# Patient Record
Sex: Male | Born: 1940
Health system: Southern US, Community
[De-identification: ages and names within clinical notes are randomized; demographics above are authoritative.]

## PROBLEM LIST (undated history)

## (undated) DIAGNOSIS — Z9889 Other specified postprocedural states: Secondary | ICD-10-CM

## (undated) DIAGNOSIS — C801 Malignant (primary) neoplasm, unspecified: Secondary | ICD-10-CM

## (undated) DIAGNOSIS — M5030 Other cervical disc degeneration, unspecified cervical region: Secondary | ICD-10-CM

## (undated) DIAGNOSIS — R112 Nausea with vomiting, unspecified: Secondary | ICD-10-CM

## (undated) DIAGNOSIS — E785 Hyperlipidemia, unspecified: Secondary | ICD-10-CM

## (undated) DIAGNOSIS — E039 Hypothyroidism, unspecified: Secondary | ICD-10-CM

## (undated) HISTORY — DX: Hypothyroidism, unspecified: E03.9

## (undated) HISTORY — DX: Other cervical disc degeneration, unspecified cervical region: M50.30

## (undated) HISTORY — DX: Hyperlipidemia, unspecified: E78.5

## (undated) HISTORY — PX: TONSILLECTOMY: SUR1361

---
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
## 2007-12-18 ENCOUNTER — Encounter: Payer: Self-pay | Admitting: Internal Medicine

## 2008-03-03 ENCOUNTER — Ambulatory Visit: Payer: Self-pay | Admitting: Internal Medicine

## 2008-03-03 DIAGNOSIS — E785 Hyperlipidemia, unspecified: Secondary | ICD-10-CM | POA: Insufficient documentation

## 2008-03-03 DIAGNOSIS — J309 Allergic rhinitis, unspecified: Secondary | ICD-10-CM | POA: Insufficient documentation

## 2008-04-14 ENCOUNTER — Ambulatory Visit: Payer: Self-pay | Admitting: Internal Medicine

## 2008-04-14 LAB — CONVERTED CEMR LAB
HDL goal, serum: 40 mg/dL
LDL Goal: 160 mg/dL

## 2008-06-16 ENCOUNTER — Ambulatory Visit: Payer: Self-pay | Admitting: Internal Medicine

## 2008-10-15 ENCOUNTER — Ambulatory Visit: Payer: Self-pay | Admitting: Internal Medicine

## 2008-10-15 DIAGNOSIS — E039 Hypothyroidism, unspecified: Secondary | ICD-10-CM

## 2008-10-15 LAB — CONVERTED CEMR LAB
ALT: 25 units/L (ref 0–53)
AST: 24 units/L (ref 0–37)
Alkaline Phosphatase: 41 units/L (ref 39–117)
Bilirubin, Direct: 0.2 mg/dL (ref 0.0–0.3)
CO2: 28 meq/L (ref 19–32)
Chloride: 108 meq/L (ref 96–112)
LDL Cholesterol: 80 mg/dL (ref 0–99)
Potassium: 4.4 meq/L (ref 3.5–5.1)
Sodium: 143 meq/L (ref 135–145)
Total Bilirubin: 1.1 mg/dL (ref 0.3–1.2)
Total CHOL/HDL Ratio: 4
Total Protein: 6.8 g/dL (ref 6.0–8.3)
Triglycerides: 98 mg/dL (ref 0.0–149.0)

## 2008-10-17 ENCOUNTER — Telehealth: Payer: Self-pay | Admitting: Internal Medicine

## 2008-12-04 ENCOUNTER — Ambulatory Visit: Payer: Self-pay | Admitting: Internal Medicine

## 2008-12-04 LAB — CONVERTED CEMR LAB: Cholesterol, target level: 200 mg/dL

## 2009-02-02 ENCOUNTER — Telehealth: Payer: Self-pay | Admitting: Internal Medicine

## 2009-02-23 ENCOUNTER — Encounter: Payer: Self-pay | Admitting: Internal Medicine

## 2009-02-23 ENCOUNTER — Ambulatory Visit: Payer: Self-pay | Admitting: Internal Medicine

## 2009-02-23 LAB — CONVERTED CEMR LAB
Inflenza A Ag: NEGATIVE
Influenza B Ag: NEGATIVE

## 2009-03-04 ENCOUNTER — Ambulatory Visit: Payer: Self-pay | Admitting: Internal Medicine

## 2009-04-15 ENCOUNTER — Telehealth: Payer: Self-pay | Admitting: Internal Medicine

## 2009-04-20 ENCOUNTER — Ambulatory Visit: Payer: Self-pay | Admitting: Internal Medicine

## 2009-04-20 LAB — CONVERTED CEMR LAB
Albumin: 3.7 g/dL (ref 3.5–5.2)
BUN: 10 mg/dL (ref 6–23)
Calcium: 8.6 mg/dL (ref 8.4–10.5)
Cholesterol: 128 mg/dL (ref 0–200)
Creatinine, Ser: 1 mg/dL (ref 0.4–1.5)
GFR calc non Af Amer: 79.08 mL/min (ref >60)
Glucose, Bld: 100 mg/dL — ABNORMAL HIGH (ref 70–99)
HDL: 34.7 mg/dL — ABNORMAL LOW (ref >39.00)
TSH: 1.66 microintl units/mL (ref 0.35–5.50)
Total Bilirubin: 1 mg/dL (ref 0.3–1.2)
Triglycerides: 117 mg/dL (ref 0.0–149.0)
VLDL: 23.4 mg/dL (ref 0.0–40.0)

## 2009-10-06 ENCOUNTER — Ambulatory Visit: Payer: Self-pay | Admitting: Internal Medicine

## 2009-10-06 DIAGNOSIS — B354 Tinea corporis: Secondary | ICD-10-CM | POA: Insufficient documentation

## 2009-10-06 DIAGNOSIS — M169 Osteoarthritis of hip, unspecified: Secondary | ICD-10-CM | POA: Insufficient documentation

## 2009-11-10 ENCOUNTER — Ambulatory Visit: Payer: Self-pay | Admitting: Internal Medicine

## 2009-11-10 LAB — CONVERTED CEMR LAB
Albumin: 3.8 g/dL (ref 3.5–5.2)
Basophils Absolute: 0.1 10*3/uL (ref 0.0–0.1)
CO2: 25 meq/L (ref 19–32)
Calcium: 8.9 mg/dL (ref 8.4–10.5)
Cholesterol: 174 mg/dL (ref 0–200)
Creatinine, Ser: 1 mg/dL (ref 0.4–1.5)
HCT: 44.8 % (ref 39.0–52.0)
HDL: 38.2 mg/dL — ABNORMAL LOW (ref >39.00)
Hemoglobin: 15.2 g/dL (ref 13.0–17.0)
Lymphs Abs: 1.9 10*3/uL (ref 0.7–4.0)
MCHC: 34 g/dL (ref 30.0–36.0)
MCV: 92.7 fL (ref 78.0–100.0)
Monocytes Absolute: 0.6 10*3/uL (ref 0.1–1.0)
Monocytes Relative: 7.1 % (ref 3.0–12.0)
Neutro Abs: 5.7 10*3/uL (ref 1.4–7.7)
PSA, Free Pct: 17 — ABNORMAL LOW (ref >25)
RDW: 14.1 % (ref 11.5–14.6)
Total CHOL/HDL Ratio: 5
Total Protein: 6.5 g/dL (ref 6.0–8.3)
VLDL: 20.6 mg/dL (ref 0.0–40.0)

## 2009-12-17 ENCOUNTER — Telehealth: Payer: Self-pay | Admitting: Internal Medicine

## 2010-02-09 ENCOUNTER — Encounter (INDEPENDENT_AMBULATORY_CARE_PROVIDER_SITE_OTHER): Payer: Self-pay | Admitting: *Deleted

## 2010-02-09 ENCOUNTER — Ambulatory Visit
Admission: RE | Admit: 2010-02-09 | Discharge: 2010-02-09 | Payer: Self-pay | Source: Home / Self Care | Attending: Gastroenterology | Admitting: Gastroenterology

## 2010-02-23 ENCOUNTER — Other Ambulatory Visit: Payer: Self-pay | Admitting: Gastroenterology

## 2010-02-23 ENCOUNTER — Ambulatory Visit
Admission: RE | Admit: 2010-02-23 | Discharge: 2010-02-23 | Payer: Self-pay | Source: Home / Self Care | Attending: Gastroenterology | Admitting: Gastroenterology

## 2010-02-23 LAB — HM COLONOSCOPY

## 2010-03-02 ENCOUNTER — Encounter: Payer: Self-pay | Admitting: Gastroenterology

## 2010-03-11 NOTE — Procedures (Addendum)
Summary: Colonoscopy  Patient: Keith [name] Note: All result statuses are Final unless otherwise noted.  Tests: (1) Colonoscopy (COL)   COL Colonoscopy           DONE     [facility]     [address]     [place], [place]  [address]           COLONOSCOPY PROCEDURE REPORT           PATIENT:  Keith, [name]  MR#:  [id number]     BIRTHDATE:  [birth year], 69 yrs. old  GENDER:  male     ENDOSCOPIST:  Judie Petit T. Russella Dar, MD, [facility]     Referred by:  [name], M.D.     PROCEDURE DATE:  02/23/2010     PROCEDURE:  Colonoscopy with snare polypectomy     ASA CLASS:  Class II     INDICATIONS:  1) Routine Risk Screening     MEDICATIONS:   Fentanyl 50 mcg IV, Versed 5 mg IV     DESCRIPTION OF PROCEDURE:   After the risks benefits and     alternatives of the procedure were thoroughly explained, informed     consent was obtained.  Digital rectal exam was performed and     revealed no abnormalities.   The LB [id number]-[id number] endoscope     was introduced through the anus and advanced to the cecum, which     was identified by both the appendix and ileocecal valve, without     limitations.  The quality of the prep was excellent, using     MoviPrep.  The instrument was then slowly withdrawn as the colon     was fully examined.     <<PROCEDUREIMAGES>>     FINDINGS:  A sessile polyp was found in the rectum. It was 5 mm in     size. Polyp was snared without cautery. Retrieval was successful.     Moderate diverticulosis was found in the sigmoid colon. A normal     appearing cecum, ileocecal valve, and appendiceal orifice were     identified. The ascending, hepatic flexure, transverse, splenic     flexure, descending appeared unremarkable. Retroflexed views in     the rectum revealed no abnormalities. The time to cecum =  5.25     minutes. The scope was then withdrawn (time =  9.75  min) from the     patient and the procedure completed.           COMPLICATIONS:  None        ENDOSCOPIC IMPRESSION:     1) 5 mm sessile polyp in the rectum     2) Moderate diverticulosis in the sigmoid colon           RECOMMENDATIONS:     1) Await pathology results     2) High fiber diet with liberal fluid intake.     3) If the polyp is adenomatous (pre-cancerous), colonoscopy in 5     years. Otherwise follow colorectal cancer screening guidelines for     "routine risk" patients with colonoscopy in 10 years.           Venita Lick. Russella Dar, MD, [facility]           n.     eSIGNED:   Venita Lick. Stark at 02/23/2010 12:08 PM           [place], [phone]  Note: An exclamation mark (!) indicates a result  that was not dispersed into the flowsheet. Document Creation Date: 02/23/2010 12:09 PM _______________________________________________________________________  (1) Order result status: Final Collection or observation date-time: 02/23/2010 12:04 Requested date-time:  Receipt date-time:  Reported date-time:  Referring Physician:   Ordering Physician: Claudette Head [phone]) Specimen Source:  Source: Launa Grill Order Number: [id number] Lab site:   Appended Document: Colonoscopy     Procedures Next Due Date:    Colonoscopy: 02/2015

## 2010-03-11 NOTE — Progress Notes (Signed)
Summary: request for rx  Phone Note Call from Patient   Summary of Call: pt requesting rx for Zpak for "stomach virus". Pt c/o fever, vomiting, diarrhea since last night. Pt stated he took OTC Pepto, no relief, also taking Tylenol. Please advise Initial call taken by: Sydell Axon  April 15, 2009 12:33 PM  Follow-up for Phone Call        not zpak but will for cipro, done Follow-up by: [name] MD,  April 15, 2009 12:51 PM  Additional Follow-up for Phone Call Additional follow up Details #1::        Pt informed  Additional Follow-up by: Lamar Sprinkles, CMA,  April 15, 2009 1:54 PM    New/Updated Medications: CIPRO 500 MG TAB (CIPROFLOXACIN HCL) Take 1 tablet by mouth morning and night X 10 days Prescriptions: CIPRO 500 MG TAB (CIPROFLOXACIN HCL) Take 1 tablet by mouth morning and night X 10 days  #20 x 1   Entered and Authorized by:   [name] MD   Signed by:   [name] MD on 04/15/2009   Method used:   Electronically to        [facility]  [address] # [id number]* (retail)       [address]       [place], [place]  [address]       Ph: [phone] or [phone]       Fax: [phone]   RxID:   [id number]

## 2010-03-11 NOTE — Letter (Signed)
Summary: Results Follow-up Letter  [facility]  [address] [place], [place] [address]   Phone: [phone]  Fax: [phone]    04/20/2009  [address] [place], [place]  [address]  Dear Mr. COOLMAN,   The following are the results of your recent test(s):  Test     Result     Kidney/liver   normal Thyroid     normal   _________________________________________________________  Please call for an appointment as directed _________________________________________________________ _________________________________________________________ _________________________________________________________  Sincerely,  Sanda Linger MD [facility]

## 2010-03-11 NOTE — Progress Notes (Signed)
  Phone Note Refill Request Message from:  Fax from Pharmacy on December 17, 2009 10:22 AM  Refills Requested: Medication #1:  SYNTHROID 125 MCG TABS Take 1 tablet by mouth once a day   Dosage confirmed as above?Dosage Confirmed   Supply Requested: 3 months Initial call taken by: Rock Nephew CMA,  December 17, 2009 10:22 AM    Prescriptions: SYNTHROID 125 MCG TABS (LEVOTHYROXINE SODIUM) Take 1 tablet by mouth once a day  #90 x 2   Entered by:   Rock Nephew CMA   Authorized by:   [name] MD   Signed by:   Rock Nephew CMA on 12/17/2009   Method used:   Faxed to ...       [facility] (mail-order)             , [place]         Ph: [phone]       Fax: [phone]   RxID:   [id number]

## 2010-03-11 NOTE — Assessment & Plan Note (Signed)
Summary: f/u appt//#/cd   Vital Signs:  Patient profile:   70 year old male Height:      69 inches Weight:      198 pounds BMI:     29.35 O2 Sat:      97 % on Room air Temp:     97.1 degrees F oral Pulse rate:   60 / minute Pulse rhythm:   regular Resp:     16 per minute BP sitting:   114 / 70  (left arm) Cuff size:   large  Vitals Entered By: Rock Nephew CMA (October 06, 2009 11:30 AM)  Nutrition Counseling: Patient's BMI is greater than 25 and therefore counseled on weight management options.  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 The pain rarely radiates into his RLE but he does not have N/W/T in his legs.  Also, he has a rash around his anus and buttocks.  Preventive Screening-Counseling & Management  Alcohol-Tobacco     Alcohol drinks/day: 0     Smoking Status: never     Passive Smoke Exposure: yes     Tobacco Counseling: not indicated; no tobacco use  Hep-HIV-STD-Contraception     Hepatitis Risk: no risk noted     HIV Risk: no     STD Risk: no risk noted  Medications Prior to Update: 1)  Synthroid 125 Mcg Tabs (Levothyroxine Sodium) .... Take 1 Tablet By Mouth Once A Day 2)  Multivitamin 3)  Veramyst 27.5 Mcg/spray Susp (Fluticasone Furoate) .... 2 Puffs Each Nostril Once Daily 4)  Omega-3 Cf 1000 Mg Caps (Omega-3 Fatty Acids) .... Qg 5)  Aspirin Low Dose 81 Mg Tbec (Aspirin) .... Once Daily 6)  Guiatuss Ac 100-10 Mg/32ml Syrp (Guaifenesin-Codeine) .... 5-10 Ml By Mouth Qid As Needed For Cough 7)  Red Yeast Rice 600 Mg Tabs (Red Yeast Rice Extract)  Current Medications (verified): 1)  Synthroid 125 Mcg Tabs (Levothyroxine Sodium) .... Take 1 Tablet By Mouth Once A Day 2)  Multivitamin 3)  Veramyst 27.5 Mcg/spray Susp (Fluticasone Furoate) .... 2 Puffs Each Nostril Once Daily 4)  Omega-3 Cf 1000 Mg  Caps (Omega-3 Fatty Acids) .... Qg 5)  Aspirin Low Dose 81 Mg Tbec (Aspirin) .... Once Daily 6)  Guiatuss Ac 100-10 Mg/41ml Syrp (Guaifenesin-Codeine) .... 5-10 Ml By Mouth Qid As Needed For Cough 7)  Red Yeast Rice 600 Mg Tabs (Red Yeast Rice Extract) 8)  Vimovo 500-20 Mg Tbec (Naproxen-Esomeprazole) .... One By Mouth Two Times A Day As Needed For Hip Pain 9)  Ciclopirox 0.77 % Gel (Ciclopirox) .... Apply To Aa Two Times A Day For 14 Days  Allergies (verified): 1)  ! Crestor (Rosuvastatin Calcium)  Past History:  Past Medical History: Last updated: 10/15/2008 Allergic rhinitis Hyperlipidemia DDD in C spine Hypothyroidism  Past Surgical History: Last updated: 03/03/2008 Tonsillectomy  Family History: Last updated: 03/03/2008 Family History Breast cancer 1st degree relative <50 Family History of CAD Male 1st degree relative <50 Diabetic Father had a Fatal MI         at 10 yoa Family History Diabetes 1st degree relative  Social History: Last updated: 03/03/2008 Occupation: Retired Technical sales engineer, now drives a school bus full time. Married Never Smoked Alcohol use-no Drug use-no Regular exercise-yes  Risk Factors: Alcohol Use: 0 (10/06/2009) Exercise: yes (03/03/2008)  Risk Factors: Smoking Status: never (10/06/2009) Passive Smoke Exposure: yes (10/06/2009)  Family History: Reviewed history from 03/03/2008 and no changes required. Family History Breast cancer 1st degree relative <50 Family History of CAD Male 1st degree relative <50 Diabetic Father had a Fatal MI         at 58 yoa Family History Diabetes 1st degree relative  Social History: Reviewed history from 03/03/2008 and no changes required. 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   MS:  Complains of joint pain and stiffness; denies joint redness, joint swelling, loss of strength, low back pain, mid back pain, muscle weakness, and thoracic pain. Derm:  Complains of itching and rash; denies changes in nail beds, dryness, flushing, hair loss, and lesion(s).  Physical Exam  General:  alert, well-developed, well-nourished, well-hydrated, and overweight-appearing.   Head:  normocephalic, atraumatic, no abnormalities observed, and no abnormalities palpated.   Neck:  supple, full ROM, and no masses.   Lungs:  normal respiratory effort, no intercostal retractions, no accessory muscle use, normal breath sounds, no dullness, no fremitus, no crackles, and no wheezes.   Heart:  normal rate, regular rhythm, no murmur, no gallop, no rub, and no JVD.   Abdomen:  soft, non-tender, normal bowel sounds, no distention, no masses, no guarding, no rigidity, no hepatomegaly, and no splenomegaly.   Msk:  normal ROM, no joint tenderness, no joint swelling, no joint warmth, no redness over joints, no joint deformities, no joint instability, no crepitation, and no muscle atrophy.   Pulses:  R and L carotid,radial,femoral,dorsalis pedis and posterior tibial pulses are full and equal bilaterally Neurologic:  alert & oriented X3, cranial nerves II-XII intact, strength normal in all extremities, and gait normal.   Skin:  he has mild erythema and scaling circumferentially around his anus and on to the buttocks. there are no vesicles, pustules, streaking, induration, ttp, excoriations, Cervical Nodes:  no anterior cervical adenopathy and no posterior cervical adenopathy.   Axillary Nodes:  no R axillary adenopathy and no L axillary adenopathy.   Inguinal Nodes:  no R inguinal adenopathy and no L inguinal adenopathy.   Psych:  Cognition and judgment appear intact. Alert and cooperative with normal attention span and concentration. No apparent delusions, illusions,  hallucinations   Detailed Back/Spine Exam  General:    Well-developed, well-nourished, in no acute distress; alert and oriented x 3.    Gait:    Normal heel-toe gait pattern bilaterally.    Lumbosacral Exam:  Inspection-deformity:    Normal Palpation-spinal tenderness:  Normal Range of Motion:    Forward Flexion:   90 degrees    Hyperextension:   30 degrees    Right Lateral Bend:   35 degrees    Left Lateral Bend:   35 degrees Lying Straight Leg Raise:    Right:  negative    Left:  negative Sitting Straight Leg Raise:    Right:  negative    Left:  negative Reverse Straight Leg Raise:    Right:  negative    Left:  negative Toe Walking:    Right:  normal    Left:  normal Heel Walking:    Right:  normal    Left:  normal   Impression & Recommendations:  Problem # 1:  SCIATICA, RIGHT (  ICD-724.3) Assessment New  will refer to pain mngt to see if he would benefit from an injection His updated medication list for this problem includes:    Aspirin Low Dose 81 Mg Tbec (Aspirin) ..... Once daily    Vimovo 500-20 Mg Tbec (Naproxen-esomeprazole) ..... One by mouth two times a day as needed for hip pain  Orders: Pain Clinic Referral (Pain)  Discussed use of moist heat or ice, modified activities, medications, and stretching/strengthening exercises. Back care instructions given. To be seen in 2 weeks if no improvement; sooner if worsening of symptoms.   Problem # 2:  TINEA CORPORIS (ICD-110.5) Assessment: New start loprox gel and follow  Complete Medication List: 1)  Synthroid 125 Mcg Tabs (Levothyroxine sodium) .... Take 1 tablet by mouth once a day 2)  Multivitamin  3)  Veramyst 27.5 Mcg/spray Susp (Fluticasone furoate) .... 2 puffs each nostril once daily 4)  Omega-3 Cf 1000 Mg Caps (Omega-3 fatty acids) .... Qg 5)  Aspirin Low Dose 81 Mg Tbec (Aspirin) .... Once daily 6)  Guiatuss Ac 100-10 Mg/78ml Syrp (Guaifenesin-codeine) .... 5-10 ml by mouth qid as needed for  cough 7)  Red Yeast Rice 600 Mg Tabs (Red yeast rice extract) 8)  Vimovo 500-20 Mg Tbec (Naproxen-esomeprazole) .... One by mouth two times a day as needed for hip pain 9)  Ciclopirox 0.77 % Gel (Ciclopirox) .... Apply to aa two times a day for 14 days  Other Orders: Admin 1st Vaccine ([address]) Flu Vaccine 53yrs + ([address])  Patient Instructions: 1)  Please schedule a follow-up appointment in 1 month. 2)  Take 650-1000mg  of Tylenol every 4-6 hours as needed for relief of pain or comfort of fever AVOID taking more than 4000mg   in a 24 hour period (can cause liver damage in higher doses). 3)  You may move around but avoid painful motions. Apply ice to sore area for 20 minutes 3-4 times a day for 2-3 days. Flu Vaccine Consent Questions     Do you have a history of severe allergic reactions to this vaccine? no    Any prior history of allergic reactions to egg and/or gelatin? no    Do you have a sensitivity to the preservative Thimersol? no    Do you have a past history of Guillan-Barre Syndrome? no    Do you currently have an acute febrile illness? no    Have you ever had a severe reaction to latex? no    Vaccine information given and explained to patient? yes    Are you currently pregnant? no    Lot Number:[id number]   Exp Date:08/07/2010   Site Given  Right Deltoid IMPrescriptions: CICLOPIROX 0.77 % GEL (CICLOPIROX) Apply to AA two times a day for 14 days  #60 gms x 3   Entered and Authorized by:   [name] MD   Signed by:   [name] MD on 10/06/2009   Method used:   Electronically to        [facility]  [address] # [facility]* (retail)       [address]       [place], [place]  [address]       Ph: [phone] or [phone]       Fax: [phone]   RxID:   [id number] VIMOVO 500-20 MG TBEC (NAPROXEN-ESOMEPRAZOLE) One by mouth two times a day as needed for hip pain  #18 x 0   Entered and Authorized  by:   [name] MD   Signed by:   [name]  MD on 10/06/2009   Method used:   Samples Given   RxID:   [id number]      .lbflu

## 2010-03-11 NOTE — Assessment & Plan Note (Signed)
Summary: FU---STC  #   Vital Signs:  Patient profile:   70 year old male Height:      69 inches Weight:      195 pounds BMI:     28.90 O2 Sat:      96 % on Room air Temp:     98.7 degrees F oral Pulse rate:   56 / minute Pulse rhythm:   regular Resp:     16 per minute BP sitting:   118 / 84  (left arm) Cuff size:   large  Vitals Entered By: Rock Nephew CMA (April 20, 2009 11:33 AM)  Nutrition Counseling: Patient's BMI is greater than 25 and therefore counseled on weight management options.  O2 Flow:  Room air  Primary Care Provider:  [name] MD   History of Present Illness: He returns for a f/up on lipids. He has lost weight with diet and exercise. He has had no more myalgias or arthralgias. He recently had  a stomach and is much  improved. He had cereal with milk this morning.  Preventive Screening-Counseling & Management  Alcohol-Tobacco     Alcohol drinks/day: 0     Smoking Status: never     Passive Smoke Exposure: yes  Hep-HIV-STD-Contraception     Hepatitis Risk: no risk noted     HIV Risk: no     STD Risk: no risk noted      Sexual History:  currently monogamous.        Drug Use:  no.        Blood Transfusions:  no.    Clinical Review Panels:  Lipid Management   Cholesterol:  136 (10/15/2008)   LDL (bad choesterol):  80 (10/15/2008)   HDL (good cholesterol):  36.10 (10/15/2008)  Diabetes Management   Creatinine:  1.1 (10/15/2008)  Complete Metabolic Panel   Glucose:  100 (10/15/2008)   Sodium:  143 (10/15/2008)   Potassium:  4.4 (10/15/2008)   Chloride:  108 (10/15/2008)   CO2:  28 (10/15/2008)   BUN:  14 (10/15/2008)   Creatinine:  1.1 (10/15/2008)   Albumin:  3.7 (10/15/2008)   Total Protein:  6.8 (10/15/2008)   Calcium:  8.8 (10/15/2008)   Total Bili:  1.1 (10/15/2008)   Alk Phos:  41 (10/15/2008)   SGPT (ALT):  25 (10/15/2008)   SGOT (AST):  24 (10/15/2008)   Current Medications (verified): 1)  Synthroid 125 Mcg Tabs  (Levothyroxine Sodium) .... Take 1 Tablet By Mouth Once A Day 2)  Multivitamin 3)  Veramyst 27.5 Mcg/spray Susp (Fluticasone Furoate) .... 2 Puffs Each Nostril Once Daily 4)  Omega-3 Cf 1000 Mg Caps (Omega-3 Fatty Acids) .... Qg 5)  Aspirin Low Dose 81 Mg Tbec (Aspirin) .... Once Daily 6)  Guiatuss Ac 100-10 Mg/40ml Syrp (Guaifenesin-Codeine) .... 5-10 Ml By Mouth Qid As Needed For Cough 7)  Cipro 500 Mg Tab (Ciprofloxacin Hcl) .... Take 1 Tablet By Mouth Morning and Night X 10 Days 8)  Red Yeast Rice 600 Mg Tabs (Red Yeast Rice Extract)  Allergies (verified): 1)  ! Crestor (Rosuvastatin Calcium)  Social History: Hepatitis Risk:  no risk noted STD Risk:  no risk noted Sexual History:  currently monogamous Blood Transfusions:  no  Review of Systems  The patient denies anorexia, fever, weight loss, weight gain, chest pain, syncope, dyspnea on exertion, peripheral edema, prolonged cough, headaches, hemoptysis, abdominal pain, muscle weakness, suspicious skin lesions, and difficulty walking.   Endo:  Denies cold intolerance, excessive hunger, excessive  thirst, excessive urination, heat intolerance, polyuria, and weight change.  Physical Exam  General:  alert, well-developed, well-nourished, well-hydrated, and overweight-appearing.   Eyes:  no icterus Mouth:  good dentition, pharynx pink and moist, no erythema, no exudates, no posterior lymphoid hypertrophy, no pharyngeal crowing, no lesions, no aphthous ulcers, no leukoplakia, and no petechiae.   Neck:  supple, full ROM, no masses, no thyromegaly, no thyroid nodules or tenderness, no JVD, no carotid bruits, no cervical lymphadenopathy, and no neck tenderness.   Lungs:  normal respiratory effort, no intercostal retractions, no accessory muscle use, normal breath sounds, no dullness, no fremitus, no crackles, and no wheezes.   Heart:  normal rate, regular rhythm, no murmur, no gallop, no rub, and no JVD.   Abdomen:  soft, non-tender, normal  bowel sounds, no distention, no masses, no guarding, no rigidity, no hepatomegaly, and no splenomegaly.   Msk:  normal ROM, no joint tenderness, and no joint swelling.   Pulses:  R and L carotid,radial,femoral,dorsalis pedis and posterior tibial pulses are full and equal bilaterally Extremities:  No clubbing, cyanosis, edema, or deformity noted with normal full range of motion of all joints.   Neurologic:  alert & oriented X3, cranial nerves II-XII intact, strength normal in all extremities, and gait normal.   Skin:  Intact without suspicious lesions or rashes Psych:  Cognition and judgment appear intact. Alert and cooperative with normal attention span and concentration. No apparent delusions, illusions, hallucinations   Impression & Recommendations:  Problem # 1:  HYPERLIPIDEMIA (ICD-272.4) Assessment Unchanged  Orders: Venipuncture ([address]) TLB-Lipid Panel (80061-LIPID) TLB-BMP (Basic Metabolic Panel-BMET) (80048-METABOL) TLB-Hepatic/Liver Function Pnl (80076-HEPATIC) TLB-TSH (Thyroid Stimulating Hormone) (84443-TSH)  Labs Reviewed: SGOT: 24 (10/15/2008)   SGPT: 25 (10/15/2008)  Lipid Goals: Chol Goal: 200 (12/04/2008)   HDL Goal: 40 (12/04/2008)   LDL Goal: 130 (12/04/2008)   TG Goal: 150 (12/04/2008)  Prior 10 Yr Risk Heart Disease: 7 % (12/04/2008)   HDL:36.10 (10/15/2008)  LDL:80 (10/15/2008)  Chol:136 (10/15/2008)  Trig:98.0 (10/15/2008)  Problem # 2:  HYPOTHYROIDISM (ICD-244.9) Assessment: Unchanged  His updated medication list for this problem includes:    Synthroid 125 Mcg Tabs (Levothyroxine sodium) .Marland Kitchen... Take 1 tablet by mouth once a day  Orders: Venipuncture ([address]) TLB-Lipid Panel (80061-LIPID) TLB-BMP (Basic Metabolic Panel-BMET) (80048-METABOL) TLB-Hepatic/Liver Function Pnl (80076-HEPATIC) TLB-TSH (Thyroid Stimulating Hormone) (84443-TSH)  Labs Reviewed: TSH: 1.47 (10/15/2008)    Chol: 136 (10/15/2008)   HDL: 36.10 (10/15/2008)   LDL: 80 (10/15/2008)    TG: 98.0 (10/15/2008)  Complete Medication List: 1)  Synthroid 125 Mcg Tabs (Levothyroxine sodium) .... Take 1 tablet by mouth once a day 2)  Multivitamin  3)  Veramyst 27.5 Mcg/spray Susp (Fluticasone furoate) .... 2 puffs each nostril once daily 4)  Omega-3 Cf 1000 Mg Caps (Omega-3 fatty acids) .... Qg 5)  Aspirin Low Dose 81 Mg Tbec (Aspirin) .... Once daily 6)  Guiatuss Ac 100-10 Mg/78ml Syrp (Guaifenesin-codeine) .... 5-10 ml by mouth qid as needed for cough 7)  Cipro 500 Mg Tab (Ciprofloxacin hcl) .... Take 1 tablet by mouth morning and night x 10 days 8)  Red Yeast Rice 600 Mg Tabs (Red yeast rice extract)  Patient Instructions: 1)  Please schedule a follow-up appointment in 2 months. 2)  It is important that you exercise regularly at least 20 minutes 5 times a week. If you develop chest pain, have severe difficulty breathing, or feel very tired , stop exercising immediately and seek medical attention. 3)  You need to lose  weight. Consider a lower calorie diet and regular exercise.

## 2010-03-11 NOTE — Letter (Signed)
Summary: Keith [name] Instructions  Keith [name]  [address] [place], [place] [address]   Phone: [phone]  Fax: [phone]       Keith [name]    10/31/1940    MRN: [id number]        Procedure Day Keith [name]:  Keith [name]  02/23/10     Arrival Time:  10:30AM     Procedure Time:  11:30AM     Location of Procedure:                    [facility]X [facility]  PREPARATION FOR COLONOSCOPY WITH MOVIPREP   Starting 5 days prior to your procedure 02/18/09 do not eat nuts, seeds, popcorn, corn, beans, peas,  salads, or any raw vegetables.  Do not take any fiber supplements (e.g. Metamucil, Citrucel, and Benefiber).  THE DAY BEFORE YOUR PROCEDURE         DATE: 02/22/10  DAY: MONDAY  1.  Drink clear liquids the entire day-NO SOLID FOOD  2.  Do not drink anything colored red or purple.  Avoid juices with pulp.  No orange juice.  3.  Drink at least 64 oz. (8 glasses) of fluid/clear liquids during the day to prevent dehydration and help the prep work efficiently.  CLEAR LIQUIDS INCLUDE: Water Jello Ice Popsicles Tea (sugar ok, no milk/cream) Powdered fruit flavored drinks Coffee (sugar ok, no milk/cream) Gatorade Juice: apple, white grape, white cranberry  Lemonade Clear bullion, consomm, broth Carbonated beverages (any kind) Strained chicken noodle soup Hard Candy                             4.  In the morning, mix first dose of MoviPrep solution:    Empty 1 Pouch A and 1 Pouch B into the disposable container    Add lukewarm drinking water to the top line of the container. Mix to dissolve    Refrigerate (mixed solution should be used within 24 hrs)  5.  Begin drinking the prep at 5:00 p.m. The MoviPrep container is divided by 4 marks.   Every 15 minutes drink the solution down to the next mark (approximately 8 oz) until the full liter is complete.   6.  Follow completed prep with 16 oz of clear liquid of your choice (Nothing red or purple).   Continue to drink clear liquids until bedtime.  7.  Before going to bed, mix second dose of MoviPrep solution:    Empty 1 Pouch A and 1 Pouch B into the disposable container    Add lukewarm drinking water to the top line of the container. Mix to dissolve    Refrigerate  THE DAY OF YOUR PROCEDURE      DATE: 02/23/10   DAY:  TUESDAY  Beginning at 6;30AM (5 hours before procedure):         1. Every 15 minutes, drink the solution down to the next mark (approx 8 oz) until the full liter is complete.  2. Follow completed prep with 16 oz. of clear liquid of your choice.    3. You may drink clear liquids until 9:30AM (2 HOURS BEFORE PROCEDURE).   MEDICATION INSTRUCTIONS  Unless otherwise instructed, you should take regular prescription medications with a small sip of water   as early as possible the morning of your procedure.        OTHER INSTRUCTIONS  You will need a responsible adult at  least 69 years of age to accompany you and drive you home.   This person must remain in the waiting room during your procedure.  Wear loose fitting clothing that is easily removed.  Leave jewelry and other valuables at home.  However, you may wish to bring a book to read or  an iPod/MP3 player to listen to music as you wait for your procedure to start.  Remove all body piercing jewelry and leave at home.  Total time from sign-in until discharge is approximately 2-3 hours.  You should go home directly after your procedure and rest.  You can resume normal activities the  day after your procedure.  The day of your procedure you should not:   Drive   Make legal decisions   Operate machinery   Drink alcohol   Return to work  You will receive specific instructions about eating, activities and medications before you leave.    The above instructions have been reviewed and explained to me by   Wyona Almas RN  February 09, 2010 4:49 PM     I fully understand and can verbalize these  instructions _____________________________ Date _________

## 2010-03-11 NOTE — Assessment & Plan Note (Signed)
Summary: FU Keith [name]  #   Vital Signs:  Patient profile:   70 year old male Height:      69 inches Weight:      195 pounds O2 Sat:      96 % on Room air Temp:     97.7 degrees F oral Pulse rate:   62 / minute Pulse rhythm:   regular Resp:     16 per minute BP sitting:   122 / 80  (left arm) Cuff size:   large  Vitals Entered By: Rock Nephew CMA (November 10, 2009 11:27 AM)  O2 Flow:  Room air CC: follow-up visit Is Patient Diabetic? No Pain Assessment Patient in pain? no      Nutritional Status BMI of 19 -24 = normal  Vision Screening:Left eye w/o correction: 20 / 25 Right Eye w/o correction: 20 / 25  Color vision testing: normal       Primary Care Provider:  [name] MD  CC:  follow-up visit.  History of Present Illness: Here for Medicare AWV:  1.   Risk factors based on Past M, S, F history: yes, see notes 2.   Physical Activities: very active, still works fulltime 3.   Depression/mood: very good mood 4.   Hearing: hears a whispered voice at 3 feet 5.   ADL's: complete and independent 6.   Fall Risk: not noted 7.   Home Safety: very good 8.   Height, weight, &visual acuity: yes 9.   Counseling: yes ,done 10.   Labs ordered based on risk factors: yes 11.           Referral Coordination: yes, done 12.           Care Plan: see notes 13.            Cognitive Assessment : he is very sharp, he asks intelligent questions and answers all questions well  His perianal rash did not go away with topical treatment.  Preventive Screening-Counseling & Management  Alcohol-Tobacco     Alcohol drinks/day: 0     Smoking Status: never     Passive Smoke Exposure: yes     Tobacco Counseling: not indicated; no tobacco use  Hep-HIV-STD-Contraception     Hepatitis Risk: no risk noted     HIV Risk: no     STD Risk: no risk noted     TSE monthly: yes     Testicular SE Education/Counseling to perform regular STE     Sun Exposure-Excessive: yes     Sun Exposure  Counseling: not indicated; sun exposure is acceptable  Safety-Violence-Falls     Seat Belt Use: yes     Helmet Use: n/a     Firearms in the Home: no firearms in the home     Smoke Detectors: yes     Violence in the Home: no risk noted     Sexual Abuse: no      Sexual History:  currently monogamous.        Drug Use:  never.        Blood Transfusions:  no.    Clinical Review Panels:  Immunizations   Last Flu Vaccine:  Fluvax 3+ (10/06/2009)  Lipid Management   Cholesterol:  128 (04/20/2009)   LDL (bad choesterol):  70 (04/20/2009)   HDL (good cholesterol):  34.70 (04/20/2009)  Diabetes Management   Creatinine:  1.0 (04/20/2009)   Last Flu Vaccine:  Fluvax 3+ (10/06/2009)  Complete Metabolic Panel  Glucose:  100 (04/20/2009)   Sodium:  142 (04/20/2009)   Potassium:  4.2 (04/20/2009)   Chloride:  108 (04/20/2009)   CO2:  28 (04/20/2009)   BUN:  10 (04/20/2009)   Creatinine:  1.0 (04/20/2009)   Albumin:  3.7 (04/20/2009)   Total Protein:  7.2 (04/20/2009)   Calcium:  8.6 (04/20/2009)   Total Bili:  1.0 (04/20/2009)   Alk Phos:  38 (04/20/2009)   SGPT (ALT):  32 (04/20/2009)   SGOT (AST):  31 (04/20/2009)   Medications Prior to Update: 1)  Synthroid 125 Mcg Tabs (Levothyroxine Sodium) .... Take 1 Tablet By Mouth Once A Day 2)  Multivitamin 3)  Veramyst 27.5 Mcg/spray Susp (Fluticasone Furoate) .... 2 Puffs Each Nostril Once Daily 4)  Omega-3 Cf 1000 Mg Caps (Omega-3 Fatty Acids) .... Qg 5)  Aspirin Low Dose 81 Mg Tbec (Aspirin) .... Once Daily 6)  Guiatuss Ac 100-10 Mg/25ml Syrp (Guaifenesin-Codeine) .... 5-10 Ml By Mouth Qid As Needed For Cough 7)  Red Yeast Rice 600 Mg Tabs (Red Yeast Rice Extract) 8)  Vimovo 500-20 Mg Tbec (Naproxen-Esomeprazole) .... One By Mouth Two Times A Day As Needed For Hip Pain 9)  Ciclopirox 0.77 % Gel (Ciclopirox) .... Apply To Aa Two Times A Day For 14 Days  Current Medications (verified): 1)  Synthroid 125 Mcg Tabs (Levothyroxine  Sodium) .... Take 1 Tablet By Mouth Once A Day 2)  Multivitamin 3)  Veramyst 27.5 Mcg/spray Susp (Fluticasone Furoate) .... 2 Puffs Each Nostril Once Daily 4)  Omega-3 Cf 1000 Mg Caps (Omega-3 Fatty Acids) .... Qg 5)  Aspirin Low Dose 81 Mg Tbec (Aspirin) .... Once Daily 6)  Guiatuss Ac 100-10 Mg/33ml Syrp (Guaifenesin-Codeine) .... 5-10 Ml By Mouth Qid As Needed For Cough 7)  Red Yeast Rice 600 Mg Tabs (Red Yeast Rice Extract) 8)  Vimovo 500-20 Mg Tbec (Naproxen-Esomeprazole) .... One By Mouth Two Times A Day As Needed For Hip Pain 9)  Fluconazole 200 Mg Tabs (Fluconazole) .... One By Mouth Once Daily For 10 Days  Allergies (verified): 1)  ! Crestor (Rosuvastatin Calcium)  Past History:  Past Medical History: Last updated: 10/15/2008 Allergic rhinitis Hyperlipidemia DDD in C spine Hypothyroidism  Past Surgical History: Last updated: 03/03/2008 Tonsillectomy  Family History: Last updated: 03/03/2008 Family History Breast cancer 1st degree relative <50 Family History of CAD Male 1st degree relative <50 Diabetic Father had a Fatal MI         at 10 yoa Family History Diabetes 1st degree relative  Social History: Last updated: 03/03/2008 Occupation: Retired Technical sales engineer, now drives a school bus full time. Married Never Smoked Alcohol use-no Drug use-no Regular exercise-yes  Risk Factors: Alcohol Use: 0 (11/10/2009) Exercise: yes (03/03/2008)  Risk Factors: Smoking Status: never (11/10/2009) Passive Smoke Exposure: yes (11/10/2009)  Family History: Reviewed history from 03/03/2008 and no changes required. Family History Breast cancer 1st degree relative <50 Family History of CAD Male 1st degree relative <50 Diabetic Father had a Fatal MI         at 37 yoa Family History Diabetes 1st degree relative  Social History: Reviewed history from 03/03/2008 and no changes required. Occupation: Retired Technical sales engineer, now drives a school bus full time. Married Never Smoked Alcohol  use-no Drug use-no Regular exercise-yes Sun Exposure-Excessive:  yes Seat Belt Use:  yes Drug Use:  never  Review of Systems  The patient denies anorexia, fever, weight loss, weight gain, chest pain, syncope, dyspnea on exertion, peripheral edema, prolonged cough, headaches, hemoptysis, abdominal  pain, melena, hematochezia, severe indigestion/heartburn, hematuria, genital sores, muscle weakness, suspicious skin lesions, transient blindness, difficulty walking, depression, enlarged lymph nodes, angioedema, and testicular masses.   Derm:  Complains of itching and rash; denies changes in color of skin, changes in nail beds, dryness, excessive perspiration, flushing, hair loss, lesion(s), and poor wound healing. Endo:  Denies cold intolerance, excessive hunger, excessive thirst, excessive urination, heat intolerance, polyuria, and weight change.  Physical Exam  General:  alert, well-developed, well-nourished, well-hydrated, appropriate dress, normal appearance, healthy-appearing, cooperative to examination, and good hygiene.   Head:  normocephalic, atraumatic, no abnormalities observed, and no abnormalities palpated.   Eyes:  No corneal or conjunctival inflammation noted. EOMI. Perrla. Funduscopic exam benign, without hemorrhages, exudates or papilledema. Vision grossly normal. Ears:  External ear exam shows no significant lesions or deformities.  Otoscopic examination reveals clear canals, tympanic membranes are intact bilaterally without bulging, retraction, inflammation or discharge. Hearing is grossly normal bilaterally. Mouth:  Oral mucosa and oropharynx without lesions or exudates.  Teeth in good repair. Neck:  supple, full ROM, no masses, no thyromegaly, no JVD, normal carotid upstroke, no carotid bruits, no cervical lymphadenopathy, and no neck tenderness.   Chest Wall:  No deformities, masses, tenderness or gynecomastia noted. Breasts:  No masses or gynecomastia noted Lungs:  normal  respiratory effort, no intercostal retractions, no accessory muscle use, normal breath sounds, no dullness, no fremitus, no crackles, and no wheezes.   Heart:  Normal rate and regular rhythm. S1 and S2 normal without gallop, murmur, click, rub or other extra sounds. Abdomen:  soft, non-tender, normal bowel sounds, no distention, no masses, no guarding, no rigidity, no rebound tenderness, no abdominal hernia, no inguinal hernia, no hepatomegaly, and no splenomegaly.   Rectal:  No external abnormalities noted. Normal sphincter tone. No rectal masses or tenderness. he has erythema and scale in the perianal area but no discreet lesions. stool positive for occult blood.   Genitalia:  circumcised, no hydrocele, no varicocele, no scrotal masses, no testicular masses or atrophy, no cutaneous lesions, and no urethral discharge.   Prostate:  no nodules, no asymmetry, no induration, and 1+ enlarged.   Msk:  No deformity or scoliosis noted of thoracic or lumbar spine.   Pulses:  R and L carotid,radial,femoral,dorsalis pedis and posterior tibial pulses are full and equal bilaterally Extremities:  No clubbing, cyanosis, edema, or deformity noted with normal full range of motion of all joints.   Neurologic:  No cranial nerve deficits noted. Station and gait are normal. Plantar reflexes are down-going bilaterally. DTRs are symmetrical throughout. Sensory, motor and coordinative functions appear intact. Skin:  turgor normal, no rashes, no suspicious lesions, no ecchymoses, no petechiae, no purpura, no ulcerations, no edema, excessive tan, and solar damage.   Cervical Nodes:  no anterior cervical adenopathy and no posterior cervical adenopathy.   Axillary Nodes:  no R axillary adenopathy and no L axillary adenopathy.   Inguinal Nodes:  no R inguinal adenopathy and no L inguinal adenopathy.   Psych:  Cognition and judgment appear intact. Alert and cooperative with normal attention span and concentration. No apparent  delusions, illusions, hallucinations   Impression & Recommendations:  Problem # 1:  ROUTINE GENERAL MEDICAL EXAM@HEALTH  CARE FACL (ICD-V70.0) Assessment New  Orders: Venipuncture ([address]) T-PSA Total ([address]) Gastroenterology Referral (GI) TLB-Lipid Panel (80061-LIPID) TLB-CBC Platelet - w/Differential (85025-CBCD) TLB-BMP (Basic Metabolic Panel-BMET) (80048-METABOL) TLB-Hepatic/Liver Function Pnl (80076-HEPATIC) TLB-TSH (Thyroid Stimulating Hormone) (84443-TSH) MC -Subsequent Annual Wellness Visit [phone])  Flu Vax: Fluvax 3+ (10/06/2009)  Chol: 128 (04/20/2009)   HDL: 34.70 (04/20/2009)   LDL: 70 (04/20/2009)   TG: 117.0 (04/20/2009) TSH: 1.66 (04/20/2009)    Discussed using sunscreen, use of alcohol, drug use, self testicular exam, routine dental care, routine eye care, routine physical exam, seat belts, multiple vitamins, and recommendations for immunizations.  Discussed exercise and checking cholesterol.  Also recommend checking PSA.  Problem # 2:  TINEA CORPORIS (ICD-110.5) Assessment: Deteriorated will try diflucan  Problem # 3:  HYPOTHYROIDISM (ICD-244.9) Assessment: Unchanged  His updated medication list for this problem includes:    Synthroid 125 Mcg Tabs (Levothyroxine sodium) .Marland Kitchen... Take 1 tablet by mouth once a day  Orders: Venipuncture ([address]) T-PSA Total ([address]) TLB-Lipid Panel (80061-LIPID) TLB-CBC Platelet - w/Differential (85025-CBCD) TLB-BMP (Basic Metabolic Panel-BMET) (80048-METABOL) TLB-Hepatic/Liver Function Pnl (80076-HEPATIC) TLB-TSH (Thyroid Stimulating Hormone) (84443-TSH)  Problem # 4:  SCIATICA, RIGHT (ICD-724.3) Assessment: Improved he has received great results from seeing a chiropractor His updated medication list for this problem includes:    Aspirin Low Dose 81 Mg Tbec (Aspirin) ..... Once daily    Vimovo 500-20 Mg Tbec (Naproxen-esomeprazole) ..... One by mouth two times a day as needed for hip pain  Problem # 5:   HYPERLIPIDEMIA (ICD-272.4) Assessment: Improved  Orders: Venipuncture ([address]) T-PSA Total ([address]) TLB-Lipid Panel (80061-LIPID) TLB-CBC Platelet - w/Differential (85025-CBCD) TLB-BMP (Basic Metabolic Panel-BMET) (80048-METABOL) TLB-Hepatic/Liver Function Pnl (80076-HEPATIC) TLB-TSH (Thyroid Stimulating Hormone) (84443-TSH)  Labs Reviewed: SGOT: 31 (04/20/2009)   SGPT: 32 (04/20/2009)  Lipid Goals: Chol Goal: 200 (12/04/2008)   HDL Goal: 40 (12/04/2008)   LDL Goal: 130 (12/04/2008)   TG Goal: 150 (12/04/2008)  Prior 10 Yr Risk Heart Disease: 7 % (12/04/2008)   HDL:34.70 (04/20/2009), 36.10 (10/15/2008)  LDL:70 (04/20/2009), 80 (10/15/2008)  Chol:128 (04/20/2009), 136 (10/15/2008)  Trig:117.0 (04/20/2009), 98.0 (10/15/2008)  Complete Medication List: 1)  Synthroid 125 Mcg Tabs (Levothyroxine sodium) .... Take 1 tablet by mouth once a day 2)  Multivitamin  3)  Veramyst 27.5 Mcg/spray Susp (Fluticasone furoate) .... 2 puffs each nostril once daily 4)  Omega-3 Cf 1000 Mg Caps (Omega-3 fatty acids) .... Qg 5)  Aspirin Low Dose 81 Mg Tbec (Aspirin) .... Once daily 6)  Guiatuss Ac 100-10 Mg/18ml Syrp (Guaifenesin-codeine) .... 5-10 ml by mouth qid as needed for cough 7)  Red Yeast Rice 600 Mg Tabs (Red yeast rice extract) 8)  Vimovo 500-20 Mg Tbec (Naproxen-esomeprazole) .... One by mouth two times a day as needed for hip pain 9)  Fluconazole 200 Mg Tabs (Fluconazole) .... One by mouth once daily for 10 days  Patient Instructions: 1)  Please schedule a follow-up appointment in 2 months. 2)  It is important that you exercise regularly at least 20 minutes 5 times a week. If you develop chest pain, have severe difficulty breathing, or feel very tired , stop exercising immediately and seek medical attention. 3)  You need to lose weight. Consider a lower calorie diet and regular exercise.  4)  Schedule a colonoscopy/sigmoidoscopy to help detect colon cancer. Prescriptions: FLUCONAZOLE  200 MG TABS (FLUCONAZOLE) One by mouth once daily for 10 days  #10 x 1   Entered and Authorized by:   Keith Grandchild MD   Signed by:   Keith Grandchild MD on 11/10/2009   Method used:   Electronically to        [facility]  [address] # [id number]* (retail)       [address]       [facility]  [place], [place]  [address]       Ph: [phone] or [phone]       Fax: [phone]   RxID:   [id number]

## 2010-03-11 NOTE — Assessment & Plan Note (Signed)
Summary: not feeling well/#/cd   Vital Signs:  Patient profile:   70 year old male Height:      69 inches Weight:      199 pounds O2 Sat:      97 % on Room air Temp:     101.0 degrees F oral Pulse rate:   84 / minute Pulse rhythm:   regular Resp:     16 per minute BP sitting:   122 / 80  (left arm) Cuff size:   large  Vitals Entered By: Rock Nephew CMA (February 23, 2009 10:09 AM)  O2 Flow:  Room air CC: congestion, fever, URI symptoms Is Patient Diabetic? No   Primary Care Provider:  [name] MD  CC:  congestion, fever, and URI symptoms.  History of Present Illness:  URI Symptoms      This is a 70 year old man who presents with URI symptoms.  The symptoms began 3 days ago.  The severity is described as moderate.  The patient reports nasal congestion, clear nasal discharge, and dry cough, but denies purulent nasal discharge, sore throat, productive cough, earache, and sick contacts.  Associated symptoms include fever of 100.5-103 degrees, use of an antipyretic, and response to antipyretic.  The patient denies stiff neck, dyspnea, wheezing, rash, vomiting, and diarrhea.  The patient also reports headache and muscle aches.  The patient denies severe fatigue.  The patient denies the following risk factors for Strep sinusitis: unilateral facial pain, unilateral nasal discharge, double sickening, tooth pain, Strep exposure, and tender adenopathy.    Also, he has persistent right elbow pain that radiates into his left forearm.  Preventive Screening-Counseling & Management  Alcohol-Tobacco     Alcohol drinks/day: 0     Smoking Status: never     Passive Smoke Exposure: yes  Hep-HIV-STD-Contraception     HIV Risk: no      Drug Use:  no.    Medications Prior to Update: 1)  Synthroid 125 Mcg Tabs (Levothyroxine Sodium) .... Take 1 Tablet By Mouth Once A Day 2)  Multivitamin 3)  Veramyst 27.5 Mcg/spray Susp (Fluticasone Furoate) .... 2 Puffs Each Nostril Once Daily 4)   Omega-3 Cf 1000 Mg Caps (Omega-3 Fatty Acids) .... Qg 5)  Aspirin Low Dose 81 Mg Tbec (Aspirin) .... Once Daily  Current Medications (verified): 1)  Synthroid 125 Mcg Tabs (Levothyroxine Sodium) .... Take 1 Tablet By Mouth Once A Day 2)  Multivitamin 3)  Veramyst 27.5 Mcg/spray Susp (Fluticasone Furoate) .... 2 Puffs Each Nostril Once Daily 4)  Omega-3 Cf 1000 Mg Caps (Omega-3 Fatty Acids) .... Qg 5)  Aspirin Low Dose 81 Mg Tbec (Aspirin) .... Once Daily 6)  Zithromax Tri-Pak 500 Mg Tab (Azithromycin) .... Take As Directed Once Daily For 3 Days 7)  Guiatuss Ac 100-10 Mg/25ml Syrp (Guaifenesin-Codeine) .... 5-10 Ml By Mouth Qid As Needed For Cough  Allergies (verified): 1)  ! Crestor (Rosuvastatin Calcium)  Past History:  Past Medical History: Reviewed history from 10/15/2008 and no changes required. Allergic rhinitis Hyperlipidemia DDD in C spine Hypothyroidism  Past Surgical History: Reviewed history from 03/03/2008 and no changes required. Tonsillectomy  Family History: Reviewed history from 03/03/2008 and no changes required. Family History Breast cancer 1st degree relative <50 Family History of CAD Male 1st degree relative <50 Diabetic Father had a Fatal MI         at 28 yoa Family History Diabetes 1st degree relative  Social History: Reviewed history from 03/03/2008 and no  changes required. Occupation: Retired Technical sales engineer, now drives a school bus full time. Married Never Smoked Alcohol use-no Drug use-no Regular exercise-yes  Review of Systems       The patient complains of fever.  The patient denies anorexia, weight loss, decreased hearing, hoarseness, chest pain, syncope, dyspnea on exertion, peripheral edema, headaches, hemoptysis, abdominal pain, hematuria, suspicious skin lesions, enlarged lymph nodes, and angioedema.   MS:  Complains of joint pain; denies joint redness, joint swelling, loss of strength, muscle aches, cramps, muscle weakness, and  stiffness.  Physical Exam  General:  alert, well-developed, well-nourished, well-hydrated, and overweight-appearing.   Ears:  R ear normal and L TM erythema.   Nose:  no airflow obstruction, no intranasal foreign body, no nasal polyps, no nasal mucosal lesions, no mucosal friability, no active bleeding or clots, no sinus percussion tenderness, nasal dischargemucosal pallor, and mucosal edema.   Mouth:  good dentition, pharynx pink and moist, no erythema, no exudates, no posterior lymphoid hypertrophy, no pharyngeal crowing, no lesions, no aphthous ulcers, no leukoplakia, and no petechiae.   Neck:  supple, full ROM, no masses, no thyromegaly, no thyroid nodules or tenderness, no JVD, no carotid bruits, no cervical lymphadenopathy, and no neck tenderness.   Lungs:  normal respiratory effort, no intercostal retractions, no accessory muscle use, normal breath sounds, no dullness, no fremitus, no crackles, and no wheezes.   Heart:  normal rate, regular rhythm, no murmur, no gallop, no rub, and no JVD.   Abdomen:  soft, non-tender, normal bowel sounds, no distention, no masses, no guarding, no rigidity, no hepatomegaly, and no splenomegaly.   Msk:  normal ROM, no joint tenderness, and no joint swelling.   Pulses:  R and L carotid,radial,femoral,dorsalis pedis and posterior tibial pulses are full and equal bilaterally Extremities:  No clubbing, cyanosis, edema, or deformity noted with normal full range of motion of all joints.   Neurologic:  alert & oriented X3, cranial nerves II-XII intact, strength normal in all extremities, and gait normal.   Skin:  Intact without suspicious lesions or rashes Cervical Nodes:  No lymphadenopathy noted Axillary Nodes:  no R axillary adenopathy and no L axillary adenopathy.   Inguinal Nodes:  no R inguinal adenopathy and no L inguinal adenopathy.   Psych:  Cognition and judgment appear intact. Alert and cooperative with normal attention span and concentration. No  apparent delusions, illusions, hallucinations   Impression & Recommendations:  Problem # 1:  FEVER (ICD-780.60) Assessment New  Orders: T-2 View CXR (71020TC) Flu A+B (91478)  Discussed fever control and symptomatic treatment.   Problem # 2:  COUGH (ICD-786.2) Assessment: New  Orders: T-2 View CXR (71020TC) Flu A+B (29562)  Problem # 3:  LATERAL EPICONDYLITIS, RIGHT (ICD-726.32) Assessment: Unchanged  Orders: Orthopedic Referral (Ortho)  Problem # 4:  BRONCHITIS-ACUTE (ICD-466.0) Assessment: New  His updated medication list for this problem includes:    Zithromax Tri-pak 500 Mg Tab (Azithromycin) .Marland Kitchen... Take as directed once daily for 3 days    Guiatuss Ac 100-10 Mg/24ml Syrp (Guaifenesin-codeine) .Marland Kitchen... 5-10 ml by mouth qid as needed for cough  Take antibiotics and other medications as directed. Encouraged to push clear liquids, get enough rest, and take acetaminophen as needed. To be seen in 5-7 days if no improvement, sooner if worse.  Complete Medication List: 1)  Synthroid 125 Mcg Tabs (Levothyroxine sodium) .... Take 1 tablet by mouth once a day 2)  Multivitamin  3)  Veramyst 27.5 Mcg/spray Susp (Fluticasone furoate) .... 2 puffs each nostril  once daily 4)  Omega-3 Cf 1000 Mg Caps (Omega-3 fatty acids) .... Qg 5)  Aspirin Low Dose 81 Mg Tbec (Aspirin) .... Once daily 6)  Zithromax Tri-pak 500 Mg Tab (Azithromycin) .... Take as directed once daily for 3 days 7)  Guiatuss Ac 100-10 Mg/53ml Syrp (Guaifenesin-codeine) .... 5-10 ml by mouth qid as needed for cough  Patient Instructions: 1)  Please schedule a follow-up appointment in 2 weeks. 2)  Take your antibiotic as prescribed until ALL of it is gone, but stop if you develop a rash or swelling and contact our office as soon as possible. 3)  Acute bronchitis symptoms for less than 10 days are not helped by antibiotics. take over the counter cough medications. call if no improvment in  5-7 days, sooner if increasing  cough, fever, or new symptoms( shortness of breath, chest pain). Prescriptions: GUIATUSS AC 100-10 MG/5ML SYRP (GUAIFENESIN-CODEINE) 5-10 ml by mouth QID as needed for cough  #6 ounces x 0   Entered and Authorized by:   [name] MD   Signed by:   [name] MD on 02/23/2009   Method used:   Print then Give to Patient   RxID:   [id number] ZITHROMAX TRI-PAK 500 MG TAB (AZITHROMYCIN) Take as directed once daily for 3 days  #3 x 0   Entered and Authorized by:   [name] MD   Signed by:   [name] MD on 02/23/2009   Method used:   Electronically to        [facility]  [address] # [id number]* (retail)       [address]       [place], [place]  [address]       Ph: [phone] or [phone]       Fax: [phone]   RxID:   [id number]   Laboratory Results    Other Tests  Influenza A: negative Influenza B: negative

## 2010-03-11 NOTE — Miscellaneous (Signed)
Summary: [facility] Previsit/prep  Clinical Lists Changes  Medications: Added new medication of MOVIPREP 100 GM  SOLR (PEG-KCL-NACL-NASULF-NA ASC-C) As per prep instructions. - Signed Rx of MOVIPREP 100 GM  SOLR (PEG-KCL-NACL-NASULF-NA ASC-C) As per prep instructions.;  #1 x 0;  Signed;  Entered by: Wyona Almas RN;  Authorized by: Meryl Dare MD [facility];  Method used: Electronically to [facility] # [id number]*, [address] [place], [place], [place]  [address], Ph: [phone] or [phone], Fax: [phone] Allergies: Added new allergy or adverse reaction of LIPITOR Observations: Added new observation of ALLERGY REV: Done (02/09/2010 15:59)    Prescriptions: MOVIPREP 100 GM  SOLR (PEG-KCL-NACL-NASULF-NA ASC-C) As per prep instructions.  #1 x 0   Entered by:   Wyona Almas RN   Authorized by:   Meryl Dare MD [facility]   Signed by:   Wyona Almas RN on 02/09/2010   Method used:   Electronically to        [facility] [id number] (retail)       [address]       [place], [place]  [address]       Ph: [phone] or [phone]       Fax: [phone]   RxID:   [id number]

## 2010-03-11 NOTE — Letter (Signed)
Summary: Lipid Letter  [facility]  [address] [place], [place] [address]   Phone: [phone]  Fax: [phone]    11/10/2009  [facility] [address] [place], [place]  [address]  Dear Windy Fast:  We have carefully reviewed your last lipid profile from 11/10/2009 and the results are noted below with a summary of recommendations for lipid management.    Cholesterol:       174     Goal: <200   HDL "good" Cholesterol:   18.84     Goal: >40   LDL "bad" Cholesterol:   115     Goal: <130   Triglycerides:       103.0     Goal: <150        TLC Diet (Therapeutic Lifestyle Change): Saturated Fats & Transfatty acids should be kept < 7% of total calories ***Reduce Saturated Fats Polyunstaurated Fat can be up to 10% of total calories Monounsaturated Fat Fat can be up to 20% of total calories Total Fat should be no greater than 25-35% of total calories Carbohydrates should be 50-60% of total calories Protein should be approximately 15% of total calories Fiber should be at least 20-30 grams a day ***Increased fiber may help lower LDL Total Cholesterol should be < 200mg /day Consider adding plant stanol/sterols to diet (example: Benacol spread) ***A higher intake of unsaturated fat may reduce Triglycerides and Increase HDL    Adjunctive Measures (may lower LIPIDS and reduce risk of Heart Attack) include: Aerobic Exercise (20-30 minutes 3-4 times a week) Limit Alcohol Consumption Weight Reduction Aspirin 75-81 mg a day by mouth (if not allergic or contraindicated) Dietary Fiber 20-30 grams a day by mouth     Current Medications: 1)    Synthroid 125 Mcg Tabs (Levothyroxine sodium) .... Take 1 tablet by mouth once a day 2)    Multivitamin  3)    Veramyst 27.5 Mcg/spray Susp (Fluticasone furoate) .... 2 puffs each nostril once daily 4)    Omega-3 Cf 1000 Mg Caps (Omega-3 fatty acids) .... Qg 5)    Aspirin Low Dose 81 Mg Tbec (Aspirin) .... Once daily 6)    Guiatuss Ac  100-10 Mg/110ml Syrp (Guaifenesin-codeine) .... 5-10 ml by mouth qid as needed for cough 7)    Red Yeast Rice 600 Mg Tabs (Red yeast rice extract) 8)    Vimovo 500-20 Mg Tbec (Naproxen-esomeprazole) .... One by mouth two times a day as needed for hip pain 9)    Fluconazole 200 Mg Tabs (Fluconazole) .... One by mouth once daily for 10 days  If you have any questions, please call. We appreciate being able to work with you.   Sincerely,    [facility] [name] MD

## 2010-03-11 NOTE — Assessment & Plan Note (Signed)
Summary: 1 WEEK FOLLOW UP-LB   Vital Signs:  Patient profile:   70 year old male Height:      69 inches Weight:      197 pounds BMI:     29.20 O2 Sat:      97 % on Room air Temp:     97.9 degrees F oral Pulse rate:   67 / minute Pulse rhythm:   regular Resp:     16 per minute BP sitting:   120 / 82  (left arm) Cuff size:   large  Vitals Entered By: Rock Nephew CMA (March 04, 2009 11:31 AM)  Nutrition Counseling: Patient's BMI is greater than 25 and therefore counseled on weight management options.  O2 Flow:  Room air  Primary Care Provider:  [name] MD   History of Present Illness: He returns for f/up and feels much better.  Preventive Screening-Counseling & Management  Alcohol-Tobacco     Alcohol drinks/day: 0     Smoking Status: never     Passive Smoke Exposure: yes  Hep-HIV-STD-Contraception     HIV Risk: no  Current Medications (verified): 1)  Synthroid 125 Mcg Tabs (Levothyroxine Sodium) .... Take 1 Tablet By Mouth Once A Day 2)  Multivitamin 3)  Veramyst 27.5 Mcg/spray Susp (Fluticasone Furoate) .... 2 Puffs Each Nostril Once Daily 4)  Omega-3 Cf 1000 Mg Caps (Omega-3 Fatty Acids) .... Qg 5)  Aspirin Low Dose 81 Mg Tbec (Aspirin) .... Once Daily 6)  Guiatuss Ac 100-10 Mg/15ml Syrp (Guaifenesin-Codeine) .... 5-10 Ml By Mouth Qid As Needed For Cough  Allergies (verified): 1)  ! Crestor (Rosuvastatin Calcium)  Past History:  Past Medical History: Reviewed history from 10/15/2008 and no changes required. Allergic rhinitis Hyperlipidemia DDD in C spine Hypothyroidism  Past Surgical History: Reviewed history from 03/03/2008 and no changes required. Tonsillectomy  Family History: Reviewed history from 03/03/2008 and no changes required. Family History Breast cancer 1st degree relative <50 Family History of CAD Male 1st degree relative <50 Diabetic Father had a Fatal MI         at 25 yoa Family History Diabetes 1st degree relative  Social  History: Reviewed history from 03/03/2008 and no changes required. 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   General:  Denies chills, fatigue, fever, loss of appetite, malaise, sweats, and weakness.  Physical Exam  General:  alert, well-developed, well-nourished, well-hydrated, and overweight-appearing.   Ears:  R ear normal and L ear normal.   Nose:  no airflow obstruction, no intranasal foreign body, no nasal polyps, no nasal mucosal lesions, no mucosal friability, no active bleeding or clots, no sinus percussion tenderness, nasal dischargemucosal pallor, and mucosal edema.   Mouth:  good dentition, pharynx pink and moist, no erythema, no exudates, no posterior lymphoid hypertrophy, no pharyngeal crowing, no lesions, no aphthous ulcers, no leukoplakia, and no petechiae.   Neck:  supple, full ROM, no masses, no thyromegaly, no thyroid nodules or tenderness, no JVD, no carotid bruits, no cervical lymphadenopathy, and no neck tenderness.   Lungs:  normal respiratory effort, no intercostal retractions, no accessory muscle use, normal breath sounds, no dullness, no fremitus, no crackles, and no wheezes.   Heart:  normal rate, regular rhythm, no murmur, no gallop, no rub,  and no JVD.   Abdomen:  soft, non-tender, normal bowel sounds, no distention, no masses, no guarding, no rigidity, no hepatomegaly, and no splenomegaly.   Msk:  normal ROM, no joint tenderness, and no joint swelling.   Pulses:  R and L carotid,radial,femoral,dorsalis pedis and posterior tibial pulses are full and equal bilaterally Extremities:  No clubbing, cyanosis, edema, or deformity noted with normal full range of motion of all joints.     Neurologic:  alert & oriented X3, cranial nerves II-XII intact, strength normal in all extremities, and gait normal.   Skin:  Intact without suspicious lesions or rashes Cervical Nodes:  No lymphadenopathy noted Axillary Nodes:  no R axillary adenopathy and no L axillary adenopathy.   Inguinal Nodes:  no R inguinal adenopathy and no L inguinal adenopathy.   Psych:  Cognition and judgment appear intact. Alert and cooperative with normal attention span and concentration. No apparent delusions, illusions, hallucinations   Impression & Recommendations:  Problem # 1:  UNSPECIFIED BACTERIAL PNEUMONIA (ICD-482.9) Assessment Unchanged  Orders: T-2 View CXR (71020TC)  Complete Medication List: 1)  Synthroid 125 Mcg Tabs (Levothyroxine sodium) .... Take 1 tablet by mouth once a day 2)  Multivitamin  3)  Veramyst 27.5 Mcg/spray Susp (Fluticasone furoate) .... 2 puffs each nostril once daily 4)  Omega-3 Cf 1000 Mg Caps (Omega-3 fatty acids) .... Qg 5)  Aspirin Low Dose 81 Mg Tbec (Aspirin) .... Once daily 6)  Guiatuss Ac 100-10 Mg/55ml Syrp (Guaifenesin-codeine) .... 5-10 ml by mouth qid as needed for cough  Patient Instructions: 1)  Please schedule a follow-up appointment in 1 month.

## 2010-03-11 NOTE — Letter (Signed)
Summary: Patient Notice- Polyp Results  [facility]  [address] [place], [place] [address]   Phone: [phone]  Fax: [phone]        March 02, 2010 MRN: [id number]    [facility] [address] [place], [place]  [address]    Dear Keith [name],  I am pleased to inform you that the colon polyp(s) removed during your recent colonoscopy was (were) found to be benign (no cancer detected) upon pathologic examination.  I recommend you have a repeat colonoscopy examination in 5 years to look for recurrent polyps, as having colon polyps increases your risk for having recurrent polyps or even colon cancer in the future.  Should you develop new or worsening symptoms of abdominal pain, bowel habit changes or bleeding from the rectum or bowels, please schedule an evaluation with either your primary care physician or with me.  Continue treatment plan as outlined the day of your exam.  Please call us if you are having persistent problems or have questions about your condition that have not been fully answered at this time.  Sincerely,  Meryl Dare MD [facility]  This letter has been electronically signed by your physician.  Appended Document: Patient Notice- Polyp Results LETTER MAILED

## 2010-03-11 NOTE — Letter (Signed)
Summary: Lipid Letter  [facility]  [address] [place], [place] [address]   Phone: [phone]  Fax: [phone]    04/20/2009  [facility] [address] [place], [place]  [address]  Dear Windy Fast:  We have carefully reviewed your last lipid profile from 04/20/2009 and the results are noted below with a summary of recommendations for lipid management.    Cholesterol:       128     Goal: <200   HDL "good" Cholesterol:   62.95     Goal: >40   LDL "bad" Cholesterol:   70     Goal: <130   Triglycerides:       117.0     Goal: <150        TLC Diet (Therapeutic Lifestyle Change): Saturated Fats & Transfatty acids should be kept < 7% of total calories ***Reduce Saturated Fats Polyunstaurated Fat can be up to 10% of total calories Monounsaturated Fat Fat can be up to 20% of total calories Total Fat should be no greater than 25-35% of total calories Carbohydrates should be 50-60% of total calories Protein should be approximately 15% of total calories Fiber should be at least 20-30 grams a day ***Increased fiber may help lower LDL Total Cholesterol should be < 200mg /day Consider adding plant stanol/sterols to diet (example: Benacol spread) ***A higher intake of unsaturated fat may reduce Triglycerides and Increase HDL    Adjunctive Measures (may lower LIPIDS and reduce risk of Heart Attack) include: Aerobic Exercise (20-30 minutes 3-4 times a week) Limit Alcohol Consumption Weight Reduction Aspirin 75-81 mg a day by mouth (if not allergic or contraindicated) Dietary Fiber 20-30 grams a day by mouth     Current Medications: 1)    Synthroid 125 Mcg Tabs (Levothyroxine sodium) .... Take 1 tablet by mouth once a day 2)    Multivitamin  3)    Veramyst 27.5 Mcg/spray Susp (Fluticasone furoate) .... 2 puffs each nostril once daily 4)    Omega-3 Cf 1000 Mg Caps (Omega-3 fatty acids) .... Qg 5)    Aspirin Low Dose 81 Mg Tbec (Aspirin) .... Once daily 6)    Guiatuss Ac  100-10 Mg/53ml Syrp (Guaifenesin-codeine) .... 5-10 ml by mouth qid as needed for cough 7)    Cipro 500 Mg Tab (Ciprofloxacin hcl) .... Take 1 tablet by mouth morning and night x 10 days 8)    Red Yeast Rice 600 Mg Tabs (Red yeast rice extract)  If you have any questions, please call. We appreciate being able to work with you.   Sincerely,     [facility] [name] MD

## 2010-03-11 NOTE — Letter (Signed)
Summary: Pre Visit Letter Revised  [facility]  [address] [place], [place] [address]   Phone: [phone]  Fax: [phone]        02/09/2010 MRN: [id number] [facility] [place] [address] [place], [place]  [address]             Procedure Date:  02-23-10   Welcome to the Gastroenterology Division at [facility].    You are scheduled to see a nurse for your pre-procedure visit on 02-09-10 at 4:00p.m. on the 3rd floor at [facility], [address]  We ask that you try to arrive at our office 15 minutes prior to your appointment time to allow for check-in.  Please take a minute to review the attached form.  If you answer "Yes" to one or more of the questions on the first page, we ask that you call the person listed at your earliest opportunity.  If you answer "No" to all of the questions, please complete the rest of the form and bring it to your appointment.    Your nurse visit will consist of discussing your medical and surgical history, your immediate family medical history, and your medications.   If you are unable to list all of your medications on the form, please bring the medication bottles to your appointment and we will list them.  We will need to be aware of both prescribed and over the counter drugs.  We will need to know exact dosage information as well.    Please be prepared to read and sign documents such as consent forms, a financial agreement, and acknowledgement forms.  If necessary, and with your consent, a friend or relative is welcome to sit-in on the nurse visit with you.  Please bring your insurance card so that we may make a copy of it.  If your insurance requires a referral to see a specialist, please bring your referral form from your primary care physician.  No co-pay is required for this nurse visit.     If you cannot keep your appointment, please call [phone] to cancel or reschedule prior to your appointment date.  This allows [place]  the opportunity to schedule an appointment for another patient in need of care.    Thank you for choosing [facility] for your medical needs.  We appreciate the opportunity to care for you.  Please visit [place] at our website  to learn more about our practice.  Sincerely, The Gastroenterology Division

## 2010-03-11 NOTE — Letter (Signed)
Summary: Results Follow-up Letter  [facility]  [address] [place], [place] [address]   Phone: [phone]  Fax: [phone]    02/23/2009  [address] [place], [place]  [address]  Dear Mr. ZECK,   The following are the results of your recent test(s):  Test     Result     Chest Xray     small area of pneumonia on the left side   _________________________________________________________  Please call for an appointment in 1-2 weeks _________________________________________________________ _________________________________________________________ _________________________________________________________  Sincerely,  Sanda Linger MD [facility]

## 2010-04-15 ENCOUNTER — Telehealth: Payer: Self-pay | Admitting: Internal Medicine

## 2010-04-15 ENCOUNTER — Other Ambulatory Visit: Payer: Self-pay | Admitting: Internal Medicine

## 2010-04-15 ENCOUNTER — Encounter: Payer: Self-pay | Admitting: Internal Medicine

## 2010-04-15 ENCOUNTER — Other Ambulatory Visit: Payer: BC Managed Care – PPO

## 2010-04-15 ENCOUNTER — Ambulatory Visit (INDEPENDENT_AMBULATORY_CARE_PROVIDER_SITE_OTHER): Payer: BC Managed Care – PPO | Admitting: Internal Medicine

## 2010-04-15 DIAGNOSIS — J019 Acute sinusitis, unspecified: Secondary | ICD-10-CM

## 2010-04-15 DIAGNOSIS — J309 Allergic rhinitis, unspecified: Secondary | ICD-10-CM

## 2010-04-15 DIAGNOSIS — E039 Hypothyroidism, unspecified: Secondary | ICD-10-CM

## 2010-04-15 LAB — TSH: TSH: 5.29 u[IU]/mL (ref 0.35–5.50)

## 2010-04-20 NOTE — Progress Notes (Signed)
Summary: MED ALT  Phone Note From Pharmacy   Caller: [facility]  [address] # [id number]* Summary of Call: Ins will not cover veramyst. Flonase is covered alt. Please advise.  Initial call taken by: Lamar Sprinkles, CMA,  April 15, 2010 11:56 AM    New/Updated Medications: FLUTICASONE PROPIONATE 50 MCG/ACT SUSP (FLUTICASONE PROPIONATE) 2 puffs each nostril once daily Prescriptions: FLUTICASONE PROPIONATE 50 MCG/ACT SUSP (FLUTICASONE PROPIONATE) 2 puffs each nostril once daily  #1 inh x 11   Entered and Authorized by:   [name] MD   Signed by:   [name] MD on 04/15/2010   Method used:   Electronically to        [facility]  [address] # [id number]* (retail)       [address]       [place], [place]  [address]       Ph: [phone] or [phone]       Fax: [phone]   RxID:   [id number]

## 2010-04-20 NOTE — Assessment & Plan Note (Signed)
Summary: SINUS PROBLEM--D/T---STC   Vital Signs:  Patient profile:   70 year old male Height:      69 inches Weight:      197 pounds BMI:     29.20 O2 Sat:      97 % on Room air Temp:     98.6 degrees F oral Pulse rate:   62 / minute Pulse rhythm:   regular Resp:     16 per minute BP sitting:   112 / 78  (left arm) Cuff size:   large  Vitals Entered By: Rock Nephew CMA (April 15, 2010 11:20 AM)  Nutrition Counseling: Patient's BMI is greater than 25 and therefore counseled on weight management options.  O2 Flow:  Room air CC: pt c/o sinusu congestion and drainage, URI symptoms Is Patient Diabetic? No Pain Assessment Patient in pain? no        Primary Care Provider:  [name] MD  CC:  pt c/o sinusu congestion and drainage and URI symptoms.  History of Present Illness:  URI Symptoms      This is a 70 year old man who presents with URI symptoms.  The symptoms began 3 weeks ago.  The severity is described as moderate.  The patient reports nasal congestion, clear nasal discharge, purulent nasal discharge, and sore throat, but denies dry cough, productive cough, earache, and sick contacts.  The patient denies fever, stiff neck, dyspnea, wheezing, rash, vomiting, diarrhea, use of an antipyretic, and response to antipyretic.  The patient also reports itchy throat, sneezing, seasonal symptoms, and response to antihistamine.  The patient denies headache, muscle aches, and severe fatigue.  Risk factors for Strep sinusitis include unilateral facial pain, unilateral nasal discharge, poor response to decongestant, and double sickening.  The patient denies the following risk factors for Strep sinusitis: tooth pain, Strep exposure, tender adenopathy, and absence of cough.    Preventive Screening-Counseling & Management  Alcohol-Tobacco     Alcohol drinks/day: 0     Alcohol Counseling: not indicated; patient does not drink     Smoking Status: never     Passive Smoke Exposure:  yes     Tobacco Counseling: not indicated; no tobacco use  Hep-HIV-STD-Contraception     Hepatitis Risk: no risk noted     HIV Risk: no     STD Risk: no risk noted     TSE monthly: yes     Testicular SE Education/Counseling to perform regular STE     Sun Exposure-Excessive: yes     Sun Exposure Counseling: not indicated; sun exposure is acceptable      Sexual History:  currently monogamous.        Drug Use:  never.        Blood Transfusions:  no.    Clinical Review Panels:  Prevention   Last Colonoscopy:  DONE (02/23/2010)   Last PSA:  3.51 (11/10/2009)  Immunizations   Last Flu Vaccine:  Fluvax 3+ (10/06/2009)  Lipid Management   Cholesterol:  174 (11/10/2009)   LDL (bad choesterol):  115 (11/10/2009)   HDL (good cholesterol):  38.20 (11/10/2009)  Diabetes Management   Creatinine:  1.0 (11/10/2009)   Last Flu Vaccine:  Fluvax 3+ (10/06/2009)  CBC   WBC:  8.6 (11/10/2009)   RBC:  4.84 (11/10/2009)   Hgb:  15.2 (11/10/2009)   Hct:  44.8 (11/10/2009)   Platelets:  191.0 (11/10/2009)   MCV  92.7 (11/10/2009)   MCHC  34.0 (11/10/2009)   RDW  14.1 (11/10/2009)   PMN:  66.1 (11/10/2009)   Lymphs:  21.9 (11/10/2009)   Monos:  7.1 (11/10/2009)   Eosinophils:  4.1 (11/10/2009)   Basophil:  0.8 (11/10/2009)  Complete Metabolic Panel   Glucose:  97 (11/10/2009)   Sodium:  140 (11/10/2009)   Potassium:  4.3 (11/10/2009)   Chloride:  107 (11/10/2009)   CO2:  25 (11/10/2009)   BUN:  15 (11/10/2009)   Creatinine:  1.0 (11/10/2009)   Albumin:  3.8 (11/10/2009)   Total Protein:  6.5 (11/10/2009)   Calcium:  8.9 (11/10/2009)   Total Bili:  1.0 (11/10/2009)   Alk Phos:  42 (11/10/2009)   SGPT (ALT):  20 (11/10/2009)   SGOT (AST):  25 (11/10/2009)   Medications Prior to Update: 1)  Synthroid 125 Mcg Tabs (Levothyroxine Sodium) .... Take 1 Tablet By Mouth Once A Day 2)  Multivitamin 3)  Veramyst 27.5 Mcg/spray Susp (Fluticasone Furoate) .... 2 Puffs Each Nostril Once  Daily 4)  Omega-3 Cf 1000 Mg Caps (Omega-3 Fatty Acids) .... Qg 5)  Aspirin Low Dose 81 Mg Tbec (Aspirin) .... Once Daily 6)  Guiatuss Ac 100-10 Mg/67ml Syrp (Guaifenesin-Codeine) .... 5-10 Ml By Mouth Qid As Needed For Cough 7)  Red Yeast Rice 600 Mg Tabs (Red Yeast Rice Extract) 8)  Vimovo 500-20 Mg Tbec (Naproxen-Esomeprazole) .... One By Mouth Two Times A Day As Needed For Hip Pain 9)  Fluconazole 200 Mg Tabs (Fluconazole) .... One By Mouth Once Daily For 10 Days  Current Medications (verified): 1)  Synthroid 125 Mcg Tabs (Levothyroxine Sodium) .... Take 1 Tablet By Mouth Once A Day 2)  Multivitamin 3)  Veramyst 27.5 Mcg/spray Susp (Fluticasone Furoate) .... 2 Puffs Each Nostril Once Daily 4)  Omega-3 Cf 1000 Mg Caps (Omega-3 Fatty Acids) .... Qg 5)  Aspirin Low Dose 81 Mg Tbec (Aspirin) .... Once Daily 6)  Guiatuss Ac 100-10 Mg/62ml Syrp (Guaifenesin-Codeine) .... 5-10 Ml By Mouth Qid As Needed For Cough 7)  Red Yeast Rice 600 Mg Tabs (Red Yeast Rice Extract) 8)  Vimovo 500-20 Mg Tbec (Naproxen-Esomeprazole) .... One By Mouth Two Times A Day As Needed For Hip Pain 9)  Fluconazole 200 Mg Tabs (Fluconazole) .... One By Mouth Once Daily For 10 Days 10)  Ceftin 500 Mg Tabs (Cefuroxime Axetil) .... One By Mouth Two Times A Day For 10 Days  Allergies (verified): 1)  ! Crestor (Rosuvastatin Calcium) 2)  ! Lipitor  Past History:  Past Medical History: Last updated: 10/15/2008 Allergic rhinitis Hyperlipidemia DDD in C spine Hypothyroidism  Past Surgical History: Last updated: 03/03/2008 Tonsillectomy  Family History: Last updated: 03/03/2008 Family History Breast cancer 1st degree relative <50 Family History of CAD Male 1st degree relative <50 Diabetic Father had a Fatal MI         at 57 yoa Family History Diabetes 1st degree relative  Social History: Last updated: 03/03/2008 Occupation: Retired Technical sales engineer, now drives a school bus full time. Married Never Smoked Alcohol  use-no Drug use-no Regular exercise-yes  Risk Factors: Alcohol Use: 0 (04/15/2010) Exercise: yes (03/03/2008)  Risk Factors: Smoking Status: never (04/15/2010) Passive Smoke Exposure: yes (04/15/2010)  Family History: Reviewed history from 03/03/2008 and no changes required. Family History Breast cancer 1st degree relative <50 Family History of CAD Male 1st degree relative <50 Diabetic Father had a Fatal MI         at 93 yoa Family History Diabetes 1st degree relative  Social History: Reviewed history from 03/03/2008 and no changes required.  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   Endo:  Denies cold intolerance, excessive hunger, excessive thirst, excessive urination, heat intolerance, polyuria, and weight change.  Physical Exam  General:  alert, well-developed, well-nourished, well-hydrated, appropriate dress, normal appearance, healthy-appearing, cooperative to examination, and good hygiene.   Eyes:  No corneal or conjunctival inflammation noted. EOMI. Perrla. Funduscopic exam benign, without hemorrhages, exudates or papilledema. Vision grossly normal. Ears:  R ear normal and L ear normal.   Nose:  no external deformity, no nasal discharge, no mucosal pallor, no mucosal edema, no airflow obstruction, no intranasal foreign body, no nasal polyps, no nasal mucosal lesions, no mucosal friability, no active bleeding or clots, no sinus percussion tenderness, no septum abnormalities, mucosal erythema, and mucosal edema.   Mouth:  Oral mucosa and oropharynx  without lesions or exudates.  Teeth in good repair. Neck:  supple, full ROM, no masses, no thyromegaly, no JVD, normal carotid upstroke, no carotid bruits, no cervical lymphadenopathy, and no neck tenderness.   Lungs:  normal respiratory effort, no intercostal retractions, no accessory muscle use, normal breath sounds, no dullness, no fremitus, no crackles, and no wheezes.   Heart:  Normal rate and regular rhythm. S1 and S2 normal without gallop, murmur, click, rub or other extra sounds. Abdomen:  soft, non-tender, normal bowel sounds, no distention, no masses, no guarding, no rigidity, no rebound tenderness, no abdominal hernia, no inguinal hernia, no hepatomegaly, and no splenomegaly.   Msk:  No deformity or scoliosis noted of thoracic or lumbar spine.   Pulses:  R and L carotid,radial,femoral,dorsalis pedis and posterior tibial pulses are full and equal bilaterally Extremities:  No clubbing, cyanosis, edema, or deformity noted with normal full range of motion of all joints.   Neurologic:  No cranial nerve deficits noted. Station and gait are normal. Plantar reflexes are down-going bilaterally. DTRs are symmetrical throughout. Sensory, motor and coordinative functions appear intact. Skin:  turgor normal, no rashes, no suspicious lesions, no ecchymoses, no petechiae, no purpura, no ulcerations, no edema, excessive tan, and solar damage.   Cervical Nodes:  no anterior cervical adenopathy and no posterior cervical adenopathy.   Axillary Nodes:  no R axillary adenopathy and no L axillary adenopathy.   Psych:  Cognition and judgment appear intact. Alert and cooperative with normal attention span and concentration. No apparent delusions, illusions, hallucinations   Impression & Recommendations:  Problem # 1:  HYPOTHYROIDISM (ICD-244.9) Assessment Unchanged  His updated medication list for this problem includes:    Synthroid 125 Mcg Tabs (Levothyroxine sodium) .Marland Kitchen... Take 1 tablet by mouth once a  day  Orders: TLB-TSH (Thyroid Stimulating Hormone) (84443-TSH)  Labs Reviewed: TSH: 0.69 (11/10/2009)    Chol: 174 (11/10/2009)   HDL: 38.20 (11/10/2009)   LDL: 115 (11/10/2009)   TG: 103.0 (11/10/2009)  Problem # 2:  SINUSITIS- ACUTE-NOS (ICD-461.9) Assessment: New  His updated medication list for this problem includes:    Guiatuss Ac 100-10 Mg/58ml Syrp (Guaifenesin-codeine) .Marland Kitchen... 5-10 ml by mouth qid as needed for cough    Ceftin 500 Mg Tabs (Cefuroxime axetil) ..... One by mouth two times a day for 10 days    Fluticasone Propionate 50 Mcg/act Susp (  Fluticasone propionate) .Marland Kitchen... 2 puffs each nostril once daily  Instructed on treatment. Call if symptoms persist or worsen.   Problem # 3:  ALLERGIC RHINITIS (ICD-477.9) Assessment: Deteriorated  His updated medication list for this problem includes:    Fluticasone Propionate 50 Mcg/act Susp (Fluticasone propionate) .Marland Kitchen... 2 puffs each nostril once daily  Discussed use of allergy medications and environmental measures.   Complete Medication List: 1)  Synthroid 125 Mcg Tabs (Levothyroxine sodium) .... Take 1 tablet by mouth once a day 2)  Multivitamin  3)  Omega-3 Cf 1000 Mg Caps (Omega-3 fatty acids) .... Qg 4)  Aspirin Low Dose 81 Mg Tbec (Aspirin) .... Once daily 5)  Guiatuss Ac 100-10 Mg/66ml Syrp (Guaifenesin-codeine) .... 5-10 ml by mouth qid as needed for cough 6)  Red Yeast Rice 600 Mg Tabs (Red yeast rice extract) 7)  Vimovo 500-20 Mg Tbec (Naproxen-esomeprazole) .... One by mouth two times a day as needed for hip pain 8)  Fluconazole 200 Mg Tabs (Fluconazole) .... One by mouth once daily for 10 days 9)  Ceftin 500 Mg Tabs (Cefuroxime axetil) .... One by mouth two times a day for 10 days 10)  Fluticasone Propionate 50 Mcg/act Susp (Fluticasone propionate) .... 2 puffs each nostril once daily  Patient Instructions: 1)  Please schedule a follow-up appointment in 2 weeks. 2)  Take your antibiotic as prescribed until ALL of it  is gone, but stop if you develop a rash or swelling and contact our office as soon as possible. 3)  Acute sinusitis symptoms for less than 10 days are not helped by antibiotics.Use warm moist compresses, and over the counter decongestants ( only as directed). Call if no improvement in 5-7 days, sooner if increasing pain, fever, or new symptoms. Prescriptions: CEFTIN 500 MG TABS (CEFUROXIME AXETIL) One by mouth two times a day for 10 days  #20 x 1   Entered and Authorized by:   [name] MD   Signed by:   [name] MD on 04/15/2010   Method used:   Electronically to        [facility]  [address] # [id number]* (retail)       [address]       [place], [place]  [address]       Ph: [phone] or [phone]       Fax: [phone]   RxID:   [id number] VERAMYST 27.5 MCG/SPRAY SUSP (FLUTICASONE FUROATE) 2 puffs each nostril once daily  #1 inh x 11   Entered and Authorized by:   [name] MD   Signed by:   [name] MD on 04/15/2010   Method used:   Electronically to        [facility]  [address] # [id number]* (retail)       [address]       [place], [place]  [address]       Ph: [phone] or [phone]       Fax: [phone]   RxID:   [id number]    Orders Added: 1)  TLB-TSH (Thyroid Stimulating Hormone) [84443-TSH] 2)  Est. Patient Level IV [57[address]]

## 2010-05-18 ENCOUNTER — Ambulatory Visit: Payer: BC Managed Care – PPO | Admitting: Internal Medicine

## 2010-06-25 NOTE — Assessment & Plan Note (Signed)
[facility]                                 ON-CALL NOTE   NAME:[name], Keith                         MRN:          [id number]  DATE:01/30/2009                            DOB:          May 22, 1940    PHONE NUMBER:  [phone].   Dr. Yetta Barre is regular doctor and I am Dr. Milinda Antis on call.   CHIEF COMPLAINT:  ? Sinus infection.   The patient said he has had a cold for a week.  He has congestion, runny  and stuffy nose.  No facial pain, no fever.  He wants to get something  called in.  I told him that he may just be continuing with the cold  symptoms at this point.  I advised if he has facial pain, fever or  severe cough or if he worsens, to go on to urgent care and get checked  out.  Otherwise, to try Mucinex plain over-the-counter to help loosen  congestion and nasal saline spray as well.  Invited him to call back if  he has any other questions or concerns and he did voiced understanding  that as the symptoms worsen, he would go to Urgent Care.     Marne A. Tower, MD     MAT/MedQ  DD: 01/30/2009  DT: 01/30/2009  Job #: [id number]

## 2010-08-22 ENCOUNTER — Other Ambulatory Visit: Payer: Self-pay | Admitting: Internal Medicine

## 2010-12-29 ENCOUNTER — Other Ambulatory Visit (INDEPENDENT_AMBULATORY_CARE_PROVIDER_SITE_OTHER): Payer: BC Managed Care – PPO

## 2010-12-29 ENCOUNTER — Encounter: Payer: Self-pay | Admitting: Internal Medicine

## 2010-12-29 ENCOUNTER — Ambulatory Visit (INDEPENDENT_AMBULATORY_CARE_PROVIDER_SITE_OTHER): Payer: BC Managed Care – PPO | Admitting: Internal Medicine

## 2010-12-29 VITALS — BP 132/84 | HR 65 | Temp 97.9°F | Resp 16 | Ht 69.0 in | Wt 200.8 lb

## 2010-12-29 DIAGNOSIS — E039 Hypothyroidism, unspecified: Secondary | ICD-10-CM

## 2010-12-29 DIAGNOSIS — E785 Hyperlipidemia, unspecified: Secondary | ICD-10-CM

## 2010-12-29 DIAGNOSIS — Z23 Encounter for immunization: Secondary | ICD-10-CM

## 2010-12-29 DIAGNOSIS — J309 Allergic rhinitis, unspecified: Secondary | ICD-10-CM

## 2010-12-29 LAB — CBC WITH DIFFERENTIAL/PLATELET
Basophils Absolute: 0 10*3/uL (ref 0.0–0.1)
Hemoglobin: 15.7 g/dL (ref 13.0–17.0)
Lymphocytes Relative: 20.2 % (ref 12.0–46.0)
Monocytes Relative: 6.6 % (ref 3.0–12.0)
Neutro Abs: 6.7 10*3/uL (ref 1.4–7.7)
Neutrophils Relative %: 68.9 % (ref 43.0–77.0)
Platelets: 201 10*3/uL (ref 150.0–400.0)
RDW: 13.7 % (ref 11.5–14.6)

## 2010-12-29 LAB — COMPREHENSIVE METABOLIC PANEL
ALT: 18 U/L (ref 0–53)
Albumin: 3.8 g/dL (ref 3.5–5.2)
CO2: 28 mEq/L (ref 19–32)
Calcium: 8.5 mg/dL (ref 8.4–10.5)
Chloride: 105 mEq/L (ref 96–112)
GFR: 73.56 mL/min (ref >60.00)
Potassium: 4.4 mEq/L (ref 3.5–5.1)
Sodium: 139 mEq/L (ref 135–145)
Total Protein: 6.9 g/dL (ref 6.0–8.3)

## 2010-12-29 LAB — LIPID PANEL
LDL Cholesterol: 127 mg/dL — ABNORMAL HIGH (ref 0–99)
Total CHOL/HDL Ratio: 5

## 2010-12-29 MED ORDER — METHYLPREDNISOLONE 4 MG PO KIT: PACK | ORAL | Status: AC

## 2010-12-29 NOTE — Progress Notes (Signed)
Subjective:    Patient ID: Keith [name], male    DOB: [birth year], 70 y.o.   MRN: [id number]  Thyroid Problem Presents for follow-up visit. Patient reports no anxiety, cold intolerance, constipation, depressed mood, diaphoresis, diarrhea, dry skin, fatigue, hair loss, heat intolerance, hoarse voice, leg swelling, nail problem, palpitations, tremors, visual change, weight gain or weight loss. The symptoms have been stable.      Review of Systems  Constitutional: Negative for fever, chills, weight loss, weight gain, diaphoresis, activity change, appetite change, fatigue and unexpected weight change.  HENT: Positive for congestion, rhinorrhea, sneezing and postnasal drip. Negative for hearing loss, ear pain, nosebleeds, sore throat, hoarse voice, facial swelling, trouble swallowing, neck pain, neck stiffness, voice change, sinus pressure, tinnitus and ear discharge.   Eyes: Negative for photophobia, pain, discharge, redness, itching and visual disturbance.  Respiratory: Negative for cough, choking, shortness of breath, wheezing and stridor.   Cardiovascular: Negative for chest pain, palpitations and leg swelling.  Gastrointestinal: Negative for nausea, vomiting, abdominal pain, diarrhea, constipation, blood in stool, abdominal distention and anal bleeding.  Genitourinary: Negative.  Negative for dysuria, urgency, frequency, hematuria, flank pain, decreased urine volume, enuresis and difficulty urinating.  Musculoskeletal: Negative for myalgias, back pain, joint swelling, arthralgias and gait problem.  Skin: Negative for color change, pallor, rash and wound.  Neurological: Negative for dizziness, tremors, seizures, syncope, facial asymmetry, speech difficulty, weakness, light-headedness, numbness and headaches.  Hematological: Negative for cold intolerance, heat intolerance and adenopathy. Does not bruise/bleed easily.  Psychiatric/Behavioral: Negative.        Objective:   Physical Exam    Vitals reviewed. Constitutional: He is oriented to person, place, and time. He appears well-developed and well-nourished. No distress.  HENT:  Head: Normocephalic and atraumatic. No trismus in the jaw.  Right Ear: Hearing, tympanic membrane, external ear and ear canal normal.  Left Ear: Hearing, tympanic membrane, external ear and ear canal normal.  Nose: Mucosal edema and rhinorrhea present. No nose lacerations, sinus tenderness, nasal deformity, septal deviation or nasal septal hematoma. No epistaxis.  No foreign bodies. Right sinus exhibits no maxillary sinus tenderness and no frontal sinus tenderness. Left sinus exhibits no maxillary sinus tenderness and no frontal sinus tenderness.  Mouth/Throat: Oropharynx is clear and moist and mucous membranes are normal. Mucous membranes are not pale, not dry and not cyanotic. No uvula swelling. No oropharyngeal exudate, posterior oropharyngeal edema, posterior oropharyngeal erythema or tonsillar abscesses.  Eyes: Conjunctivae are normal. Right eye exhibits no discharge. Left eye exhibits no discharge. No scleral icterus.  Neck: Normal range of motion. Neck supple. No JVD present. No tracheal deviation present. No thyromegaly present.  Cardiovascular: Normal rate, regular rhythm, normal heart sounds and intact distal pulses.  Exam reveals no gallop and no friction rub.   No murmur heard. Pulmonary/Chest: Effort normal and breath sounds normal. No stridor. No respiratory distress. He has no wheezes. He has no rales. He exhibits no tenderness.  Abdominal: Soft. Bowel sounds are normal. He exhibits no distension and no mass. There is no tenderness. There is no rebound and no guarding.  Musculoskeletal: Normal range of motion. He exhibits no edema and no tenderness.  Lymphadenopathy:    He has no cervical adenopathy.  Neurological: He is oriented to person, place, and time.  Skin: Skin is warm and dry. No rash noted. He is not diaphoretic. No erythema. No  pallor.  Psychiatric: He has a normal mood and affect. His behavior is normal. Judgment and thought content normal.  Lab Results  Component Value Date   WBC 8.6 11/10/2009   HGB 15.2 11/10/2009   HCT 44.8 11/10/2009   PLT 191.0 11/10/2009   GLUCOSE 97 11/10/2009   CHOL 174 11/10/2009   TRIG 103.0 11/10/2009   HDL 38.20* 11/10/2009   LDLCALC 115* 11/10/2009   ALT 20 11/10/2009   AST 25 11/10/2009   NA 140 11/10/2009   K 4.3 11/10/2009   CL 107 11/10/2009   CREATININE 1.0 11/10/2009   BUN 15 11/10/2009   CO2 25 11/10/2009   TSH 5.29 04/15/2010   PSA 3.51 11/10/2009      Assessment & Plan:

## 2010-12-29 NOTE — Assessment & Plan Note (Signed)
He is having a flare of nasal allergy symptoms and wants some treatment so I have offered him an Rx for a medrol dose pak to treat the symptoms

## 2010-12-29 NOTE — Assessment & Plan Note (Signed)
I will check his FLP today 

## 2010-12-29 NOTE — Assessment & Plan Note (Signed)
I will check his TSH today 

## 2010-12-29 NOTE — Patient Instructions (Signed)

## 2011-01-02 ENCOUNTER — Encounter: Payer: Self-pay | Admitting: Internal Medicine

## 2011-02-15 ENCOUNTER — Other Ambulatory Visit: Payer: Self-pay | Admitting: Internal Medicine

## 2011-03-11 ENCOUNTER — Encounter: Payer: Self-pay | Admitting: Internal Medicine

## 2011-03-11 ENCOUNTER — Ambulatory Visit (INDEPENDENT_AMBULATORY_CARE_PROVIDER_SITE_OTHER): Payer: BC Managed Care – PPO | Admitting: Internal Medicine

## 2011-03-11 VITALS — BP 96/60 | HR 63 | Temp 97.2°F | Resp 16

## 2011-03-11 DIAGNOSIS — J019 Acute sinusitis, unspecified: Secondary | ICD-10-CM

## 2011-03-11 MED ORDER — AMOXICILLIN-POT CLAVULANATE 875-125 MG PO TABS: 1.0000 | ORAL_TABLET | Freq: Two times a day (BID) | ORAL | Status: AC

## 2011-03-11 MED ORDER — PSEUDOEPHEDRINE-CODEINE-GG 30-10-100 MG/5ML PO SOLN: 10.0000 mL | Freq: Four times a day (QID) | ORAL | Status: AC | PRN

## 2011-03-11 NOTE — Patient Instructions (Signed)

## 2011-03-11 NOTE — Assessment & Plan Note (Signed)
Start augmentin and a symptom reliever

## 2011-03-11 NOTE — Progress Notes (Signed)
Subjective:    Patient ID: Keith [name], male    DOB: [birth year], 71 y.o.   MRN: [id number]  Sinusitis This is a new problem. The current episode started 1 to 4 weeks ago. The problem has been gradually worsening since onset. There has been no fever. He is experiencing no pain. Associated symptoms include chills, congestion, coughing, sinus pressure, sneezing and a sore throat. Pertinent negatives include no diaphoresis, ear pain, headaches, hoarse voice, neck pain, shortness of breath or swollen glands. Past treatments include nothing.      Review of Systems  Constitutional: Positive for chills. Negative for fever, diaphoresis, activity change, appetite change and unexpected weight change.  HENT: Positive for congestion, sore throat, rhinorrhea, sneezing, postnasal drip and sinus pressure. Negative for hearing loss, ear pain, nosebleeds, hoarse voice, facial swelling and neck pain.   Eyes: Negative.   Respiratory: Positive for cough. Negative for apnea, choking, chest tightness, shortness of breath, wheezing and stridor.   Cardiovascular: Negative for chest pain, palpitations and leg swelling.  Gastrointestinal: Negative.   Genitourinary: Negative.   Musculoskeletal: Negative.   Skin: Negative.   Neurological: Negative for dizziness, tremors, seizures, syncope, facial asymmetry, speech difficulty, weakness, light-headedness, numbness and headaches.  Hematological: Negative for adenopathy. Does not bruise/bleed easily.  Psychiatric/Behavioral: Negative.        Objective:   Physical Exam  Vitals reviewed. Constitutional: He is oriented to person, place, and time. He appears well-developed and well-nourished. No distress.  HENT:  Head: Normocephalic and atraumatic. No trismus in the jaw.  Right Ear: Hearing, tympanic membrane, external ear and ear canal normal.  Left Ear: Hearing, tympanic membrane, external ear and ear canal normal.  Nose: Mucosal edema and rhinorrhea present. No  nose lacerations, sinus tenderness, nasal deformity, septal deviation or nasal septal hematoma. No epistaxis.  No foreign bodies. Right sinus exhibits maxillary sinus tenderness. Right sinus exhibits no frontal sinus tenderness. Left sinus exhibits maxillary sinus tenderness. Left sinus exhibits no frontal sinus tenderness.  Mouth/Throat: Uvula is midline, oropharynx is clear and moist and mucous membranes are normal. Mucous membranes are not pale, not dry and not cyanotic. No uvula swelling. No oropharyngeal exudate, posterior oropharyngeal edema, posterior oropharyngeal erythema or tonsillar abscesses.  Eyes: Conjunctivae are normal. Right eye exhibits no discharge. Left eye exhibits no discharge. No scleral icterus.  Neck: Normal range of motion. Neck supple. No JVD present. No tracheal deviation present. No thyromegaly present.  Cardiovascular: Normal rate, regular rhythm, normal heart sounds and intact distal pulses.  Exam reveals no gallop and no friction rub.   No murmur heard. Pulmonary/Chest: Effort normal and breath sounds normal. No stridor. No respiratory distress. He has no wheezes. He has no rales. He exhibits no tenderness.  Abdominal: Soft. Bowel sounds are normal. He exhibits no distension and no mass. There is no tenderness. There is no rebound and no guarding.  Musculoskeletal: Normal range of motion. He exhibits no edema and no tenderness.  Lymphadenopathy:    He has no cervical adenopathy.  Neurological: He is oriented to person, place, and time.  Skin: Skin is warm and dry. No rash noted. He is not diaphoretic. No erythema. No pallor.  Psychiatric: He has a normal mood and affect. His behavior is normal. Judgment and thought content normal.     Lab Results  Component Value Date   WBC 9.7 12/29/2010   HGB 15.7 12/29/2010   HCT 46.5 12/29/2010   PLT 201.0 12/29/2010   GLUCOSE 93 12/29/2010   CHOL 188  12/29/2010   TRIG 114.0 12/29/2010   HDL 38.20* 12/29/2010   LDLCALC  127* 12/29/2010   ALT 18 12/29/2010   AST 22 12/29/2010   NA 139 12/29/2010   K 4.4 12/29/2010   CL 105 12/29/2010   CREATININE 1.1 12/29/2010   BUN 17 12/29/2010   CO2 28 12/29/2010   TSH 4.01 12/29/2010   PSA 3.51 11/10/2009       Assessment & Plan:

## 2011-03-16 ENCOUNTER — Telehealth: Payer: Self-pay | Admitting: Internal Medicine

## 2011-03-16 DIAGNOSIS — J019 Acute sinusitis, unspecified: Secondary | ICD-10-CM

## 2011-03-16 MED ORDER — METHYLPREDNISOLONE 4 MG PO KIT: PACK | ORAL | Status: AC

## 2011-03-16 NOTE — Telephone Encounter (Signed)
Pt requesting stronger med for congestion--[facility] [address]--

## 2011-03-16 NOTE — Telephone Encounter (Signed)
done

## 2011-04-26 ENCOUNTER — Other Ambulatory Visit: Payer: Self-pay | Admitting: Internal Medicine

## 2011-06-21 ENCOUNTER — Other Ambulatory Visit: Payer: Self-pay | Admitting: Internal Medicine

## 2011-07-08 ENCOUNTER — Other Ambulatory Visit: Payer: Self-pay | Admitting: Internal Medicine

## 2011-09-21 ENCOUNTER — Other Ambulatory Visit (INDEPENDENT_AMBULATORY_CARE_PROVIDER_SITE_OTHER): Payer: BC Managed Care – PPO

## 2011-09-21 ENCOUNTER — Encounter: Payer: Self-pay | Admitting: Internal Medicine

## 2011-09-21 ENCOUNTER — Ambulatory Visit (INDEPENDENT_AMBULATORY_CARE_PROVIDER_SITE_OTHER)
Admission: RE | Admit: 2011-09-21 | Discharge: 2011-09-21 | Disposition: A | Discharge status: Home / Self Care | Payer: BC Managed Care – PPO | Source: Ambulatory Visit | Attending: Internal Medicine | Admitting: Internal Medicine

## 2011-09-21 ENCOUNTER — Ambulatory Visit (INDEPENDENT_AMBULATORY_CARE_PROVIDER_SITE_OTHER): Payer: BC Managed Care – PPO | Admitting: Internal Medicine

## 2011-09-21 VITALS — BP 120/68 | HR 61 | Temp 98.2°F | Resp 17 | Wt 202.8 lb

## 2011-09-21 DIAGNOSIS — E039 Hypothyroidism, unspecified: Secondary | ICD-10-CM

## 2011-09-21 DIAGNOSIS — M47817 Spondylosis without myelopathy or radiculopathy, lumbosacral region: Secondary | ICD-10-CM | POA: Diagnosis not present

## 2011-09-21 DIAGNOSIS — E785 Hyperlipidemia, unspecified: Secondary | ICD-10-CM

## 2011-09-21 DIAGNOSIS — R109 Unspecified abdominal pain: Secondary | ICD-10-CM

## 2011-09-21 DIAGNOSIS — M549 Dorsalgia, unspecified: Secondary | ICD-10-CM | POA: Diagnosis not present

## 2011-09-21 DIAGNOSIS — R319 Hematuria, unspecified: Secondary | ICD-10-CM | POA: Diagnosis not present

## 2011-09-21 LAB — URINALYSIS, ROUTINE W REFLEX MICROSCOPIC
Leukocytes, UA: NEGATIVE
Specific Gravity, Urine: 1.025 (ref 1.000–1.030)
Urobilinogen, UA: 0.2 (ref 0.0–1.0)

## 2011-09-21 LAB — COMPREHENSIVE METABOLIC PANEL
ALT: 18 U/L (ref 0–53)
AST: 25 U/L (ref 0–37)
Albumin: 3.8 g/dL (ref 3.5–5.2)
CO2: 26 mEq/L (ref 19–32)
Calcium: 8.8 mg/dL (ref 8.4–10.5)
Chloride: 106 mEq/L (ref 96–112)
Creatinine, Ser: 0.9 mg/dL (ref 0.4–1.5)
GFR: 84.33 mL/min (ref >60.00)
Potassium: 3.9 mEq/L (ref 3.5–5.1)

## 2011-09-21 LAB — CBC WITH DIFFERENTIAL/PLATELET
Basophils Absolute: 0.1 10*3/uL (ref 0.0–0.1)
Eosinophils Absolute: 0.4 10*3/uL (ref 0.0–0.7)
Hemoglobin: 15 g/dL (ref 13.0–17.0)
Lymphocytes Relative: 25.6 % (ref 12.0–46.0)
MCHC: 32.4 g/dL (ref 30.0–36.0)
Monocytes Relative: 7.4 % (ref 3.0–12.0)
Neutro Abs: 5.7 10*3/uL (ref 1.4–7.7)
Neutrophils Relative %: 61.9 % (ref 43.0–77.0)
Platelets: 188 10*3/uL (ref 150.0–400.0)
RDW: 14.3 % (ref 11.5–14.6)

## 2011-09-21 LAB — TSH: TSH: 8.51 u[IU]/mL — ABNORMAL HIGH (ref 0.35–5.50)

## 2011-09-21 LAB — SEDIMENTATION RATE: Sed Rate: 11 mm/hr (ref 0–22)

## 2011-09-21 NOTE — Patient Instructions (Signed)
Back Pain, Adult Low back pain is very common. About 1 in 5 people have back pain.The cause of low back pain is rarely dangerous. The pain often gets better over time.About half of people with a sudden onset of back pain feel better in just 2 weeks. About 8 in 10 people feel better by 6 weeks.  CAUSES Some common causes of back pain include:  Strain of the muscles or ligaments supporting the spine.   Wear and tear (degeneration) of the spinal discs.   Arthritis.   Direct injury to the back.  DIAGNOSIS Most of the time, the direct cause of low back pain is not known.However, back pain can be treated effectively even when the exact cause of the pain is unknown.Answering your caregiver's questions about your overall health and symptoms is one of the most accurate ways to make sure the cause of your pain is not dangerous. If your caregiver needs more information, he or she may order lab work or imaging tests (X-rays or MRIs).However, even if imaging tests show changes in your back, this usually does not require surgery. HOME CARE INSTRUCTIONS For many people, back pain returns.Since low back pain is rarely dangerous, it is often a condition that people can learn to manageon their own.   Remain active. It is stressful on the back to sit or stand in one place. Do not sit, drive, or stand in one place for more than 30 minutes at a time. Take short walks on level surfaces as soon as pain allows.Try to increase the length of time you walk each day.   Do not stay in bed.Resting more than 1 or 2 days can delay your recovery.   Do not avoid exercise or work.Your body is made to move.It is not dangerous to be active, even though your back may hurt.Your back will likely heal faster if you return to being active before your pain is gone.   Pay attention to your body when you bend and lift. Many people have less discomfortwhen lifting if they bend their knees, keep the load close to their  bodies,and avoid twisting. Often, the most comfortable positions are those that put less stress on your recovering back.   Find a comfortable position to sleep. Use a firm mattress and lie on your side with your knees slightly bent. If you lie on your back, put a pillow under your knees.   Only take over-the-counter or prescription medicines as directed by your caregiver. Over-the-counter medicines to reduce pain and inflammation are often the most helpful.Your caregiver may prescribe muscle relaxant drugs.These medicines help dull your pain so you can more quickly return to your normal activities and healthy exercise.   Put ice on the injured area.   Put ice in a plastic bag.   Place a towel between your skin and the bag.   Leave the ice on for 15 to 20 minutes, 3 to 4 times a day for the first 2 to 3 days. After that, ice and heat may be alternated to reduce pain and spasms.   Ask your caregiver about trying back exercises and gentle massage. This may be of some benefit.   Avoid feeling anxious or stressed.Stress increases muscle tension and can worsen back pain.It is important to recognize when you are anxious or stressed and learn ways to manage it.Exercise is a great option.  SEEK MEDICAL CARE IF:  You have pain that is not relieved with rest or medicine.   You have   pain that does not improve in 1 week.   You have new symptoms.   You are generally not feeling well.  SEEK IMMEDIATE MEDICAL CARE IF:   You have pain that radiates from your back into your legs.   You develop new bowel or bladder control problems.   You have unusual weakness or numbness in your arms or legs.   You develop nausea or vomiting.   You develop abdominal pain.   You feel faint.  Document Released: 01/24/2005 Document Revised: 01/13/2011 Document Reviewed: 06/14/2010 [employer] Patient Information 2012 [employer]. 

## 2011-09-22 ENCOUNTER — Encounter: Payer: Self-pay | Admitting: Internal Medicine

## 2011-09-22 DIAGNOSIS — R319 Hematuria, unspecified: Secondary | ICD-10-CM | POA: Insufficient documentation

## 2011-09-22 DIAGNOSIS — R109 Unspecified abdominal pain: Secondary | ICD-10-CM | POA: Insufficient documentation

## 2011-09-22 MED ORDER — LEVOTHYROXINE SODIUM 137 MCG PO TABS: 137.0000 ug | ORAL_TABLET | Freq: Every day | ORAL | Status: DC

## 2011-09-22 NOTE — Assessment & Plan Note (Signed)
Plain film shows mild arthritis and he has no radicular symptoms. He will continue taking nsaids as needed.

## 2011-09-22 NOTE — Assessment & Plan Note (Signed)
TSH is a little high so I have increased the dose of his synthroid

## 2011-09-22 NOTE — Assessment & Plan Note (Signed)
I have ordered a CT scan of his abd/pelvis

## 2011-09-22 NOTE — Assessment & Plan Note (Signed)
Ct ordered to see if he has a stone

## 2011-09-22 NOTE — Progress Notes (Signed)
Subjective:    Patient ID: Keith [name], male    DOB: 12-03-1940, 71 y.o.   MRN: [id number]  Back Pain This is a new problem. The current episode started 1 to 4 weeks ago. The problem occurs intermittently. The problem is unchanged. The pain is present in the lumbar spine (right low back and right flank). The quality of the pain is described as shooting and stabbing. The pain does not radiate. The pain is at a severity of 2/10. The pain is mild. The pain is the same all the time. Pertinent negatives include no abdominal pain, bladder incontinence, bowel incontinence, chest pain, dysuria, fever, headaches, leg pain, numbness, paresis, paresthesias, pelvic pain, perianal numbness, tingling, weakness or weight loss. He has tried NSAIDs for the symptoms. The treatment provided moderate relief.      Review of Systems  Constitutional: Negative for fever, chills, weight loss, diaphoresis, activity change, appetite change, fatigue and unexpected weight change.  HENT: Negative.   Eyes: Negative.   Respiratory: Negative for cough, chest tightness, wheezing and stridor.   Cardiovascular: Negative for chest pain, palpitations and leg swelling.  Gastrointestinal: Negative for nausea, vomiting, abdominal pain, diarrhea, constipation, abdominal distention and bowel incontinence.  Genitourinary: Positive for flank pain. Negative for bladder incontinence, dysuria, urgency, frequency, hematuria, decreased urine volume, discharge, scrotal swelling, enuresis, difficulty urinating, testicular pain and pelvic pain.  Musculoskeletal: Positive for back pain. Negative for myalgias, joint swelling, arthralgias and gait problem.  Skin: Negative for color change, pallor, rash and wound.  Neurological: Negative for dizziness, tingling, tremors, seizures, syncope, facial asymmetry, speech difficulty, weakness, light-headedness, numbness, headaches and paresthesias.  Hematological: Negative for adenopathy. Does not  bruise/bleed easily.  Psychiatric/Behavioral: Negative.        Objective:   Physical Exam  Vitals reviewed. Constitutional: He is oriented to person, place, and time. He appears well-developed and well-nourished.  Non-toxic appearance. He does not have a sickly appearance. He does not appear ill. No distress.  HENT:  Head: Normocephalic and atraumatic.  Mouth/Throat: Oropharynx is clear and moist. No oropharyngeal exudate.  Eyes: Conjunctivae are normal. Right eye exhibits no discharge. Left eye exhibits no discharge. No scleral icterus.  Neck: Normal range of motion. Neck supple. No JVD present. No tracheal deviation present. No thyromegaly present.  Cardiovascular: Normal rate, regular rhythm, normal heart sounds and intact distal pulses.  Exam reveals no gallop and no friction rub.   No murmur heard. Pulmonary/Chest: Effort normal and breath sounds normal. No stridor. No respiratory distress. He has no wheezes. He has no rales. He exhibits no tenderness.  Abdominal: Soft. Normal appearance and bowel sounds are normal. He exhibits no shifting dullness, no distension, no pulsatile liver, no fluid wave, no abdominal bruit, no ascites, no pulsatile midline mass and no mass. There is no hepatosplenomegaly, splenomegaly or hepatomegaly. There is no tenderness. There is no rigidity, no rebound, no guarding, no CVA tenderness, no tenderness at McBurney's point and negative Murphy's sign. No hernia. Hernia confirmed negative in the ventral area, confirmed negative in the right inguinal area and confirmed negative in the left inguinal area.  Musculoskeletal: Normal range of motion. He exhibits no edema and no tenderness.       Lumbar back: Normal. He exhibits normal range of motion, no tenderness, no bony tenderness, no swelling, no edema, no deformity, no laceration, no pain, no spasm and normal pulse.  Lymphadenopathy:    He has no cervical adenopathy.  Neurological: He is oriented to person, place,  and time.  Skin: Skin is warm and dry. No rash noted. He is not diaphoretic. No erythema. No pallor.  Psychiatric: He has a normal mood and affect. His behavior is normal. Judgment and thought content normal.      Lab Results  Component Value Date   WBC 9.3 09/21/2011   HGB 15.0 09/21/2011   HCT 46.4 09/21/2011   PLT 188.0 09/21/2011   GLUCOSE 96 09/21/2011   CHOL 188 12/29/2010   TRIG 114.0 12/29/2010   HDL 38.20* 12/29/2010   LDLCALC 127* 12/29/2010   ALT 18 09/21/2011   AST 25 09/21/2011   NA 140 09/21/2011   K 3.9 09/21/2011   CL 106 09/21/2011   CREATININE 0.9 09/21/2011   BUN 13 09/21/2011   CO2 26 09/21/2011   TSH 8.51* 09/21/2011   PSA 3.51 11/10/2009      Assessment & Plan:

## 2011-09-23 ENCOUNTER — Ambulatory Visit (INDEPENDENT_AMBULATORY_CARE_PROVIDER_SITE_OTHER)
Admission: RE | Admit: 2011-09-23 | Discharge: 2011-09-23 | Disposition: A | Discharge status: Home / Self Care | Payer: Medicare Other | Source: Ambulatory Visit | Attending: Internal Medicine | Admitting: Internal Medicine

## 2011-09-23 ENCOUNTER — Telehealth: Payer: Self-pay | Admitting: Internal Medicine

## 2011-09-23 DIAGNOSIS — K5730 Diverticulosis of large intestine without perforation or abscess without bleeding: Secondary | ICD-10-CM | POA: Diagnosis not present

## 2011-09-23 DIAGNOSIS — R319 Hematuria, unspecified: Secondary | ICD-10-CM | POA: Diagnosis not present

## 2011-09-23 DIAGNOSIS — M549 Dorsalgia, unspecified: Secondary | ICD-10-CM

## 2011-09-23 DIAGNOSIS — R109 Unspecified abdominal pain: Secondary | ICD-10-CM

## 2011-09-23 DIAGNOSIS — R3129 Other microscopic hematuria: Secondary | ICD-10-CM | POA: Diagnosis not present

## 2011-09-23 MED ORDER — TRAMADOL HCL 50 MG PO TABS: 50.0000 mg | ORAL_TABLET | Freq: Three times a day (TID) | ORAL | Status: AC | PRN

## 2011-09-23 NOTE — Telephone Encounter (Signed)
done

## 2011-09-26 ENCOUNTER — Telehealth: Payer: Self-pay | Admitting: Internal Medicine

## 2011-09-26 DIAGNOSIS — E039 Hypothyroidism, unspecified: Secondary | ICD-10-CM

## 2011-09-26 MED ORDER — LEVOTHYROXINE SODIUM 137 MCG PO TABS: 137.0000 ug | ORAL_TABLET | Freq: Every day | ORAL | Status: DC

## 2011-09-26 NOTE — Telephone Encounter (Signed)
RX was sent escript on 09/22/11 to [facility], will resend to [facility].

## 2011-09-26 NOTE — Telephone Encounter (Signed)
The pt called hoping to get a stronger rx of his Synthroid called into the [facility] pharmacy.  I advised him to call the pharmacy and have them electronically request this, but the patient refused stating he would not do that.

## 2011-11-09 ENCOUNTER — Ambulatory Visit (INDEPENDENT_AMBULATORY_CARE_PROVIDER_SITE_OTHER): Payer: BC Managed Care – PPO

## 2011-11-09 DIAGNOSIS — Z23 Encounter for immunization: Secondary | ICD-10-CM | POA: Diagnosis not present

## 2011-11-17 ENCOUNTER — Other Ambulatory Visit (INDEPENDENT_AMBULATORY_CARE_PROVIDER_SITE_OTHER): Payer: BC Managed Care – PPO

## 2011-11-17 ENCOUNTER — Ambulatory Visit (INDEPENDENT_AMBULATORY_CARE_PROVIDER_SITE_OTHER): Payer: BC Managed Care – PPO | Admitting: Internal Medicine

## 2011-11-17 ENCOUNTER — Encounter: Payer: Self-pay | Admitting: Internal Medicine

## 2011-11-17 VITALS — BP 108/70 | HR 63 | Temp 98.7°F | Resp 16 | Wt 202.0 lb

## 2011-11-17 DIAGNOSIS — R0989 Other specified symptoms and signs involving the circulatory and respiratory systems: Secondary | ICD-10-CM

## 2011-11-17 DIAGNOSIS — M169 Osteoarthritis of hip, unspecified: Secondary | ICD-10-CM | POA: Diagnosis not present

## 2011-11-17 DIAGNOSIS — E039 Hypothyroidism, unspecified: Secondary | ICD-10-CM

## 2011-11-17 DIAGNOSIS — F4321 Adjustment disorder with depressed mood: Secondary | ICD-10-CM | POA: Insufficient documentation

## 2011-11-17 DIAGNOSIS — R0609 Other forms of dyspnea: Secondary | ICD-10-CM

## 2011-11-17 LAB — CBC WITH DIFFERENTIAL/PLATELET
Basophils Relative: 0.6 % (ref 0.0–3.0)
Eosinophils Relative: 4.1 % (ref 0.0–5.0)
Lymphocytes Relative: 22.7 % (ref 12.0–46.0)
Monocytes Relative: 7.9 % (ref 3.0–12.0)
Neutrophils Relative %: 64.7 % (ref 43.0–77.0)
Platelets: 196 10*3/uL (ref 150.0–400.0)
RBC: 4.97 Mil/uL (ref 4.22–5.81)
WBC: 9.5 10*3/uL (ref 4.5–10.5)

## 2011-11-17 LAB — COMPREHENSIVE METABOLIC PANEL
Albumin: 3.8 g/dL (ref 3.5–5.2)
Alkaline Phosphatase: 40 U/L (ref 39–117)
CO2: 28 mEq/L (ref 19–32)
Calcium: 8.9 mg/dL (ref 8.4–10.5)
Chloride: 104 mEq/L (ref 96–112)
Glucose, Bld: 89 mg/dL (ref 70–99)
Potassium: 4.6 mEq/L (ref 3.5–5.1)
Sodium: 137 mEq/L (ref 135–145)
Total Protein: 7 g/dL (ref 6.0–8.3)

## 2011-11-17 LAB — CARDIAC PANEL: Total CK: 275 U/L — ABNORMAL HIGH (ref 7–232)

## 2011-11-17 LAB — TSH: TSH: 8.52 u[IU]/mL — ABNORMAL HIGH (ref 0.35–5.50)

## 2011-11-17 MED ORDER — LEVOTHYROXINE SODIUM 150 MCG PO TABS: 150.0000 ug | ORAL_TABLET | Freq: Every day | ORAL | Status: DC

## 2011-11-17 MED ORDER — METHYLPREDNISOLONE ACETATE 80 MG/ML IJ SUSP
120.0000 mg | Freq: Once | INTRAMUSCULAR | Status: AC
  Administered 2011-11-17: 120 mg via INTRAMUSCULAR

## 2011-11-17 NOTE — Patient Instructions (Signed)

## 2011-11-17 NOTE — Assessment & Plan Note (Signed)
His EKG is normal, I will check labs today to see if he has an anemia, CHF, cardiac ischemia, PE, hypo T, renal failure, abnormal lytes

## 2011-11-17 NOTE — Assessment & Plan Note (Signed)
I have asked him to see a therapist for grief counseling

## 2011-11-17 NOTE — Progress Notes (Signed)
Subjective:    Patient ID: Keith [name], male    DOB: [birth year], 71 y.o.   MRN: [id number]  Shortness of Breath This is a recurrent problem. The current episode started more than 1 month ago. The problem occurs intermittently. The problem has been unchanged. Pertinent negatives include no abdominal pain, chest pain, claudication, coryza, ear pain, fever, headaches, hemoptysis, leg pain, leg swelling, neck pain, orthopnea, PND, rash, rhinorrhea, sore throat, sputum production, swollen glands, syncope, vomiting or wheezing. The symptoms are aggravated by emotional upset. Risk factors include no known risk factors. He has tried nothing for the symptoms. There is no history of allergies, aspirin allergies, asthma, bronchiolitis, CAD, chronic lung disease, COPD, DVT, a heart failure, PE, pneumonia or a recent surgery.      Review of Systems  Constitutional: Negative for fever, chills, diaphoresis, activity change, appetite change, fatigue and unexpected weight change.  HENT: Negative.  Negative for ear pain, sore throat, rhinorrhea and neck pain.   Eyes: Negative.   Respiratory: Positive for shortness of breath. Negative for apnea, cough, hemoptysis, sputum production, choking, chest tightness and wheezing.   Cardiovascular: Negative for chest pain, palpitations, orthopnea, claudication, leg swelling, syncope and PND.  Gastrointestinal: Negative for nausea, vomiting, abdominal pain, diarrhea, constipation, blood in stool and anal bleeding.  Genitourinary: Negative.   Musculoskeletal: Positive for arthralgias (left hip). Negative for myalgias, back pain, joint swelling and gait problem.  Skin: Negative for color change, pallor, rash and wound.  Neurological: Negative for dizziness, tremors, seizures, syncope, facial asymmetry, speech difficulty, weakness, light-headedness, numbness and headaches.  Hematological: Negative for adenopathy. Does not bruise/bleed easily.  Psychiatric/Behavioral: Positive  for dysphoric mood (crying spells, feels overwhelmed). Negative for suicidal ideas, hallucinations, behavioral problems, confusion, disturbed wake/sleep cycle, self-injury, decreased concentration and agitation. The patient is not nervous/anxious and is not hyperactive.        Objective:   Physical Exam  Vitals reviewed. Constitutional: He is oriented to person, place, and time. He appears well-developed and well-nourished. No distress.  HENT:  Head: Normocephalic and atraumatic.  Mouth/Throat: Oropharynx is clear and moist. No oropharyngeal exudate.  Eyes: Conjunctivae normal are normal. Right eye exhibits no discharge. Left eye exhibits no discharge. No scleral icterus.  Neck: Normal range of motion. Neck supple. No JVD present. No tracheal deviation present. No thyromegaly present.  Cardiovascular: Normal rate, regular rhythm, normal heart sounds and intact distal pulses.  Exam reveals no gallop and no friction rub.   No murmur heard. Pulmonary/Chest: Effort normal and breath sounds normal. No stridor. No respiratory distress. He has no wheezes. He has no rales. He exhibits no tenderness.  Abdominal: Soft. Bowel sounds are normal. He exhibits no distension and no mass. There is no tenderness. There is no rebound and no guarding.  Musculoskeletal: Normal range of motion. He exhibits no edema and no tenderness.       Left hip: Normal. He exhibits normal range of motion, normal strength, no tenderness, no bony tenderness, no swelling, no crepitus, no deformity and no laceration.  Lymphadenopathy:    He has no cervical adenopathy.  Neurological: He is oriented to person, place, and time.  Skin: Skin is warm and dry. No rash noted. He is not diaphoretic. No erythema. No pallor.  Psychiatric: His speech is normal and behavior is normal. Judgment and thought content normal. His mood appears not anxious. His affect is not angry, not blunt, not labile and not inappropriate. Cognition and memory are  normal. He exhibits a depressed mood (  tearful).      Lab Results  Component Value Date   WBC 9.3 09/21/2011   HGB 15.0 09/21/2011   HCT 46.4 09/21/2011   PLT 188.0 09/21/2011   GLUCOSE 96 09/21/2011   CHOL 188 12/29/2010   TRIG 114.0 12/29/2010   HDL 38.20* 12/29/2010   LDLCALC 127* 12/29/2010   ALT 18 09/21/2011   AST 25 09/21/2011   NA 140 09/21/2011   K 3.9 09/21/2011   CL 106 09/21/2011   CREATININE 0.9 09/21/2011   BUN 13 09/21/2011   CO2 26 09/21/2011   TSH 8.51* 09/21/2011   PSA 3.51 11/10/2009     Assessment & Plan:

## 2011-11-17 NOTE — Assessment & Plan Note (Signed)
Depo-medrol injection for relief of his pain

## 2011-11-17 NOTE — Assessment & Plan Note (Signed)
TSH today, will adjust the does if needed

## 2011-11-17 NOTE — Addendum Note (Signed)
Addended by: [name] on: 11/17/2011 02:57 PM   Modules accepted: Orders

## 2011-11-18 ENCOUNTER — Encounter: Payer: Self-pay | Admitting: Internal Medicine

## 2011-11-21 ENCOUNTER — Telehealth: Payer: Self-pay | Admitting: Internal Medicine

## 2011-11-21 NOTE — Telephone Encounter (Signed)
The patient called hoping to get a refill of synthroid, but he is hoping for a higher dose.  Patient callback - [phone]   Thanks!

## 2011-11-21 NOTE — Telephone Encounter (Signed)
An Rx for a higher dose was sent to his pharmacy last week

## 2011-11-21 NOTE — Telephone Encounter (Signed)
Pt advised.

## 2011-11-28 ENCOUNTER — Ambulatory Visit (INDEPENDENT_AMBULATORY_CARE_PROVIDER_SITE_OTHER): Payer: BC Managed Care – PPO | Admitting: Licensed Clinical Social Worker

## 2011-11-28 DIAGNOSIS — F331 Major depressive disorder, recurrent, moderate: Secondary | ICD-10-CM

## 2011-12-12 ENCOUNTER — Ambulatory Visit (INDEPENDENT_AMBULATORY_CARE_PROVIDER_SITE_OTHER): Payer: Medicare Other | Admitting: Licensed Clinical Social Worker

## 2011-12-12 DIAGNOSIS — F4323 Adjustment disorder with mixed anxiety and depressed mood: Secondary | ICD-10-CM | POA: Diagnosis not present

## 2011-12-15 ENCOUNTER — Telehealth: Payer: Self-pay | Admitting: Internal Medicine

## 2011-12-15 ENCOUNTER — Other Ambulatory Visit (INDEPENDENT_AMBULATORY_CARE_PROVIDER_SITE_OTHER): Payer: BC Managed Care – PPO

## 2011-12-15 ENCOUNTER — Encounter: Payer: Self-pay | Admitting: Internal Medicine

## 2011-12-15 DIAGNOSIS — N402 Nodular prostate without lower urinary tract symptoms: Secondary | ICD-10-CM | POA: Diagnosis not present

## 2011-12-15 DIAGNOSIS — N400 Enlarged prostate without lower urinary tract symptoms: Secondary | ICD-10-CM

## 2011-12-15 NOTE — Telephone Encounter (Signed)
Pt ware to come in for lab today/pn

## 2011-12-15 NOTE — Telephone Encounter (Signed)
done

## 2011-12-15 NOTE — Telephone Encounter (Signed)
Pt called and stated that he had lab work done back in 09/21/11, but didn't not get the PSA done. Pt is req lab order for PSA, please call pt if this is ok.

## 2012-01-26 ENCOUNTER — Other Ambulatory Visit: Payer: Self-pay

## 2012-01-26 DIAGNOSIS — E039 Hypothyroidism, unspecified: Secondary | ICD-10-CM

## 2012-01-26 MED ORDER — LEVOTHYROXINE SODIUM 150 MCG PO TABS: 150.0000 ug | ORAL_TABLET | Freq: Every day | ORAL | Status: DC

## 2012-04-25 ENCOUNTER — Ambulatory Visit (INDEPENDENT_AMBULATORY_CARE_PROVIDER_SITE_OTHER): Payer: BC Managed Care – PPO | Admitting: Internal Medicine

## 2012-04-25 ENCOUNTER — Encounter: Payer: Self-pay | Admitting: Internal Medicine

## 2012-04-25 ENCOUNTER — Other Ambulatory Visit (INDEPENDENT_AMBULATORY_CARE_PROVIDER_SITE_OTHER): Payer: Medicare Other

## 2012-04-25 ENCOUNTER — Ambulatory Visit (INDEPENDENT_AMBULATORY_CARE_PROVIDER_SITE_OTHER)
Admission: RE | Admit: 2012-04-25 | Discharge: 2012-04-25 | Disposition: A | Discharge status: Home / Self Care | Payer: BC Managed Care – PPO | Source: Ambulatory Visit | Attending: Internal Medicine | Admitting: Internal Medicine

## 2012-04-25 VITALS — BP 100/62 | HR 75 | Temp 97.0°F | Resp 16 | Wt 201.5 lb

## 2012-04-25 DIAGNOSIS — E039 Hypothyroidism, unspecified: Secondary | ICD-10-CM

## 2012-04-25 DIAGNOSIS — R0609 Other forms of dyspnea: Secondary | ICD-10-CM

## 2012-04-25 DIAGNOSIS — G8929 Other chronic pain: Secondary | ICD-10-CM

## 2012-04-25 DIAGNOSIS — R109 Unspecified abdominal pain: Secondary | ICD-10-CM | POA: Diagnosis not present

## 2012-04-25 DIAGNOSIS — R0989 Other specified symptoms and signs involving the circulatory and respiratory systems: Secondary | ICD-10-CM

## 2012-04-25 DIAGNOSIS — E785 Hyperlipidemia, unspecified: Secondary | ICD-10-CM | POA: Diagnosis not present

## 2012-04-25 LAB — COMPREHENSIVE METABOLIC PANEL
AST: 25 U/L (ref 0–37)
Albumin: 3.8 g/dL (ref 3.5–5.2)
BUN: 14 mg/dL (ref 6–23)
CO2: 28 mEq/L (ref 19–32)
Calcium: 9 mg/dL (ref 8.4–10.5)
Chloride: 105 mEq/L (ref 96–112)
Potassium: 4.4 mEq/L (ref 3.5–5.1)

## 2012-04-25 LAB — CBC WITH DIFFERENTIAL/PLATELET
Basophils Absolute: 0.1 10*3/uL (ref 0.0–0.1)
Eosinophils Absolute: 0.2 10*3/uL (ref 0.0–0.7)
Lymphocytes Relative: 19.7 % (ref 12.0–46.0)
MCHC: 34 g/dL (ref 30.0–36.0)
Neutrophils Relative %: 68.2 % (ref 43.0–77.0)
RBC: 5.05 Mil/uL (ref 4.22–5.81)
RDW: 13.8 % (ref 11.5–14.6)

## 2012-04-25 LAB — TSH: TSH: 0.66 u[IU]/mL (ref 0.35–5.50)

## 2012-04-25 LAB — URINALYSIS, ROUTINE W REFLEX MICROSCOPIC
Bilirubin Urine: NEGATIVE
Nitrite: NEGATIVE
Urobilinogen, UA: 0.2 (ref 0.0–1.0)

## 2012-04-25 LAB — LIPID PANEL
Cholesterol: 172 mg/dL (ref 0–200)
LDL Cholesterol: 103 mg/dL — ABNORMAL HIGH (ref 0–99)

## 2012-04-25 NOTE — Progress Notes (Signed)
Subjective:    Patient ID: Keith [name], male    DOB: December 02, 1940, 72 y.o.   MRN: [id number]  Flank Pain This is a recurrent problem. The current episode started more than 1 month ago. The problem occurs intermittently. The problem is unchanged. Pain location: bilateral flank area. The quality of the pain is described as aching. The pain does not radiate. The pain is at a severity of 1/10. The pain is mild. The pain is worse during the day. The symptoms are aggravated by bending and twisting. Associated symptoms include abdominal pain. Pertinent negatives include no bladder incontinence, bowel incontinence, chest pain, dysuria, fever, headaches, leg pain, numbness, paresis, paresthesias, pelvic pain, perianal numbness, tingling, weakness or weight loss. Risk factors include obesity. He has tried nothing for the symptoms. The treatment provided no relief.      Review of Systems  Constitutional: Negative.  Negative for fever, chills, weight loss, diaphoresis, activity change, appetite change, fatigue and unexpected weight change.  HENT: Negative.   Eyes: Negative.   Respiratory: Positive for shortness of breath (worsening DOE). Negative for apnea, cough, choking, chest tightness, wheezing and stridor.   Cardiovascular: Negative.  Negative for chest pain, palpitations and leg swelling.  Gastrointestinal: Positive for abdominal pain. Negative for nausea, vomiting, diarrhea, constipation, blood in stool, abdominal distention, anal bleeding, rectal pain and bowel incontinence.  Endocrine: Negative.   Genitourinary: Positive for flank pain. Negative for bladder incontinence, dysuria, urgency, frequency, hematuria, decreased urine volume, enuresis and pelvic pain.  Musculoskeletal: Positive for back pain. Negative for myalgias, joint swelling, arthralgias and gait problem.  Skin: Negative.   Allergic/Immunologic: Negative.   Neurological: Negative.  Negative for dizziness, tingling, tremors, weakness,  light-headedness, numbness, headaches and paresthesias.  Hematological: Negative for adenopathy. Does not bruise/bleed easily.  Psychiatric/Behavioral: Negative.        Objective:   Physical Exam  Vitals reviewed. Constitutional: He is oriented to person, place, and time. He appears well-developed and well-nourished.  Non-toxic appearance. He does not have a sickly appearance. He does not appear ill. No distress.  HENT:  Head: Normocephalic and atraumatic.  Mouth/Throat: Oropharynx is clear and moist. No oropharyngeal exudate.  Eyes: Conjunctivae are normal. Right eye exhibits no discharge. Left eye exhibits no discharge. No scleral icterus.  Neck: Normal range of motion. Neck supple. No JVD present. No tracheal deviation present. No thyromegaly present.  Cardiovascular: Normal rate, regular rhythm, normal heart sounds and intact distal pulses.  Exam reveals no gallop and no friction rub.   No murmur heard. Pulmonary/Chest: Effort normal and breath sounds normal. No stridor. No respiratory distress. He has no wheezes. He has no rales. He exhibits no tenderness.  Abdominal: Soft. Bowel sounds are normal. He exhibits no distension and no mass. There is no tenderness. There is no rebound and no guarding.  Musculoskeletal: Normal range of motion. He exhibits no edema and no tenderness.  Lymphadenopathy:    He has no cervical adenopathy.  Neurological: He is oriented to person, place, and time.  Skin: Skin is warm and dry. No rash noted. He is not diaphoretic. No erythema. No pallor.  Psychiatric: He has a normal mood and affect. His behavior is normal. Judgment and thought content normal.     Lab Results  Component Value Date   WBC 9.5 11/17/2011   HGB 15.3 11/17/2011   HCT 45.9 11/17/2011   PLT 196.0 11/17/2011   GLUCOSE 89 11/17/2011   CHOL 188 12/29/2010   TRIG 114.0 12/29/2010   HDL 38.20*  12/29/2010   LDLCALC 127* 12/29/2010   ALT 21 11/17/2011   AST 29 11/17/2011   NA 137  11/17/2011   K 4.6 11/17/2011   CL 104 11/17/2011   CREATININE 1.0 11/17/2011   BUN 16 11/17/2011   CO2 28 11/17/2011   TSH 8.52* 11/17/2011   PSA 3.85 12/15/2011       Assessment & Plan:

## 2012-04-25 NOTE — Patient Instructions (Signed)
Abdominal Pain  Abdominal pain can be caused by many things. Your caregiver decides the seriousness of your pain by an examination and possibly blood tests and X-rays. Many cases can be observed and treated at home. Most abdominal pain is not caused by a disease and will probably improve without treatment. However, in many cases, more time must pass before a clear cause of the pain can be found. Before that point, it may not be known if you need more testing, or if hospitalization or surgery is needed.  HOME CARE INSTRUCTIONS   · Do not take laxatives unless directed by your caregiver.  · Take pain medicine only as directed by your caregiver.  · Only take over-the-counter or prescription medicines for pain, discomfort, or fever as directed by your caregiver.  · Try a clear liquid diet (broth, tea, or water) for as long as directed by your caregiver. Slowly move to a bland diet as tolerated.  SEEK IMMEDIATE MEDICAL CARE IF:   · The pain does not go away.  · You have a fever.  · You keep throwing up (vomiting).  · The pain is felt only in portions of the abdomen. Pain in the right side could possibly be appendicitis. In an adult, pain in the left lower portion of the abdomen could be colitis or diverticulitis.  · You pass bloody or black tarry stools.  MAKE SURE YOU:   · Understand these instructions.  · Will watch your condition.  · Will get help right away if you are not doing well or get worse.  Document Released: 11/03/2004 Document Revised: 04/18/2011 Document Reviewed: 09/12/2007  ExitCare® Patient Information ©2013 [employer].

## 2012-04-26 NOTE — Assessment & Plan Note (Addendum)
EKG is remarkable for sinus bradycardia but no q waves, lvh, ischemia (unchanged from the prior EKG) I will check his labs today to see if there are some metabolic causes to explain DOE I have asked him to get an ETT done to screen for ischemia

## 2012-04-26 NOTE — Assessment & Plan Note (Signed)
I will check a plain film to see if he has renal stones, sbo, ileus, etc Will check his UA and labs to look for other causes as well

## 2012-04-26 NOTE — Assessment & Plan Note (Signed)
I will check his TSH today and will adjust his dose if needed 

## 2012-04-30 ENCOUNTER — Other Ambulatory Visit: Payer: Self-pay | Admitting: Internal Medicine

## 2012-05-01 ENCOUNTER — Other Ambulatory Visit: Payer: Self-pay | Admitting: Internal Medicine

## 2012-05-03 ENCOUNTER — Telehealth: Payer: Self-pay | Admitting: Internal Medicine

## 2012-05-03 NOTE — Telephone Encounter (Signed)
done

## 2012-05-03 NOTE — Telephone Encounter (Signed)
Pt called req referral for cardiologist due to discomfort around chest area. Please advise

## 2012-05-25 ENCOUNTER — Encounter: Payer: Medicare Other | Admitting: Physician Assistant

## 2012-05-30 ENCOUNTER — Encounter: Payer: Medicare Other | Admitting: Physician Assistant

## 2012-06-05 ENCOUNTER — Institutional Professional Consult (permissible substitution): Payer: Medicare Other | Admitting: Cardiovascular Disease

## 2012-07-30 ENCOUNTER — Ambulatory Visit (INDEPENDENT_AMBULATORY_CARE_PROVIDER_SITE_OTHER): Payer: Medicare Other | Admitting: Physician Assistant

## 2012-07-30 DIAGNOSIS — R0989 Other specified symptoms and signs involving the circulatory and respiratory systems: Secondary | ICD-10-CM

## 2012-07-30 NOTE — Progress Notes (Signed)
Keith [name] is a 72 y.o. male referred by his PCP for DOE.  No hx of CAD.  PMH: + HL.  Never a smoker.  + FHx CAD: father died 77 yo of MI.  Patient notes DOE for last 5 years. No worse.  No CP.  No syncope.  ECG:  NSR, no ST changes.  Exam unremarkable.  Exercise Treadmill Test  Pre-Exercise Testing Evaluation Rhythm: normal sinus  Rate: 61     Test  Exercise Tolerance Test Ordering MD: Kristeen Miss, MD  Interpreting MD: Tereso Newcomer, PA-C  Unique Test No: 1  Treadmill:  1  Indication for ETT: exertional dyspnea  Contraindication to ETT: No   Stress Modality: exercise - treadmill  Cardiac Imaging Performed: non   Protocol: standard Bruce - maximal  Max BP:  185/88  Max MPHR (bpm):  150 85% MPR (bpm):  128  MPHR obtained (bpm):  171 % MPHR obtained:  114  Reached 85% MPHR (min:sec):  6:23 Total Exercise Time (min-sec):  8:00  Workload in METS:  10.1 Borg Scale: 15  Reason ETT Terminated:  patient's desire to stop    ST Segment Analysis At Rest: normal ST segments - no evidence of significant ST depression With Exercise: no evidence of significant ST depression  Other Information Arrhythmia:  Yes Angina during ETT:  absent (0) Quality of ETT:  diagnostic  ETT Interpretation:  normal - no evidence of ischemia by ST analysis  Comments: Good exercise tolerance. No chest pain. Normal BP response to exercise. No ST-T changes to suggest ischemia.  Patient did have some PACs, brief runs of ATach and one run of SVT/ATach at the end of exercise with max HR 171 that lasted less than 10 seconds.  Asymptomatic. O2 sat 96% => dropped to 93% at max exercise.  Recommendations: F/u with Sanda Linger, MD as directed. Consider event monitor if patient has hx of palpitations. Signed,  Tereso Newcomer, PA-C   07/30/2012 11:35 AM

## 2012-08-06 ENCOUNTER — Institutional Professional Consult (permissible substitution): Payer: Medicare Other | Admitting: Cardiovascular Disease

## 2012-08-07 ENCOUNTER — Institutional Professional Consult (permissible substitution): Payer: Medicare Other | Admitting: Cardiovascular Disease

## 2012-08-11 ENCOUNTER — Other Ambulatory Visit: Payer: Self-pay | Admitting: Internal Medicine

## 2012-08-13 ENCOUNTER — Encounter: Payer: Self-pay | Admitting: Internal Medicine

## 2012-08-13 ENCOUNTER — Ambulatory Visit (INDEPENDENT_AMBULATORY_CARE_PROVIDER_SITE_OTHER): Payer: Medicare Other | Admitting: Internal Medicine

## 2012-08-13 VITALS — BP 110/70 | HR 58 | Temp 97.8°F | Resp 16 | Wt 197.0 lb

## 2012-08-13 DIAGNOSIS — J209 Acute bronchitis, unspecified: Secondary | ICD-10-CM | POA: Diagnosis not present

## 2012-08-13 DIAGNOSIS — J45909 Unspecified asthma, uncomplicated: Secondary | ICD-10-CM | POA: Insufficient documentation

## 2012-08-13 MED ORDER — HYDROCODONE-HOMATROPINE 5-1.5 MG/5ML PO SYRP: 5.0000 mL | ORAL_SOLUTION | Freq: Three times a day (TID) | ORAL | Status: DC | PRN

## 2012-08-13 MED ORDER — MOXIFLOXACIN HCL 400 MG PO TABS: 400.0000 mg | ORAL_TABLET | Freq: Every day | ORAL | Status: DC

## 2012-08-13 NOTE — Patient Instructions (Signed)

## 2012-08-13 NOTE — Progress Notes (Signed)
Subjective:    Patient ID: Keith [name], male    DOB: 11-Mar-1940, 72 y.o.   MRN: [id number]  Cough This is a new problem. The current episode started 1 to 4 weeks ago. The problem has been gradually worsening. The problem occurs every few hours. The cough is productive of purulent sputum. Associated symptoms include chills and a sore throat. Pertinent negatives include no chest pain, ear congestion, ear pain, fever, headaches, heartburn, hemoptysis, myalgias, nasal congestion, postnasal drip, rash, rhinorrhea, shortness of breath, sweats, weight loss or wheezing. Nothing aggravates the symptoms. He has tried nothing for the symptoms. The treatment provided mild relief.      Review of Systems  Constitutional: Positive for chills. Negative for fever, weight loss, diaphoresis, activity change, appetite change and fatigue.  HENT: Positive for sore throat. Negative for ear pain, rhinorrhea and postnasal drip.   Eyes: Negative.   Respiratory: Positive for cough. Negative for apnea, hemoptysis, choking, chest tightness, shortness of breath, wheezing and stridor.   Cardiovascular: Negative.  Negative for chest pain, palpitations and leg swelling.  Gastrointestinal: Negative for heartburn, nausea, vomiting, abdominal pain, diarrhea and constipation.  Endocrine: Negative.   Genitourinary: Negative.   Musculoskeletal: Negative.  Negative for myalgias.  Skin: Negative.  Negative for rash.  Allergic/Immunologic: Negative.   Neurological: Negative.  Negative for headaches.  Hematological: Negative.  Negative for adenopathy. Does not bruise/bleed easily.  Psychiatric/Behavioral: Negative.        Objective:   Physical Exam  Vitals reviewed. Constitutional: He is oriented to person, place, and time. He appears well-developed and well-nourished.  Non-toxic appearance. He does not have a sickly appearance. He does not appear ill. No distress.  HENT:  Head: Normocephalic and atraumatic.  Mouth/Throat:  Oropharynx is clear and moist. No oropharyngeal exudate.  Eyes: Conjunctivae are normal. Right eye exhibits no discharge. Left eye exhibits no discharge. No scleral icterus.  Neck: Normal range of motion. Neck supple. No JVD present. No tracheal deviation present. No thyromegaly present.  Cardiovascular: Normal rate, regular rhythm, normal heart sounds and intact distal pulses.  Exam reveals no gallop and no friction rub.   No murmur heard. Pulmonary/Chest: Effort normal and breath sounds normal. No stridor. No respiratory distress. He has no wheezes. He has no rales. He exhibits no tenderness.  Abdominal: Soft. Bowel sounds are normal. He exhibits no distension and no mass. There is no tenderness. There is no rebound and no guarding.  Musculoskeletal: Normal range of motion. He exhibits no edema and no tenderness.  Lymphadenopathy:    He has no cervical adenopathy.  Neurological: He is oriented to person, place, and time.  Skin: Skin is warm and dry. No rash noted. He is not diaphoretic. No erythema. No pallor.  Psychiatric: He has a normal mood and affect. His behavior is normal. Judgment and thought content normal.     Lab Results  Component Value Date   WBC 9.7 04/25/2012   HGB 15.5 04/25/2012   HCT 45.5 04/25/2012   PLT 218.0 04/25/2012   GLUCOSE 95 04/25/2012   CHOL 172 04/25/2012   TRIG 200.0* 04/25/2012   HDL 29.50* 04/25/2012   LDLCALC 103* 04/25/2012   ALT 24 04/25/2012   AST 25 04/25/2012   NA 139 04/25/2012   K 4.4 04/25/2012   CL 105 04/25/2012   CREATININE 1.0 04/25/2012   BUN 14 04/25/2012   CO2 28 04/25/2012   TSH 0.66 04/25/2012   PSA 3.85 12/15/2011       Assessment &  Plan:

## 2012-08-14 ENCOUNTER — Encounter: Payer: Self-pay | Admitting: Internal Medicine

## 2012-08-14 NOTE — Assessment & Plan Note (Signed)
I will treat the infection with avelox and will control the cough with a suppressant

## 2012-08-17 ENCOUNTER — Telehealth: Payer: Self-pay | Admitting: Internal Medicine

## 2012-08-17 NOTE — Telephone Encounter (Signed)
Pt was seen Monday.  The cough is gone.  Still has the congestion.  No fever.  Has one antibiotic left.  Should he get another round of antibiotics? Pharmacy- [facility] [address]

## 2012-08-17 NOTE — Telephone Encounter (Signed)
Pt is aware.  

## 2012-08-17 NOTE — Telephone Encounter (Signed)
no

## 2012-08-20 ENCOUNTER — Ambulatory Visit (INDEPENDENT_AMBULATORY_CARE_PROVIDER_SITE_OTHER): Payer: Medicare Other | Admitting: Internal Medicine

## 2012-08-20 ENCOUNTER — Ambulatory Visit (INDEPENDENT_AMBULATORY_CARE_PROVIDER_SITE_OTHER)
Admission: RE | Admit: 2012-08-20 | Discharge: 2012-08-20 | Disposition: A | Discharge status: Home / Self Care | Payer: Medicare Other | Source: Ambulatory Visit | Attending: Internal Medicine | Admitting: Internal Medicine

## 2012-08-20 ENCOUNTER — Encounter: Payer: Self-pay | Admitting: Internal Medicine

## 2012-08-20 VITALS — BP 110/70 | HR 68 | Temp 97.8°F | Resp 16 | Wt 197.0 lb

## 2012-08-20 DIAGNOSIS — J209 Acute bronchitis, unspecified: Secondary | ICD-10-CM

## 2012-08-20 DIAGNOSIS — R05 Cough: Secondary | ICD-10-CM

## 2012-08-20 DIAGNOSIS — R0989 Other specified symptoms and signs involving the circulatory and respiratory systems: Secondary | ICD-10-CM | POA: Diagnosis not present

## 2012-08-20 MED ORDER — MOXIFLOXACIN HCL 400 MG PO TABS: 400.0000 mg | ORAL_TABLET | Freq: Every day | ORAL | Status: DC

## 2012-08-20 NOTE — Patient Instructions (Signed)

## 2012-08-21 NOTE — Progress Notes (Signed)
  Subjective:    Patient ID: Keith [name], male    DOB: 09/09/1940, 72 y.o.   MRN: [id number]  Cough This is a recurrent problem. The current episode started 1 to 4 weeks ago. The problem has been unchanged. The problem occurs every few hours. The cough is productive of purulent sputum. Pertinent negatives include no chest pain, chills, ear congestion, ear pain, fever, headaches, heartburn, hemoptysis, myalgias, nasal congestion, postnasal drip, rash, rhinorrhea, sore throat, shortness of breath, sweats, weight loss or wheezing. Nothing aggravates the symptoms. He has tried prescription cough suppressant for the symptoms. The treatment provided moderate relief. His past medical history is significant for bronchitis. There is no history of asthma, COPD, emphysema, environmental allergies or pneumonia.      Review of Systems  Constitutional: Negative.  Negative for fever, chills, weight loss, diaphoresis, activity change, appetite change, fatigue and unexpected weight change.  HENT: Negative.  Negative for ear pain, sore throat, rhinorrhea and postnasal drip.   Eyes: Negative.   Respiratory: Positive for cough. Negative for apnea, hemoptysis, choking, chest tightness, shortness of breath, wheezing and stridor.   Cardiovascular: Negative.  Negative for chest pain, palpitations and leg swelling.  Gastrointestinal: Negative.  Negative for heartburn, nausea, vomiting, abdominal pain, diarrhea, constipation and blood in stool.  Endocrine: Negative.   Genitourinary: Negative.   Musculoskeletal: Negative.  Negative for myalgias.  Skin: Negative.  Negative for rash.  Allergic/Immunologic: Negative.  Negative for environmental allergies.  Neurological: Negative.  Negative for dizziness and headaches.  Hematological: Negative.  Negative for adenopathy. Does not bruise/bleed easily.  Psychiatric/Behavioral: Negative.        Objective:   Physical Exam  Vitals reviewed. Constitutional: He is oriented  to person, place, and time. He appears well-developed and well-nourished. No distress.  HENT:  Head: Normocephalic and atraumatic.  Mouth/Throat: Oropharynx is clear and moist. No oropharyngeal exudate.  Eyes: Conjunctivae are normal. Right eye exhibits no discharge. Left eye exhibits no discharge. No scleral icterus.  Neck: Normal range of motion. Neck supple. No JVD present. No tracheal deviation present. No thyromegaly present.  Cardiovascular: Normal rate, regular rhythm, normal heart sounds and intact distal pulses.  Exam reveals no gallop and no friction rub.   No murmur heard. Pulmonary/Chest: Effort normal and breath sounds normal. No stridor. No respiratory distress. He has no wheezes. He has no rales. He exhibits no tenderness.  Abdominal: Soft. Bowel sounds are normal. He exhibits no distension and no mass. There is no tenderness. There is no rebound and no guarding.  Musculoskeletal: Normal range of motion. He exhibits no edema and no tenderness.  Lymphadenopathy:    He has no cervical adenopathy.  Neurological: He is oriented to person, place, and time.  Skin: Skin is warm and dry. No rash noted. He is not diaphoretic. No erythema. No pallor.  Psychiatric: He has a normal mood and affect. His behavior is normal. Judgment and thought content normal.          Assessment & Plan:

## 2012-08-21 NOTE — Assessment & Plan Note (Signed)
I will check his CXR to see if there is pna, mass, edema

## 2012-08-21 NOTE — Assessment & Plan Note (Signed)
Will extend the Avelox for another 5 days to see if the infection will resolve

## 2012-08-30 ENCOUNTER — Ambulatory Visit: Payer: Medicare Other | Admitting: Internal Medicine

## 2012-08-31 ENCOUNTER — Encounter: Payer: Self-pay | Admitting: Internal Medicine

## 2012-08-31 ENCOUNTER — Ambulatory Visit (INDEPENDENT_AMBULATORY_CARE_PROVIDER_SITE_OTHER): Payer: Medicare Other | Admitting: Internal Medicine

## 2012-08-31 VITALS — BP 110/72 | HR 66 | Temp 98.2°F | Wt 195.0 lb

## 2012-08-31 DIAGNOSIS — J209 Acute bronchitis, unspecified: Secondary | ICD-10-CM

## 2012-08-31 MED ORDER — ALBUTEROL SULFATE HFA 108 (90 BASE) MCG/ACT IN AERS: 2.0000 | INHALATION_SPRAY | Freq: Four times a day (QID) | RESPIRATORY_TRACT | Status: DC | PRN

## 2012-08-31 MED ORDER — AZITHROMYCIN 250 MG PO TABS: ORAL_TABLET | ORAL | Status: DC

## 2012-08-31 MED ORDER — PREDNISONE (PAK) 10 MG PO TABS: 10.0000 mg | ORAL_TABLET | ORAL | Status: DC

## 2012-08-31 NOTE — Progress Notes (Signed)
Subjective:    Patient ID: Keith [name], male    DOB: [birth year], 72 y.o.   MRN: [id number]  HPI   Patient presents today with a 4 week history of cough currently productive of white sputum and wheeze. He has been seen by Dr. Yetta Barre on 08/13/12 and 08/20/12 for these symptoms. At the first visit he was prescribed moxifloxacin x 5 days and hycodan prn for cough. At the second visit, his symptoms had not improved and the moxifloxacin was continued for another 5 day course. A CXR was done at his latest visit which revealed no pneumonia or other cardiopulmonary abnormality. Today, he reports that he has had little improvement in his productive cough, which is worst when he wakes up in the morning. Along with the cough, the patient has experienced malaise and fatigue with some sinus pressure. He denies SOB, chest tightness, fever and chills, rhinorrhea, sore throat, ear pain, and ill contacts.   Past Medical History  Diagnosis Date  . Allergic rhinitis   . Hyperlipidemia   . DDD (degenerative disc disease), cervical   . Hypothyroidism    Family History  Problem Relation Age of Onset  . Coronary artery disease Father     Fatal MI  . Coronary artery disease Other   . Breast cancer Other   . Diabetes Other     History  Substance Use Topics  . Smoking status: Never Smoker   . Smokeless tobacco: Never Used  . Alcohol Use: No    Review of Systems  Gastrointestinal: Negative for nausea, vomiting, abdominal pain, diarrhea and constipation.  Skin: Negative for rash.  Neurological: Negative for dizziness and weakness.  ROS is otherwise negative or as stated in HPI.      Objective:   Physical Exam  Constitutional: He is oriented to person, place, and time. He appears well-developed and well-nourished.  HENT:  Right Ear: External ear normal.  Left Ear: External ear normal.  TM's clear bilaterally  Neck: Normal range of motion. Neck supple. No thyromegaly present.  Cardiovascular: Normal  rate and regular rhythm.   Pulmonary/Chest: No respiratory distress. He has wheezes. He has no rales.  Occasional expiratory wheeze noted at right lung base  Lymphadenopathy:    He has no cervical adenopathy.  Neurological: He is alert and oriented to person, place, and time.  Skin: Skin is warm and dry.  Psychiatric: He has a normal mood and affect.   Lab Results  Component Value Date   WBC 9.7 04/25/2012   HGB 15.5 04/25/2012   HCT 45.5 04/25/2012   PLT 218.0 04/25/2012   GLUCOSE 95 04/25/2012   CHOL 172 04/25/2012   TRIG 200.0* 04/25/2012   HDL 29.50* 04/25/2012   LDLCALC 103* 04/25/2012   ALT 24 04/25/2012   AST 25 04/25/2012   NA 139 04/25/2012   K 4.4 04/25/2012   CL 105 04/25/2012   CREATININE 1.0 04/25/2012   BUN 14 04/25/2012   CO2 28 04/25/2012   TSH 0.66 04/25/2012   PSA 3.85 12/15/2011   CXR 08/20/12: No cardiopulmonary abnormality     Assessment & Plan:  1. Bronchitis, element of bronchospasm contributing to cough- Ordered 6 day prednisone taper to relieve inflammation of airways. Ordered Zithromax to treat infection causing the bronchitis. Zithromax may provide alternative coverage of pathogens causing the bronchitis compared to the moxifloxacin. Albuterol was ordered for prn use for cough and wheeze. Patient is to continue hycodan prn nightly for cough. He should follow up if symptoms worsen  or do not improve over the next 1-2 weeks.   Concha Se, [place] [facility]ighbor  I have personally reviewed this case with PA student. I also personally collected history and examined this patient. I agree with history and findings as documented above. I reviewed, discussed and approve of the assessment and plan as listed above. Rene Paci, MD

## 2012-08-31 NOTE — Patient Instructions (Signed)
It was good to see you today. We have reviewed your prior records including labs and tests today Medications reviewed and updated, Continue current cough syrup at night for cough suppression  Begin prednisone taper over next 6 days, Z-Pak antibiotic for 5 days and albuterol inhaler as needed for cough/wheeze

## 2012-09-10 ENCOUNTER — Telehealth: Payer: Self-pay | Admitting: *Deleted

## 2012-09-10 DIAGNOSIS — J4521 Mild intermittent asthma with (acute) exacerbation: Secondary | ICD-10-CM

## 2012-09-10 MED ORDER — BUDESONIDE-FORMOTEROL FUMARATE 160-4.5 MCG/ACT IN AERO: 2.0000 | INHALATION_SPRAY | Freq: Two times a day (BID) | RESPIRATORY_TRACT | Status: DC

## 2012-09-10 MED ORDER — METHYLPREDNISOLONE 4 MG PO KIT: PACK | ORAL | Status: DC

## 2012-09-10 NOTE — Telephone Encounter (Signed)
Spoke with pt.  Advised Rx has been sent to pharmacy

## 2012-09-10 NOTE — Telephone Encounter (Signed)
Start medrol dose pak and symbicort

## 2012-09-10 NOTE — Telephone Encounter (Signed)
Pt called states he has been seen previously for Bronchitis.  Still wheezing and congested.  Still has productive cough.  Please advise.

## 2012-09-20 ENCOUNTER — Ambulatory Visit: Payer: Medicare Other | Admitting: Internal Medicine

## 2012-10-23 ENCOUNTER — Encounter: Payer: Self-pay | Admitting: Internal Medicine

## 2012-10-23 ENCOUNTER — Ambulatory Visit (INDEPENDENT_AMBULATORY_CARE_PROVIDER_SITE_OTHER)
Admission: RE | Admit: 2012-10-23 | Discharge: 2012-10-23 | Disposition: A | Discharge status: Home / Self Care | Payer: Medicare Other | Source: Ambulatory Visit | Attending: Internal Medicine | Admitting: Internal Medicine

## 2012-10-23 ENCOUNTER — Ambulatory Visit (INDEPENDENT_AMBULATORY_CARE_PROVIDER_SITE_OTHER): Payer: Medicare Other | Admitting: Internal Medicine

## 2012-10-23 VITALS — BP 112/72 | HR 70 | Temp 97.8°F | Resp 16 | Wt 199.0 lb

## 2012-10-23 DIAGNOSIS — S8990XA Unspecified injury of unspecified lower leg, initial encounter: Secondary | ICD-10-CM | POA: Diagnosis not present

## 2012-10-23 DIAGNOSIS — S79922A Unspecified injury of left thigh, initial encounter: Secondary | ICD-10-CM

## 2012-10-23 DIAGNOSIS — Z23 Encounter for immunization: Secondary | ICD-10-CM | POA: Diagnosis not present

## 2012-10-23 DIAGNOSIS — S79929A Unspecified injury of unspecified thigh, initial encounter: Secondary | ICD-10-CM | POA: Insufficient documentation

## 2012-10-23 DIAGNOSIS — S79919A Unspecified injury of unspecified hip, initial encounter: Secondary | ICD-10-CM | POA: Diagnosis not present

## 2012-10-23 MED ORDER — IBUPROFEN 600 MG PO TABS: 600.0000 mg | ORAL_TABLET | Freq: Three times a day (TID) | ORAL | Status: DC | PRN

## 2012-10-23 NOTE — Assessment & Plan Note (Signed)
The plain film is normal I will treat as MS strain with nsaids He asked for steroids but I declined - he has had multiple courses of steroid this year and I am concerned that this has become a problem for him

## 2012-10-23 NOTE — Progress Notes (Signed)
Subjective:    Patient ID: Keith [name], male    DOB: [birth year], 72 y.o.   MRN: [id number]  HPI  He twisted his left thigh 6 weeks ago and has persistent/diffuse pain from the hip down the front of the leg.  Review of Systems  Constitutional: Negative.   HENT: Negative.   Eyes: Negative.   Respiratory: Negative.  Negative for cough, chest tightness, shortness of breath, wheezing and stridor.   Cardiovascular: Negative.  Negative for chest pain, palpitations and leg swelling.  Gastrointestinal: Negative.  Negative for nausea, abdominal pain, diarrhea and constipation.  Endocrine: Negative.   Genitourinary: Negative.   Musculoskeletal: Positive for arthralgias. Negative for myalgias, back pain, joint swelling and gait problem.  Skin: Negative.   Allergic/Immunologic: Negative.   Neurological: Negative.  Negative for dizziness, syncope, facial asymmetry, speech difficulty, weakness, light-headedness and numbness.  Hematological: Negative.  Negative for adenopathy. Does not bruise/bleed easily.  Psychiatric/Behavioral: Negative.        Objective:   Physical Exam  Vitals reviewed. Constitutional: He is oriented to person, place, and time. He appears well-developed and well-nourished. No distress.  HENT:  Head: Normocephalic and atraumatic.  Mouth/Throat: Oropharynx is clear and moist. No oropharyngeal exudate.  Eyes: Conjunctivae are normal. Right eye exhibits no discharge. Left eye exhibits no discharge. No scleral icterus.  Neck: Normal range of motion. Neck supple. No JVD present. No tracheal deviation present. No thyromegaly present.  Cardiovascular: Normal rate, regular rhythm, normal heart sounds and intact distal pulses.  Exam reveals no gallop and no friction rub.   No murmur heard. Pulmonary/Chest: Effort normal and breath sounds normal. No stridor. No respiratory distress. He has no wheezes. He has no rales. He exhibits no tenderness.  Abdominal: Soft. Bowel sounds are  normal. He exhibits no distension and no mass. There is no tenderness. There is no rebound and no guarding.  Musculoskeletal: Normal range of motion. He exhibits no edema and no tenderness.       Left hip: Normal. He exhibits normal range of motion, normal strength, no tenderness, no bony tenderness, no swelling, no crepitus, no deformity and no laceration.       Left knee: Normal.       Left upper leg: Normal. He exhibits no tenderness, no bony tenderness, no swelling, no edema, no deformity and no laceration.  Lymphadenopathy:    He has no cervical adenopathy.  Neurological: He is alert and oriented to person, place, and time. He has normal reflexes. He displays no atrophy, no tremor and normal reflexes. No cranial nerve deficit or sensory deficit. He exhibits normal muscle tone. He displays no seizure activity. Coordination and gait normal.  Reflex Scores:      Tricep reflexes are 2+ on the right side and 2+ on the left side.      Bicep reflexes are 2+ on the right side and 2+ on the left side.      Brachioradialis reflexes are 2+ on the right side and 2+ on the left side.      Patellar reflexes are 2+ on the right side and 2+ on the left side.      Achilles reflexes are 2+ on the right side and 2+ on the left side. Skin: Skin is warm and dry. No rash noted. He is not diaphoretic. No erythema. No pallor.  Psychiatric: He has a normal mood and affect. His behavior is normal. Judgment and thought content normal.     Lab Results  Component Value Date  WBC 9.7 04/25/2012   HGB 15.5 04/25/2012   HCT 45.5 04/25/2012   PLT 218.0 04/25/2012   GLUCOSE 95 04/25/2012   CHOL 172 04/25/2012   TRIG 200.0* 04/25/2012   HDL 29.50* 04/25/2012   LDLCALC 103* 04/25/2012   ALT 24 04/25/2012   AST 25 04/25/2012   NA 139 04/25/2012   K 4.4 04/25/2012   CL 105 04/25/2012   CREATININE 1.0 04/25/2012   BUN 14 04/25/2012   CO2 28 04/25/2012   TSH 0.66 04/25/2012   PSA 3.85 12/15/2011       Assessment & Plan:

## 2012-10-23 NOTE — Patient Instructions (Signed)
Muscle Strain  Muscle strain occurs when a muscle is stretched beyond its normal length. A small number of muscle fibers generally are torn. This is especially common in athletes. This happens when a sudden, violent force placed on a muscle stretches it too far. Usually, recovery from muscle strain takes 1 to 2 weeks. Complete healing will take 5 to 6 weeks.   HOME CARE INSTRUCTIONS    While awake, apply ice to the sore muscle for the first 2 days after the injury.   Put ice in a plastic bag.   Place a towel between your skin and the bag.   Leave the ice on for 15-20 minutes each hour.   Do not use the strained muscle for several days, until you no longer have pain.   You may wrap the injured area with an elastic bandage for comfort. Be careful not to wrap it too tightly. This may interfere with blood circulation or increase swelling.   Only take over-the-counter or prescription medicines for pain, discomfort, or fever as directed by your caregiver.  SEEK MEDICAL CARE IF:   You have increasing pain or swelling in the injured area.  MAKE SURE YOU:    Understand these instructions.   Will watch your condition.   Will get help right away if you are not doing well or get worse.  Document Released: 01/24/2005 Document Revised: 04/18/2011 Document Reviewed: 02/05/2011  [employer] Patient Information 2014 [employer].

## 2012-10-26 ENCOUNTER — Other Ambulatory Visit: Payer: Self-pay | Admitting: *Deleted

## 2012-10-26 MED ORDER — LEVOTHYROXINE SODIUM 150 MCG PO TABS: ORAL_TABLET | ORAL | Status: DC

## 2012-11-05 ENCOUNTER — Encounter: Payer: Self-pay | Admitting: Family Medicine

## 2012-11-05 ENCOUNTER — Ambulatory Visit (INDEPENDENT_AMBULATORY_CARE_PROVIDER_SITE_OTHER): Payer: Medicare Other | Admitting: Family Medicine

## 2012-11-05 VITALS — BP 132/84 | HR 59 | Wt 197.0 lb

## 2012-11-05 DIAGNOSIS — IMO0002 Reserved for concepts with insufficient information to code with codable children: Secondary | ICD-10-CM

## 2012-11-05 DIAGNOSIS — S76112A Strain of left quadriceps muscle, fascia and tendon, initial encounter: Secondary | ICD-10-CM

## 2012-11-05 DIAGNOSIS — M169 Osteoarthritis of hip, unspecified: Secondary | ICD-10-CM

## 2012-11-05 DIAGNOSIS — S76119A Strain of unspecified quadriceps muscle, fascia and tendon, initial encounter: Secondary | ICD-10-CM | POA: Insufficient documentation

## 2012-11-05 MED ORDER — MELOXICAM 15 MG PO TABS: 15.0000 mg | ORAL_TABLET | Freq: Every day | ORAL | Status: DC

## 2012-11-05 MED ORDER — METHYLPREDNISOLONE ACETATE 80 MG/ML IJ SUSP
80.0000 mg | Freq: Once | INTRAMUSCULAR | Status: AC
  Administered 2012-11-05: 80 mg via INTRAMUSCULAR

## 2012-11-05 MED ORDER — KETOROLAC TROMETHAMINE 60 MG/2ML IM SOLN
60.0000 mg | Freq: Once | INTRAMUSCULAR | Status: AC
  Administered 2012-11-05: 60 mg via INTRAMUSCULAR

## 2012-11-05 NOTE — Progress Notes (Signed)
  I'm seeing this patient by the request  of:  Dr. Yetta Barre  CC: Left thigh pain  HPI: 72 yo male coming in with left thigh pain for one month duration. Patient was stung by a yellow jackets in the right ankle and when he tried to jump the way he unfortunately felt a strong pain in his hip. Patient did have some discoloration anteriorly initially. Patient states that the pain seemed to improve somewhat but still when he tries to push off such as going up stairs or down stairs he does still have pain he points to his groin area that does radiate down the front of his leg. Patient denies any back pain. Patient denies any weakness or numbness. Patient can states that at the end of a long day he can have some throbbing pain at night but this does not seem to wake him up. Patient was taking some ibuprofen which was not very helpful. Other than that he has not tried any other modalities. Patient was the pain severity of 6/10. Character the patient is a dull aching sensation that can have sharp pain with certain movements.  Past medical, surgical, family and social history reviewed. Medications reviewed all in the electronic medical record.  Review of Systems: No headache, visual changes, nausea, vomiting, diarrhea, constipation, dizziness, abdominal pain, skin rash, fevers, chills, night sweats, weight loss, swollen lymph nodes, body aches, joint swelling, muscle aches, chest pain, shortness of breath, mood changes.   Objective:    Blood pressure 132/84, pulse 59, weight 197 lb (89.359 kg), SpO2 96.00%.   General: No apparent distress alert and oriented x3 mood and affect normal, dressed appropriately.  HEENT: Pupils equal, extraocular movements intact Respiratory: Patient's speak in full sentences and does not appear short of breath Cardiovascular: No lower extremity edema, non tender, no erythema Skin: Warm dry intact with no signs of infection or rash on extremities or on axial skeleton. Abdomen: Soft  nontender Neuro: Cranial nerves II through XII are intact, neurovascularly intact in all extremities with 2+ DTRs and 2+ pulses. Lymph: No lymphadenopathy of posterior or anterior cervical chain or axillae bilaterally.  Gait normal with good balance and coordination.  MSK: Non tender with full range of motion and good stability and symmetric strength and tone of shoulders, elbows, wrist, knee and ankles bilaterally.  Hip: left ROM IR: 35 mild pain in groion Deg, ER: 45 Deg, Flexion: 100 Deg, Extension: 80 Deg, Abduction: 45 Deg, Adduction: 45 Deg Strength IR: 5/5, ER: 5/5, Flexion: 5/5, Extension: 5/5, Abduction: 5/5, Adduction: 5/5 Pelvic alignment unremarkable to inspection and palpation. Standing hip rotation and gait without trendelenburg sign / unsteadiness. Greater trochanter without tenderness to palpation. No tenderness over piriformis and greater trochanter. + FADIR No SI joint tenderness and normal minimal SI movement. Tender to palpation over the proximal quadricep tendon. Mostly just inferior AIIS.   X-rays were reviewed and interpreted by me today. Patient's femur was very unremarkable. Patient does have some mild osteoarthritic changes of the hip.  Impression and Recommendations:     This case required medical decision making of moderate complexity.

## 2012-11-05 NOTE — Assessment & Plan Note (Signed)
I think patient's likely has some underlying osteoarthritis of the left hip that is probably corresponding to his pain. Patient does have a positive FADIR test today. Like to try to treat him conservatively with range of motion exercises and focusing on potential quadriceps tendinosis. Patient didn't to is continuing to have pain in 23 weeks we will consider further imaging of his left hip.

## 2012-11-05 NOTE — Patient Instructions (Addendum)
Nice to [employer] have a thigh strain Try exercises daily Meloxicam daily for 10 days, stop it if it hurts your stomach.  Icing 20 minutes 2 times  Wear thigh compression sleeve. Vitamin D 1000 IU daily could help Fish oil 3 grams daily Come back and see me again in 2-3 weeks, if still pain will do ultrasound.

## 2012-11-05 NOTE — Assessment & Plan Note (Signed)
I think patient does have some mild quadriceps strain occurring. I cannot tell that if his pain is secondary to this order to exhaust arthritis of the left hip. Patient x-ray was nonweightbearing which makes it difficult to tell how much. Patient given home exercise program thigh compression sleeve Icing protocol Meloxicam x10 days Patient will followup again in 2-3 weeks. If he continues to have pain at that time I would like to do an ultrasound of his left hip and potentially an intra-articular hip injection. This could be diagnostic as well as therapeutic.

## 2012-11-20 ENCOUNTER — Ambulatory Visit: Payer: Medicare Other | Admitting: Family Medicine

## 2012-11-27 ENCOUNTER — Ambulatory Visit (INDEPENDENT_AMBULATORY_CARE_PROVIDER_SITE_OTHER): Payer: Medicare Other | Admitting: Family Medicine

## 2012-11-27 ENCOUNTER — Other Ambulatory Visit (INDEPENDENT_AMBULATORY_CARE_PROVIDER_SITE_OTHER): Payer: Medicare Other

## 2012-11-27 ENCOUNTER — Encounter: Payer: Self-pay | Admitting: Family Medicine

## 2012-11-27 VITALS — BP 140/86 | HR 59 | Wt 198.0 lb

## 2012-11-27 DIAGNOSIS — IMO0002 Reserved for concepts with insufficient information to code with codable children: Secondary | ICD-10-CM

## 2012-11-27 DIAGNOSIS — S76112A Strain of left quadriceps muscle, fascia and tendon, initial encounter: Secondary | ICD-10-CM

## 2012-11-27 DIAGNOSIS — M169 Osteoarthritis of hip, unspecified: Secondary | ICD-10-CM

## 2012-11-27 NOTE — Progress Notes (Signed)
CC: Left thigh pain follow up  HPI: 72 yo male coming in with left thigh pain for one month duration. Patient was seen previously diagnosed with the quadricep strain and was given home exercises as well as anti-inflammatories. Patient feels that he has not made any improvement. Patient continues to walk but he continues to have a throbbing sensation after activity or sitting for long amount of time. In addition to that patient is having more of the radiation down the groin area. Still states that the pain is approximately 6/10.  Past medical, surgical, family and social history reviewed. Medications reviewed all in the electronic medical record.  Review of Systems: No headache, visual changes, nausea, vomiting, diarrhea, constipation, dizziness, abdominal pain, skin rash, fevers, chills, night sweats, weight loss, swollen lymph nodes, body aches, joint swelling, muscle aches, chest pain, shortness of breath, mood changes.   Objective:    Blood pressure 140/86, pulse 59, weight 198 lb (89.812 kg), SpO2 97.00%.   General: No apparent distress alert and oriented x3 mood and affect normal, dressed appropriately.  HEENT: Pupils equal, extraocular movements intact Respiratory: Patient's speak in full sentences and does not appear short of breath Cardiovascular: No lower extremity edema, non tender, no erythema Skin: Warm dry intact with no signs of infection or rash on extremities or on axial skeleton. Abdomen: Soft nontender Neuro: Cranial nerves II through XII are intact, neurovascularly intact in all extremities with 2+ DTRs and 2+ pulses. Lymph: No lymphadenopathy of posterior or anterior cervical chain or axillae bilaterally.  Gait normal with good balance and coordination.  MSK: Non tender with full range of motion and good stability and symmetric strength and tone of shoulders, elbows, wrist, knee and ankles bilaterally.  Hip: left ROM IR: 35 mild pain in groion Deg, ER: 45 Deg, Flexion:  100 Deg, Extension: 80 Deg, Abduction: 45 Deg, Adduction: 45 Deg Strength IR: 5/5, ER: 5/5, Flexion: 5/5, Extension: 5/5, Abduction: 5/5, Adduction: 5/5 Pelvic alignment unremarkable to inspection and palpation. Standing hip rotation and gait without trendelenburg sign / unsteadiness. Greater trochanter without tenderness to palpation. No tenderness over piriformis and greater trochanter. + FADIR No SI joint tenderness and normal minimal SI movement. Tender to palpation over the proximal quadricep tendon. Mostly just inferior AIIS.   X-rays were reviewed and interpreted by me today. Patient's femur was very unremarkable. Patient does have some mild osteoarthritic changes of the hip.  Muscular skeletal ultrasound was performed and interpreted by Antoine Primas, M today. Ultrasound of the left hip  Ultrasound shows that patient actually does not have any significant osteoarthritic changes of the hip but does have a small hip effusion. Pictures were taken. In addition this patient at the insertion of the quadricep tendon on the AIis show some mild hypoechoic changes but note tear appreciated.  Impression: Mild hip arthritis with small effusion  Procedure: Real-time Ultrasound Guided Injection of left intra-articular hip Device: GE Logiq E  Ultrasound guided injection is preferred based studies that show increased duration, increased effect, greater accuracy, decreased procedural pain, increased response rate with ultrasound guided versus blind injection.  Verbal informed consent obtained.  Time-out conducted.  Noted no overlying erythema, induration, or other signs of local infection.  Skin prepped in a sterile fashion.  Local anesthesia: Topical Ethyl chloride.  With sterile technique and under real time ultrasound guidance:  Anterior capsule visualized, needle visualized going to the head neck junction at the anterior capsule. Pictures taken. Patient did have injection of 3 cc of 1%  lidocaine, 3 cc of 0.5% Marcaine, and 1 cc of Kenalog 40 mg/dL. Completed without difficulty  Pain immediately resolved suggesting accurate placement of the medication.  Advised to call if fevers/chills, erythema, induration, drainage, or persistent bleeding.  Images permanently stored and available for review in the ultrasound unit.  Impression: Technically successful ultrasound guided injection.      Impression and Recommendations:     This case required medical decision making of moderate complexity.

## 2012-11-27 NOTE — Assessment & Plan Note (Signed)
Patient had an intra-articular steroid injection of the hip today. Patient tolerated the procedure very well. Patient did have significant decrease in pain immediately. Patient will continue home exercise program and continue walking discussing night walking on softer surfaces could be more beneficial. Encouraged core strengthening exercises. Patient will try these in his unfortunately he continues to have pain after this injection we will consider doing further imaging studies which would likely include an MRI of the back as well as  MRA of the left hip to rule out labral tear. Patient does have history of degenerative changes of the lumbar spine

## 2012-11-27 NOTE — Patient Instructions (Signed)
We did an injection in the help today and hopefully this will give you relief. Continue the exercises. Continue walking try if you can on a track.  Come back again in 3 weeks.

## 2012-12-25 ENCOUNTER — Encounter: Payer: Self-pay | Admitting: Family Medicine

## 2012-12-25 ENCOUNTER — Ambulatory Visit (INDEPENDENT_AMBULATORY_CARE_PROVIDER_SITE_OTHER): Payer: Medicare Other | Admitting: Family Medicine

## 2012-12-25 VITALS — BP 140/86 | HR 72

## 2012-12-25 DIAGNOSIS — Z5189 Encounter for other specified aftercare: Secondary | ICD-10-CM | POA: Diagnosis not present

## 2012-12-25 DIAGNOSIS — M169 Osteoarthritis of hip, unspecified: Secondary | ICD-10-CM | POA: Diagnosis not present

## 2012-12-25 DIAGNOSIS — S79922D Unspecified injury of left thigh, subsequent encounter: Secondary | ICD-10-CM

## 2012-12-25 MED ORDER — GABAPENTIN 100 MG PO CAPS: 100.0000 mg | ORAL_CAPSULE | Freq: Every day | ORAL | Status: DC

## 2012-12-25 NOTE — Assessment & Plan Note (Signed)
I do think patient's thigh pain is likely still radiation of pain from his hip. Patient is doing very well and we can continue to do injections every 3-4 months of this left interarticular hip. Patient is very active and wants to remain active. Patient will continue on the other home modalities and given some do exercises today. We'll follow up again in 2 months.

## 2012-12-25 NOTE — Assessment & Plan Note (Signed)
With this thigh pain I do think it is more of a radiculopathy secondary to the hip joint. Patient is going to be started on Neurontin 100 mg at night. I do not plan on titrating higher than 300 mg at night. Patient warned of potential side effects. Patient will followup in 2 weeks' time.

## 2012-12-25 NOTE — Patient Instructions (Signed)
Great to see you Try neurontin at night, may make you sleepy.  Continue exercises most days of the week.  Continue vitamin D, tylenol, aleve, fish oil, etc.  Come back in 2 months and we can do another injection if needed.

## 2012-12-25 NOTE — Progress Notes (Signed)
Pre-visit discussion using our clinic review tool. No additional management support is needed unless otherwise documented below in the visit note.  

## 2012-12-25 NOTE — Progress Notes (Signed)
  CC: Left hip and thigh pain followup  HPI: 72 yo male coming in for follow up of left thigh as well as hip pain. Patient was seen previously and was given an intra-articular hip injection. Patient states that this did respond very well and resolved approximately 95% of the pain. Patient continues to do the exercises on a fairly regular basis, continue the vitamin D as well as fish oil. Patient is still having some mild discomfort of the anterior thigh. This seems to be only significant at night swelling down. Patient denies that it is waking him up but has more of a burning sensation from time to time. Denies any weakness. Overall he is happy with the results but would like to see if we can get rid of this burning pain as well.  Past medical, surgical, family and social history reviewed. Medications reviewed all in the electronic medical record.  Review of Systems: No headache, visual changes, nausea, vomiting, diarrhea, constipation, dizziness, abdominal pain, skin rash, fevers, chills, night sweats, weight loss, swollen lymph nodes, body aches, joint swelling, muscle aches, chest pain, shortness of breath, mood changes.   Objective:    Blood pressure 140/86, pulse 72, SpO2 97.00%.   General: No apparent distress alert and oriented x3 mood and affect normal, dressed appropriately.  HEENT: Pupils equal, extraocular movements intact Respiratory: Patient's speak in full sentences and does not appear short of breath Cardiovascular: No lower extremity edema, non tender, no erythema Skin: Warm dry intact with no signs of infection or rash on extremities or on axial skeleton. Abdomen: Soft nontender Neuro: Cranial nerves II through XII are intact, neurovascularly intact in all extremities with 2+ DTRs and 2+ pulses. Lymph: No lymphadenopathy of posterior or anterior cervical chain or axillae bilaterally.  Gait normal with good balance and coordination.  MSK: Non tender with full range of motion and  good stability and symmetric strength and tone of shoulders, elbows, wrist, knee and ankles bilaterally.  Hip: left ROM IR: 35 palpated groin the contralateral hip has 35 as well the Deg, ER: 45 Deg, Flexion: 100 Deg, Extension: 80 Deg, Abduction: 45 Deg, Adduction: 45 Deg Strength IR: 5/5, ER: 5/5, Flexion: 5/5, Extension: 5/5, Abduction: 5/5, Adduction: 5/5 Pelvic alignment unremarkable to inspection and palpation. Standing hip rotation and gait without trendelenburg sign / unsteadiness. Greater trochanter without tenderness to palpation. No tenderness over piriformis and greater trochanter. + FADIR but significantly less painful No SI joint tenderness and normal minimal SI movement. Patient still has mild tenderness over the proximal quadricep but very minimal compared to last visit   Impression and Recommendations:     This case required medical decision making of moderate complexity.

## 2013-02-21 ENCOUNTER — Ambulatory Visit: Payer: Medicare Other | Admitting: Family Medicine

## 2013-02-25 ENCOUNTER — Encounter: Payer: Self-pay | Admitting: Family Medicine

## 2013-02-25 ENCOUNTER — Other Ambulatory Visit (INDEPENDENT_AMBULATORY_CARE_PROVIDER_SITE_OTHER): Payer: Medicare Other

## 2013-02-25 ENCOUNTER — Ambulatory Visit (INDEPENDENT_AMBULATORY_CARE_PROVIDER_SITE_OTHER): Payer: Medicare Other | Admitting: Family Medicine

## 2013-02-25 VITALS — BP 128/70 | HR 67 | Temp 98.1°F | Resp 16 | Wt 195.0 lb

## 2013-02-25 DIAGNOSIS — M25559 Pain in unspecified hip: Secondary | ICD-10-CM

## 2013-02-25 DIAGNOSIS — M25552 Pain in left hip: Secondary | ICD-10-CM

## 2013-02-25 NOTE — Assessment & Plan Note (Signed)
Discussed with patient at length again. Spent greater than 25 minutes with patient face-to-face and had greater than 50% of counseling including as described above in assessment and plan. Patient was given home exercise program to do a regular basis. Discussed over-the-counter medications including Tylenol I can be helpful. I did encourage patient to have x-rays which he declined today the Patient did respond well to the injection. Patient will followup in 4 weeks to make sure he continues to do well.

## 2013-02-25 NOTE — Progress Notes (Signed)
Pre-visit discussion using our clinic review tool. No additional management support is needed unless otherwise documented below in the visit note.  

## 2013-02-25 NOTE — Progress Notes (Signed)
CC: Left hip and thigh pain followup  HPI: 73 yo male coming in for follow up of hip pain. Patient was seen previously and has been diagnosed with degenerative disease on ultrasound. Patient did have an injection intra-articular of the hip 3 months ago that did nearly completely resolved patient's pain. Patient states that it has slowly come back but he still 50% better than he was initially. Patient would like to try another injection if possible. Patient has been taking vitamin D which has been helpful. Patient has become less stringent on the exercises.  Past medical, surgical, family and social history reviewed. Medications reviewed all in the electronic medical record.  Review of Systems: No headache, visual changes, nausea, vomiting, diarrhea, constipation, dizziness, abdominal pain, skin rash, fevers, chills, night sweats, weight loss, swollen lymph nodes, body aches, joint swelling, muscle aches, chest pain, shortness of breath, mood changes.   Objective:    Blood pressure 128/70, pulse 67, temperature 98.1 F (36.7 C), temperature source Oral, resp. rate 16, weight 195 lb 0.6 oz (88.47 kg), SpO2 97.00%.   General: No apparent distress alert and oriented x3 mood and affect normal, dressed appropriately.  HEENT: Pupils equal, extraocular movements intact Respiratory: Patient's speak in full sentences and does not appear short of breath Cardiovascular: No lower extremity edema, non tender, no erythema Skin: Warm dry intact with no signs of infection or rash on extremities or on axial skeleton. Abdomen: Soft nontender Neuro: Cranial nerves II through XII are intact, neurovascularly intact in all extremities with 2+ DTRs and 2+ pulses. Lymph: No lymphadenopathy of posterior or anterior cervical chain or axillae bilaterally.  Gait normal with good balance and coordination.  MSK: Non tender with full range of motion and good stability and symmetric strength and tone of shoulders, elbows,  wrist, knee and ankles bilaterally.  Hip: left ROM IR: 25 palpated groin the contralateral hip has 35 as well the Deg, ER: 45 Deg, Flexion: 100 Deg, Extension: 80 Deg, Abduction: 45 Deg, Adduction: 45 Deg Strength IR: 5/5, ER: 5/5, Flexion: 5/5, Extension: 5/5, Abduction: 5/5, Adduction: 5/5 Pelvic alignment unremarkable to inspection and palpation. Standing hip rotation and gait without trendelenburg sign / unsteadiness. Greater trochanter without tenderness to palpation. No tenderness over piriformis and greater trochanter. + FADIR  No SI joint tenderness and normal minimal SI movement. Patient still has mild tenderness over the proximal quadricep but very minimal compared to last visit   Procedure: Real-time Ultrasound Guided Injection of left intra-articular hip Device: GE Logiq E  Ultrasound guided injection is preferred based studies that show increased duration, increased effect, greater accuracy, decreased procedural pain, increased response rate with ultrasound guided versus blind injection.  Verbal informed consent obtained.  Time-out conducted.  Noted no overlying erythema, induration, or other signs of local infection.  Skin prepped in a sterile fashion.  Local anesthesia: Topical Ethyl chloride.  With sterile technique and under real time ultrasound guidance:  Anterior capsule visualized, needle visualized going to the head neck junction at the anterior capsule. Pictures taken. Patient did have injection of 3 cc of 1% lidocaine, 3 cc of 0.5% Marcaine, and 1 cc of Kenalog 40 mg/dL. Completed without difficulty  Pain immediately resolved suggesting accurate placement of the medication.  Advised to call if fevers/chills, erythema, induration, drainage, or persistent bleeding.  Images permanently stored and available for review in the ultrasound unit.  Impression: Technically successful ultrasound guided injection.    Impression and Recommendations:     This case required  medical decision making of moderate complexity.

## 2013-03-20 ENCOUNTER — Ambulatory Visit (INDEPENDENT_AMBULATORY_CARE_PROVIDER_SITE_OTHER): Payer: Medicare Other | Admitting: Internal Medicine

## 2013-03-20 ENCOUNTER — Encounter: Payer: Self-pay | Admitting: Internal Medicine

## 2013-03-20 ENCOUNTER — Other Ambulatory Visit (INDEPENDENT_AMBULATORY_CARE_PROVIDER_SITE_OTHER): Payer: Medicare Other

## 2013-03-20 VITALS — BP 112/80 | HR 59 | Temp 97.8°F | Resp 16 | Ht 69.0 in | Wt 194.0 lb

## 2013-03-20 DIAGNOSIS — N402 Nodular prostate without lower urinary tract symptoms: Secondary | ICD-10-CM | POA: Diagnosis not present

## 2013-03-20 DIAGNOSIS — R209 Unspecified disturbances of skin sensation: Secondary | ICD-10-CM

## 2013-03-20 DIAGNOSIS — H9319 Tinnitus, unspecified ear: Secondary | ICD-10-CM | POA: Diagnosis not present

## 2013-03-20 DIAGNOSIS — E039 Hypothyroidism, unspecified: Secondary | ICD-10-CM

## 2013-03-20 DIAGNOSIS — E785 Hyperlipidemia, unspecified: Secondary | ICD-10-CM | POA: Diagnosis not present

## 2013-03-20 DIAGNOSIS — R202 Paresthesia of skin: Secondary | ICD-10-CM | POA: Insufficient documentation

## 2013-03-20 DIAGNOSIS — H9313 Tinnitus, bilateral: Secondary | ICD-10-CM

## 2013-03-20 LAB — CBC WITH DIFFERENTIAL/PLATELET
BASOS ABS: 0.1 10*3/uL (ref 0.0–0.1)
BASOS PCT: 0.8 % (ref 0.0–3.0)
EOS PCT: 1.2 % (ref 0.0–5.0)
Eosinophils Absolute: 0.1 10*3/uL (ref 0.0–0.7)
HEMATOCRIT: 49.5 % (ref 39.0–52.0)
HEMOGLOBIN: 16.1 g/dL (ref 13.0–17.0)
LYMPHS ABS: 2.4 10*3/uL (ref 0.7–4.0)
Lymphocytes Relative: 22 % (ref 12.0–46.0)
MCHC: 32.6 g/dL (ref 30.0–36.0)
MCV: 92.9 fl (ref 78.0–100.0)
MONOS PCT: 6.8 % (ref 3.0–12.0)
Monocytes Absolute: 0.8 10*3/uL (ref 0.1–1.0)
NEUTROS ABS: 7.7 10*3/uL (ref 1.4–7.7)
Neutrophils Relative %: 69.2 % (ref 43.0–77.0)
Platelets: 215 10*3/uL (ref 150.0–400.0)
RBC: 5.33 Mil/uL (ref 4.22–5.81)
RDW: 13.5 % (ref 11.5–14.6)
WBC: 11.1 10*3/uL — ABNORMAL HIGH (ref 4.5–10.5)

## 2013-03-20 LAB — COMPREHENSIVE METABOLIC PANEL
ALT: 17 U/L (ref 0–53)
AST: 20 U/L (ref 0–37)
Albumin: 3.8 g/dL (ref 3.5–5.2)
Alkaline Phosphatase: 44 U/L (ref 39–117)
BILIRUBIN TOTAL: 1 mg/dL (ref 0.3–1.2)
BUN: 15 mg/dL (ref 6–23)
CALCIUM: 9.1 mg/dL (ref 8.4–10.5)
CHLORIDE: 104 meq/L (ref 96–112)
CO2: 27 meq/L (ref 19–32)
CREATININE: 0.9 mg/dL (ref 0.4–1.5)
GFR: 94.31 mL/min (ref >60.00)
GLUCOSE: 92 mg/dL (ref 70–99)
Potassium: 4.5 mEq/L (ref 3.5–5.1)
Sodium: 141 mEq/L (ref 135–145)
Total Protein: 6.6 g/dL (ref 6.0–8.3)

## 2013-03-20 LAB — LIPID PANEL
Cholesterol: 193 mg/dL (ref 0–200)
HDL: 40.2 mg/dL (ref >39.00)
LDL Cholesterol: 129 mg/dL — ABNORMAL HIGH (ref 0–99)
Total CHOL/HDL Ratio: 5
Triglycerides: 119 mg/dL (ref 0.0–149.0)
VLDL: 23.8 mg/dL (ref 0.0–40.0)

## 2013-03-20 LAB — TSH: TSH: 0.21 u[IU]/mL — AB (ref 0.35–5.50)

## 2013-03-20 LAB — PSA: PSA: 3.91 ng/mL (ref 0.10–4.00)

## 2013-03-20 MED ORDER — LEVOTHYROXINE SODIUM 125 MCG PO TABS: 125.0000 ug | ORAL_TABLET | Freq: Every day | ORAL | Status: DC

## 2013-03-20 NOTE — Patient Instructions (Signed)

## 2013-03-20 NOTE — Assessment & Plan Note (Signed)
I will check his TSH today and will adjust his dose if needed 

## 2013-03-20 NOTE — Assessment & Plan Note (Signed)
The exam is normal I will check his labs today to look for metabolic casues I have asked him to see ENT for further evaluation

## 2013-03-20 NOTE — Assessment & Plan Note (Signed)
I will recheck his PSA today 

## 2013-03-20 NOTE — Assessment & Plan Note (Signed)
The software blocked my order for B12 and folate I will check his labs to look for secondary causes of this

## 2013-03-20 NOTE — Progress Notes (Signed)
Pre visit review using our clinic review tool, if applicable. No additional management support is needed unless otherwise documented below in the visit note. 

## 2013-03-20 NOTE — Progress Notes (Signed)
   Subjective:    Patient ID: Keith [name], male    DOB: 1941/01/30, 73 y.o.   MRN: [id number]  HPI Comments: He returns today and complains of "buzzing" in his ears for several weeks. He also has a small area of numbness over his right foot for several months.     Review of Systems  Constitutional: Negative.  Negative for fever, chills, diaphoresis, activity change, appetite change, fatigue and unexpected weight change.  HENT: Positive for tinnitus. Negative for dental problem, drooling, ear discharge, ear pain, facial swelling, hearing loss, mouth sores, nosebleeds, postnasal drip, rhinorrhea, sinus pressure, sneezing, sore throat, trouble swallowing and voice change.   Eyes: Negative.  Negative for visual disturbance.  Respiratory: Negative.  Negative for cough, choking, chest tightness, shortness of breath, wheezing and stridor.   Cardiovascular: Negative.  Negative for chest pain, palpitations and leg swelling.  Gastrointestinal: Negative.  Negative for nausea, vomiting, abdominal pain, diarrhea, constipation and blood in stool.  Endocrine: Negative.   Genitourinary: Negative.   Musculoskeletal: Negative.  Negative for arthralgias and neck stiffness.  Skin: Negative.   Allergic/Immunologic: Negative.   Neurological: Positive for numbness. Negative for dizziness, tremors, seizures, syncope, facial asymmetry, speech difficulty, weakness, light-headedness and headaches.  Hematological: Negative.  Negative for adenopathy. Does not bruise/bleed easily.  Psychiatric/Behavioral: Negative.        Objective:   Physical Exam  Vitals reviewed. Constitutional: He is oriented to person, place, and time. He appears well-developed and well-nourished. No distress.  HENT:  Head: Normocephalic and atraumatic.  Right Ear: Hearing, tympanic membrane, external ear and ear canal normal.  Left Ear: Hearing, tympanic membrane, external ear and ear canal normal.  Mouth/Throat: Oropharynx is clear and  moist. No oropharyngeal exudate.  Eyes: Conjunctivae are normal. Right eye exhibits no discharge. Left eye exhibits no discharge. No scleral icterus.  Neck: Normal range of motion. Neck supple. No JVD present. No tracheal deviation present. No thyromegaly present.  Cardiovascular: Normal rate, regular rhythm, normal heart sounds and intact distal pulses.  Exam reveals no gallop and no friction rub.   No murmur heard. Pulmonary/Chest: Effort normal and breath sounds normal. No stridor. No respiratory distress. He has no wheezes. He has no rales. He exhibits no tenderness.  Abdominal: Soft. Bowel sounds are normal. He exhibits no distension and no mass. There is no tenderness. There is no rebound and no guarding.  Musculoskeletal: Normal range of motion. He exhibits no edema and no tenderness.  Lymphadenopathy:    He has no cervical adenopathy.  Neurological: He is alert and oriented to person, place, and time. He has normal reflexes. He displays normal reflexes. No cranial nerve deficit. He exhibits normal muscle tone. Coordination normal.  Skin: Skin is warm and dry. No rash noted. He is not diaphoretic. No erythema. No pallor.     Lab Results  Component Value Date   WBC 9.7 04/25/2012   HGB 15.5 04/25/2012   HCT 45.5 04/25/2012   PLT 218.0 04/25/2012   GLUCOSE 95 04/25/2012   CHOL 172 04/25/2012   TRIG 200.0* 04/25/2012   HDL 29.50* 04/25/2012   LDLCALC 103* 04/25/2012   ALT 24 04/25/2012   AST 25 04/25/2012   NA 139 04/25/2012   K 4.4 04/25/2012   CL 105 04/25/2012   CREATININE 1.0 04/25/2012   BUN 14 04/25/2012   CO2 28 04/25/2012   TSH 0.66 04/25/2012   PSA 3.85 12/15/2011       Assessment & Plan:

## 2013-03-20 NOTE — Addendum Note (Signed)
Addended by: Janith Lima on: 03/20/2013 01:35 PM   Modules accepted: Orders, Medications

## 2013-03-20 NOTE — Assessment & Plan Note (Signed)
FLP today - will address if needed

## 2013-03-28 DIAGNOSIS — H9319 Tinnitus, unspecified ear: Secondary | ICD-10-CM | POA: Diagnosis not present

## 2013-03-28 DIAGNOSIS — H903 Sensorineural hearing loss, bilateral: Secondary | ICD-10-CM | POA: Diagnosis not present

## 2013-03-28 DIAGNOSIS — H905 Unspecified sensorineural hearing loss: Secondary | ICD-10-CM | POA: Diagnosis not present

## 2013-04-10 ENCOUNTER — Other Ambulatory Visit: Payer: Self-pay | Admitting: Internal Medicine

## 2013-05-29 ENCOUNTER — Other Ambulatory Visit: Payer: Self-pay | Admitting: Internal Medicine

## 2013-07-24 ENCOUNTER — Encounter: Payer: Self-pay | Admitting: Family Medicine

## 2013-07-24 ENCOUNTER — Other Ambulatory Visit (INDEPENDENT_AMBULATORY_CARE_PROVIDER_SITE_OTHER): Payer: Medicare Other

## 2013-07-24 ENCOUNTER — Ambulatory Visit (INDEPENDENT_AMBULATORY_CARE_PROVIDER_SITE_OTHER): Payer: Medicare Other | Admitting: Family Medicine

## 2013-07-24 VITALS — BP 118/84 | HR 68 | Ht 69.0 in | Wt 193.0 lb

## 2013-07-24 DIAGNOSIS — M25532 Pain in left wrist: Secondary | ICD-10-CM

## 2013-07-24 DIAGNOSIS — M189 Osteoarthritis of first carpometacarpal joint, unspecified: Secondary | ICD-10-CM

## 2013-07-24 DIAGNOSIS — M19049 Primary osteoarthritis, unspecified hand: Secondary | ICD-10-CM | POA: Diagnosis not present

## 2013-07-24 DIAGNOSIS — M25539 Pain in unspecified wrist: Secondary | ICD-10-CM | POA: Diagnosis not present

## 2013-07-24 NOTE — Patient Instructions (Signed)
Good to see you Turmeric 500mg  twice daily.  Ice to thumb 10-20 minutes 2 times a day Tylenol 650mg  three times a day can help Sleeping in gloves can help as well See me when you need me.

## 2013-07-24 NOTE — Assessment & Plan Note (Signed)
Patient had an injection today. We discussed that bracing could also be beneficial which patient declined. We discussed over-the-counter medicines that can help with the arthritic pain. We discussed icing protocol as well as well as possibly wearing gloves at night. Patient and will come back again in 3-4 weeks of continuing to have pain in the likely make him a custom brace at that time.  Spent greater than 25 minutes with patient face-to-face and had greater than 50% of counseling including as described above in assessment and plan.

## 2013-07-24 NOTE — Progress Notes (Signed)
CC: Left thumb pain  HPI: 73 yo male coming in for pain in the left thumb. Patient states that 3 weeks ago he is having severe pain. Patient does not remember any true injury. Patient states the pain is improved somewhat but still has a dull aching sensation that can have a sharp pain with certain movements. When patient has to attempt to open a door use it in certain ways he has pain. Patient has to lift himself from a seated position he can he did have pain. Denies any numbness or any radiation of the pain. Patient denies any loss of strength but does not do certain activities. 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 Patient states that the severity is 5/10  Past medical, surgical, family and social history reviewed. Medications reviewed all in the electronic medical record.  Review of Systems: No headache, visual changes, nausea, vomiting, diarrhea, constipation, dizziness, abdominal pain, skin rash, fevers, chills, night sweats, weight loss, swollen lymph nodes, body aches, joint swelling, muscle aches, chest pain, shortness of breath, mood changes.   Objective:    Blood pressure 118/84, pulse 68, height 5\' 9"  (1.753 m), weight 193 lb (87.544 kg), SpO2 97.00%.   General: No apparent distress alert and oriented x3 mood and affect normal, dressed appropriately.  HEENT: Pupils equal, extraocular movements intact Respiratory: Patient's speak in full sentences and does not appear short of breath Cardiovascular: No lower extremity edema, non tender, no erythema Skin: Warm dry intact with no signs of infection or rash on extremities or on axial skeleton. Abdomen: Soft nontender Neuro: Cranial nerves II through XII are intact, neurovascularly intact in all extremities with 2+ DTRs and 2+ pulses. Lymph: No lymphadenopathy of posterior or anterior cervical chain or axillae bilaterally.   Gait normal with good balance and coordination.  MSK: Non tender with full range of motion and good stability and symmetric strength and tone of the hip, shoulders, elbows,  knee and ankles bilaterally.  Wrist: Left Inspection normal with no visible erythema or swelling. ROM smooth and normal with good flexion and extension and ulnar/radial deviation that is symmetrical with opposite wrist. No snuffbox tenderness. No tenderness over Canal of [place]. Strength 5/5 in all directions without pain. Negative Finkelstein, tinel's and phalens. Negative Watson's test. Positive pain over the [facility] joint with palpation as well as positive grind test  MSK US performed of: Left wrist This study was ordered, performed, and interpreted by Charlann Boxer D.O.  Wrist: All extensor compartments visualized and tendons all normal in appearance without fraying, tears, or sheath effusions. No effusion seen. TFCC intact. Scapholunate ligament intact. Carpal tunnel visualized and median nerve area normal, flexor tendons all normal in appearance without fraying, tears, or sheath effusions. Power doppler signal normal. Patient does have severe arthritis of the [facility] joint  IMPRESSION:  Severe osteoarthritic changes of the CMC joint.    Procedure: Real-time Ultrasound Guided Injection of the left CMC joint  Device: GE Logiq E  Ultrasound guided injection is preferred based studies that show increased duration, increased effect, greater accuracy, decreased procedural pain, increased response rate with ultrasound guided versus blind injection.  Verbal informed consent obtained.  Time-out conducted.  Noted no overlying erythema, induration, or other signs of local infection.  Skin prepped in a sterile fashion.  Local anesthesia: Topical Ethyl chloride.  Patient then under ultrasound did have [place] joint localized. Patient then  had 25-gauge 1 inch needle injected into the [facility] joint and had 0.5 cc of 0.5% Marcaine and  0.5 cc of Kenalog 40 mg/dL. Completed without difficulty  Pain immediately resolved suggesting accurate placement of the medication.  Advised to call if fevers/chills, erythema, induration, drainage, or persistent bleeding.  Images permanently stored and available for review in the ultrasound unit.  Impression: Technically successful ultrasound guided injection.  Impression and Recommendations:     This case required medical decision making of moderate complexity.

## 2013-08-22 ENCOUNTER — Other Ambulatory Visit: Payer: Self-pay | Admitting: Internal Medicine

## 2013-10-07 ENCOUNTER — Other Ambulatory Visit: Payer: Self-pay | Admitting: Internal Medicine

## 2013-10-09 ENCOUNTER — Telehealth: Payer: Self-pay | Admitting: Internal Medicine

## 2013-10-09 MED ORDER — LEVOTHYROXINE SODIUM 150 MCG PO TABS: ORAL_TABLET | ORAL | Status: DC

## 2013-10-09 NOTE — Telephone Encounter (Signed)
Pt need re-fill on levothyroxine (SYNTHROID, LEVOTHROID) 150 MCG tablet [facility] [place], [place]

## 2013-10-09 NOTE — Telephone Encounter (Signed)
Rx approved and faxed to mail order.

## 2013-10-31 ENCOUNTER — Encounter: Payer: Self-pay | Admitting: Family Medicine

## 2013-10-31 ENCOUNTER — Ambulatory Visit (INDEPENDENT_AMBULATORY_CARE_PROVIDER_SITE_OTHER): Payer: Medicare Other | Admitting: Family Medicine

## 2013-10-31 ENCOUNTER — Other Ambulatory Visit (INDEPENDENT_AMBULATORY_CARE_PROVIDER_SITE_OTHER): Payer: Medicare Other

## 2013-10-31 ENCOUNTER — Ambulatory Visit (INDEPENDENT_AMBULATORY_CARE_PROVIDER_SITE_OTHER)
Admission: RE | Admit: 2013-10-31 | Discharge: 2013-10-31 | Disposition: A | Discharge status: Home / Self Care | Payer: Medicare Other | Source: Ambulatory Visit | Attending: Family Medicine | Admitting: Family Medicine

## 2013-10-31 VITALS — BP 112/80 | HR 59 | Ht 69.0 in | Wt 196.0 lb

## 2013-10-31 DIAGNOSIS — M25511 Pain in right shoulder: Secondary | ICD-10-CM

## 2013-10-31 DIAGNOSIS — M19019 Primary osteoarthritis, unspecified shoulder: Secondary | ICD-10-CM

## 2013-10-31 DIAGNOSIS — M25519 Pain in unspecified shoulder: Secondary | ICD-10-CM

## 2013-10-31 DIAGNOSIS — M75101 Unspecified rotator cuff tear or rupture of right shoulder, not specified as traumatic: Secondary | ICD-10-CM

## 2013-10-31 DIAGNOSIS — M12811 Other specific arthropathies, not elsewhere classified, right shoulder: Secondary | ICD-10-CM | POA: Insufficient documentation

## 2013-10-31 MED ORDER — MELOXICAM 15 MG PO TABS: 15.0000 mg | ORAL_TABLET | Freq: Every day | ORAL | Status: DC

## 2013-10-31 NOTE — Assessment & Plan Note (Signed)
Patient does have some moderate arthritis but also has a rotator cuff tear of the subscapularis. Patient does have subacromial bursitis and was given an injection today. The broken patient does respond fairly well. Patient will continue with oral anti-inflammatories, icing, home exercises, and will come back and see me again in 3-4 weeks. Patient continues to have difficulty we may need consider further imaging her physical therapy. Depending on the amount of osteoarthritic changes patient may not be a surgical candidate for rotator cuff repair if this continues to give him difficulty.

## 2013-10-31 NOTE — Progress Notes (Signed)
[name] [place], [place] [address] Phone: [phone] Subjective:    I'm seeing this patient by the request  of:  Right shoulder pain  CC: Right shoulder pain  [id number] Keith [name] is a 73 y.o. male coming in with complaint of shoulder pain. Patient states he's had this pain for multiple months if not years. Patient states though that seems to be worsening. Patient has been going to the gym on a regular basis but started having more discomfort. Patient states that when he lifts dumbbells or tries to reach across his body he has more pain. States that also it can be a dull aching pain that radiates down his arm somewhat. Denies any neck pain. Rates the severity of 7/10. No loss of strength. He can wake him up at night.      Past medical history, social, surgical and family history all reviewed in electronic medical record.   Review of Systems: No headache, visual changes, nausea, vomiting, diarrhea, constipation, dizziness, abdominal pain, skin rash, fevers, chills, night sweats, weight loss, swollen lymph nodes, body aches, joint swelling, muscle aches, chest pain, shortness of breath, mood changes.   Objective Blood pressure 112/80, pulse 59, height 5\' 9"  (1.753 m), weight 196 lb (88.905 kg), SpO2 98.00%.  General: No apparent distress alert and oriented x3 mood and affect normal, dressed appropriately.  HEENT: Pupils equal, extraocular movements intact  Respiratory: Patient's speak in full sentences and does not appear short of breath  Cardiovascular: No lower extremity edema, non tender, no erythema  Skin: Warm dry intact with no signs of infection or rash on extremities or on axial skeleton.  Abdomen: Soft nontender  Neuro: Cranial nerves II through XII are intact, neurovascularly intact in all extremities with 2+ DTRs and 2+ pulses.  Lymph: No lymphadenopathy of posterior or anterior cervical chain or axillae bilaterally.  Gait  normal with good balance and coordination.  MSK:  Non tender with full range of motion and good stability and symmetric strength and tone of  elbows, wrist, hip, knee and ankles bilaterally.   Shoulder: Right Inspection reveals no abnormalities, atrophy or asymmetry. Palpation is normal with no tenderness over AC joint or bicipital groove. ROM is full in all planes passively. Rotator cuff strength normal throughout. signs of impingement with positive Neer and Hawkin's tests, but negative empty can sign. Speeds and Yergason's tests normal. No labral pathology noted with negative Obrien's, negative clunk and good stability. Normal scapular function observed. No painful arc and no drop arm sign. No apprehension sign  MSK US performed of: Right This study was ordered, performed, and interpreted by Keith [name] D.O.  Shoulder:   Supraspinatus:  Appears normal on long and transverse views, Bursal bulge seen with shoulder abduction on impingement view. Infraspinatus:  Appears normal on long and transverse views. Significant increase in Doppler flow Subscapularis:  She does have a small 25% tear on the articular surface of the subscapularis tendon. No significant retraction noted. Teres Minor:  Appears normal on long and transverse views. AC joint:  Capsule undistended, no geyser sign. Glenohumeral Joint:  Appears normal without effusion. Odder osteoarthritic changes Glenoid Labrum:  Intact without visualized tears. Biceps Tendon:  Appears normal on long and transverse views, no fraying of tendon, tendon located in intertubercular groove, no subluxation with shoulder internal or external rotation.  Impression: Subacromial bursitis with articular side rotator cuff tear of the subscapularis  Procedure: Real-time Ultrasound Guided Injection of right glenohumeral joint  Device: GE Logiq E  Ultrasound guided injection is preferred based studies that show increased duration, increased effect, greater  accuracy, decreased procedural pain, increased response rate with ultrasound guided versus blind injection.  Verbal informed consent obtained.  Time-out conducted.  Noted no overlying erythema, induration, or other signs of local infection.  Skin prepped in a sterile fashion.  Local anesthesia: Topical Ethyl chloride.  With sterile technique and under real time ultrasound guidance:  Joint visualized.  23g 1  inch needle inserted posterior approach. Pictures taken for needle placement. Patient did have injection of 2 cc of 1% lidocaine, 2 cc of 0.5% Marcaine, and 1.0 cc of Kenalog 40 mg/dL. Completed without difficulty  Pain immediately resolved suggesting accurate placement of the medication.  Advised to call if fevers/chills, erythema, induration, drainage, or persistent bleeding.  Images permanently stored and available for review in the ultrasound unit.  Impression: Technically successful ultrasound guided injection.     Impression and Recommendations:     This case required medical decision making of moderate complexity.

## 2013-10-31 NOTE — Patient Instructions (Signed)
Good to see you.  Glucosamine 1500mg  daily Continue the vitamin D and turmeric Meloxicam daily for 5 days then as needed Ice 20 minutes 2 times daily. Usually after activity and before bed. Drop weight to 50% and increase 10% a week Xray downstairs today.  Com eback and see me again in 3-4 weeks.

## 2013-11-01 ENCOUNTER — Telehealth: Payer: Self-pay | Admitting: Family Medicine

## 2013-11-01 MED ORDER — MELOXICAM 15 MG PO TABS: 15.0000 mg | ORAL_TABLET | Freq: Every day | ORAL | Status: DC

## 2013-11-01 NOTE — Telephone Encounter (Signed)
Sent rx to correct pharmacy. Left msg making pt aware.

## 2013-11-01 NOTE — Telephone Encounter (Signed)
Patient states that Dr. Tamala Julian was to call a script in to [facility] on [address].  Pharmacy has not received script yet.

## 2013-11-29 ENCOUNTER — Encounter: Payer: Self-pay | Admitting: Family Medicine

## 2013-11-29 ENCOUNTER — Ambulatory Visit (INDEPENDENT_AMBULATORY_CARE_PROVIDER_SITE_OTHER): Payer: Medicare Other | Admitting: Family Medicine

## 2013-11-29 VITALS — BP 130/92 | HR 64 | Temp 98.3°F | Wt 196.0 lb

## 2013-11-29 DIAGNOSIS — M12811 Other specific arthropathies, not elsewhere classified, right shoulder: Secondary | ICD-10-CM | POA: Diagnosis not present

## 2013-11-29 DIAGNOSIS — M75101 Unspecified rotator cuff tear or rupture of right shoulder, not specified as traumatic: Principal | ICD-10-CM

## 2013-11-29 NOTE — Progress Notes (Signed)
Pre visit review using our clinic review tool, if applicable. No additional management support is needed unless otherwise documented below in the visit note. 

## 2013-11-29 NOTE — Progress Notes (Signed)
  [name] [place], [place] [address] Phone: [phone] Subjective:     CC: Right shoulder pain follow up  [id number] Keith [name] is a 73 y.o. male coming in with complaint of shoulder pain. Patient was seen previously and had more of a subacromial bursitis and a small articular side and subscapularis tear. Patient was given an injection at that time and given home exercises and icing protocol. Patient states that he is 100% better. Patient has been able to sleep comfortably as well as throw a football with his grandson so he could not be happier. Patient is very happy with the results. No weakness or numbness noted.     Past medical history, social, surgical and family history all reviewed in electronic medical record.   Review of Systems: No headache, visual changes, nausea, vomiting, diarrhea, constipation, dizziness, abdominal pain, skin rash, fevers, chills, night sweats, weight loss, swollen lymph nodes, body aches, joint swelling, muscle aches, chest pain, shortness of breath, mood changes.   Objective Blood pressure 130/92, pulse 64, temperature 98.3 F (36.8 C), temperature source Oral, weight 196 lb (88.905 kg), SpO2 97.00%.  General: No apparent distress alert and oriented x3 mood and affect normal, dressed appropriately.  HEENT: Pupils equal, extraocular movements intact  Respiratory: Patient's speak in full sentences and does not appear short of breath  Cardiovascular: No lower extremity edema, non tender, no erythema  Skin: Warm dry intact with no signs of infection or rash on extremities or on axial skeleton.  Abdomen: Soft nontender  Neuro: Cranial nerves II through XII are intact, neurovascularly intact in all extremities with 2+ DTRs and 2+ pulses.  Lymph: No lymphadenopathy of posterior or anterior cervical chain or axillae bilaterally.  Gait normal with good balance and coordination.  MSK:  Non tender with full range  of motion and good stability and symmetric strength and tone of  elbows, wrist, hip, knee and ankles bilaterally.   Shoulder: Right Inspection reveals no abnormalities, atrophy or asymmetry. Palpation is normal with no tenderness over AC joint or bicipital groove. ROM is full in all planes passively. Rotator cuff strength normal throughout. Minimal signs of impingement still left. Speeds and Yergason's tests normal. No labral pathology noted with negative Obrien's, negative clunk and good stability. Normal scapular function observed. No painful arc and no drop arm sign. No apprehension sign  X-rays were reviewed by me today. X-rays show that patient does have moderate osteophytic changes of the glenohumeral joint.    Impression and Recommendations:     This case required medical decision making of moderate complexity.

## 2013-11-29 NOTE — Assessment & Plan Note (Signed)
Patient is doing significantly better after ultrasound-guided steroid injection 3 weeks ago. Patient is able to do all activities of daily living and is sleeping comfortably at night. We discussed continuing home exercises and the icing protocol for another 6 weeks. We discussed which activities to avoid. Patient can have this injection repeated every 3-4 months as needed. Patient will come back on an as-needed basis.

## 2013-11-29 NOTE — Patient Instructions (Signed)
Verbal instructions given

## 2014-01-29 ENCOUNTER — Encounter: Payer: Self-pay | Admitting: Family Medicine

## 2014-01-29 ENCOUNTER — Ambulatory Visit (INDEPENDENT_AMBULATORY_CARE_PROVIDER_SITE_OTHER): Payer: Medicare Other | Admitting: Family Medicine

## 2014-01-29 ENCOUNTER — Other Ambulatory Visit (INDEPENDENT_AMBULATORY_CARE_PROVIDER_SITE_OTHER): Payer: Medicare Other

## 2014-01-29 VITALS — BP 126/84 | HR 65 | Ht 69.0 in | Wt 198.0 lb

## 2014-01-29 DIAGNOSIS — M79672 Pain in left foot: Secondary | ICD-10-CM | POA: Diagnosis not present

## 2014-01-29 DIAGNOSIS — M7732 Calcaneal spur, left foot: Secondary | ICD-10-CM

## 2014-01-29 MED ORDER — MELOXICAM 15 MG PO TABS: 15.0000 mg | ORAL_TABLET | Freq: Every day | ORAL | Status: DC

## 2014-01-29 NOTE — Patient Instructions (Signed)
Good to see you Ice bath 10 minutes at night.  Meloxicam daily for 10 days then as needed Wear heel lift in left shoe Increase vitamin D 4000 IU daily for 2 weeks and then back to 2000 IU daily.  See me again in 2-3 weeks.

## 2014-01-29 NOTE — Progress Notes (Signed)
  [name] [place], [place] [address] Phone: [phone] Subjective:      CC: Left heel pain  [id number] Keith [name] is a 73 y.o. male coming in with complaint of left heel pain. Patient states has been going on for  1 week. Patient remembers walking in terrible shoes and had pain immediately of the heel. Patient states now any ambulation seems to cause more and more pain. This is not stopping him from any activities but is very painful. Denies any discoloration or swelling. Patient denies any nighttime awakening. Patient is completely pain-free when not weightbearing. No radiation to the toes. Patient rates the severity is 7 out of 10. States that he did respond somewhat to ibuprofen.     Past medical history, social, surgical and family history all reviewed in electronic medical record.   Review of Systems: No headache, visual changes, nausea, vomiting, diarrhea, constipation, dizziness, abdominal pain, skin rash, fevers, chills, night sweats, weight loss, swollen lymph nodes, body aches, joint swelling, muscle aches, chest pain, shortness of breath, mood changes.   Objective Blood pressure 126/84, pulse 65, height 5\' 9"  (1.753 m), weight 198 lb (89.812 kg), SpO2 97 %.  General: No apparent distress alert and oriented x3 mood and affect normal, dressed appropriately.  HEENT: Pupils equal, extraocular movements intact  Respiratory: Patient's speak in full sentences and does not appear short of breath  Cardiovascular: No lower extremity edema, non tender, no erythema  Skin: Warm dry intact with no signs of infection or rash on extremities or on axial skeleton.  Abdomen: Soft nontender  Neuro: Cranial nerves II through XII are intact, neurovascularly intact in all extremities with 2+ DTRs and 2+ pulses.  Lymph: No lymphadenopathy of posterior or anterior cervical chain or axillae bilaterally.  Gait normal with good balance and coordination.   MSK:  Non tender with full range of motion and good stability and symmetric strength and tone of shoulders, elbows, wrist, hip, knee and ankles bilaterally. 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 Patient though is tender to palpation on the plantar aspect over the calcaneal region. No pain over the Achilles. Full range of motion of the ankle. Neurovascularly intact distally. Patient is able to ambulate 4 steps without any antalgic gait. Contralateral foot unremarkable  MSK US performed of: Left foot This study was ordered, performed, and interpreted by Charlann Boxer D.O.  Foot/Ankle:   All structures visualized.   Talar dome unremarkable  Ankle mortise without effusion. Peroneus longus and brevis tendons unremarkable on long and transverse views without sheath effusions. Posterior tibialis, flexor hallucis longus, and flexor digitorum longus tendons unremarkable on long and transverse views without sheath effusions. Achilles tendon visualized along length of tendon and unremarkable on long and transverse views without sheath effusion. Anterior Talofibular Ligament and Calcaneofibular Ligaments unremarkable and intact. Deltoid Ligament unremarkable and intact. Plantar fascia intact and without effusion, normal thickness. No increased doppler signal, cap sign, or thickening of tibial cortex. Patient though does have what appears to be a calcaneal fracture. This looks to be more of a bone spur that has fractured. Increasing Doppler flow and hypoechoic changes noted. Power doppler signal normal.  IMPRESSION:  Small nondisplaced calcaneal bone spur fracture..     Impression and Recommendations:     This case required medical decision making of moderate complexity.

## 2014-01-29 NOTE — Assessment & Plan Note (Signed)
Patient was given an heel lift today. We discussed icing protocol and patient was given a short course of meloxicam. Discussed with patient that injection would not be beneficial at this time. Discussed the possibility of a Cam Walker which patient declined. Patient and will come back in 2-3 weeks and we'll ultrasound again likely to make sure that there is callus formation. Patient will increase vitamin D. Patient will come back as stated in 2-3 weeks.  Spent greater than 25 minutes with patient face-to-face and had greater than 50% of counseling including as described above in assessment and plan.

## 2014-02-07 HISTORY — PX: BACK SURGERY: SHX140

## 2014-03-07 ENCOUNTER — Other Ambulatory Visit (INDEPENDENT_AMBULATORY_CARE_PROVIDER_SITE_OTHER): Payer: Medicare Other

## 2014-03-07 ENCOUNTER — Encounter: Payer: Self-pay | Admitting: Internal Medicine

## 2014-03-07 ENCOUNTER — Ambulatory Visit (INDEPENDENT_AMBULATORY_CARE_PROVIDER_SITE_OTHER): Payer: Medicare Other | Admitting: Internal Medicine

## 2014-03-07 VITALS — BP 120/82 | HR 76 | Temp 97.8°F | Resp 16 | Ht 69.0 in | Wt 201.0 lb

## 2014-03-07 DIAGNOSIS — Z23 Encounter for immunization: Secondary | ICD-10-CM | POA: Diagnosis not present

## 2014-03-07 DIAGNOSIS — E038 Other specified hypothyroidism: Secondary | ICD-10-CM

## 2014-03-07 DIAGNOSIS — N402 Nodular prostate without lower urinary tract symptoms: Secondary | ICD-10-CM

## 2014-03-07 DIAGNOSIS — M549 Dorsalgia, unspecified: Secondary | ICD-10-CM

## 2014-03-07 DIAGNOSIS — K148 Other diseases of tongue: Secondary | ICD-10-CM | POA: Insufficient documentation

## 2014-03-07 LAB — CBC WITH DIFFERENTIAL/PLATELET
BASOS PCT: 0.7 % (ref 0.0–3.0)
Basophils Absolute: 0.1 10*3/uL (ref 0.0–0.1)
EOS PCT: 3.3 % (ref 0.0–5.0)
Eosinophils Absolute: 0.3 10*3/uL (ref 0.0–0.7)
HCT: 46.1 % (ref 39.0–52.0)
HEMOGLOBIN: 15.7 g/dL (ref 13.0–17.0)
LYMPHS ABS: 2 10*3/uL (ref 0.7–4.0)
LYMPHS PCT: 19.7 % (ref 12.0–46.0)
MCHC: 33.9 g/dL (ref 30.0–36.0)
MCV: 89.2 fl (ref 78.0–100.0)
MONO ABS: 0.7 10*3/uL (ref 0.1–1.0)
Monocytes Relative: 6.9 % (ref 3.0–12.0)
NEUTROS ABS: 6.9 10*3/uL (ref 1.4–7.7)
Neutrophils Relative %: 69.4 % (ref 43.0–77.0)
Platelets: 209 10*3/uL (ref 150.0–400.0)
RBC: 5.17 Mil/uL (ref 4.22–5.81)
RDW: 13.5 % (ref 11.5–15.5)
WBC: 10 10*3/uL (ref 4.0–10.5)

## 2014-03-07 LAB — TSH: TSH: 0.99 u[IU]/mL (ref 0.35–4.50)

## 2014-03-07 LAB — LIPID PANEL
Cholesterol: 172 mg/dL (ref 0–200)
HDL: 40.5 mg/dL (ref >39.00)
LDL CALC: 103 mg/dL — AB (ref 0–99)
NONHDL: 131.5
TRIGLYCERIDES: 145 mg/dL (ref 0.0–149.0)
Total CHOL/HDL Ratio: 4
VLDL: 29 mg/dL (ref 0.0–40.0)

## 2014-03-07 LAB — COMPREHENSIVE METABOLIC PANEL
ALT: 17 U/L (ref 0–53)
AST: 18 U/L (ref 0–37)
Albumin: 4 g/dL (ref 3.5–5.2)
Alkaline Phosphatase: 45 U/L (ref 39–117)
BUN: 19 mg/dL (ref 6–23)
CALCIUM: 9.2 mg/dL (ref 8.4–10.5)
CO2: 25 mEq/L (ref 19–32)
CREATININE: 1.25 mg/dL (ref 0.40–1.50)
Chloride: 109 mEq/L (ref 96–112)
GFR: 60.27 mL/min (ref >60.00)
GLUCOSE: 105 mg/dL — AB (ref 70–99)
Potassium: 4.6 mEq/L (ref 3.5–5.1)
SODIUM: 143 meq/L (ref 135–145)
TOTAL PROTEIN: 6.6 g/dL (ref 6.0–8.3)
Total Bilirubin: 0.8 mg/dL (ref 0.2–1.2)

## 2014-03-07 LAB — PSA: PSA: 3.82 ng/mL (ref 0.10–4.00)

## 2014-03-07 NOTE — Progress Notes (Signed)
Pre visit review using our clinic review tool, if applicable. No additional management support is needed unless otherwise documented below in the visit note. 

## 2014-03-07 NOTE — Patient Instructions (Signed)
Hypothyroidism The thyroid is a large gland located in the lower front of your neck. The thyroid gland helps control metabolism. Metabolism is how your body handles food. It controls metabolism with the hormone thyroxine. When this gland is underactive (hypothyroid), it produces too little hormone.  CAUSES These include:   Absence or destruction of thyroid tissue.  Goiter due to iodine deficiency.  Goiter due to medications.  Congenital defects (since birth).  Problems with the pituitary. This causes a lack of TSH (thyroid stimulating hormone). This hormone tells the thyroid to turn out more hormone. SYMPTOMS  Lethargy (feeling as though you have no energy)  Cold intolerance  Weight gain (in spite of normal food intake)  Dry skin  Coarse hair  Menstrual irregularity (if severe, may lead to infertility)  Slowing of thought processes Cardiac problems are also caused by insufficient amounts of thyroid hormone. Hypothyroidism in the newborn is cretinism, and is an extreme form. It is important that this form be treated adequately and immediately or it will lead rapidly to retarded physical and mental development. DIAGNOSIS  To prove hypothyroidism, your caregiver may do blood tests and ultrasound tests. Sometimes the signs are hidden. It may be necessary for your caregiver to watch this illness with blood tests either before or after diagnosis and treatment. TREATMENT  Low levels of thyroid hormone are increased by using synthetic thyroid hormone. This is a safe, effective treatment. It usually takes about four weeks to gain the full effects of the medication. After you have the full effect of the medication, it will generally take another four weeks for problems to leave. Your caregiver may start you on low doses. If you have had heart problems the dose may be gradually increased. It is generally not an emergency to get rapidly to normal. HOME CARE INSTRUCTIONS   Take your  medications as your caregiver suggests. Let your caregiver know of any medications you are taking or start taking. Your caregiver will help you with dosage schedules.  As your condition improves, your dosage needs may increase. It will be necessary to have continuing blood tests as suggested by your caregiver.  Report all suspected medication side effects to your caregiver. SEEK MEDICAL CARE IF: Seek medical care if you develop:  Sweating.  Tremulousness (tremors).  Anxiety.  Rapid weight loss.  Heat intolerance.  Emotional swings.  Diarrhea.  Weakness. SEEK IMMEDIATE MEDICAL CARE IF:  You develop chest pain, an irregular heart beat (palpitations), or a rapid heart beat. MAKE SURE YOU:   Understand these instructions.  Will watch your condition.  Will get help right away if you are not doing well or get worse. Document Released: 01/24/2005 Document Revised: 04/18/2011 Document Reviewed: 09/14/2007 Exit[employer] Patient Information 2015 [employer]. This information is not intended to replace advice given to you by your health care provider. Make sure you discuss any questions you have with your health care provider.  

## 2014-03-07 NOTE — Assessment & Plan Note (Signed)
I have asked him to see ENT to consider having this lesion biopsied

## 2014-03-07 NOTE — Progress Notes (Signed)
   Subjective:    Patient ID: Keith [name], male    DOB: [birth year], 74 y.o.   MRN: [id number]  Thyroid Problem Presents for follow-up visit. Patient reports no anxiety, cold intolerance, constipation, depressed mood, diaphoresis, diarrhea, dry skin, fatigue, hair loss, heat intolerance, hoarse voice, leg swelling, nail problem, palpitations, tremors, visual change, weight gain or weight loss. The symptoms have been stable. Past treatments include levothyroxine. The treatment provided significant relief.      Review of Systems  Constitutional: Negative.  Negative for chills, weight loss, weight gain, diaphoresis, appetite change, fatigue and unexpected weight change.  HENT: Negative.  Negative for hoarse voice, sinus pressure and trouble swallowing.        He is concerned about a white/raised lesion on the rt side of his tongue  Eyes: Negative.   Respiratory: Negative.   Cardiovascular: Negative.  Negative for chest pain, palpitations and leg swelling.  Gastrointestinal: Negative.  Negative for abdominal pain, diarrhea and constipation.  Endocrine: Negative.  Negative for cold intolerance and heat intolerance.  Genitourinary: Negative.   Musculoskeletal: Positive for back pain and arthralgias. Negative for myalgias and neck pain.  Skin: Negative.   Allergic/Immunologic: Negative.   Neurological: Negative.  Negative for tremors.  Hematological: Negative.  Negative for adenopathy. Does not bruise/bleed easily.  Psychiatric/Behavioral: Negative.  The patient is not nervous/anxious.        Objective:   Physical Exam  Constitutional: He is oriented to person, place, and time. He appears well-developed and well-nourished. No distress.  HENT:  Head: Normocephalic and atraumatic.  Mouth/Throat: Oropharynx is clear and moist. No oropharyngeal exudate.  On the right side of the mid-tongue area there is a discreet, round, whitish, raise lesion that measures about 4 mm, there is no TTP or  swelling  Eyes: Conjunctivae are normal. Right eye exhibits no discharge. Left eye exhibits no discharge. No scleral icterus.  Neck: Normal range of motion. Neck supple. No JVD present. No tracheal deviation present. No thyromegaly present.  Cardiovascular: Normal rate, regular rhythm, normal heart sounds and intact distal pulses.  Exam reveals no gallop and no friction rub.   No murmur heard. Pulmonary/Chest: Effort normal and breath sounds normal. No stridor. No respiratory distress. He has no wheezes. He has no rales. He exhibits no tenderness.  Abdominal: Soft. Bowel sounds are normal. He exhibits no distension and no mass. There is no tenderness. There is no rebound and no guarding.  Musculoskeletal: Normal range of motion. He exhibits no edema or tenderness.  Lymphadenopathy:    He has no cervical adenopathy.  Neurological: He is oriented to person, place, and time.  Skin: Skin is warm and dry. No rash noted. He is not diaphoretic. No erythema. No pallor.  Vitals reviewed.    Lab Results  Component Value Date   WBC 11.1* 03/20/2013   HGB 16.1 03/20/2013   HCT 49.5 03/20/2013   PLT 215.0 03/20/2013   GLUCOSE 92 03/20/2013   CHOL 193 03/20/2013   TRIG 119.0 03/20/2013   HDL 40.20 03/20/2013   LDLCALC 129* 03/20/2013   ALT 17 03/20/2013   AST 20 03/20/2013   NA 141 03/20/2013   K 4.5 03/20/2013   CL 104 03/20/2013   CREATININE 0.9 03/20/2013   BUN 15 03/20/2013   CO2 27 03/20/2013   TSH 0.21* 03/20/2013   PSA 3.91 03/20/2013       Assessment & Plan:

## 2014-03-07 NOTE — Assessment & Plan Note (Signed)
I will recheck his PSA to see if there is concern for prostate cancer

## 2014-03-07 NOTE — Assessment & Plan Note (Signed)
Refer back to sports med for repeat evaluation

## 2014-03-07 NOTE — Assessment & Plan Note (Signed)
His TSH is in the normal range He will stay on the current dose 

## 2014-03-10 ENCOUNTER — Ambulatory Visit (INDEPENDENT_AMBULATORY_CARE_PROVIDER_SITE_OTHER)
Admission: RE | Admit: 2014-03-10 | Discharge: 2014-03-10 | Disposition: A | Discharge status: Home / Self Care | Payer: Medicare Other | Source: Ambulatory Visit | Attending: Family Medicine | Admitting: Family Medicine

## 2014-03-10 ENCOUNTER — Ambulatory Visit (INDEPENDENT_AMBULATORY_CARE_PROVIDER_SITE_OTHER): Payer: Medicare Other | Admitting: Family Medicine

## 2014-03-10 ENCOUNTER — Encounter: Payer: Self-pay | Admitting: Family Medicine

## 2014-03-10 VITALS — BP 132/86 | HR 68 | Ht 69.0 in | Wt 201.0 lb

## 2014-03-10 DIAGNOSIS — M5416 Radiculopathy, lumbar region: Secondary | ICD-10-CM

## 2014-03-10 DIAGNOSIS — M5136 Other intervertebral disc degeneration, lumbar region: Secondary | ICD-10-CM | POA: Diagnosis not present

## 2014-03-10 DIAGNOSIS — M4316 Spondylolisthesis, lumbar region: Secondary | ICD-10-CM | POA: Diagnosis not present

## 2014-03-10 MED ORDER — METHYLPREDNISOLONE ACETATE 80 MG/ML IJ SUSP
80.0000 mg | Freq: Once | INTRAMUSCULAR | Status: AC
  Administered 2014-03-10: 80 mg via INTRAMUSCULAR

## 2014-03-10 MED ORDER — PREDNISONE 50 MG PO TABS: 50.0000 mg | ORAL_TABLET | Freq: Every day | ORAL | Status: DC

## 2014-03-10 MED ORDER — KETOROLAC TROMETHAMINE 60 MG/2ML IM SOLN
60.0000 mg | Freq: Once | INTRAMUSCULAR | Status: AC
  Administered 2014-03-10: 60 mg via INTRAMUSCULAR

## 2014-03-10 NOTE — Progress Notes (Signed)
Pre visit review using our clinic review tool, if applicable. No additional management support is needed unless otherwise documented below in the visit note. 

## 2014-03-10 NOTE — Progress Notes (Signed)
  [name] [address], [place] [address] Phone: [phone] Subjective:    CC:  Right leg pain  IOX:[id number] Keith [name] is a 74 y.o. male coming in with complaint of  Right leg pain. Patient states that this started approximately 1 week ago. Patient states that it seems to happen when he was going from a seated to standing position when he had an electrical shock on down the posterior aspect of his leg. Patient states since that time continues to give him difficulty with certain range of motion. Patient states still when he bends over to get up out of the seat seems to be worse. Patient has not notice any significant weakness but states that his right foot seems to be somewhat difficult to control. Denies any numbness specifically. Patient does not remember any true injury. Patient has had back pain before. 2013 x-ray showed a spinal listhesis on L4-L5 but otherwise mild osteophytic changes. Patient states it does not seem to be improving.     Past medical history, social, surgical and family history all reviewed in electronic medical record.   Review of Systems: No headache, visual changes, nausea, vomiting, diarrhea, constipation, dizziness, abdominal pain, skin rash, fevers, chills, night sweats, weight loss, swollen lymph nodes, body aches, joint swelling, muscle aches, chest pain, shortness of breath, mood changes.   Objective Blood pressure 132/86, pulse 68, height 5\' 9"  (1.753 m), weight 201 lb (91.173 kg), SpO2 98 %.  General: No apparent distress alert and oriented x3 mood and affect normal, dressed appropriately.  HEENT: Pupils equal, extraocular movements intact  Respiratory: Patient's speak in full sentences and does not appear short of breath  Cardiovascular: No lower extremity edema, non tender, no erythema  Skin: Warm dry intact with no signs of infection or rash on extremities or on axial skeleton.  Abdomen: Soft nontender  Neuro:  Cranial nerves II through XII are intact, neurovascularly intact in all extremities with 2+ DTRs and 2+ pulses.  Lymph: No lymphadenopathy of posterior or anterior cervical chain or axillae bilaterally.  Gait  Mild antalgic gait MSK:  Non tender with full range of motion and good stability and symmetric strength and tone of shoulders, elbows, wrist, hip, knees bilaterally.  Back Exam:  Inspection: Unremarkable  Motion: Flexion 45 deg, Extension 30 deg, Side Bending to 45 deg bilaterally,  Rotation to 35 deg bilaterally  SLR laying: Positive right XSLR laying: Negative  Palpable tenderness:  I'll tenderness over the right sacroiliac joint FABER: Positive right Sensory change: Gross sensation intact to all lumbar and sacral dermatomes.  Reflexes: 2+ at both patellar tendons,  1+ Achilles tendon on the right compared to 2+ on the left Babinski's downgoing.  Negative clonus Strength at foot  Plantar-flexion: 5/5 Dorsi-flexion: 3+/5 Eversion: 5/5 Inversion: 5/5  Leg strength  Quad: 5/5 Hamstring: 5/5 Hip flexor: 5/5 Hip abductors: 4/5       Impression and Recommendations:     This case required medical decision making of moderate complexity.

## 2014-03-10 NOTE — Patient Instructions (Signed)
Good to see you 2 injections today Starting tomorrow start prednionse 50 mg daily.  Ice 20 minutes 2 times daily. Usually after activity and before bed. See me again in 1 week.

## 2014-03-10 NOTE — Assessment & Plan Note (Signed)
Patient does have what appears to be more of a lumbar radiculopathy that seems to be affecting the S1 nerve root. Patient is having some weakness with some mild foot drop that is concerning. We discussed that this may need emergent imaging. Patient has elected to try the x-rays and conservative therapy first. Patient was given warning signs and when to seek medical attention. Patient was given 2 injections today and will do a prednisone burst. Patient and will come back in one week to make sure that he is improving. Patient has any worsening symptoms or bowel or bladder incontinence patient will call immediately or go to the emergency department. Otherwise will flee patient will be improving at follow-up.

## 2014-03-15 ENCOUNTER — Other Ambulatory Visit: Payer: Self-pay | Admitting: Internal Medicine

## 2014-03-17 ENCOUNTER — Encounter: Payer: Self-pay | Admitting: Family Medicine

## 2014-03-17 ENCOUNTER — Ambulatory Visit (INDEPENDENT_AMBULATORY_CARE_PROVIDER_SITE_OTHER): Payer: Medicare Other | Admitting: Family Medicine

## 2014-03-17 ENCOUNTER — Ambulatory Visit: Payer: Medicare Other | Admitting: Internal Medicine

## 2014-03-17 VITALS — BP 132/84 | HR 72 | Ht 69.0 in | Wt 201.0 lb

## 2014-03-17 DIAGNOSIS — M5416 Radiculopathy, lumbar region: Secondary | ICD-10-CM

## 2014-03-17 NOTE — Assessment & Plan Note (Signed)
Patient has made improvement on the prednisone. Patient is not having worsening pain at this time. Patient states that every day he seems to be getting better. We discussed the possibility of advanced imaging including MRI due to the still weakness with dorsiflexion of the foot. Patient declined at this time. We did get x-rays at last exam and x-rays do show an L4-L5 anterior slip. Am still concerned with patients foot drop. Patient is given another 72 hours but will give [place] a call at that time. If no significant improvement I would like to get an MRI. I do not think the patient likely had a stroke but that is within the differential. We may need to consider further workup patient otherwise will come back in 2 weeks.  Spent  25 minutes with patient face-to-face and had greater than 50% of counseling including as described above in assessment and plan.

## 2014-03-17 NOTE — Progress Notes (Signed)
  [name] [place] [place], [place] [address] Phone: [phone] Subjective:    CC:  Right leg pain  CLE:[id number] Keith [name] is a 74 y.o. male coming in with complaint of  Right leg pain. Patient states that this started approximately 2 weeks ago. Patient was seen last week and was concern for a herniated disc. Patient was given prednisone. Patient states since then the pain has a most completely resolved. Patient states that the radiation of pain down the leg seems to be better. Still having some foot drop but. States though that the numbness in the toes has improved but has not completely resolved as well. Discussed icing and home exercises. Patient has been doing this somewhat. Patient is able to ambulate fairly well. Denies any nighttime awakening.      Past medical history, social, surgical and family history all reviewed in electronic medical record.   Review of Systems: No headache, visual changes, nausea, vomiting, diarrhea, constipation, dizziness, abdominal pain, skin rash, fevers, chills, night sweats, weight loss, swollen lymph nodes, body aches, joint swelling, muscle aches, chest pain, shortness of breath, mood changes.   Objective Blood pressure 132/84, pulse 72, height 5\' 9"  (1.753 m), weight 201 lb (91.173 kg), SpO2 96 %.  General: No apparent distress alert and oriented x3 mood and affect normal, dressed appropriately.  HEENT: Pupils equal, extraocular movements intact  Respiratory: Patient's speak in full sentences and does not appear short of breath  Cardiovascular: No lower extremity edema, non tender, no erythema  Skin: Warm dry intact with no signs of infection or rash on extremities or on axial skeleton.  Abdomen: Soft nontender  Neuro: Cranial nerves II through XII are intact, neurovascularly intact in all extremities with 2+ DTRs and 2+ pulses.  Lymph: No lymphadenopathy of posterior or anterior cervical chain or axillae  bilaterally.  Gait  Mild antalgic gait MSK:  Non tender with full range of motion and good stability and symmetric strength and tone of shoulders, elbows, wrist, hip, knees bilaterally.  Back Exam:  Inspection: Unremarkable  Motion: Flexion 45 deg, Extension 30 deg, Side Bending to 45 deg bilaterally,  Rotation to 35 deg bilaterally  SLR laying: Negative XSLR laying: Negative  Palpable tenderness: Nontender on exam today FABER: Positive right Sensory change: Gross sensation intact to all lumbar and sacral dermatomes.  Reflexes: 2+ at both patellar tendons,  2+ Achilles tendon which is an improvement Babinski's downgoing.  Negative clonus Strength at foot  Plantar-flexion: 5/5 Dorsi-flexion: 3++/5 which is some mild improvement Eversion: 5/5 Inversion: 5/5  Leg strength  Quad: 5/5 Hamstring: 5/5 Hip flexor: 5/5 Hip abductors: 4/5       Impression and Recommendations:     This case required medical decision making of moderate complexity.

## 2014-03-17 NOTE — Patient Instructions (Signed)
Good to see you Ice is your friend to your back.  Continue to monitor the numbness in your toes or the foot drop is worse then it would better for me to get imaging.  Call on thursday and tell me how you are If anything worse then call me sooner.   Otherwise if doing well should see you in 2-3 weeks.

## 2014-03-24 ENCOUNTER — Telehealth: Payer: Self-pay | Admitting: Internal Medicine

## 2014-03-24 NOTE — Telephone Encounter (Signed)
Patient states he was to have a referral to a surgeon for a biopsy for a spot on his thumb.  Patient states the spot is now gone is requesting to have the referral cancelled.

## 2014-03-25 NOTE — Telephone Encounter (Signed)
Spoke w/Jessica @ [facility]. Referral canceled.

## 2014-04-04 ENCOUNTER — Other Ambulatory Visit: Payer: Self-pay

## 2014-04-04 NOTE — Telephone Encounter (Signed)
LVM for pt to call back.   RE: FLU VACCINE?

## 2014-04-10 ENCOUNTER — Encounter: Payer: Self-pay | Admitting: Family Medicine

## 2014-04-10 ENCOUNTER — Ambulatory Visit (INDEPENDENT_AMBULATORY_CARE_PROVIDER_SITE_OTHER): Payer: Medicare Other | Admitting: Family Medicine

## 2014-04-10 VITALS — BP 126/82 | HR 74 | Ht 69.0 in | Wt 199.0 lb

## 2014-04-10 DIAGNOSIS — M549 Dorsalgia, unspecified: Secondary | ICD-10-CM | POA: Diagnosis not present

## 2014-04-10 DIAGNOSIS — M5416 Radiculopathy, lumbar region: Secondary | ICD-10-CM | POA: Diagnosis not present

## 2014-04-10 MED ORDER — KETOROLAC TROMETHAMINE 60 MG/2ML IM SOLN
60.0000 mg | Freq: Once | INTRAMUSCULAR | Status: AC
  Administered 2014-04-10: 60 mg via INTRAMUSCULAR

## 2014-04-10 MED ORDER — METHYLPREDNISOLONE ACETATE 80 MG/ML IJ SUSP
80.0000 mg | Freq: Once | INTRAMUSCULAR | Status: AC
  Administered 2014-04-10: 80 mg via INTRAMUSCULAR

## 2014-04-10 NOTE — Progress Notes (Signed)
Pre visit review using our clinic review tool, if applicable. No additional management support is needed unless otherwise documented below in the visit note. 

## 2014-04-10 NOTE — Assessment & Plan Note (Signed)
Patient is responding very well to the anti-inflammatories as well as the steroids. Patient has elected to have another injection today to see if this would help the pain. We discussed the possibility of osteopathic manipulation which she declined. We also discussed the possibility of MRI for further evaluation to make sure there is no significant nerve root impingement. Patient feels that he is making some improvement and would like to hold off on the advance imaging. We discussed the patient's weakness or numbness comes back that he needs to go to the emergency department immediately or contact us. Patient then will come back and see me again in 2 weeks to make sure that this is completely resolved.  Spent  25 minutes with patient face-to-face and had greater than 50% of counseling including as described above in assessment and plan.

## 2014-04-10 NOTE — Progress Notes (Signed)
  [name] [address] [place], [place] [address] Phone: [phone] Subjective:    CC:  Follow-up on lumbar radiculopathy  DJM:[id number] Keith [name] is a 74 y.o. male coming in with complaint of  back pain. Patient was seen previously and was having radicular symptoms down the right leg and even had some mild foot drop. Patient was given steroids, and an injection. In addition of this patient was taking some anti-inflammatories as well as doing home exercise program. Patient states that the weakness as well as the radicular symptoms is completely resolved at this time. Patient though unfortunately continues to have back pain. Patient states that this back pain is so severe that it is stopping him from some activities. Patient is having some mild cramping in all of his joints from time to time but this seems to be intermittent. Denies any fevers or chills or any abnormal weight loss. Otherwise seems to be doing much better than last exam  Patient did have x-rays at last exam that did show the patient does have diffuse degenerative changes with progression of the L2-L3 disc space as well as L4-L5 6 mm anterior listhesis.     Past medical history, social, surgical and family history all reviewed in electronic medical record.   Review of Systems: No headache, visual changes, nausea, vomiting, diarrhea, constipation, dizziness, abdominal pain, skin rash, fevers, chills, night sweats, weight loss, swollen lymph nodes, body aches, joint swelling, muscle aches, chest pain, shortness of breath, mood changes.   Objective Blood pressure 126/82, pulse 74, height 5\' 9"  (1.753 m), weight 199 lb (90.266 kg), SpO2 98 %.  General: No apparent distress alert and oriented x3 mood and affect normal, dressed appropriately.  HEENT: Pupils equal, extraocular movements intact  Respiratory: Patient's speak in full sentences and does not appear short of breath  Cardiovascular: No  lower extremity edema, non tender, no erythema  Skin: Warm dry intact with no signs of infection or rash on extremities or on axial skeleton.  Abdomen: Soft nontender  Neuro: Cranial nerves II through XII are intact, neurovascularly intact in all extremities with 2+ DTRs and 2+ pulses.  Lymph: No lymphadenopathy of posterior or anterior cervical chain or axillae bilaterally.  Gait  Mild antalgic gait MSK:  Non tender with full range of motion and good stability and symmetric strength and tone of shoulders, elbows, wrist, hip, knees bilaterally.  Back Exam:  Inspection: Unremarkable  Motion: Flexion 45 deg, Extension 30 deg, Side Bending to 45 deg bilaterally,  Rotation to 35 deg bilaterally  SLR laying: Negative XSLR laying: Negative  Palpable tenderness: Nontender on exam today FABER: Positive right Sensory change: Gross sensation intact to all lumbar and sacral dermatomes.  Reflexes: 2+ at both patellar tendons,  2+ Achilles tendon which and symmetric Babinski's downgoing.  Negative clonus Strength at foot  Plantar-flexion: 5/5 Dorsi-flexion: 4+/5 which is some mild improvement Eversion: 5/5 Inversion: 5/5  Leg strength  Quad: 5/5 Hamstring: 5/5 Hip flexor: 5/5 Hip abductors: 4/5       Impression and Recommendations:     This case required medical decision making of moderate complexity.

## 2014-04-10 NOTE — Patient Instructions (Signed)
Good to see you Heat before activity and ice after activity.  Compression sleeve to the calf Continue the exercises  2 shots today Iron 325 mg daily or only 3 times a week if worsening constipation.  See me agian in 2 weeks.  If numbness or weakness happens again see me or got ER immediatly.

## 2014-04-28 ENCOUNTER — Encounter: Payer: Self-pay | Admitting: Family Medicine

## 2014-04-28 ENCOUNTER — Ambulatory Visit (INDEPENDENT_AMBULATORY_CARE_PROVIDER_SITE_OTHER): Payer: Medicare Other | Admitting: Family Medicine

## 2014-04-28 VITALS — BP 120/82 | HR 64 | Ht 69.0 in | Wt 197.0 lb

## 2014-04-28 DIAGNOSIS — M7732 Calcaneal spur, left foot: Secondary | ICD-10-CM

## 2014-04-28 DIAGNOSIS — M5416 Radiculopathy, lumbar region: Secondary | ICD-10-CM

## 2014-04-28 MED ORDER — GABAPENTIN 100 MG PO CAPS
100.0000 mg | ORAL_CAPSULE | Freq: Every day | ORAL | Status: DC
Start: 2014-04-28 — End: 2014-12-24

## 2014-04-28 NOTE — Progress Notes (Signed)
[name] [name], [place] [address] Phone: [phone] Subjective:    CC:  Follow-up on lumbar radiculopathy  [id number] Keith [name] is a 74 y.o. male coming in with complaint of  back pain. Patient was seen previously and was having radicular symptoms down the right leg and even had some mild foot drop. Patient was given steroids, and an injection. In addition of this patient was taking some anti-inflammatories as well as doing home exercise program. Patient states that his back pain has improved. Patient states that everything has improved except when he is doing a lot of walking he has difficulty with the foot drop still. No weakness that he has noticed and no numbness.   Patient also has a calcaneal spur of the left foot. Patient states that this seems to be affecting him as well. He may be compensating somewhat. Patient did have x-rays at last exam that did show the patient does have diffuse degenerative changes with progression of the L2-L3 disc space as well as L4-L5 6 mm anterior listhesis.     Past medical history, social, surgical and family history all reviewed in electronic medical record.   Review of Systems: No headache, visual changes, nausea, vomiting, diarrhea, constipation, dizziness, abdominal pain, skin rash, fevers, chills, night sweats, weight loss, swollen lymph nodes, body aches, joint swelling, muscle aches, chest pain, shortness of breath, mood changes.   Objective Blood pressure 120/82, pulse 64, height 5\' 9"  (1.753 m), weight 197 lb (89.359 kg).  General: No apparent distress alert and oriented x3 mood and affect normal, dressed appropriately.  HEENT: Pupils equal, extraocular movements intact  Respiratory: Patient's speak in full sentences and does not appear short of breath  Cardiovascular: No lower extremity edema, non tender, no erythema  Skin: Warm dry intact with no signs of infection or rash on  extremities or on axial skeleton.  Abdomen: Soft nontender  Neuro: Cranial nerves II through XII are intact, neurovascularly intact in all extremities with 2+ DTRs and 2+ pulses.  Lymph: No lymphadenopathy of posterior or anterior cervical chain or axillae bilaterally.  Gait  Mild antalgic gait MSK:  Non tender with full range of motion and good stability and symmetric strength and tone of shoulders, elbows, wrist, hip, knees bilaterally.  Back Exam:  Inspection: Unremarkable  Motion: Flexion 45 deg, Extension 30 deg, Side Bending to 45 deg bilaterally,  Rotation to 35 deg bilaterally  SLR laying: Negative XSLR laying: Negative  Palpable tenderness: Nontender on exam today FABER: Positive right Sensory change: Gross sensation intact to all lumbar and sacral dermatomes.  Reflexes: 2+ at both patellar tendons,  2+ Achilles tendon which and symmetric Babinski's downgoing.  Negative clonus Strength at foot  Plantar-flexion: 5/5 Dorsi-flexion: 4+/5 with no significant change from previous exam Eversion: 5/5 Inversion: 5/5  Leg strength  Quad: 5/5 Hamstring: 5/5 Hip flexor: 5/5 Hip abductors: 4/5   Left heel does have some mild pain as well. Pain seems to be mostly over the medial calcaneal area. No numbness noted.  Procedure: Real-time Ultrasound Guided Injection of left plantar fascial and calcaneal heel spur Device: GE Logiq E  Ultrasound guided injection is preferred based studies that show increased duration, increased effect, greater accuracy, decreased procedural pain, increased response rate, and decreased cost with ultrasound guided versus blind injection.  Verbal informed consent obtained.  Time-out conducted.  Noted no overlying erythema, induration, or other signs of local infection.  Skin prepped in a sterile fashion.  Local anesthesia: Topical Ethyl chloride.  With sterile technique and under real time ultrasound guidance:  25-gauge 1 inch needle patient was injected with 0.5  mL of 0.5% Marcaine and 0.5 mL of Kenalog 40 mg/dL at the calcaneal heel spur. Completed without difficulty  Pain immediately resolved suggesting accurate placement of the medication.  Advised to call if fevers/chills, erythema, induration, drainage, or persistent bleeding.  Images permanently stored and available for review in the ultrasound unit.  Impression: Technically successful ultrasound guided injection.    Impression and Recommendations:     This case required medical decision making of moderate complexity.

## 2014-04-28 NOTE — Patient Instructions (Signed)
Good to see you I am glad your back is doing better Continue the good shoes and the  If the foot drop gets worse we have to look at your back especially weakness or numbness and we will get MRI, just call.  We injected to foot today to see if it could help Continue the heel lift See me again in 4 weeks.

## 2014-04-28 NOTE — Assessment & Plan Note (Signed)
Discussed with patient again. Patient continues to have some weakness of the dorsiflexion on the right foot compared to the contralateral side. Patient has no upper motor neuron signs in patient still has 2+ reflexes. Patient does have good strength can do daily activities. We discussed again with patient at great length though that if symptoms seem to worsen at any point or any chronic numbness occurs we do need advance imaging the patient's back to further evaluate. Patient is fairly adamant he would not want any intervention such as steroid injections or potentially surgery at this time anyhow. Patient will continue to monitor closely and will call if anything changes. I would like patient to follow-up again in 4 weeks to make sure that patient continues to do okay. Patient given gabapentin to take at night.

## 2014-04-28 NOTE — Assessment & Plan Note (Signed)
Patient was given an injection today and tolerated the procedure very well. We discussed icing regimen and home exercises. We discussed heels per and patient is wearing a heel lift at this time. We discussed the possibility of custom orthotics and patient declined. Patient will follow-up with me again in 4 weeks to make sure he is responding well.

## 2014-04-28 NOTE — Progress Notes (Signed)
Pre visit review using our clinic review tool, if applicable. No additional management support is needed unless otherwise documented below in the visit note. 

## 2014-05-05 ENCOUNTER — Other Ambulatory Visit: Payer: Self-pay | Admitting: *Deleted

## 2014-05-05 DIAGNOSIS — M79672 Pain in left foot: Secondary | ICD-10-CM

## 2014-05-06 ENCOUNTER — Ambulatory Visit (INDEPENDENT_AMBULATORY_CARE_PROVIDER_SITE_OTHER): Payer: Medicare Other | Admitting: Family Medicine

## 2014-05-06 ENCOUNTER — Encounter: Payer: Self-pay | Admitting: Family Medicine

## 2014-05-06 ENCOUNTER — Other Ambulatory Visit (INDEPENDENT_AMBULATORY_CARE_PROVIDER_SITE_OTHER): Payer: Medicare Other

## 2014-05-06 ENCOUNTER — Ambulatory Visit (INDEPENDENT_AMBULATORY_CARE_PROVIDER_SITE_OTHER)
Admission: RE | Admit: 2014-05-06 | Discharge: 2014-05-06 | Disposition: A | Discharge status: Home / Self Care | Payer: Medicare Other | Source: Ambulatory Visit | Attending: Family Medicine | Admitting: Family Medicine

## 2014-05-06 VITALS — BP 130/84 | HR 70 | Ht 69.0 in | Wt 197.0 lb

## 2014-05-06 DIAGNOSIS — M79672 Pain in left foot: Secondary | ICD-10-CM

## 2014-05-06 DIAGNOSIS — R29898 Other symptoms and signs involving the musculoskeletal system: Secondary | ICD-10-CM | POA: Diagnosis not present

## 2014-05-06 DIAGNOSIS — M7732 Calcaneal spur, left foot: Secondary | ICD-10-CM | POA: Diagnosis not present

## 2014-05-06 DIAGNOSIS — M5416 Radiculopathy, lumbar region: Secondary | ICD-10-CM | POA: Diagnosis not present

## 2014-05-06 DIAGNOSIS — M19072 Primary osteoarthritis, left ankle and foot: Secondary | ICD-10-CM | POA: Diagnosis not present

## 2014-05-06 LAB — URIC ACID: Uric Acid, Serum: 5.8 mg/dL (ref 4.0–7.8)

## 2014-05-06 MED ORDER — PREDNISONE 50 MG PO TABS: 50.0000 mg | ORAL_TABLET | Freq: Every day | ORAL | Status: DC

## 2014-05-06 NOTE — Assessment & Plan Note (Signed)
Concern the patient is having worsening radicular symptoms even though the pain is not severe. Patient though is having significant weakness of the lower extremity which I'm more concerned about. Urgent advance imaging of MRI of the lumbar spine is necessary. This was ordered today. We'll put patient on gabapentin as well as the prednisone to see if we can help. We discussed if any significant severe weakness seen occurs patient needs to go to the emergency department immediately. Depending on findings of the MRI we will either consider epidural steroid injections or surgical intervention. We will discuss as soon as this is known.

## 2014-05-06 NOTE — Patient Instructions (Addendum)
Good to see you Get labs downstairs today.  Prednisone daily for 5 days  Ice bath at night Wear the new brace while walking.  Your back , your leg has too much weakness and we need an MRI.  See me again in 2 weeks.

## 2014-05-06 NOTE — Progress Notes (Signed)
Pre visit review using our clinic review tool, if applicable. No additional management support is needed unless otherwise documented below in the visit note. 

## 2014-05-06 NOTE — Progress Notes (Signed)
  [name] [place], [place] [address] Phone: [phone] Subjective:    CC:  Follow-up on lumbar radiculopathy, follow up heel pain.   [id number] Keith [name] is a 74 y.o. male coming in with complaint of  back pain. Patient states that the radicular symptoms down the leg has completely resolved the patient has noticed weakness. Patient states that if he does a squat is unable to actually lift his body up with that leg. Patient states that this seems to be worsening. Denies any bowel or bladder incontinence. . Patient did have x-rays at last exam that did show the patient does have diffuse degenerative changes with progression of the L2-L3 disc space as well as L4-L5 6 mm anterior listhesis. She was also seen for a calcaneal heel spur. Patient did have x-rays and these were reviewed by me. Patient's x-rays show that patient actually has an erosion of the fifth metatarsal but slowly a very mild calcaneal heel spur noted. Patient also had significant swelling of the soft tissue of the plantar aspect of the foot. This was seen on ultrasound. Patient was given an injection and states that it only helped for about 6 hours.    Past medical history, social, surgical and family history all reviewed in electronic medical record.   Review of Systems: No headache, visual changes, nausea, vomiting, diarrhea, constipation, dizziness, abdominal pain, skin rash, fevers, chills, night sweats, weight loss, swollen lymph nodes, body aches, joint swelling, muscle aches, chest pain, shortness of breath, mood changes.   Objective Blood pressure 130/84, pulse 70, height 5\' 9"  (1.753 m), weight 197 lb (89.359 kg), SpO2 97 %.  General: No apparent distress alert and oriented x3 mood and affect normal, dressed appropriately.  HEENT: Pupils equal, extraocular movements intact  Respiratory: Patient's speak in full sentences and does not appear short of breath    Cardiovascular: No lower extremity edema, non tender, no erythema  Skin: Warm dry intact with no signs of infection or rash on extremities or on axial skeleton.  Abdomen: Soft nontender  Neuro: Cranial nerves II through XII are intact, neurovascularly intact in all extremities but only has +1 Achilles reflex on the right compared to the contralateral side Lymph: No lymphadenopathy of posterior or anterior cervical chain or axillae bilaterally.  Gait  Mild antalgic gait MSK:  Non tender with full range of motion and good stability and symmetric strength and tone of shoulders, elbows, wrist, hip, knees bilaterally.  Back Exam:  Inspection: Unremarkable  Motion: Flexion 45 deg, Extension 30 deg, Side Bending to 45 deg bilaterally,  Rotation to 35 deg bilaterally  SLR laying: Moderately positive on right which is a new symptom XSLR laying: Negative  Palpable tenderness: Nontender on exam today FABER: Positive right Sensory change: Gross sensation intact to all lumbar and sacral dermatomes.  Reflexes: 2+ at both patellar tendons,  1+ Achilles tendon on right compared to 2+ on left which and symmetric Babinski's downgoing.  Negative clonus Strength at foot  Plantar-flexion: 4/5 Dorsi-flexion: 4/5 with increasing weakness Eversion: 5/5 Inversion: 5/5  Leg strength  Quad: 5/5 Hamstring: 5/5 Hip flexor: 4/5 on right compared to 5 out of 5 on the left Hip abductors: 4/5   Continues to be tender to palpation over the medial calcaneal heel.     Impression and Recommendations:     This case required medical decision making of moderate complexity.

## 2014-05-06 NOTE — Assessment & Plan Note (Signed)
Patient did not respond well to the injection and continues to have some mild swelling of the fat pad. Patient will be on prednisone daily for 5 days which hopefully will also help his back. We are going to kind of monitor this closely. We discussed taking the over-the-counter orthotics out of his shoes which seem to be hard. Patient is also started on gabapentin. We discussed icing regimen. Patient and will come back again in 7-10 days. Advance imaging may be necessary. I'm concerned that actually patient is putting more weight on this side due to his weakness on the right side.

## 2014-05-08 ENCOUNTER — Telehealth: Payer: Self-pay | Admitting: Family Medicine

## 2014-05-08 NOTE — Telephone Encounter (Signed)
Tell him normal, not gout.

## 2014-05-08 NOTE — Telephone Encounter (Signed)
Patient call wanting to know the results of his lab work

## 2014-05-08 NOTE — Telephone Encounter (Signed)
Discussed with pt

## 2014-05-09 ENCOUNTER — Ambulatory Visit (INDEPENDENT_AMBULATORY_CARE_PROVIDER_SITE_OTHER)
Admission: RE | Admit: 2014-05-09 | Discharge: 2014-05-09 | Disposition: A | Discharge status: Home / Self Care | Payer: Medicare Other | Source: Ambulatory Visit | Attending: Internal Medicine | Admitting: Internal Medicine

## 2014-05-09 ENCOUNTER — Encounter: Payer: Self-pay | Admitting: Internal Medicine

## 2014-05-09 ENCOUNTER — Other Ambulatory Visit (INDEPENDENT_AMBULATORY_CARE_PROVIDER_SITE_OTHER): Payer: Medicare Other

## 2014-05-09 ENCOUNTER — Ambulatory Visit (INDEPENDENT_AMBULATORY_CARE_PROVIDER_SITE_OTHER): Payer: Medicare Other | Admitting: Internal Medicine

## 2014-05-09 VITALS — BP 140/90 | HR 86 | Temp 98.1°F | Wt 199.0 lb

## 2014-05-09 DIAGNOSIS — G8929 Other chronic pain: Secondary | ICD-10-CM

## 2014-05-09 DIAGNOSIS — E038 Other specified hypothyroidism: Secondary | ICD-10-CM

## 2014-05-09 DIAGNOSIS — Q6100 Congenital renal cyst, unspecified: Secondary | ICD-10-CM

## 2014-05-09 DIAGNOSIS — R109 Unspecified abdominal pain: Secondary | ICD-10-CM

## 2014-05-09 DIAGNOSIS — R079 Chest pain, unspecified: Secondary | ICD-10-CM | POA: Diagnosis not present

## 2014-05-09 DIAGNOSIS — R1010 Upper abdominal pain, unspecified: Secondary | ICD-10-CM | POA: Diagnosis not present

## 2014-05-09 DIAGNOSIS — N281 Cyst of kidney, acquired: Secondary | ICD-10-CM

## 2014-05-09 LAB — CBC WITH DIFFERENTIAL/PLATELET
BASOS PCT: 0 % (ref 0.0–3.0)
Basophils Absolute: 0 10*3/uL (ref 0.0–0.1)
EOS PCT: 0 % (ref 0.0–5.0)
Eosinophils Absolute: 0 10*3/uL (ref 0.0–0.7)
HCT: 45.1 % (ref 39.0–52.0)
HEMOGLOBIN: 15.3 g/dL (ref 13.0–17.0)
LYMPHS ABS: 0.9 10*3/uL (ref 0.7–4.0)
Lymphocytes Relative: 7.2 % — ABNORMAL LOW (ref 12.0–46.0)
MCHC: 33.9 g/dL (ref 30.0–36.0)
MCV: 89.1 fl (ref 78.0–100.0)
MONO ABS: 0.2 10*3/uL (ref 0.1–1.0)
Monocytes Relative: 1.3 % — ABNORMAL LOW (ref 3.0–12.0)
NEUTROS ABS: 11 10*3/uL — AB (ref 1.4–7.7)
Neutrophils Relative %: 91.5 % — ABNORMAL HIGH (ref 43.0–77.0)
Platelets: 205 10*3/uL (ref 150.0–400.0)
RBC: 5.06 Mil/uL (ref 4.22–5.81)
RDW: 14.6 % (ref 11.5–15.5)
WBC: 12 10*3/uL — ABNORMAL HIGH (ref 4.0–10.5)

## 2014-05-09 LAB — COMPREHENSIVE METABOLIC PANEL
ALK PHOS: 51 U/L (ref 39–117)
ALT: 19 U/L (ref 0–53)
AST: 18 U/L (ref 0–37)
Albumin: 3.8 g/dL (ref 3.5–5.2)
BILIRUBIN TOTAL: 0.6 mg/dL (ref 0.2–1.2)
BUN: 22 mg/dL (ref 6–23)
CALCIUM: 9.1 mg/dL (ref 8.4–10.5)
CHLORIDE: 103 meq/L (ref 96–112)
CO2: 28 mEq/L (ref 19–32)
CREATININE: 1.04 mg/dL (ref 0.40–1.50)
GFR: 74.48 mL/min (ref >60.00)
Glucose, Bld: 177 mg/dL — ABNORMAL HIGH (ref 70–99)
Potassium: 4.2 mEq/L (ref 3.5–5.1)
Sodium: 136 mEq/L (ref 135–145)
Total Protein: 6.6 g/dL (ref 6.0–8.3)

## 2014-05-09 LAB — URINALYSIS, ROUTINE W REFLEX MICROSCOPIC
BILIRUBIN URINE: NEGATIVE
KETONES UR: NEGATIVE
LEUKOCYTES UA: NEGATIVE
NITRITE: NEGATIVE
Specific Gravity, Urine: 1.015 (ref 1.000–1.030)
Total Protein, Urine: NEGATIVE
URINE GLUCOSE: NEGATIVE
UROBILINOGEN UA: 0.2 (ref 0.0–1.0)
pH: 5.5 (ref 5.0–8.0)

## 2014-05-09 LAB — TSH: TSH: 0.12 u[IU]/mL — AB (ref 0.35–4.50)

## 2014-05-09 MED ORDER — MELOXICAM 15 MG PO TABS: 15.0000 mg | ORAL_TABLET | Freq: Every day | ORAL | Status: DC

## 2014-05-09 NOTE — Patient Instructions (Signed)
Flank Pain °Flank pain refers to pain that is located on the side of the body between the upper abdomen and the back. The pain may occur over a short period of time (acute) or may be long-term or reoccurring (chronic). It may be mild or severe. Flank pain can be caused by many things. °CAUSES  °Some of the more common causes of flank pain include: °· Muscle strains.   °· Muscle spasms.   °· A disease of your spine (vertebral disk disease).   °· A lung infection (pneumonia).   °· Fluid around your lungs (pulmonary edema).   °· A kidney infection.   °· Kidney stones.   °· A very painful skin rash caused by the chickenpox virus (shingles).   °· Gallbladder disease.   °HOME CARE INSTRUCTIONS  °Home care will depend on the cause of your pain. In general, °· Rest as directed by your caregiver. °· Drink enough fluids to keep your urine clear or pale yellow. °· Only take over-the-counter or prescription medicines as directed by your caregiver. Some medicines may help relieve the pain. °· Tell your caregiver about any changes in your pain. °· Follow up with your caregiver as directed. °SEEK IMMEDIATE MEDICAL CARE IF:  °· Your pain is not controlled with medicine.   °· You have new or worsening symptoms. °· Your pain increases.   °· You have abdominal pain.   °· You have shortness of breath.   °· You have persistent nausea or vomiting.   °· You have swelling in your abdomen.   °· You feel faint or pass out.   °· You have blood in your urine. °· You have a fever or persistent symptoms for more than 2-3 days. °· You have a fever and your symptoms suddenly get worse. °MAKE SURE YOU:  °· Understand these instructions. °· Will watch your condition. °· Will get help right away if you are not doing well or get worse. °Document Released: 03/17/2005 Document Revised: 10/19/2011 Document Reviewed: 09/08/2011 °[employer]® Patient Information ©2015 [employer]. This information is not intended to replace advice given to you by your  health care provider. Make sure you discuss any questions you have with your health care provider. ° °

## 2014-05-09 NOTE — Progress Notes (Signed)
Pre visit review using our clinic review tool, if applicable. No additional management support is needed unless otherwise documented below in the visit note. 

## 2014-05-11 DIAGNOSIS — N281 Cyst of kidney, acquired: Secondary | ICD-10-CM | POA: Insufficient documentation

## 2014-05-11 MED ORDER — LEVOTHYROXINE SODIUM 125 MCG PO TABS: 125.0000 ug | ORAL_TABLET | Freq: Every day | ORAL | Status: DC

## 2014-05-11 NOTE — Assessment & Plan Note (Signed)
WBC ct is elevated consistent with steroid intake - I don't think there is an infection. His plain xray is WNL, UA shows a small amount of blood and he has a history of renal cysts - will get an updated U/S done to see if there has been a change in the appearance of the cysts. His pain is not concerning for a renal stone.

## 2014-05-11 NOTE — Assessment & Plan Note (Signed)
His TSh is slightly suppressed so I have lowered his synthroid dose

## 2014-05-11 NOTE — Progress Notes (Signed)
Subjective:    Patient ID: Keith [name], male    DOB: [birth year], 74 y.o.   MRN: [id number]  Flank Pain This is a recurrent problem. Episode onset: several years ago, this episode for 6 days. The problem occurs intermittently. The problem is unchanged. Pain location: right flank, diffuse area. The quality of the pain is described as aching. The pain does not radiate. The pain is at a severity of 2/10. The pain is mild. The pain is worse during the day. The symptoms are aggravated by twisting and position. Pertinent negatives include no abdominal pain, bladder incontinence, bowel incontinence, chest pain, dysuria, fever, headaches, leg pain, numbness, paresis, paresthesias, pelvic pain, perianal numbness, tingling, weakness or weight loss. Risk factors include recent trauma (trimming a tree - may have pulled something). He has tried nothing for the symptoms.      Review of Systems  Constitutional: Negative.  Negative for fever, chills, weight loss, diaphoresis, appetite change and fatigue.  HENT: Negative.   Eyes: Negative.   Respiratory: Negative.   Cardiovascular: Negative.  Negative for chest pain, palpitations and leg swelling.  Gastrointestinal: Negative.  Negative for nausea, vomiting, abdominal pain, diarrhea, constipation, blood in stool and bowel incontinence.  Endocrine: Negative.   Genitourinary: Positive for flank pain. Negative for bladder incontinence, dysuria, urgency, frequency, hematuria, decreased urine volume, discharge, penile swelling, scrotal swelling, enuresis, difficulty urinating, genital sores, penile pain, testicular pain and pelvic pain.  Musculoskeletal: Negative.  Negative for myalgias, back pain, gait problem and neck pain.  Skin: Negative.  Negative for rash.  Allergic/Immunologic: Negative.   Neurological: Negative.  Negative for tingling, weakness, numbness, headaches and paresthesias.  Hematological: Negative.  Negative for adenopathy. Does not bruise/bleed  easily.  Psychiatric/Behavioral: Negative.        Objective:   Physical Exam  Constitutional: He is oriented to person, place, and time. He appears well-developed and well-nourished.  Non-toxic appearance. He does not have a sickly appearance. He does not appear ill. No distress.  HENT:  Head: Normocephalic and atraumatic.  Mouth/Throat: Oropharynx is clear and moist. No oropharyngeal exudate.  Eyes: Conjunctivae are normal. Right eye exhibits no discharge. Left eye exhibits no discharge. No scleral icterus.  Neck: Normal range of motion. Neck supple. No JVD present. No tracheal deviation present. No thyromegaly present.  Cardiovascular: Normal rate, regular rhythm, normal heart sounds and intact distal pulses.  Exam reveals no gallop and no friction rub.   No murmur heard. Pulmonary/Chest: Effort normal and breath sounds normal. No stridor. No respiratory distress. He has no decreased breath sounds. He has no wheezes. He has no rhonchi. He has no rales. He exhibits no mass, no tenderness, no bony tenderness, no crepitus, no edema, no deformity, no swelling and no retraction.  Abdominal: Soft. Normal appearance and bowel sounds are normal. He exhibits no distension and no mass. There is no hepatosplenomegaly, splenomegaly or hepatomegaly. There is no tenderness. There is no rigidity, no rebound, no guarding, no CVA tenderness, no tenderness at McBurney's point and negative Murphy's sign.  Musculoskeletal: Normal range of motion. He exhibits no edema or tenderness.  Lymphadenopathy:    He has no cervical adenopathy.  Neurological: He is oriented to person, place, and time.  Skin: Skin is warm and dry. No rash noted. He is not diaphoretic. No erythema. No pallor.  Vitals reviewed.   Lab Results  Component Value Date   WBC 12.0* 05/09/2014   HGB 15.3 05/09/2014   HCT 45.1 05/09/2014   PLT 205.0  05/09/2014   GLUCOSE 177* 05/09/2014   CHOL 172 03/07/2014   TRIG 145.0 03/07/2014   HDL  40.50 03/07/2014   LDLCALC 103* 03/07/2014   ALT 19 05/09/2014   AST 18 05/09/2014   NA 136 05/09/2014   K 4.2 05/09/2014   CL 103 05/09/2014   CREATININE 1.04 05/09/2014   BUN 22 05/09/2014   CO2 28 05/09/2014   TSH 0.12* 05/09/2014   PSA 3.82 03/07/2014        Assessment & Plan:

## 2014-05-12 ENCOUNTER — Encounter: Payer: Self-pay | Admitting: Internal Medicine

## 2014-05-14 ENCOUNTER — Encounter: Payer: Self-pay | Admitting: Internal Medicine

## 2014-05-14 ENCOUNTER — Ambulatory Visit
Admission: RE | Admit: 2014-05-14 | Discharge: 2014-05-14 | Disposition: A | Discharge status: Home / Self Care | Payer: Medicare Other | Source: Ambulatory Visit | Attending: Internal Medicine | Admitting: Internal Medicine

## 2014-05-14 DIAGNOSIS — R109 Unspecified abdominal pain: Principal | ICD-10-CM

## 2014-05-14 DIAGNOSIS — N400 Enlarged prostate without lower urinary tract symptoms: Secondary | ICD-10-CM | POA: Diagnosis not present

## 2014-05-14 DIAGNOSIS — N281 Cyst of kidney, acquired: Secondary | ICD-10-CM | POA: Diagnosis not present

## 2014-05-14 DIAGNOSIS — G8929 Other chronic pain: Secondary | ICD-10-CM

## 2014-05-17 ENCOUNTER — Ambulatory Visit
Admission: RE | Admit: 2014-05-17 | Discharge: 2014-05-17 | Disposition: A | Discharge status: Home / Self Care | Payer: Medicare Other | Source: Ambulatory Visit | Attending: Family Medicine | Admitting: Family Medicine

## 2014-05-17 DIAGNOSIS — R29898 Other symptoms and signs involving the musculoskeletal system: Secondary | ICD-10-CM

## 2014-05-17 DIAGNOSIS — M5126 Other intervertebral disc displacement, lumbar region: Secondary | ICD-10-CM | POA: Diagnosis not present

## 2014-05-17 DIAGNOSIS — M4806 Spinal stenosis, lumbar region: Secondary | ICD-10-CM | POA: Diagnosis not present

## 2014-05-17 DIAGNOSIS — M4316 Spondylolisthesis, lumbar region: Secondary | ICD-10-CM | POA: Diagnosis not present

## 2014-05-17 DIAGNOSIS — M5136 Other intervertebral disc degeneration, lumbar region: Secondary | ICD-10-CM | POA: Diagnosis not present

## 2014-05-27 ENCOUNTER — Ambulatory Visit (INDEPENDENT_AMBULATORY_CARE_PROVIDER_SITE_OTHER): Payer: Medicare Other | Admitting: Family Medicine

## 2014-05-27 ENCOUNTER — Encounter: Payer: Self-pay | Admitting: Family Medicine

## 2014-05-27 VITALS — BP 132/84 | HR 77 | Ht 69.0 in | Wt 199.0 lb

## 2014-05-27 DIAGNOSIS — M5416 Radiculopathy, lumbar region: Secondary | ICD-10-CM

## 2014-05-27 DIAGNOSIS — M21371 Foot drop, right foot: Secondary | ICD-10-CM

## 2014-05-27 NOTE — Assessment & Plan Note (Signed)
Patient does have lumbar radiculopathy in patient's MRI is consistent with patient's finding and foot drop. Patient does have some mild weakening of the patellar tendon reflexes well today. This is a new finding. Weakness of the foot drop is no more severe than it was previously. I do believe that patient's needs an epidural to further delineate where this impingement is. Patient is going to be scheduled for this as well as referred to neurosurgery. I think that patient will likely need surgical intervention but we would like to isolate where this will be needed. Patient will call me if any questions or any worsening pain. Unable to go up on the gabapentin secondary to patient somnolence. Patientcall and we will discuss other pain medications if needed. Has been responding well to anti-inflammatories. Patient will check in with me after the epidural to tell me if he improved and we will consider facet injection as well significant foraminal stenosis noted.  Spent  25 minutes with patient face-to-face and had greater than 50% of counseling including as described above in assessment and plan.

## 2014-05-27 NOTE — Progress Notes (Signed)
  [name] [place], [place] [address] Phone: [phone] Subjective:    CC:  Follow-up on lumbar radiculopathy, follow up heel pain.   [id number] Keith [name] is a 74 y.o. male coming in with complaint of  back pain. Patient states that the radicular symptoms down the leg has completely resolved the patient has noticed weakness. Patient states that if he does a squat is unable to actually lift his body up with that leg. Patient states that this seems to be worsening. Denies any bowel or bladder incontinence. . Patient did have x-rays at last exam that did show the patient does have diffuse degenerative changes with progression of the L2-L3 disc space as well as L4-L5 6 mm anterior listhesis. Patient continued to have difficulty. Patient was sent for an MRI that shows L4-L5 disc degeneration with stenosis as well as right foraminal stenosis and right subarticular stenosis with significant impingement on the L5 descending nerves.   heel pain is a most already resolved.  Past medical history, social, surgical and family history all reviewed in electronic medical record.   Review of Systems: No headache, visual changes, nausea, vomiting, diarrhea, constipation, dizziness, abdominal pain, skin rash, fevers, chills, night sweats, weight loss, swollen lymph nodes, body aches, joint swelling, muscle aches, chest pain, shortness of breath, mood changes.   Objective Blood pressure 132/84, pulse 77, height 5\' 9"  (1.753 m), weight 199 lb (90.266 kg), SpO2 97 %.  General: No apparent distress alert and oriented x3 mood and affect normal, dressed appropriately.  HEENT: Pupils equal, extraocular movements intact  Respiratory: Patient's speak in full sentences and does not appear short of breath  Cardiovascular: No lower extremity edema, non tender, no erythema  Skin: Warm dry intact with no signs of infection or rash on extremities or on axial skeleton.    Abdomen: Soft nontender  Neuro: Cranial nerves II through XII are intact, neurovascularly intact in all extremities but only has +1 Achilles reflex on the right compared to the contralateral side Lymph: No lymphadenopathy of posterior or anterior cervical chain or axillae bilaterally.  Gait  Mild antalgic gait MSK:  Non tender with full range of motion and good stability and symmetric strength and tone of shoulders, elbows, wrist, hip, knees bilaterally.  Back Exam:  Inspection: Unremarkable  Motion: Flexion 45 deg, Extension 30 deg, Side Bending to 45 deg bilaterally,  Rotation to 35 deg bilaterally  SLR laying: Continued discomfort on right side XSLR laying: Negative  Palpable tenderness: Nontender on exam today FABER: Positive right Sensory change: Gross sensation intact to all lumbar and sacral dermatomes.  Reflexes: 1+ on right compared to 2+ on left patellar tendons,  1+ Achilles tendon on right compared to 2+ on left which and symmetric Babinski's downgoing.  Negative clonus Strength at foot  Plantar-flexion: 4/5 Dorsi-flexion: 4/5 with continued weakness but no worsening from previous exam Eversion: 5/5 Inversion: 5/5  Leg strength  Quad: 5/5 Hamstring: 5/5 Hip flexor: 4/5 on right compared to 5 out of 5 on the left Hip abductors: 4/5   Nontender over the medial calcaneal heel. Reviewed MRI with patient today in great detail   Impression and Recommendations:     This case required medical decision making of moderate complexity.

## 2014-05-27 NOTE — Progress Notes (Signed)
Pre visit review using our clinic review tool, if applicable. No additional management support is needed unless otherwise documented below in the visit note. 

## 2014-05-27 NOTE — Patient Instructions (Addendum)
Good to see you I am glad the heel is better We will get epidural injection I will start the process to get you in with neurosurgery. They will call you as well Call me about 3-7 days after epidural and tell me how you are doing.  If any worsening symptoms call as well. I am here for any questions.

## 2014-06-05 ENCOUNTER — Ambulatory Visit
Admission: RE | Admit: 2014-06-05 | Discharge: 2014-06-05 | Disposition: A | Discharge status: Home / Self Care | Payer: Medicare Other | Source: Ambulatory Visit | Attending: Family Medicine | Admitting: Family Medicine

## 2014-06-05 DIAGNOSIS — M5416 Radiculopathy, lumbar region: Secondary | ICD-10-CM

## 2014-06-05 DIAGNOSIS — M4316 Spondylolisthesis, lumbar region: Secondary | ICD-10-CM | POA: Diagnosis not present

## 2014-06-05 MED ORDER — IOHEXOL 180 MG/ML  SOLN
1.0000 mL | Freq: Once | INTRAMUSCULAR | Status: AC | PRN
  Administered 2014-06-05: 1 mL via EPIDURAL

## 2014-06-05 MED ORDER — METHYLPREDNISOLONE ACETATE 40 MG/ML INJ SUSP (RADIOLOG
120.0000 mg | Freq: Once | INTRAMUSCULAR | Status: AC
  Administered 2014-06-05: 120 mg via EPIDURAL

## 2014-06-05 NOTE — Discharge Instructions (Signed)

## 2014-06-09 ENCOUNTER — Telehealth: Payer: Self-pay | Admitting: Internal Medicine

## 2014-06-09 DIAGNOSIS — M5416 Radiculopathy, lumbar region: Secondary | ICD-10-CM

## 2014-06-09 DIAGNOSIS — M21371 Foot drop, right foot: Secondary | ICD-10-CM

## 2014-06-09 NOTE — Telephone Encounter (Signed)
Please call patient to schedule surgery

## 2014-06-09 NOTE — Telephone Encounter (Signed)
Referral entered. Pt made aware.

## 2014-06-13 DIAGNOSIS — Z6829 Body mass index (BMI) 29.0-29.9, adult: Secondary | ICD-10-CM | POA: Diagnosis not present

## 2014-06-13 DIAGNOSIS — M5416 Radiculopathy, lumbar region: Secondary | ICD-10-CM | POA: Diagnosis not present

## 2014-06-13 DIAGNOSIS — M4316 Spondylolisthesis, lumbar region: Secondary | ICD-10-CM | POA: Diagnosis not present

## 2014-06-19 DIAGNOSIS — Z01812 Encounter for preprocedural laboratory examination: Secondary | ICD-10-CM | POA: Diagnosis not present

## 2014-06-19 DIAGNOSIS — M5416 Radiculopathy, lumbar region: Secondary | ICD-10-CM | POA: Diagnosis not present

## 2014-06-24 DIAGNOSIS — M5416 Radiculopathy, lumbar region: Secondary | ICD-10-CM | POA: Diagnosis not present

## 2014-06-24 DIAGNOSIS — M4806 Spinal stenosis, lumbar region: Secondary | ICD-10-CM | POA: Diagnosis not present

## 2014-07-09 ENCOUNTER — Telehealth: Payer: Self-pay | Admitting: Internal Medicine

## 2014-07-09 DIAGNOSIS — E038 Other specified hypothyroidism: Secondary | ICD-10-CM

## 2014-07-09 MED ORDER — LEVOTHYROXINE SODIUM 125 MCG PO TABS: 125.0000 ug | ORAL_TABLET | Freq: Every day | ORAL | Status: DC

## 2014-07-09 NOTE — Telephone Encounter (Signed)
Notified pt updated rx [facility] to [facility]...Keith [name]

## 2014-07-09 NOTE — Telephone Encounter (Signed)
Medication dose changed from 150 mcg to 125 mcg on 05/11/14 due to change in TSH level per lab result.

## 2014-07-09 NOTE — Telephone Encounter (Signed)
Patient requesting refill for levothyroxine (SYNTHROID, LEVOTHROID) 125 MCG tablet [[id number]] Pharmacy is [facility]. [facility] last refill shows a different amount of mcg. They need a new script with the right amount.

## 2014-08-27 ENCOUNTER — Ambulatory Visit (INDEPENDENT_AMBULATORY_CARE_PROVIDER_SITE_OTHER): Payer: Medicare Other | Admitting: Family Medicine

## 2014-08-27 ENCOUNTER — Encounter: Payer: Self-pay | Admitting: Family Medicine

## 2014-08-27 ENCOUNTER — Other Ambulatory Visit (INDEPENDENT_AMBULATORY_CARE_PROVIDER_SITE_OTHER): Payer: Medicare Other

## 2014-08-27 VITALS — BP 138/84 | HR 81 | Wt 197.0 lb

## 2014-08-27 DIAGNOSIS — M4698 Unspecified inflammatory spondylopathy, sacral and sacrococcygeal region: Secondary | ICD-10-CM | POA: Diagnosis not present

## 2014-08-27 DIAGNOSIS — M549 Dorsalgia, unspecified: Secondary | ICD-10-CM | POA: Diagnosis not present

## 2014-08-27 DIAGNOSIS — M25561 Pain in right knee: Secondary | ICD-10-CM

## 2014-08-27 DIAGNOSIS — M47818 Spondylosis without myelopathy or radiculopathy, sacral and sacrococcygeal region: Secondary | ICD-10-CM

## 2014-08-27 NOTE — Patient Instructions (Signed)
Good to see you Try the pennsaid twice daily as needed to the knee We injected the SI joint today to see if it will help Keep taking care of yourself It is so good to see you are doing great See me again in 3-4 weeks if not gone.

## 2014-08-27 NOTE — Assessment & Plan Note (Signed)
Patient was given bilateral injections today. We discussed topical anti-inflammatory the patient was given. We discussed icing regimen. Patient given home exercises. Discussed with patient he needs to take care Himself to take better care of his wife. Patient will come back in 3-4 weeks for further evaluation.

## 2014-08-27 NOTE — Progress Notes (Signed)
[name] [name], [place] [address] Phone: [phone] Subjective:    CC:  Follow-up on lumbar radiculopathy, follow up heel pain.   [id number] [name] is a 74 y.o. male coming in with complaint of  back pain. . Patient did have x-rays at last exam that did show the patient does have diffuse degenerative changes with progression of the L2-L3 disc space as well as L4-L5 6 mm anterior listhesis. Patient continued to have difficulty. Patient was sent for an MRI that shows L4-L5 disc degeneration with stenosis as well as right foraminal stenosis and right subarticular stenosis with significant impingement on the L5 descending nerves.   patient did see neurosurgery and did have surgery. Patient is doing much better and no longer having any foot drop.  States though that the sacroiliac joint on his back seems to be giving him difficulty. Patient states that this is a sore. Patient has been taking care of his wife who has dementia and has noticed more difficulty recently. Patient states that if he tries to do too much with his back and does give him some discomfort over these areas specifically but this is much different than the herniated disc with no radicular symptoms down the legs. Patient states we did injections previously and they were very beneficial and is wondering if he can have that again.  Past medical history, social, surgical and family history all reviewed in electronic medical record.   Review of Systems: No headache, visual changes, nausea, vomiting, diarrhea, constipation, dizziness, abdominal pain, skin rash, fevers, chills, night sweats, weight loss, swollen lymph nodes, body aches, joint swelling, muscle aches, chest pain, shortness of breath, mood changes.   Objective Blood pressure 138/84, pulse 81, weight 197 lb (89.359 kg), SpO2 98 %.  General: No apparent distress alert and oriented x3 mood and affect normal, dressed  appropriately.  HEENT: Pupils equal, extraocular movements intact  Respiratory: Patient's speak in full sentences and does not appear short of breath  Cardiovascular: No lower extremity edema, non tender, no erythema  Skin: Warm dry intact with no signs of infection or rash on extremities or on axial skeleton.  Abdomen: Soft nontender  Neuro: Cranial nerves II through XII are intact, neurovascularly intact in all extremities but only has +1 Achilles reflex on the right compared to the contralateral side Lymph: No lymphadenopathy of posterior or anterior cervical chain or axillae bilaterally.  Gait  Mild antalgic gait MSK:  Non tender with full range of motion and good stability and symmetric strength and tone of shoulders, elbows, wrist, hip, knees bilaterally.  Back Exam:  Inspection: Unremarkable  Motion: Flexion 45 deg, Extension 30 deg, Side Bending to 45 deg bilaterally,  Rotation to 35 deg bilaterally  SLR laying: Continued discomfort on right side XSLR laying: Negative  Palpable tenderness: Nontender on exam today FABER: Positive bilateral Sensory change: Gross sensation intact to all lumbar and sacral dermatomes.  Reflexes: 1+ on right compared to 2+ on left patellar tendons,  1+ Achilles tendon on right compared to 2+ on left which and symmetric Babinski's downgoing.  Negative clonus  Procedure: Real-time Ultrasound Guided Injection of right sacroiliac joint Device: GE Logiq E  Ultrasound guided injection is preferred based studies that show increased duration, increased effect, greater accuracy, decreased procedural pain, increased response rate, and decreased cost with ultrasound guided versus blind injection.  Verbal informed consent obtained.  Time-out conducted.  Noted no overlying erythema, induration, or other signs of  local infection.  Skin prepped in a sterile fashion.  Local anesthesia: Topical Ethyl chloride.  With sterile technique and under real time ultrasound  guidance: With a 21-gauge 2 inch needle patient was injected with 0.5 mL of 0.5% Marcaine and 0.5 mL of Kenalog 40 mg/dL.  Completed without difficulty  Pain immediately resolved suggesting accurate placement of the medication.  Advised to call if fevers/chills, erythema, induration, drainage, or persistent bleeding.  Images permanently stored and available for review in the ultrasound unit.  Impression: Technically successful ultrasound guided injection.    Procedure: Real-time Ultrasound Guided Injection of left sacroiliac joint Device: GE Logiq E  Ultrasound guided injection is preferred based studies that show increased duration, increased effect, greater accuracy, decreased procedural pain, increased response rate, and decreased cost with ultrasound guided versus blind injection.  Verbal informed consent obtained.  Time-out conducted.  Noted no overlying erythema, induration, or other signs of local infection.  Skin prepped in a sterile fashion.  Local anesthesia: Topical Ethyl chloride.  With sterile technique and under real time ultrasound guidance: With a 21-gauge 2 inch needle patient was injected with 0.5 mL of 0.5% Marcaine and 0.5 mL of Kenalog 40 mg/dL.  Completed without difficulty  Pain immediately resolved suggesting accurate placement of the medication.  Advised to call if fevers/chills, erythema, induration, drainage, or persistent bleeding.  Images permanently stored and available for review in the ultrasound unit.  Impression: Technically successful ultrasound guided injection.  Impression and Recommendations:     This case required medical decision making of moderate complexity.

## 2014-10-02 ENCOUNTER — Encounter: Payer: Self-pay | Admitting: Family Medicine

## 2014-10-02 ENCOUNTER — Ambulatory Visit (INDEPENDENT_AMBULATORY_CARE_PROVIDER_SITE_OTHER): Payer: Medicare Other | Admitting: Family Medicine

## 2014-10-02 VITALS — BP 130/82 | HR 73 | Ht 69.0 in | Wt 199.0 lb

## 2014-10-02 DIAGNOSIS — M5416 Radiculopathy, lumbar region: Secondary | ICD-10-CM

## 2014-10-02 MED ORDER — METHYLPREDNISOLONE ACETATE 80 MG/ML IJ SUSP
80.0000 mg | Freq: Once | INTRAMUSCULAR | Status: AC
  Administered 2014-10-02: 80 mg via INTRAMUSCULAR

## 2014-10-02 MED ORDER — KETOROLAC TROMETHAMINE 60 MG/2ML IM SOLN
60.0000 mg | Freq: Once | INTRAMUSCULAR | Status: AC
  Administered 2014-10-02: 60 mg via INTRAMUSCULAR

## 2014-10-02 NOTE — Assessment & Plan Note (Signed)
Patient does have some back pain patient is not having the radicular symptoms but I am concerned that this is coming from his back. Patient will try the IM injections and see if this will be beneficial. Encourage the conservative therapy. If continuing to have difficulty I would consider an epidural steroid injection at L4-L5. Patient will continue the medications we prescribed previously. We'll here for patient back in 1 week. If we do have an epidural he will come back 2 weeks afterwards to discuss.  Spent  25 minutes with patient face-to-face and had greater than 50% of counseling including as described above in assessment and plan.

## 2014-10-02 NOTE — Patient Instructions (Addendum)
I am sorry the injections did not help.  We will try the cocktail shots again Continue the vitamins Take Tart cherry extract at night If not better next week call me and we will get another epidural

## 2014-10-02 NOTE — Progress Notes (Signed)
Pre visit review using our clinic review tool, if applicable. No additional management support is needed unless otherwise documented below in the visit note. 

## 2014-10-02 NOTE — Progress Notes (Signed)
  [name] [address], [place] [address] Phone: [phone] Subjective:    CC:  Follow-up on lumbar radiculopathy.  [id number] Keith [name] is a 74 y.o. male coming in with complaint of  back pain. . Patient did have x-rays at last exam that did show the patient does have diffuse degenerative changes with progression of the L2-L3 disc space as well as L4-L5 6 mm anterior listhesis. Patient continued to have difficulty. Patient was sent for an MRI that shows L4-L5 disc degeneration with stenosis as well as right foraminal stenosis and right subarticular stenosis with significant impingement on the L5 descending nerves.   patient did see neurosurgery and did have surgery. Patient is doing much better and no longer having any foot drop.  States though that the sacroiliac joint on his back seems to be giving him difficulty. Patient was given injections at last exam and did not notice any significant improvement at all. Patient states that the IM injections she's had previously has helped and is wondering if he can have that. States that this feels more muscular not his lower back. Patient states that there is no significant radicular symptoms and does not feel like when he was having the nerve problem previously.  Past medical history, social, surgical and family history all reviewed in electronic medical record.   Review of Systems: No headache, visual changes, nausea, vomiting, diarrhea, constipation, dizziness, abdominal pain, skin rash, fevers, chills, night sweats, weight loss, swollen lymph nodes, body aches, joint swelling, muscle aches, chest pain, shortness of breath, mood changes.   Objective Blood pressure 130/82, pulse 73, height 5\' 9"  (1.753 m), weight 199 lb (90.266 kg), SpO2 97 %.  General: No apparent distress alert and oriented x3 mood and affect normal, dressed appropriately.  HEENT: Pupils equal, extraocular movements intact    Respiratory: Patient's speak in full sentences and does not appear short of breath  Cardiovascular: No lower extremity edema, non tender, no erythema  Skin: Warm dry intact with no signs of infection or rash on extremities or on axial skeleton.  Abdomen: Soft nontender  Neuro: Cranial nerves II through XII are intact, neurovascularly intact in all extremities but only has +1 Achilles reflex on the right compared to the contralateral side Lymph: No lymphadenopathy of posterior or anterior cervical chain or axillae bilaterally.  Gait  normal gait MSK:  Non tender with full range of motion and good stability and symmetric strength and tone of shoulders, elbows, wrist, hip, knees bilaterally.  Back Exam:  Inspection: Unremarkable  Motion: Flexion 45 deg, Extension 30 deg, Side Bending to 45 deg bilaterally,  Rotation to 35 deg bilaterally  SLR laying: Continued discomfort on right side XSLR laying: Negative  Palpable tenderness: Nontender on exam today FABER: Positive bilateral Sensory change: Gross sensation intact to all lumbar and sacral dermatomes.  Reflexes: Flexes 2+ and symmetric which is improved. .  Impression and Recommendations:     This case required medical decision making of moderate complexity.

## 2014-10-06 ENCOUNTER — Other Ambulatory Visit: Payer: Self-pay | Admitting: Internal Medicine

## 2014-10-08 ENCOUNTER — Telehealth: Payer: Self-pay | Admitting: Internal Medicine

## 2014-10-08 DIAGNOSIS — M5416 Radiculopathy, lumbar region: Secondary | ICD-10-CM

## 2014-10-08 NOTE — Telephone Encounter (Signed)
Pt called and is needing an appointment for an epidural injection

## 2014-10-08 NOTE — Telephone Encounter (Signed)
Referral entered  

## 2014-10-16 ENCOUNTER — Ambulatory Visit
Admission: RE | Admit: 2014-10-16 | Discharge: 2014-10-16 | Disposition: A | Discharge status: Home / Self Care | Payer: Medicare Other | Source: Ambulatory Visit | Attending: Family Medicine | Admitting: Family Medicine

## 2014-10-16 DIAGNOSIS — M545 Low back pain: Secondary | ICD-10-CM | POA: Diagnosis not present

## 2014-10-16 DIAGNOSIS — M5416 Radiculopathy, lumbar region: Secondary | ICD-10-CM

## 2014-10-16 MED ORDER — IOHEXOL 180 MG/ML  SOLN
1.0000 mL | Freq: Once | INTRAMUSCULAR | Status: DC | PRN
  Administered 2014-10-16: 1 mL via INTRAVENOUS

## 2014-10-16 MED ORDER — METHYLPREDNISOLONE ACETATE 40 MG/ML INJ SUSP (RADIOLOG
120.0000 mg | Freq: Once | INTRAMUSCULAR | Status: AC
  Administered 2014-10-16: 120 mg via EPIDURAL

## 2014-10-16 NOTE — Discharge Instructions (Signed)

## 2014-11-04 ENCOUNTER — Other Ambulatory Visit (INDEPENDENT_AMBULATORY_CARE_PROVIDER_SITE_OTHER): Payer: Medicare Other

## 2014-11-04 ENCOUNTER — Encounter: Payer: Self-pay | Admitting: Internal Medicine

## 2014-11-04 ENCOUNTER — Ambulatory Visit (INDEPENDENT_AMBULATORY_CARE_PROVIDER_SITE_OTHER): Payer: Medicare Other | Admitting: Internal Medicine

## 2014-11-04 VITALS — BP 130/86 | HR 68 | Temp 97.9°F | Resp 16 | Ht 69.0 in | Wt 192.0 lb

## 2014-11-04 DIAGNOSIS — Z23 Encounter for immunization: Secondary | ICD-10-CM | POA: Diagnosis not present

## 2014-11-04 DIAGNOSIS — E038 Other specified hypothyroidism: Secondary | ICD-10-CM

## 2014-11-04 DIAGNOSIS — H8110 Benign paroxysmal vertigo, unspecified ear: Secondary | ICD-10-CM | POA: Diagnosis not present

## 2014-11-04 LAB — CBC WITH DIFFERENTIAL/PLATELET
Basophils Absolute: 0.1 10*3/uL (ref 0.0–0.1)
Basophils Relative: 0.6 % (ref 0.0–3.0)
Eosinophils Absolute: 0.1 10*3/uL (ref 0.0–0.7)
Eosinophils Relative: 1.3 % (ref 0.0–5.0)
HCT: 48.6 % (ref 39.0–52.0)
Hemoglobin: 16.2 g/dL (ref 13.0–17.0)
LYMPHS PCT: 19.5 % (ref 12.0–46.0)
Lymphs Abs: 2 10*3/uL (ref 0.7–4.0)
MCHC: 33.3 g/dL (ref 30.0–36.0)
MCV: 90.1 fl (ref 78.0–100.0)
MONOS PCT: 6.3 % (ref 3.0–12.0)
Monocytes Absolute: 0.6 10*3/uL (ref 0.1–1.0)
NEUTROS ABS: 7.3 10*3/uL (ref 1.4–7.7)
Neutrophils Relative %: 72.3 % (ref 43.0–77.0)
PLATELETS: 179 10*3/uL (ref 150.0–400.0)
RBC: 5.4 Mil/uL (ref 4.22–5.81)
RDW: 14.5 % (ref 11.5–15.5)
WBC: 10.2 10*3/uL (ref 4.0–10.5)

## 2014-11-04 LAB — TSH: TSH: 3.36 u[IU]/mL (ref 0.35–4.50)

## 2014-11-04 MED ORDER — LEVOTHYROXINE SODIUM 125 MCG PO TABS
125.0000 ug | ORAL_TABLET | Freq: Every day | ORAL | Status: DC
Start: 2014-11-04 — End: 2015-10-01

## 2014-11-04 MED ORDER — MECLIZINE HCL 12.5 MG PO TABS: 12.5000 mg | ORAL_TABLET | Freq: Three times a day (TID) | ORAL | Status: DC | PRN

## 2014-11-04 NOTE — Patient Instructions (Signed)

## 2014-11-04 NOTE — Progress Notes (Signed)
Pre visit review using our clinic review tool, if applicable. No additional management support is needed unless otherwise documented below in the visit note. 

## 2014-11-04 NOTE — Progress Notes (Signed)
Subjective:  Patient ID: Keith [name], male    DOB: 03-01-1940  Age: 74 y.o. MRN: [id number]  CC: Hypothyroidism   HPI [name] presents for a 3 week hx of dizziness, vertigo, mild ataxia.  Outpatient Prescriptions Prior to Visit  Medication Sig Dispense Refill  . aspirin 81 MG EC tablet Take 81 mg by mouth daily.      . budesonide-formoterol (SYMBICORT) 160-4.5 MCG/ACT inhaler Inhale 2 puffs into the lungs 2 (two) times daily. 1 Inhaler 12  . Ciclopirox 0.77 % gel apply to affected area twice a day for 14 days 60 g 3  . fish oil-omega-3 fatty acids 1000 MG capsule Take 1 g by mouth daily.      . fluticasone (FLONASE) 50 MCG/ACT nasal spray instill 2 sprays into each nostril once daily 16 g 11  . gabapentin (NEURONTIN) 100 MG capsule Take 1 capsule (100 mg total) by mouth at bedtime. 30 capsule 3  . meloxicam (MOBIC) 15 MG tablet Take 1 tablet (15 mg total) by mouth daily. 30 tablet 5  . Multiple Vitamins-Minerals (MULTIVITAMIN,TX-MINERALS) tablet Take 1 tablet by mouth daily.      . Red Yeast Rice 600 MG TABS      . levothyroxine (SYNTHROID, LEVOTHROID) 125 MCG tablet Take 1 tablet (125 mcg total) by mouth daily. 90 tablet 3   No facility-administered medications prior to visit.    ROS Review of Systems  Constitutional: Negative.  Negative for fever, chills, diaphoresis, appetite change and fatigue.  HENT: Negative.  Negative for congestion, postnasal drip, rhinorrhea, sinus pressure, sneezing, sore throat and voice change.   Eyes: Negative.  Negative for photophobia, redness and visual disturbance.  Respiratory: Negative.  Negative for cough, choking, chest tightness, shortness of breath and stridor.   Cardiovascular: Negative.  Negative for chest pain, palpitations and leg swelling.  Gastrointestinal: Negative.  Negative for nausea, vomiting, abdominal pain, diarrhea, constipation and blood in stool.  Endocrine: Negative.   Genitourinary: Negative.  Negative for dysuria,  frequency and difficulty urinating.  Musculoskeletal: Positive for gait problem. Negative for myalgias, back pain, joint swelling, arthralgias and neck pain.  Skin: Negative.  Negative for rash.  Allergic/Immunologic: Negative.   Neurological: Positive for dizziness. Negative for tremors, seizures, syncope, facial asymmetry, speech difficulty, weakness, light-headedness, numbness and headaches.  Hematological: Negative.  Negative for adenopathy. Does not bruise/bleed easily.  Psychiatric/Behavioral: Negative.     Objective:  BP 130/86 mmHg  Pulse 68  Temp(Src) 97.9 F (36.6 C) (Oral)  Resp 16  Ht 5\' 9"  (1.753 m)  Wt 192 lb (87.091 kg)  BMI 28.34 kg/m2  SpO2 95%  BP Readings from Last 3 Encounters:  11/04/14 130/86  10/16/14 147/95  10/02/14 130/82    Wt Readings from Last 3 Encounters:  11/04/14 192 lb (87.091 kg)  10/02/14 199 lb (90.266 kg)  08/27/14 197 lb (89.359 kg)    Physical Exam  Constitutional: He is oriented to person, place, and time. He appears well-developed and well-nourished. No distress.  HENT:  Head: Normocephalic and atraumatic.  Mouth/Throat: Oropharynx is clear and moist. No oropharyngeal exudate.  Eyes: Conjunctivae and EOM are normal. Pupils are equal, round, and reactive to light. Right eye exhibits no discharge. Left eye exhibits no discharge. No scleral icterus.  Neck: Normal range of motion. Neck supple. No JVD present. No tracheal deviation present. No thyromegaly present.  Cardiovascular: Normal rate, regular rhythm, normal heart sounds and intact distal pulses.  Exam reveals no gallop and no friction rub.  No murmur heard. Pulmonary/Chest: Effort normal and breath sounds normal. No stridor. No respiratory distress. He has no wheezes. He has no rales. He exhibits no tenderness.  Abdominal: Soft. Bowel sounds are normal. He exhibits no distension and no mass. There is no tenderness. There is no rebound and no guarding.  Musculoskeletal: Normal  range of motion. He exhibits no edema or tenderness.  Lymphadenopathy:    He has no cervical adenopathy.  Neurological: He is alert and oriented to person, place, and time. He has normal strength. He displays no atrophy, no tremor and normal reflexes. No cranial nerve deficit or sensory deficit. He exhibits normal muscle tone. He displays a negative Romberg sign. He displays no seizure activity. Coordination and gait normal. He displays no Babinski's sign on the right side. He displays no Babinski's sign on the left side.  Reflex Scores:      Tricep reflexes are 0 on the right side and 0 on the left side.      Bicep reflexes are 0 on the right side and 0 on the left side.      Brachioradialis reflexes are 0 on the right side and 0 on the left side.      Patellar reflexes are 0 on the right side and 0 on the left side.      Achilles reflexes are 0 on the right side and 0 on the left side. Skin: Skin is warm and dry. No rash noted. He is not diaphoretic. No erythema. No pallor.  Psychiatric: He has a normal mood and affect. His behavior is normal. Judgment and thought content normal.  Vitals reviewed.   Lab Results  Component Value Date   WBC 10.2 11/04/2014   HGB 16.2 11/04/2014   HCT 48.6 11/04/2014   PLT 179.0 11/04/2014   GLUCOSE 177* 05/09/2014   CHOL 172 03/07/2014   TRIG 145.0 03/07/2014   HDL 40.50 03/07/2014   LDLCALC 103* 03/07/2014   ALT 19 05/09/2014   AST 18 05/09/2014   NA 136 05/09/2014   K 4.2 05/09/2014   CL 103 05/09/2014   CREATININE 1.04 05/09/2014   BUN 22 05/09/2014   CO2 28 05/09/2014   TSH 3.36 11/04/2014   PSA 3.82 03/07/2014    Dg Epidurography  10/16/2014   CLINICAL DATA:  Lumbosacral spondylosis without myelopathy. Lumbago. Previous lumbar decompression at L4-5 with improvement in foot drop. Prior imaging showed large disc extrusion at L2-3, which may be symptomatic. Interlaminar epidural could not be performed at L4-5 due to prior surgery.  EXAM:  LUMBAR INTERLAMINAR EPIDURAL INJECTION  FLUOROSCOPY TIME:  16 seconds corresponding to a dose of 0.3 Gy cm squared  PROCEDURE: Procedure: Informed written consent was obtained on the prior visit. Time-out was performed.  We discussed the moderate likelihood of moderate lasting relief/attainment of therapeutic goal. The overlying skin was cleansed with betadine soap and anesthetized with 1% lidocaine without epinephrine. An interlaminar approach was performed at L2-3. 20 gauge needle was advanced using loss-of-resistance technique.  DIAGNOSTIC/THERAPUETIC EPIDURAL INJECTION: Injection of Omnipaque 180 shows a good epidural pattern with spread above and below the level of needle placement, primarily on the side of needle placement. No vascular or subarachnoid opacification was seen. 120 mg of Depo-Medrol mixed with 5 cc of 1% Lidocaine were instilled. The procedure was well-tolerated, and the patient was discharged thirty minutes following the injection in good condition.  IMPRESSION: Technically successful second lumbar interlaminar epidural injection at L2-L3.   Electronically Signed   By:  Staci Righter M.D.   On: 10/16/2014 12:51    Assessment & Plan:   Justan was seen today for hypothyroidism.  Diagnoses and all orders for this visit:  Other specified hypothyroidism- his TSH is in the normal range, he will stay on the current dose of Synthroid. -     CBC with Differential/Platelet; Future -     TSH; Future -     levothyroxine (SYNTHROID, LEVOTHROID) 125 MCG tablet; Take 1 tablet (125 mcg total) by mouth daily.  Need for influenza vaccination -     Flu Vaccine QUAD 36+ mos IM  Benign paroxysmal positional vertigo, unspecified laterality- he has signs and symptoms consistent with BPPV. His neurologic exam is normal and he has no alarming features. He will try symptom relief with meclizine and will let me know if he develops any new or worsening symptoms. -     meclizine (ANTIVERT) 12.5 MG tablet;  Take 1 tablet (12.5 mg total) by mouth 3 (three) times daily as needed for dizziness.  I am having Mr. Kring start on meclizine. I am also having him maintain his aspirin, (multivitamin,tx-minerals), fish oil-omega-3 fatty acids, Red Yeast Rice, budesonide-formoterol, Ciclopirox, gabapentin, meloxicam, fluticasone, and levothyroxine.  Meds ordered this encounter  Medications  . meclizine (ANTIVERT) 12.5 MG tablet    Sig: Take 1 tablet (12.5 mg total) by mouth 3 (three) times daily as needed for dizziness.    Dispense:  30 tablet    Refill:  0  . levothyroxine (SYNTHROID, LEVOTHROID) 125 MCG tablet    Sig: Take 1 tablet (125 mcg total) by mouth daily.    Dispense:  90 tablet    Refill:  1     Follow-up: Return in about 3 weeks (around 11/25/2014).  Scarlette Calico, MD

## 2014-11-19 ENCOUNTER — Other Ambulatory Visit: Payer: Self-pay

## 2014-11-19 DIAGNOSIS — H8110 Benign paroxysmal vertigo, unspecified ear: Secondary | ICD-10-CM

## 2014-11-19 MED ORDER — MECLIZINE HCL 12.5 MG PO TABS: 12.5000 mg | ORAL_TABLET | Freq: Three times a day (TID) | ORAL | Status: DC | PRN

## 2014-12-17 ENCOUNTER — Encounter: Payer: Self-pay | Admitting: Family Medicine

## 2014-12-17 ENCOUNTER — Other Ambulatory Visit (INDEPENDENT_AMBULATORY_CARE_PROVIDER_SITE_OTHER): Payer: Medicare Other

## 2014-12-17 ENCOUNTER — Ambulatory Visit (INDEPENDENT_AMBULATORY_CARE_PROVIDER_SITE_OTHER): Payer: Medicare Other | Admitting: Family Medicine

## 2014-12-17 VITALS — BP 122/82 | HR 66 | Wt 199.0 lb

## 2014-12-17 DIAGNOSIS — M79642 Pain in left hand: Secondary | ICD-10-CM

## 2014-12-17 DIAGNOSIS — M5416 Radiculopathy, lumbar region: Secondary | ICD-10-CM | POA: Diagnosis not present

## 2014-12-17 DIAGNOSIS — M1812 Unilateral primary osteoarthritis of first carpometacarpal joint, left hand: Secondary | ICD-10-CM

## 2014-12-17 NOTE — Progress Notes (Signed)
CC: Left thumb pain  HPI: 74 yo male coming in for pain in the left thumb. Patient states 3 days ago he was helping somebody in the yard put up a fence. 24 hours later his left thumb started having severe pain and swelling. Patient does have a past medical history significant for CMC arthritis. Patient did have an injection in this joint greater than urine have ago. Patient states that this is more of a dull throbbing aching sensation. It is stopping him from certain activities. Denies any numbness or any weakness. Patient though has not been using it as much last couple days because it's been so painful.  Past medical, surgical, family and social history reviewed. Medications reviewed all in the electronic medical record.  Review of Systems: No headache, visual changes, nausea, vomiting, diarrhea, constipation, dizziness, abdominal pain, skin rash, fevers, chills, night sweats, weight loss, swollen lymph nodes, body aches, joint swelling, muscle aches, chest pain, shortness of breath, mood changes.   Objective:    Blood pressure 122/82, pulse 66, weight 199 lb (90.266 kg), SpO2 97 %.   General: No apparent distress alert and oriented x3 mood and affect normal, dressed appropriately.  HEENT: Pupils equal, extraocular movements intact Respiratory: Patient's speak in full sentences and does not appear short of breath Cardiovascular: No lower extremity edema, non tender, no erythema Skin: Warm dry intact with no signs of infection or rash on extremities or on axial skeleton. Abdomen: Soft nontender Neuro: Cranial nerves II through XII are intact, neurovascularly intact in all extremities with 2+ DTRs and 2+ pulses. Lymph: No lymphadenopathy of posterior or anterior cervical chain or axillae bilaterally.  Gait normal with good balance and coordination.  MSK: Non tender with full range of motion and good stability and symmetric strength and tone of the hip, shoulders, elbows,  knee and ankles  bilaterally.  Wrist: Left Inspection normal with no visible erythema or swelling. ROM smooth and normal with good flexion and extension and ulnar/radial deviation that is symmetrical with opposite wrist. No snuffbox tenderness. No tenderness over [place]. Strength 5/5 in all directions without pain. Negative Finkelstein, tinel's and phalens. Negative Watson's test. Positive pain over the [facility] joint with palpation as well as positive grind test  MSK US performed of: Left wrist This study was ordered, performed, and interpreted by Charlann Boxer D.O.  Wrist: All extensor compartments visualized and tendons all normal in appearance without fraying, tears, or sheath effusions. No effusion seen. TFCC intact. Scapholunate ligament intact. Carpal tunnel visualized and median nerve area normal, flexor tendons all normal in appearance without fraying, tears, or sheath effusions. Power doppler signal normal. Patient does have severe arthritis of the [facility] joint  IMPRESSION:  Severe osteoarthritic changes of the CMC joint.    Procedure: Real-time Ultrasound Guided Injection of the left CMC joint  Device: GE Logiq E  Ultrasound guided injection is preferred based studies that show increased duration, increased effect, greater accuracy, decreased procedural pain, increased response rate with ultrasound guided versus blind injection.  Verbal informed consent obtained.  Time-out conducted.  Noted no overlying erythema, induration, or other signs of local infection.  Skin prepped in a sterile fashion.  Local anesthesia: Topical Ethyl chloride.  Patient then under ultrasound did have [place] joint localized. Patient then had 25-gauge 1 inch needle injected into the [facility] joint and had 0.5 cc of 0.5% Marcaine and 0.5 cc of Kenalog 40 mg/dL. Completed without difficulty  Pain immediately resolved suggesting accurate placement of the medication.  Advised to call if fevers/chills, erythema, induration,  drainage, or persistent bleeding.  Images permanently stored and available for review in the ultrasound unit.  Impression: Technically successful ultrasound guided injection.  Impression and Recommendations:     This case required medical decision making of moderate complexity.

## 2014-12-17 NOTE — Patient Instructions (Signed)
Good to see you Ice 20 minutes 2 times a day Continue the vitamins We order another epidural.  pennsaid pinkie amount topically 2 times daily as needed.  See me again in 3 weeks if not perfect

## 2014-12-17 NOTE — Assessment & Plan Note (Signed)
Patient does have severe arthritis. Patient has been taking over-the-counter natural supplementations. Patient has tried to remain active on a regular basis. We discussed the possibility of bracing which patient declined. Patient will try topical anti-inflammatories and was given an injection today with good resolution of pain. Patient will follow-up and make sure that he is continuing to improve in 3-6 weeks.  Spent  25 minutes with patient face-to-face and had greater than 50% of counseling including as described above in assessment and plan.

## 2014-12-17 NOTE — Progress Notes (Signed)
Pre visit review using our clinic review tool, if applicable. No additional management support is needed unless otherwise documented below in the visit note. 

## 2014-12-18 ENCOUNTER — Telehealth: Payer: Self-pay | Admitting: Internal Medicine

## 2014-12-18 DIAGNOSIS — H8110 Benign paroxysmal vertigo, unspecified ear: Secondary | ICD-10-CM

## 2014-12-18 NOTE — Telephone Encounter (Signed)
Patient states that the "vertigo" has not improved. He stopped taking the prescribed med after about a week and a half. He felt that it was not effective. He also tried some of the exercises. He feels as if it is not actually vertigo. The symptoms occur sporadically and are not severe at this point. He mentioned that you told him that we may send him for an MRI if SX do not improve.

## 2014-12-20 NOTE — Telephone Encounter (Signed)
MRI ordered Referral to neurology ordered

## 2014-12-24 ENCOUNTER — Encounter: Payer: Self-pay | Admitting: Neurology

## 2014-12-24 ENCOUNTER — Other Ambulatory Visit (INDEPENDENT_AMBULATORY_CARE_PROVIDER_SITE_OTHER): Payer: Medicare Other

## 2014-12-24 ENCOUNTER — Ambulatory Visit (INDEPENDENT_AMBULATORY_CARE_PROVIDER_SITE_OTHER): Payer: Medicare Other | Admitting: Neurology

## 2014-12-24 VITALS — BP 100/64 | HR 70 | Ht 69.5 in | Wt 199.0 lb

## 2014-12-24 DIAGNOSIS — R2681 Unsteadiness on feet: Secondary | ICD-10-CM

## 2014-12-24 DIAGNOSIS — R42 Dizziness and giddiness: Secondary | ICD-10-CM

## 2014-12-24 LAB — BASIC METABOLIC PANEL
BUN: 14 mg/dL (ref 6–23)
CO2: 29 meq/L (ref 19–32)
Calcium: 9.4 mg/dL (ref 8.4–10.5)
Chloride: 105 mEq/L (ref 96–112)
Creatinine, Ser: 1.07 mg/dL (ref 0.40–1.50)
GFR: 71.95 mL/min (ref >60.00)
GLUCOSE: 77 mg/dL (ref 70–99)
POTASSIUM: 4 meq/L (ref 3.5–5.1)
Sodium: 140 mEq/L (ref 135–145)

## 2014-12-24 NOTE — Patient Instructions (Signed)
It sounds like typical vertigo, however if you don't feel 100% better after several weeks, then it would be a good idea to get an MRI of the brain. I will contact you with results and further recommendations.

## 2014-12-24 NOTE — Progress Notes (Signed)
NEUROLOGY CONSULTATION NOTE  Keith [name] MRN: [id number] DOB: April 21, 1940  Referring provider: Dr. Ronnald Ramp Primary care provider: Dr. Ronnald Ramp  Reason for consult:  vertigo  HISTORY OF PRESENT ILLNESS: Keith [name] is a 74 year old right-handed male with hypothyroidism, hyperlipidemia, osteoarthritis and lumbar radiculopathy who presents for vertigo.  History obtained by patient and PCP note.  Labs reviewed.  For about two months, he had been experiencing vertigo.  It is not a spinning sensation, but still a sensation of movement.  It is triggered by change in position or head movement and lasts a couple of seconds.  He will sometimes note double vision for a moment, which corrects when he blinks.  There is no associated nausea, vomiting or diaphoresis.  He denies associated headache, slurred speech, focal numbness or weakness.  He tried vestibular exercises that he found on the internet, which did not help.  The actual dizzy spells have started to improve, except that he still feels "off" like there is "always something there".  He does feel a little unsteady on his feet, particularly when he maneuvers while walking, such as side-stepping or changing direction. However, this is a longstanding issue and he does have back issues.   Recent labs include normal CBC and TSH 3.36.  PAST MEDICAL HISTORY: Past Medical History  Diagnosis Date  . Allergic rhinitis   . Hyperlipidemia   . DDD (degenerative disc disease), cervical   . Hypothyroidism     PAST SURGICAL HISTORY: Past Surgical History  Procedure Laterality Date  . Tonsillectomy    . Back surgery  2016    MEDICATIONS: Current Outpatient Prescriptions on File Prior to Visit  Medication Sig Dispense Refill  . aspirin 81 MG EC tablet Take 81 mg by mouth daily.      . fish oil-omega-3 fatty acids 1000 MG capsule Take 1 g by mouth daily.      . fluticasone (FLONASE) 50 MCG/ACT nasal spray instill 2 sprays into each nostril once daily  16 g 11  . levothyroxine (SYNTHROID, LEVOTHROID) 125 MCG tablet Take 1 tablet (125 mcg total) by mouth daily. 90 tablet 1  . Multiple Vitamins-Minerals (MULTIVITAMIN,TX-MINERALS) tablet Take 1 tablet by mouth daily.      . Red Yeast Rice 600 MG TABS       No current facility-administered medications on file prior to visit.    ALLERGIES: Allergies  Allergen Reactions  . Atorvastatin Other (See Comments)    leg cramps  . Rosuvastatin Other (See Comments)    myalgias    FAMILY HISTORY: Family History  Problem Relation Age of Onset  . Coronary artery disease Father     Fatal MI  . Coronary artery disease Other   . Breast cancer Other   . Diabetes Father   . Diabetes Mother     SOCIAL HISTORY: Social History   Social History  . Marital Status: Married    Spouse Name: N/A  . Number of Children: N/A  . Years of Education: N/A   Occupational History  . Retired Arboriculturist   . [employer]    Social History Main Topics  . Smoking status: Never Smoker   . Smokeless tobacco: Never Used  . Alcohol Use: No  . Drug Use: No  . Sexual Activity: Not Currently   Other Topics Concern  . Not on file   Social History Narrative   Regular Exercise -  YES          REVIEW OF SYSTEMS:  Constitutional: No fevers, chills, or sweats, no generalized fatigue, change in appetite Eyes: No visual changes, double vision, eye pain Ear, nose and throat: No hearing loss, ear pain, nasal congestion, sore throat Cardiovascular: No chest pain, palpitations Respiratory:  No shortness of breath at rest or with exertion, wheezes GastrointestinaI: No nausea, vomiting, diarrhea, abdominal pain, fecal incontinence Genitourinary:  No dysuria, urinary retention or frequency Musculoskeletal:  No neck pain, back pain Integumentary: No rash, pruritus, skin lesions Neurological: as above Psychiatric: No depression, insomnia, anxiety Endocrine: No palpitations, fatigue, diaphoresis, mood swings,  change in appetite, change in weight, increased thirst Hematologic/Lymphatic:  No anemia, purpura, petechiae. Allergic/Immunologic: no itchy/runny eyes, nasal congestion, recent allergic reactions, rashes  PHYSICAL EXAM: Filed Vitals:   12/24/14 1227  BP: 100/64  Pulse: 70   General: No acute distress.  Patient appears well-groomed.  Head:  Normocephalic/atraumatic Eyes:  fundi unremarkable, without vessel changes, exudates, hemorrhages or papilledema. Neck: supple, no paraspinal tenderness, full range of motion Back: No paraspinal tenderness Heart: regular rate and rhythm Lungs: Clear to auscultation bilaterally. Vascular: No carotid bruits. Neurological Exam: Mental status: alert and oriented to person, place, and time, recent and remote memory intact, fund of knowledge intact, attention and concentration intact, speech fluent and not dysarthric, language intact. Cranial nerves: CN I: not tested CN II: pupils equal, round and reactive to light, visual fields intact, fundi unremarkable, without vessel changes, exudates, hemorrhages or papilledema. CN III, IV, VI:  full range of motion, no nystagmus, no ptosis CN V: facial sensation intact CN VII: upper and lower face symmetric CN VIII: hearing intact CN IX, X: gag intact, uvula midline CN XI: sternocleidomastoid and trapezius muscles intact CN XII: tongue midline Bulk & Tone: normal, no fasciculations. Motor:  5/5 throughout Sensation:  Pinprick sensation intact (except decreased on top of right foot).  Decreased vibration sensation in feet. Deep Tendon Reflexes:  2+ throughout, toes downgoing. Finger to nose testing:  Without dysmetria.  Heel to shin:  Without dysmetria.  Gait:  Normal station and stride.  Able to turn, difficulty with tandem walk. Romberg negative.  IMPRESSION: Vertigo.  I suspect benign paroxysmal positional vertigo, however since symptoms have persisted for 2 months and he still feels "off", I would pursue  further testing. Gait instability when tandem walking.  This is longstanding.  It may be related to neuropathy in the feet.  PLAN: Since he has not returned to baseline after 2 months, we will check MRI of brain Further recommendations pending results.  If symptoms do not continue to improve, he may consider vestibular rehab  Thank you for allowing me to take part in the care of this patient.  Metta Clines, DO  CC:  Scarlette Calico, MD

## 2014-12-26 ENCOUNTER — Telehealth: Payer: Self-pay | Admitting: Internal Medicine

## 2014-12-26 NOTE — Telephone Encounter (Signed)
Received call from [place] @ [facility]. She states she called the patient to schedule his epidural injection and he said it's his left side that is hurting now, not the right side as the order states. Chrys Racer would like clarification. CB# [phone].  She is aware that Dr. Tamala Julian is out of the office this afternoon.

## 2014-12-29 NOTE — Telephone Encounter (Signed)
Left detailed msg w/ caroline giving her the okay to change the order for the epidural to be given on the left side.

## 2014-12-29 NOTE — Addendum Note (Signed)
Addended by: Douglass Rivers T on: 12/29/2014 02:20 PM   Modules accepted: Orders

## 2015-01-07 ENCOUNTER — Other Ambulatory Visit: Payer: Self-pay | Admitting: Family Medicine

## 2015-01-07 ENCOUNTER — Ambulatory Visit
Admission: RE | Admit: 2015-01-07 | Discharge: 2015-01-07 | Disposition: A | Discharge status: Home / Self Care | Payer: Medicare Other | Source: Ambulatory Visit | Attending: Family Medicine | Admitting: Family Medicine

## 2015-01-07 ENCOUNTER — Inpatient Hospital Stay: Admission: RE | Admit: 2015-01-07 | Payer: Medicare Other | Source: Ambulatory Visit

## 2015-01-07 DIAGNOSIS — M545 Low back pain: Secondary | ICD-10-CM | POA: Diagnosis not present

## 2015-01-07 DIAGNOSIS — M5416 Radiculopathy, lumbar region: Secondary | ICD-10-CM

## 2015-01-07 MED ORDER — METHYLPREDNISOLONE ACETATE 40 MG/ML INJ SUSP (RADIOLOG
120.0000 mg | Freq: Once | INTRAMUSCULAR | Status: AC
  Administered 2015-01-07: 120 mg via EPIDURAL

## 2015-01-07 MED ORDER — IOHEXOL 180 MG/ML  SOLN
1.0000 mL | Freq: Once | INTRAMUSCULAR | Status: AC | PRN
  Administered 2015-01-07: 1 mL via EPIDURAL

## 2015-01-07 NOTE — Discharge Instructions (Signed)

## 2015-01-12 ENCOUNTER — Ambulatory Visit
Admission: RE | Admit: 2015-01-12 | Discharge: 2015-01-12 | Disposition: A | Discharge status: Home / Self Care | Payer: Medicare Other | Source: Ambulatory Visit | Attending: Neurology | Admitting: Neurology

## 2015-01-12 DIAGNOSIS — R2689 Other abnormalities of gait and mobility: Secondary | ICD-10-CM | POA: Diagnosis not present

## 2015-01-12 DIAGNOSIS — R2681 Unsteadiness on feet: Secondary | ICD-10-CM

## 2015-01-12 DIAGNOSIS — R42 Dizziness and giddiness: Secondary | ICD-10-CM

## 2015-01-13 ENCOUNTER — Telehealth: Payer: Self-pay

## 2015-01-13 DIAGNOSIS — R42 Dizziness and giddiness: Secondary | ICD-10-CM

## 2015-01-13 NOTE — Telephone Encounter (Signed)
Message relayed to patient. Verbalized understanding and denied questions. Pt states he believes it is improving and would like to hold off on vestibular rehab for now.

## 2015-01-13 NOTE — Telephone Encounter (Signed)
-----   Message from Pieter Partridge, DO sent at 01/13/2015  7:24 AM EST ----- MRI of brain reveals no cause for dizziness.  I have no neurological explanation for symptoms, but if it persists, I would recommend vestibular rehab.

## 2015-01-14 ENCOUNTER — Ambulatory Visit (INDEPENDENT_AMBULATORY_CARE_PROVIDER_SITE_OTHER): Payer: Medicare Other | Admitting: Internal Medicine

## 2015-01-14 ENCOUNTER — Encounter: Payer: Self-pay | Admitting: Internal Medicine

## 2015-01-14 VITALS — BP 124/78 | HR 70 | Temp 97.7°F | Resp 16 | Ht 69.5 in | Wt 203.0 lb

## 2015-01-14 DIAGNOSIS — D692 Other nonthrombocytopenic purpura: Secondary | ICD-10-CM | POA: Diagnosis not present

## 2015-01-14 DIAGNOSIS — H8110 Benign paroxysmal vertigo, unspecified ear: Secondary | ICD-10-CM | POA: Diagnosis not present

## 2015-01-14 NOTE — Progress Notes (Signed)
Subjective:  Patient ID: Keith [name], male    DOB: Aug 27, 1940  Age: 74 y.o. MRN: [id number]  CC: Hypothyroidism   HPI Keith [name] presents for follow-up on hypothyroidism. He complains of persistent episodes of dizziness when he turns his head from side-to-side. He was recently seen by neurology and an MRI was done. The MRI was normal and neurology has found no source for his dizziness. He wants to start therapy for this. He also complains of a couple of red bruises on his left hand and forearm. He is not bleeding from anywhere else.  Outpatient Prescriptions Prior to Visit  Medication Sig Dispense Refill  . aspirin 81 MG EC tablet Take 81 mg by mouth daily.      . fish oil-omega-3 fatty acids 1000 MG capsule Take 1 g by mouth daily.      . fluticasone (FLONASE) 50 MCG/ACT nasal spray instill 2 sprays into each nostril once daily 16 g 11  . levothyroxine (SYNTHROID, LEVOTHROID) 125 MCG tablet Take 1 tablet (125 mcg total) by mouth daily. 90 tablet 1  . Multiple Vitamins-Minerals (MULTIVITAMIN,TX-MINERALS) tablet Take 1 tablet by mouth daily.      . Red Yeast Rice 600 MG TABS       No facility-administered medications prior to visit.    ROS Review of Systems  Constitutional: Negative.  Negative for fever, chills, diaphoresis, appetite change and fatigue.  HENT: Negative.  Negative for nosebleeds.   Eyes: Negative.   Respiratory: Negative.  Negative for cough, choking, chest tightness, shortness of breath and stridor.   Cardiovascular: Negative.  Negative for chest pain, palpitations and leg swelling.  Gastrointestinal: Negative.  Negative for nausea, vomiting, abdominal pain, diarrhea, constipation and blood in stool.  Endocrine: Negative.   Genitourinary: Negative.  Negative for hematuria and difficulty urinating.  Musculoskeletal: Negative.   Skin: Positive for color change. Negative for pallor, rash and wound.  Allergic/Immunologic: Negative.   Neurological: Positive for  dizziness. Negative for syncope, facial asymmetry, speech difficulty, weakness, light-headedness and numbness.  Hematological: Negative for adenopathy. Bruises/bleeds easily.  Psychiatric/Behavioral: Negative.     Objective:  BP 124/78 mmHg  Pulse 70  Temp(Src) 97.7 F (36.5 C) (Oral)  Resp 16  Ht 5' 9.5" (1.765 m)  Wt 203 lb (92.08 kg)  BMI 29.56 kg/m2  SpO2 96%  BP Readings from Last 3 Encounters:  01/14/15 124/78  01/07/15 141/86  12/24/14 100/64    Wt Readings from Last 3 Encounters:  01/14/15 203 lb (92.08 kg)  12/24/14 199 lb (90.266 kg)  12/17/14 199 lb (90.266 kg)    Physical Exam  Constitutional: He is oriented to person, place, and time. No distress.  HENT:  Mouth/Throat: Oropharynx is clear and moist. No oropharyngeal exudate.  Eyes: Conjunctivae are normal. Right eye exhibits no discharge. Left eye exhibits no discharge. No scleral icterus.  Neck: Normal range of motion. Neck supple. No JVD present. No tracheal deviation present. No thyromegaly present.  Cardiovascular: Normal rate, regular rhythm, normal heart sounds and intact distal pulses.  Exam reveals no gallop and no friction rub.   No murmur heard. Pulmonary/Chest: Effort normal and breath sounds normal. No stridor. No respiratory distress. He has no wheezes. He has no rales. He exhibits no tenderness.  Abdominal: Soft. Bowel sounds are normal. He exhibits no distension and no mass. There is no tenderness. There is no rebound and no guarding.  Musculoskeletal: Normal range of motion. He exhibits no edema or tenderness.  Lymphadenopathy:  He has no cervical adenopathy.  Neurological: He is oriented to person, place, and time.  Skin: Skin is warm, dry and intact. Purpura and rash noted. Rash is not macular, not papular, not maculopapular, not nodular, not pustular, not vesicular and not urticarial. He is not diaphoretic. No pallor.     Psychiatric: He has a normal mood and affect. His behavior is  normal. Judgment and thought content normal.    Lab Results  Component Value Date   WBC 10.2 11/04/2014   HGB 16.2 11/04/2014   HCT 48.6 11/04/2014   PLT 179.0 11/04/2014   GLUCOSE 77 12/24/2014   CHOL 172 03/07/2014   TRIG 145.0 03/07/2014   HDL 40.50 03/07/2014   LDLCALC 103* 03/07/2014   ALT 19 05/09/2014   AST 18 05/09/2014   NA 140 12/24/2014   K 4.0 12/24/2014   CL 105 12/24/2014   CREATININE 1.07 12/24/2014   BUN 14 12/24/2014   CO2 29 12/24/2014   TSH 3.36 11/04/2014   PSA 3.82 03/07/2014    [name] Contrast  01/12/2015  CLINICAL DATA:  74 year old male with dizziness for 3 months. Gait instability. Hyperlipidemia. Initial encounter. EXAM: MRI HEAD WITHOUT CONTRAST TECHNIQUE: Multiplanar, multiecho pulse sequences of the brain and surrounding structures were obtained without intravenous contrast. COMPARISON:  None. FINDINGS: No acute infarct or intracranial hemorrhage. Mild to moderate small vessel disease type changes. Atrophy without hydrocephalus. No intracranial mass lesion noted on this unenhanced exam. Major intracranial vascular structures are patent. Cervical medullary junction unremarkable. Mild transverse ligament hypertrophy. Small pituitary gland. Pineal region and orbital structures unremarkable. IMPRESSION: No acute infarct or intracranial hemorrhage. Mild to moderate small vessel disease type changes. Atrophy without hydrocephalus. No intracranial mass lesion noted on this unenhanced exam. Small pituitary gland. Electronically Signed   By: Genia Del M.D.   On: 01/12/2015 20:25    Assessment & Plan:   Rachael was seen today for hypothyroidism.  Diagnoses and all orders for this visit:  Senile purpura ([place])- reassurance offered, he was asked to wear long sleeves and gloves and protect his arms and forearms from injury.  Benign paroxysmal positional vertigo, unspecified laterality- he will start therapy with the vestibular physical therapy program. -      PT Vestibular Evaluation; Future  I am having [name] maintain his aspirin, (multivitamin,tx-minerals), fish oil-omega-3 fatty acids, Red Yeast Rice, fluticasone, and levothyroxine.  No orders of the defined types were placed in this encounter.     Follow-up: Return in about 4 months (around 05/15/2015).  Scarlette Calico, MD

## 2015-01-14 NOTE — Progress Notes (Signed)
Pre visit review using our clinic review tool, if applicable. No additional management support is needed unless otherwise documented below in the visit note. 

## 2015-01-14 NOTE — Patient Instructions (Signed)

## 2015-02-05 ENCOUNTER — Encounter: Payer: Self-pay | Admitting: *Deleted

## 2015-03-02 ENCOUNTER — Encounter: Payer: Self-pay | Admitting: Gastroenterology

## 2015-03-12 ENCOUNTER — Ambulatory Visit (INDEPENDENT_AMBULATORY_CARE_PROVIDER_SITE_OTHER)
Admission: RE | Admit: 2015-03-12 | Discharge: 2015-03-12 | Disposition: A | Discharge status: Home / Self Care | Payer: Medicare Other | Source: Ambulatory Visit | Attending: Family Medicine | Admitting: Family Medicine

## 2015-03-12 ENCOUNTER — Ambulatory Visit (INDEPENDENT_AMBULATORY_CARE_PROVIDER_SITE_OTHER): Payer: Medicare Other | Admitting: Family Medicine

## 2015-03-12 ENCOUNTER — Other Ambulatory Visit: Payer: Self-pay | Admitting: Family Medicine

## 2015-03-12 ENCOUNTER — Encounter: Payer: Self-pay | Admitting: Family Medicine

## 2015-03-12 VITALS — BP 118/84 | HR 82 | Ht 69.5 in | Wt 200.0 lb

## 2015-03-12 DIAGNOSIS — M25551 Pain in right hip: Secondary | ICD-10-CM | POA: Diagnosis not present

## 2015-03-12 DIAGNOSIS — M7631 Iliotibial band syndrome, right leg: Secondary | ICD-10-CM

## 2015-03-12 DIAGNOSIS — M79651 Pain in right thigh: Secondary | ICD-10-CM

## 2015-03-12 DIAGNOSIS — M79604 Pain in right leg: Secondary | ICD-10-CM

## 2015-03-12 MED ORDER — PREDNISONE 20 MG PO TABS: 40.0000 mg | ORAL_TABLET | Freq: Every day | ORAL | Status: DC

## 2015-03-12 NOTE — Progress Notes (Signed)
Pre visit review using our clinic review tool, if applicable. No additional management support is needed unless otherwise documented below in the visit note. 

## 2015-03-12 NOTE — Progress Notes (Signed)
[name] [address], [place] [address] Phone: [phone] Subjective:    CC:  Follow-up on lumbar radiculopathy.  [id number] Keith [name] is a 75 y.o. male coming in with complaint of  back pain. . Patient did have x-rays at last exam that did show the patient does have diffuse degenerative changes with progression of the L2-L3 disc space as well as L4-L5 6 mm anterior listhesis. Patient continued to have difficulty. Patient was sent for an MRI that shows L4-L5 disc degeneration with stenosis as well as right foraminal stenosis and right subarticular stenosis with significant impingement on the L5 descending nerves.   patient did see neurosurgery and did have surgery. Patient is doing much better and no longer having any foot drop.  Patient is complaining now more of a right leg pain. Seems to be on the lateral aspect the leg. States this seems to be tween his hip as well as between his knee. Hurts more when he puts pressure such as going up or downstairs. Even walking regularly. No groin pain with it. Patient states that this is not a nerve pain like his previous injuries. Patient does not think it is associated with his back. No pain at rest. No numbness. Would not state that there is any weakness. Does not seem to get better with activity.  Past medical history, social, surgical and family history all reviewed in electronic medical record.   Review of Systems: No headache, visual changes, nausea, vomiting, diarrhea, constipation, dizziness, abdominal pain, skin rash, fevers, chills, night sweats, weight loss, swollen lymph nodes, body aches, joint swelling, muscle aches, chest pain, shortness of breath, mood changes.   Objective Blood pressure 118/84, pulse 82, height 5' 9.5" (1.765 m), weight 200 lb (90.719 kg), SpO2 97 %.  General: No apparent distress alert and oriented x3 mood and affect normal, dressed appropriately.  HEENT: Pupils equal,  extraocular movements intact  Respiratory: Patient's speak in full sentences and does not appear short of breath  Cardiovascular: No lower extremity edema, non tender, no erythema  Skin: Warm dry intact with no signs of infection or rash on extremities or on axial skeleton.  Abdomen: Soft nontender  Neuro: Cranial nerves II through XII are intact, neurovascularly intact in all extremities but only has +1 Achilles reflex on the right compared to the contralateral side Lymph: No lymphadenopathy of posterior or anterior cervical chain or axillae bilaterally.  Gait  normal gait MSK:  Non tender with full range of motion and good stability and symmetric strength and tone of shoulders, elbows, wrist, hip, knees bilaterally.  Back Exam:  Inspection: Unremarkable  Motion: Flexion 45 deg, Extension 30 deg, Side Bending to 45 deg bilaterally,  Rotation to 35 deg bilaterally  SLR laying: Continued discomfort on right side XSLR laying: Negative  Palpable tenderness: Tender over the distal IT band as well as somewhat over the quadriceps muscle. FABER: Positive bilateral Sensory change: Gross sensation intact to all lumbar and sacral dermatomes.  Reflexes: Flexes 2+ and symmetric which is improved. . Patient does have a positive Corky Sox with discomfort on the lateral aspect the leg. Minimal tenderness to palpation over the greater trochanteric area. No pain with internal rotation or groin pain. Neurovascularly intact distally.  Procedure note [id number]; 15 minutes spent for Therapeutic exercises as stated in above notes.  This included exercises focusing on stretching, strengthening, with significant focus on eccentric aspects.  I reviewed with the patient the anatomy involved with ITB syndrome.  Given rehab program  - primarily ITB stretching program, core stability program, gluteus medius strengthening, hip flexion strengthening, and core. Additional proprioception and mild plyometrics program  reviewed.  Reviewed plan of care and rehab is the primary treatment in this condition.  Proper technique shown and discussed handout in great detail with ATC.  All questions were discussed and answered.    Impression and Recommendations:     This case required medical decision making of moderate complexity.

## 2015-03-12 NOTE — Patient Instructions (Signed)
Good to see you  Sorry I do not have the quick fix pennsaid pinkie amount topically 2 times daily as needed.  Prednisone daily for 5 days  Exercises 3 times a week.  Compression sleeve to thigh can help Lets get xrays downstairs to make sure we are not missing anything .  No news is good news.  Continue the vitamins See me again in 2-3 weeks and if not better we can consider trigger point injections but not great evidence. Marland Kitchen

## 2015-03-12 NOTE — Assessment & Plan Note (Signed)
Patient is having more right thigh pain. Patient describes it as a dull, throbbing aching pain. Patient does not think it is associated to his lumbar radiculopathy but that is still within the differential. Patient has had known arthritic changes of the hips and I do feel that further imaging is necessary to see if there is any referred pain that could be contributing. Patient denies though any significant groin pain. Patient does have signs and symptoms consistent with more of the distal iliotibial band syndrome. Home exercises given, we'll do very short course of anti-inflammatories and topical anti-inflammatories. Prescription sent in today. X-rays of femur to make sure that there is no fracture. Think this is low likelihood. Patient will follow-up again in 2-3 weeks. If continuing have pain we'll consider trigger point injections. 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  Spent  25 minutes with patient face-to-face and had greater than 50% of counseling including as described above in assessment and plan.

## 2015-03-18 ENCOUNTER — Ambulatory Visit: Payer: Medicare Other | Admitting: Family Medicine

## 2015-07-27 ENCOUNTER — Telehealth: Payer: Self-pay | Admitting: Internal Medicine

## 2015-07-27 NOTE — Telephone Encounter (Signed)
No, I don't.

## 2015-07-27 NOTE — Telephone Encounter (Signed)
Pt informed

## 2015-07-27 NOTE — Telephone Encounter (Signed)
Patient is requesting a steroid injection in his hand. No availability with sports med for a few weeks. His question is can you do these injections?

## 2015-08-31 ENCOUNTER — Ambulatory Visit: Payer: Medicare Other | Admitting: Family Medicine

## 2015-09-02 NOTE — Progress Notes (Signed)
CC: Left thumb pain follow-up  HPI: 75 yo male coming in for pain in the left thumb.patient was found to have [place] arthritis. Was given injection Months ago. Patient states tone is doing well. Patient did overextend his third finger on the left hand. States since then has had some decrease in range of motion. States some swelling of the knuckle. Patient states that it just seems to be getting in his way. Has difficulty with closing it  Mild weakness but no numbness.  Past Medical History:  Diagnosis Date  . Allergic rhinitis   . DDD (degenerative disc disease), cervical   . Hyperlipidemia   . Hypothyroidism    Past Surgical History:  Procedure Laterality Date  . BACK SURGERY  2016  . TONSILLECTOMY     Social History  Substance Use Topics  . Smoking status: Never Smoker  . Smokeless tobacco: Never Used  . Alcohol use No   Allergies  Allergen Reactions  . Atorvastatin Other (See Comments)    leg cramps  . Rosuvastatin Other (See Comments)    myalgias   Family History  Problem Relation Age of Onset  . Coronary artery disease Father     Fatal MI  . Coronary artery disease Other   . Breast cancer Other   . Diabetes Father   . Diabetes Mother      Past medical, surgical, family and social history reviewed. Medications reviewed all in the electronic medical record.  Review of Systems: No headache, visual changes, nausea, vomiting, diarrhea, constipation, dizziness, abdominal pain, skin rash, fevers, chills, night sweats, weight loss, swollen lymph nodes, body aches, joint swelling, muscle aches, chest pain, shortness of breath, mood changes.   Objective:    Blood pressure 114/80, pulse 72, weight 202 lb (91.6 kg), SpO2 97 %.   General: No apparent distress alert and oriented x3 mood and affect normal, dressed appropriately.  HEENT: Pupils equal, extraocular movements intact Respiratory: Patient's speak in full sentences and does not appear short of breath Cardiovascular:  No lower extremity edema, non tender, no erythema Skin: Warm dry intact with no signs of infection or rash on extremities or on axial skeleton. Abdomen: Soft nontender Neuro: Cranial nerves II through XII are intact, neurovascularly intact in all extremities with 2+ DTRs and 2+ pulses. Lymph: No lymphadenopathy of posterior or anterior cervical chain or axillae bilaterally.  Gait normal with good balance and coordination.  MSK: Non tender with full range of motion and good stability and symmetric strength and tone of the hip, shoulders, elbows,  knee and ankles bilaterally.  Wrist: Left Inspection normal with no visible erythema or swelling. ROM smooth and normal with good flexion and extension and ulnar/radial deviation that is symmetrical with opposite wrist. No snuffbox tenderness. No tenderness over [place]. Strength 5/5 in all directions without pain. Negative Finkelstein, tinel's and phalens. Negative Watson's test. Pain and visual swelling over 3rd MCP.  Mild limtation in ROM.       Procedure: Real-time Ultrasound Guided Injection of the left 3rd MCP Device: GE Logiq E  Ultrasound guided injection is preferred based studies that show increased duration, increased effect, greater accuracy, decreased procedural pain, increased response rate with ultrasound guided versus blind injection.  Verbal informed consent obtained.  Time-out conducted.  Noted no overlying erythema, induration, or other signs of local infection.  Skin prepped in a sterile fashion.  Local anesthesia: Topical Ethyl chloride.  Patient then under ultrasound did have [place] joint localized. Patient then had 25-gauge  1 inch needle injected into the [facility] joint and had 0.5 cc of 0.5% Marcaine and 0.5 cc of Kenalog 40 mg/dL. Completed without difficulty  Pain immediately resolved suggesting accurate placement of the medication.  Advised to call if fevers/chills, erythema, induration, drainage, or persistent  bleeding.  Images permanently stored and available for review in the ultrasound unit.  Impression: Technically successful ultrasound guided injection.  Impression and Recommendations:     This case required medical decision making of moderate complexity.

## 2015-09-03 ENCOUNTER — Encounter: Payer: Self-pay | Admitting: Family Medicine

## 2015-09-03 ENCOUNTER — Other Ambulatory Visit: Payer: Self-pay

## 2015-09-03 ENCOUNTER — Ambulatory Visit (INDEPENDENT_AMBULATORY_CARE_PROVIDER_SITE_OTHER): Payer: Medicare Other | Admitting: Family Medicine

## 2015-09-03 VITALS — BP 114/80 | HR 72 | Wt 202.0 lb

## 2015-09-03 DIAGNOSIS — M79642 Pain in left hand: Secondary | ICD-10-CM

## 2015-09-03 DIAGNOSIS — M65949 Unspecified synovitis and tenosynovitis, unspecified hand: Secondary | ICD-10-CM

## 2015-09-03 DIAGNOSIS — M659 Synovitis and tenosynovitis, unspecified: Secondary | ICD-10-CM

## 2015-09-03 NOTE — Patient Instructions (Signed)
Good to see you  As always Ice 20 minutes 2 times daily. Usually after activity and before bed. Should be good in the next week.  See me when you need me

## 2015-09-03 NOTE — Assessment & Plan Note (Signed)
Likely synovitis of finger.  Discussed icing  HEP Bracing RTC in 3-4 weeks If worse will need xray.

## 2015-09-24 ENCOUNTER — Other Ambulatory Visit: Payer: Self-pay | Admitting: Internal Medicine

## 2015-09-24 DIAGNOSIS — E038 Other specified hypothyroidism: Secondary | ICD-10-CM

## 2015-10-01 ENCOUNTER — Encounter: Payer: Self-pay | Admitting: Internal Medicine

## 2015-10-01 ENCOUNTER — Other Ambulatory Visit (INDEPENDENT_AMBULATORY_CARE_PROVIDER_SITE_OTHER): Payer: Medicare Other

## 2015-10-01 ENCOUNTER — Ambulatory Visit (INDEPENDENT_AMBULATORY_CARE_PROVIDER_SITE_OTHER): Payer: Medicare Other | Admitting: Internal Medicine

## 2015-10-01 VITALS — BP 130/80 | HR 61 | Temp 97.9°F | Resp 16 | Ht 69.5 in | Wt 200.0 lb

## 2015-10-01 DIAGNOSIS — E038 Other specified hypothyroidism: Secondary | ICD-10-CM | POA: Diagnosis not present

## 2015-10-01 DIAGNOSIS — E785 Hyperlipidemia, unspecified: Secondary | ICD-10-CM

## 2015-10-01 DIAGNOSIS — N402 Nodular prostate without lower urinary tract symptoms: Secondary | ICD-10-CM

## 2015-10-01 DIAGNOSIS — E039 Hypothyroidism, unspecified: Secondary | ICD-10-CM

## 2015-10-01 DIAGNOSIS — Z23 Encounter for immunization: Secondary | ICD-10-CM | POA: Diagnosis not present

## 2015-10-01 LAB — PSA: PSA: 3.88 ng/mL (ref 0.10–4.00)

## 2015-10-01 LAB — LIPID PANEL
CHOL/HDL RATIO: 4
Cholesterol: 179 mg/dL (ref 0–200)
HDL: 43.4 mg/dL (ref >39.00)
LDL CALC: 115 mg/dL — AB (ref 0–99)
NONHDL: 135.32
Triglycerides: 100 mg/dL (ref 0.0–149.0)
VLDL: 20 mg/dL (ref 0.0–40.0)

## 2015-10-01 LAB — COMPREHENSIVE METABOLIC PANEL
ALK PHOS: 44 U/L (ref 39–117)
ALT: 19 U/L (ref 0–53)
AST: 21 U/L (ref 0–37)
Albumin: 4 g/dL (ref 3.5–5.2)
BUN: 17 mg/dL (ref 6–23)
CHLORIDE: 105 meq/L (ref 96–112)
CO2: 28 meq/L (ref 19–32)
Calcium: 8.8 mg/dL (ref 8.4–10.5)
Creatinine, Ser: 1.17 mg/dL (ref 0.40–1.50)
GFR: 64.76 mL/min (ref >60.00)
GLUCOSE: 101 mg/dL — AB (ref 70–99)
POTASSIUM: 4.1 meq/L (ref 3.5–5.1)
Sodium: 140 mEq/L (ref 135–145)
TOTAL PROTEIN: 6.6 g/dL (ref 6.0–8.3)
Total Bilirubin: 0.9 mg/dL (ref 0.2–1.2)

## 2015-10-01 LAB — TSH: TSH: 3.51 u[IU]/mL (ref 0.35–4.50)

## 2015-10-01 MED ORDER — LEVOTHYROXINE SODIUM 125 MCG PO TABS: 125.0000 ug | ORAL_TABLET | Freq: Every day | ORAL | 1 refills | Status: DC

## 2015-10-01 NOTE — Progress Notes (Signed)
Pre visit review using our clinic review tool, if applicable. No additional management support is needed unless otherwise documented below in the visit note. 

## 2015-10-01 NOTE — Patient Instructions (Signed)

## 2015-10-01 NOTE — Progress Notes (Signed)
Subjective:  Patient ID: Keith [name], male    DOB: March 19, 1940  Age: 75 y.o. MRN: [id number]  CC: Hypothyroidism   HPI Keith [name] presents for a f/up on hypothyroidism. He feels well and offers no complaints.  Outpatient Medications Prior to Visit  Medication Sig Dispense Refill  . aspirin 81 MG EC tablet Take 81 mg by mouth daily.      . fish oil-omega-3 fatty acids 1000 MG capsule Take 1 g by mouth daily.      . fluticasone (FLONASE) 50 MCG/ACT nasal spray instill 2 sprays into each nostril once daily 16 g 11  . Multiple Vitamins-Minerals (MULTIVITAMIN,TX-MINERALS) tablet Take 1 tablet by mouth daily.      . Red Yeast Rice 600 MG TABS      . levothyroxine (SYNTHROID, LEVOTHROID) 125 MCG tablet Take 1 tablet (125 mcg total) by mouth daily. 90 tablet 1  . predniSONE (DELTASONE) 20 MG tablet Take 2 tablets (40 mg total) by mouth daily with breakfast. 10 tablet 0   No facility-administered medications prior to visit.     ROS Review of Systems  Constitutional: Negative for appetite change, diaphoresis and fatigue.  HENT: Negative.  Negative for trouble swallowing.   Eyes: Negative.  Negative for visual disturbance.  Respiratory: Negative.  Negative for cough, choking, chest tightness, shortness of breath and stridor.   Cardiovascular: Negative.  Negative for chest pain, palpitations and leg swelling.  Gastrointestinal: Negative.  Negative for constipation, diarrhea, nausea and vomiting.  Endocrine: Negative.   Genitourinary: Negative.  Negative for difficulty urinating, dysuria and urgency.  Musculoskeletal: Negative.  Negative for back pain and myalgias.  Skin: Negative.   Allergic/Immunologic: Negative.   Neurological: Negative.  Negative for dizziness and weakness.  Hematological: Negative.  Negative for adenopathy. Does not bruise/bleed easily.  Psychiatric/Behavioral: Negative.     Objective:  BP 130/80 (BP Location: Left Arm, Patient Position: Sitting, Cuff Size:  Normal)   Pulse 61   Temp 97.9 F (36.6 C) (Oral)   Resp 16   Ht 5' 9.5" (1.765 m)   Wt 200 lb (90.7 kg)   SpO2 97%   BMI 29.11 kg/m   BP Readings from Last 3 Encounters:  10/01/15 130/80  09/03/15 114/80  03/12/15 118/84    Wt Readings from Last 3 Encounters:  10/01/15 200 lb (90.7 kg)  09/03/15 202 lb (91.6 kg)  03/12/15 200 lb (90.7 kg)    Physical Exam  Constitutional: He is oriented to person, place, and time. No distress.  HENT:  Mouth/Throat: Oropharynx is clear and moist. No oropharyngeal exudate.  Eyes: Conjunctivae are normal. Right eye exhibits no discharge. Left eye exhibits no discharge. No scleral icterus.  Neck: Normal range of motion. Neck supple. No JVD present. No tracheal deviation present. No thyromegaly present.  Cardiovascular: Normal rate, regular rhythm, normal heart sounds and intact distal pulses.  Exam reveals no gallop and no friction rub.   No murmur heard. Pulmonary/Chest: Effort normal and breath sounds normal. No stridor. No respiratory distress. He has no wheezes. He has no rales. He exhibits no tenderness.  Abdominal: Soft. Bowel sounds are normal. He exhibits no distension and no mass. There is no tenderness. There is no rebound and no guarding.  Musculoskeletal: Normal range of motion. He exhibits no edema, tenderness or deformity.  Lymphadenopathy:    He has no cervical adenopathy.  Neurological: He is oriented to person, place, and time.  Skin: Skin is warm and dry. No rash noted. He is  not diaphoretic. No erythema. No pallor.  Vitals reviewed.   Lab Results  Component Value Date   WBC 10.2 11/04/2014   HGB 16.2 11/04/2014   HCT 48.6 11/04/2014   PLT 179.0 11/04/2014   GLUCOSE 101 (H) 10/01/2015   CHOL 179 10/01/2015   TRIG 100.0 10/01/2015   HDL 43.40 10/01/2015   LDLCALC 115 (H) 10/01/2015   ALT 19 10/01/2015   AST 21 10/01/2015   NA 140 10/01/2015   K 4.1 10/01/2015   CL 105 10/01/2015   CREATININE 1.17 10/01/2015    BUN 17 10/01/2015   CO2 28 10/01/2015   TSH 3.51 10/01/2015   PSA 3.88 10/01/2015    Dg Hip Unilat With Pelvis 2-3 Views Right  Result Date: 03/12/2015 CLINICAL DATA:  Right hip and knee pain 1 locking over the last 3 weeks. EXAM: DG HIP (WITH OR WITHOUT PELVIS) 2-3V RIGHT COMPARISON:  None. FINDINGS: Lower lumbar degenerative disc disease. Articular space in the right hip is preserved. No fracture is identified. No acute bony findings identified. IMPRESSION: 1. Lower lumbar degenerative disc disease. 2. Preserved articular space in the right hip without acute bony abnormality to explain the patient's new symptoms. Electronically Signed   By: Van Clines M.D.   On: 03/12/2015 09:01   Dg Femur, Min 2 Views Right  Result Date: 03/12/2015 CLINICAL DATA:  Right hip and knee pain when walking over the past 3 weeks. EXAM: RIGHT FEMUR 2 VIEWS COMPARISON:  None. FINDINGS: Vascular calcification noted. No fracture or acute bony findings. No definite knee effusion in the suprapatellar bursa. IMPRESSION: 1. Vascular calcifications. No acute bony findings to explain the patient's right hip and knee pain. Electronically Signed   By: Van Clines M.D.   On: 03/12/2015 09:00    Assessment & Plan:   Keith [name] was seen today for hypothyroidism.  Diagnoses and all orders for this visit:  Hypothyroidism, unspecified hypothyroidism type- His TSH is in the normal range and he feels well, we'll continue the current dose of levothyroxine. -     TSH; Future -     Comprehensive metabolic panel; Future  Hyperlipidemia with target LDL less than 130- his Framingham risk score is 16% but he is not willing to take a statin for risk reduction. -     Lipid panel; Future -     Comprehensive metabolic panel; Future  Prostate nodule- his PSA is not rising so I'm not concerned about prostate cancer, he offers no symptoms that need to be treated. -     PSA; Future  Need for prophylactic vaccination and  inoculation against influenza -     Flu Vaccine QUAD 36+ mos IM  Other specified hypothyroidism -     levothyroxine (SYNTHROID, LEVOTHROID) 125 MCG tablet; Take 1 tablet (125 mcg total) by mouth daily.   I have discontinued Mr. Keith [name]'s predniSONE. I am also having him maintain his aspirin, (multivitamin,tx-minerals), fish oil-omega-3 fatty acids, Red Yeast Rice, fluticasone, and levothyroxine.  Meds ordered this encounter  Medications  . levothyroxine (SYNTHROID, LEVOTHROID) 125 MCG tablet    Sig: Take 1 tablet (125 mcg total) by mouth daily.    Dispense:  90 tablet    Refill:  1     Follow-up: Return in about 6 months (around 04/02/2016).  Scarlette Calico, MD

## 2015-10-15 ENCOUNTER — Other Ambulatory Visit: Payer: Self-pay | Admitting: Internal Medicine

## 2015-12-30 ENCOUNTER — Other Ambulatory Visit: Payer: Self-pay | Admitting: *Deleted

## 2015-12-30 DIAGNOSIS — M5416 Radiculopathy, lumbar region: Secondary | ICD-10-CM

## 2016-01-13 ENCOUNTER — Ambulatory Visit
Admission: RE | Admit: 2016-01-13 | Discharge: 2016-01-13 | Disposition: A | Discharge status: Home / Self Care | Payer: Medicare Other | Source: Ambulatory Visit | Attending: Family Medicine | Admitting: Family Medicine

## 2016-01-13 DIAGNOSIS — M545 Low back pain: Secondary | ICD-10-CM | POA: Diagnosis not present

## 2016-01-13 DIAGNOSIS — M5416 Radiculopathy, lumbar region: Secondary | ICD-10-CM

## 2016-01-13 MED ORDER — METHYLPREDNISOLONE ACETATE 40 MG/ML INJ SUSP (RADIOLOG
120.0000 mg | Freq: Once | INTRAMUSCULAR | Status: AC
  Administered 2016-01-13: 120 mg via EPIDURAL

## 2016-01-13 MED ORDER — IOPAMIDOL (ISOVUE-M 200) INJECTION 41%
1.0000 mL | Freq: Once | INTRAMUSCULAR | Status: AC
  Administered 2016-01-13: 1 mL via EPIDURAL

## 2016-01-13 NOTE — Discharge Instructions (Signed)

## 2016-01-14 ENCOUNTER — Other Ambulatory Visit: Payer: Self-pay

## 2016-01-19 ENCOUNTER — Telehealth: Payer: Self-pay | Admitting: Family Medicine

## 2016-01-19 NOTE — Telephone Encounter (Signed)
Please give the patient a call regarding his hamstring. He is looking for advice until his appt next week

## 2016-01-19 NOTE — Progress Notes (Signed)
[name] [place], [place] [address] Phone: [phone] Subjective:      CC: fell and hurt hamstring.   [id number]  Keith [name] is a 75 y.o. male coming in with complaint of right hamstring.  Fell 2 days ago, tripped, severe pain near buttocks, no radiation of pain.  Cramping feeling.  Unable to walk normal.  No numbness, no back pain, severe overall. Did wake him at night, no brusing. Affecting daily activities.  Had similar presentation many years ago on the other side.        Past Medical History:  Diagnosis Date  . Allergic rhinitis   . DDD (degenerative disc disease), cervical   . Hyperlipidemia   . Hypothyroidism    Past Surgical History:  Procedure Laterality Date  . BACK SURGERY  2016  . TONSILLECTOMY     Social History   Social History  . Marital status: Married    Spouse name: N/A  . Number of children: N/A  . Years of education: N/A   Occupational History  . 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 Smoking status: Never Smoker  . Smokeless tobacco: Never Used  . Alcohol use No  . Drug use: No  . Sexual activity: Not Currently   Other Topics Concern  . None   Social History Narrative   Regular Exercise -  YES         Allergies  Allergen Reactions  . Atorvastatin Other (See Comments)    leg cramps  . Rosuvastatin Other (See Comments)    myalgias   Family History  Problem Relation Age of Onset  . Coronary artery disease Father     Fatal MI  . Coronary artery disease Other   . Breast cancer Other   . Diabetes Father   . Diabetes Mother     Past medical history, social, surgical and family history all reviewed in electronic medical record.  No pertanent information unless stated regarding to the chief complaint.   Review of Systems: No headache, visual changes, nausea, vomiting, diarrhea, constipation, dizziness, abdominal pain, skin  rash, fevers, chills, night sweats, weight loss, swollen lymph nodes, body aches, joint swelling, muscle aches, chest pain, shortness of breath, mood changes.  .   Objective  Blood pressure 126/78, pulse 69, height 5\' 10"  (1.778 m), weight 205 lb (93 kg), SpO2 97 %. Systems examined below as of 01/20/16   General: No apparent distress alert and oriented x3 mood and affect normal, dressed appropriately.  HEENT: Pupils equal, extraocular movements intact  Respiratory: Patient's speak in full sentences and does not appear short of breath  Cardiovascular: No lower extremity edema, non tender, no erythema  Skin: Warm dry intact with no signs of infection or rash on extremities or on axial skeleton.  Abdomen: Soft nontender  Neuro: Cranial nerves II through XII are intact, neurovascularly intact in all extremities with 2+ DTRs and 2+ pulses.  Lymph: No lymphadenopathy of posterior or anterior cervical chain or axillae bilaterally.  Gait Antalgic gait MSK:  Non tender with full range of motion and good stability and symmetric strength and tone of shoulders, elbows, wrist, hip, knee and ankles bilaterally. 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  Patient's right hamstring does have weakness of 4 out of 5 strength compared to the contralateral side. Significant pain at the origin as well  as mid substance of the muscle. No palpable defect noted. Mild swelling noted but no bruising. Mild discomfort in the lower back. Negative straight leg test but does have tightness of the hamstring.  Limited muscular skeletal ultrasound was performed and interpreted by Lyndal Pulley  Limited ultrasound the patient's right hamstring shows the patient does have a partial tear noted right at the tendon muscular juncture origin. No significant retraction noted. No avulsion fracture noted.  Impression: Hamstring tear   Impression and Recommendations:     This case  required medical decision making of moderate complexity.      Note: This dictation was prepared with Dragon dictation along with smaller phrase technology. Any transcriptional errors that result from this process are unintentional.

## 2016-01-19 NOTE — Telephone Encounter (Signed)
Spoke to pt, dr Tamala Julian had a cancellation. Rescheduled pt for 12.13.17 @ 11am.

## 2016-01-20 ENCOUNTER — Ambulatory Visit (INDEPENDENT_AMBULATORY_CARE_PROVIDER_SITE_OTHER): Payer: Medicare Other | Admitting: Family Medicine

## 2016-01-20 ENCOUNTER — Ambulatory Visit: Payer: Self-pay

## 2016-01-20 ENCOUNTER — Encounter: Payer: Self-pay | Admitting: Family Medicine

## 2016-01-20 VITALS — BP 126/78 | HR 69 | Ht 70.0 in | Wt 205.0 lb

## 2016-01-20 DIAGNOSIS — M79604 Pain in right leg: Secondary | ICD-10-CM | POA: Diagnosis not present

## 2016-01-20 DIAGNOSIS — S76311A Strain of muscle, fascia and tendon of the posterior muscle group at thigh level, right thigh, initial encounter: Secondary | ICD-10-CM

## 2016-01-20 MED ORDER — TRAMADOL HCL 50 MG PO TABS
50.0000 mg | ORAL_TABLET | Freq: Two times a day (BID) | ORAL | 0 refills | Status: DC | PRN
Start: 2016-01-20 — End: 2017-10-13

## 2016-01-20 NOTE — Patient Instructions (Signed)
Good to see you  Sorry for the bad news Try to wear the compression daily but not at night Ice 20 minutes 2 times daily. Usually after activity and before bed. pennsaid pinkie amount topically 2 times daily as needed.   Tramadol up to 2 times a day for pain  See me again in 2 weeks and we will start exercises

## 2016-01-20 NOTE — Assessment & Plan Note (Signed)
Patient does have a hamstring tear. Noted on ultrasound. We'll follow-up again in 2 weeks. We discussed compression, icing, as well as anti-inflammatory use. Tramadol prescription given. Follow-up again in 2 weeks.

## 2016-01-27 ENCOUNTER — Ambulatory Visit: Payer: Medicare Other | Admitting: Family Medicine

## 2016-02-02 NOTE — Progress Notes (Signed)
[name] [place], [place] [address] Phone: [phone] Subjective:      CC: fell and hurt hamstring f/u  [id number]  Keith [name] is a 75 y.o. male coming in with complaint of right hamstring.  Patient was seen 2 weeks ago and did have a hamstring tear. Patient states that he is no longer having any pain. Does notice some mild weakness on this side. Denies any numbness though. Patient states that he can do daily activities without any pain is a longer waking him up at night.      Past Medical History:  Diagnosis Date  . Allergic rhinitis   . DDD (degenerative disc disease), cervical   . Hyperlipidemia   . Hypothyroidism    Past Surgical History:  Procedure Laterality Date  . BACK SURGERY  2016  . TONSILLECTOMY     Social History   Social History  . Marital status: Married    Spouse name: N/A  . Number of children: N/A  . Years of education: N/A   Occupational History  . 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 Smoking status: Never Smoker  . Smokeless tobacco: Never Used  . Alcohol use No  . Drug use: No  . Sexual activity: Not Currently   Other Topics Concern  . Not on file   Social History Narrative   Regular Exercise -  YES         Allergies  Allergen Reactions  . Atorvastatin Other (See Comments)    leg cramps  . Rosuvastatin Other (See Comments)    myalgias   Family History  Problem Relation Age of Onset  . Coronary artery disease Father     Fatal MI  . Coronary artery disease Other   . Breast cancer Other   . Diabetes Father   . Diabetes Mother     Past medical history, social, surgical and family history all reviewed in electronic medical record.  No pertanent information unless stated regarding to the chief complaint.   Review of Systems: No headache, visual changes, nausea, vomiting, diarrhea, constipation, dizziness, abdominal pain,  skin rash, fevers, chills, night sweats, weight loss, swollen lymph nodes, body aches, joint swelling, muscle aches, chest pain, shortness of breath, mood changes.   .   Objective  There were no vitals taken for this visit. Systems examined below as of 02/02/16   General: No apparent distress alert and oriented x3 mood and affect normal, dressed appropriately.  HEENT: Pupils equal, extraocular movements intact  Respiratory: Patient's speak in full sentences and does not appear short of breath  Cardiovascular: No lower extremity edema, non tender, no erythema  Skin: Warm dry intact with no signs of infection or rash on extremities or on axial skeleton.  Abdomen: Soft nontender  Neuro: Cranial nerves II through XII are intact, neurovascularly intact in all extremities with 2+ DTRs and 2+ pulses.  Lymph: No lymphadenopathy of posterior or anterior cervical chain or axillae bilaterally.  Gait Antalgic gait MSK:  Non tender with full range of motion and good stability and symmetric strength and tone of shoulders, elbows, wrist, hip, knee and ankles bilaterally. 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  Right hamstring that show 4+ out of 5 strength. This is improved from previous exam. No palpable defect noted. Still some mild tenderness at the ischial tuberosity but not as  severe as what it was previously.    Impression and Recommendations:     This case required medical decision making of moderate complexity.      Note: This dictation was prepared with Dragon dictation along with smaller phrase technology. Any transcriptional errors that result from this process are unintentional.

## 2016-02-03 ENCOUNTER — Ambulatory Visit (INDEPENDENT_AMBULATORY_CARE_PROVIDER_SITE_OTHER): Payer: Medicare Other | Admitting: Family Medicine

## 2016-02-03 ENCOUNTER — Encounter: Payer: Self-pay | Admitting: Family Medicine

## 2016-02-03 DIAGNOSIS — S76311A Strain of muscle, fascia and tendon of the posterior muscle group at thigh level, right thigh, initial encounter: Secondary | ICD-10-CM

## 2016-02-03 NOTE — Patient Instructions (Signed)
God to see you  For your son look at a playmaker brace or knee stability brace.  See me again in 4 weeks Exercises 3 times a week.  Happy New Year!

## 2016-02-03 NOTE — Assessment & Plan Note (Signed)
Improving at this time. Discussed icing and home exercises. Discussed which activities to do in which ones to avoid. Follow-up again in 4 weeks.

## 2016-03-03 ENCOUNTER — Ambulatory Visit (INDEPENDENT_AMBULATORY_CARE_PROVIDER_SITE_OTHER): Payer: Medicare Other | Admitting: Family Medicine

## 2016-03-03 ENCOUNTER — Encounter: Payer: Self-pay | Admitting: Family Medicine

## 2016-03-03 DIAGNOSIS — S76311A Strain of muscle, fascia and tendon of the posterior muscle group at thigh level, right thigh, initial encounter: Secondary | ICD-10-CM | POA: Diagnosis not present

## 2016-03-03 MED ORDER — GABAPENTIN 100 MG PO CAPS: 200.0000 mg | ORAL_CAPSULE | Freq: Every day | ORAL | 3 refills | Status: DC

## 2016-03-03 NOTE — Progress Notes (Signed)
[name] [place],  [address] Phone: [phone] Subjective:     CC: Right leg pain  [id number]  Keith [name] is a 76 y.o. male coming in with complaint of right leg pain. Patient has had difficulty previously with herniated disc. Patient also was found to have a hamstring tear. Patient has been doing fairly well but not doing the home exercises. Patient states that it's getting better. Certain times when he takes a large stride he has a severe sharp pain in the leg. Denies though any significant weakness except states that sometimes can make him feel like his leg is unsteady. Has had difficulty from time to time lifting his right foot. Had 2 days of pain and swelling in his foot that completely resolved and he thinks it was when he was shoveling snow. States difficult to assess and is now back to his baseline.     Past Medical History:  Diagnosis Date  . Allergic rhinitis   . DDD (degenerative disc disease), cervical   . Hyperlipidemia   . Hypothyroidism    Past Surgical History:  Procedure Laterality Date  . BACK SURGERY  2016  . TONSILLECTOMY     Social History   Social History  . Marital status: Married    Spouse name: N/A  . Number of children: N/A  . Years of education: N/A   Occupational History  . 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 Smoking status: Never Smoker  . Smokeless tobacco: Never Used  . Alcohol use No  . Drug use: No  . Sexual activity: Not Currently   Other Topics Concern  . None   Social History Narrative   Regular Exercise -  YES         Allergies  Allergen Reactions  . Atorvastatin Other (See Comments)    leg cramps  . Rosuvastatin Other (See Comments)    myalgias   Family History  Problem Relation Age of Onset  . Coronary artery disease Father     Fatal MI  . Coronary artery disease Other   . Breast cancer Other   .  Diabetes Father   . Diabetes Mother     Past medical history, social, surgical and family history all reviewed in electronic medical record.  No pertanent information unless stated regarding to the chief complaint.   Review of Systems: No headache, visual changes, nausea, vomiting, diarrhea, constipation, dizziness, abdominal pain, skin rash, fevers, chills, night sweats, weight loss, swollen lymph nodes,chest pain, shortness of breath, mood changes.    Objective  Blood pressure 126/80, pulse (!) 103, weight 208 lb (94.3 kg), SpO2 100 %. Systems examined below as of 03/03/16   General: No apparent distress alert and oriented x3 mood and affect normal, dressed appropriately.  HEENT: Pupils equal, extraocular movements intact  Respiratory: Patient's speak in full sentences and does not appear short of breath  Cardiovascular: No lower extremity edema, non tender, no erythema  Skin: Warm dry intact with no signs of infection or rash on extremities or on axial skeleton.  Abdomen: Soft nontender  Neuro: Cranial nerves II through XII are intact, neurovascularly intact in all extremities with 2+ DTRs and 2+ pulses.  Lymph: No lymphadenopathy of posterior or anterior cervical chain or axillae bilaterally.  Gait mild antalgic gait MSK:  Non tender with full range of motion and good stability and symmetric strength and tone  of shoulders, elbows, wrist, hip, knee and ankles bilaterally. Patient has numerous amputations of multiple fingers.  Still having some mild weakness of the hamstring compared to contralateral side. Mild atrophy of the musculature of the right leg compared to contralateral side. Neurovascularly intact distally. Full strength of the foot today.    Impression and Recommendations:     This case required medical decision making of moderate complexity.      Note: This dictation was prepared with Dragon dictation along with smaller phrase technology. Any transcriptional errors  that result from this process are unintentional.

## 2016-03-03 NOTE — Assessment & Plan Note (Addendum)
Patient is making strides. No significant weakness noted today but patient still has atrophy of the musculature compared to the contralateral side. Differential includes a lumbar radiculopathy. We discussed with patient to monitor this closely. If a trapezoidal more regular basis he needs to seek medical attention immediately. We discussed icing regimen. Discussed which activities to do in which ones to avoid. Encourage him to do the exercises on a more regular basis. Gabapentin given to take at night. Patient come back and see me again if any worsening symptoms.'  Spent  25 minutes with patient face-to-face and had greater than 50% of counseling including as described above in assessment and plan.

## 2016-03-03 NOTE — Patient Instructions (Addendum)
Good to see you  Thanks for bringing your better half.

## 2016-03-24 ENCOUNTER — Encounter: Payer: Self-pay | Admitting: Internal Medicine

## 2016-03-24 ENCOUNTER — Other Ambulatory Visit (INDEPENDENT_AMBULATORY_CARE_PROVIDER_SITE_OTHER): Payer: Medicare Other

## 2016-03-24 ENCOUNTER — Ambulatory Visit (INDEPENDENT_AMBULATORY_CARE_PROVIDER_SITE_OTHER): Payer: Medicare Other | Admitting: Internal Medicine

## 2016-03-24 VITALS — BP 128/88 | HR 73 | Temp 98.1°F | Resp 16 | Ht 70.0 in | Wt 209.0 lb

## 2016-03-24 DIAGNOSIS — E039 Hypothyroidism, unspecified: Secondary | ICD-10-CM | POA: Diagnosis not present

## 2016-03-24 DIAGNOSIS — E785 Hyperlipidemia, unspecified: Secondary | ICD-10-CM | POA: Diagnosis not present

## 2016-03-24 DIAGNOSIS — E038 Other specified hypothyroidism: Secondary | ICD-10-CM | POA: Diagnosis not present

## 2016-03-24 LAB — TSH: TSH: 2.21 u[IU]/mL (ref 0.35–4.50)

## 2016-03-24 MED ORDER — LEVOTHYROXINE SODIUM 125 MCG PO TABS: 125.0000 ug | ORAL_TABLET | Freq: Every day | ORAL | 1 refills | Status: DC

## 2016-03-24 NOTE — Progress Notes (Signed)
Pre visit review using our clinic review tool, if applicable. No additional management support is needed unless otherwise documented below in the visit note. 

## 2016-03-24 NOTE — Progress Notes (Signed)
Subjective:  Patient ID: Keith [name], male    DOB: [birth year]  Age: 76 y.o. MRN: [id number]  CC: Hypothyroidism   HPI Keith [name] presents for Follow-up on hypothyroidism. He complains of a 4 pound weight gain over the last month but otherwise  Outpatient Medications Prior to Visit  Medication Sig Dispense Refill  . aspirin 81 MG EC tablet Take 81 mg by mouth daily.      . fluticasone (FLONASE) 50 MCG/ACT nasal spray instill 2 sprays into each nostril once daily 16 g 11  . Red Yeast Rice 600 MG TABS      . traMADol (ULTRAM) 50 MG tablet Take 1 tablet (50 mg total) by mouth every 12 (twelve) hours as needed. 60 tablet 0  . levothyroxine (SYNTHROID, LEVOTHROID) 125 MCG tablet Take 1 tablet (125 mcg total) by mouth daily. 90 tablet 1  . Multiple Vitamins-Minerals (MULTIVITAMIN,TX-MINERALS) tablet Take 1 tablet by mouth daily.      . fish oil-omega-3 fatty acids 1000 MG capsule Take 1 g by mouth daily.      Marland Kitchen gabapentin (NEURONTIN) 100 MG capsule Take 2 capsules (200 mg total) by mouth at bedtime. (Patient not taking: Reported on 03/24/2016) 60 capsule 3   No facility-administered medications prior to visit.     ROS Review of Systems  Constitutional: Positive for unexpected weight change. Negative for appetite change, chills and fatigue.  HENT: Negative.  Negative for trouble swallowing.   Respiratory: Negative for cough, chest tightness, shortness of breath, wheezing and stridor.   Cardiovascular: Negative for chest pain, palpitations and leg swelling.  Gastrointestinal: Negative.  Negative for abdominal pain, constipation, diarrhea, nausea and vomiting.  Endocrine: Negative.  Negative for cold intolerance and heat intolerance.  Genitourinary: Negative.   Musculoskeletal: Negative for arthralgias, back pain and myalgias.  Skin: Negative.   Allergic/Immunologic: Negative.   Neurological: Negative.   Hematological: Negative for adenopathy. Does not bruise/bleed easily.    Psychiatric/Behavioral: Negative.     Objective:  BP 128/88 (BP Location: Left Arm, Patient Position: Sitting, Cuff Size: Normal)   Pulse 73   Temp 98.1 F (36.7 C) (Oral)   Resp 16   Ht 5\' 10"  (1.778 m)   Wt 209 lb (94.8 kg)   SpO2 96%   BMI 29.99 kg/m   BP Readings from Last 3 Encounters:  03/24/16 128/88  03/03/16 126/80  02/03/16 128/80    Wt Readings from Last 3 Encounters:  03/24/16 209 lb (94.8 kg)  03/03/16 208 lb (94.3 kg)  02/03/16 204 lb (92.5 kg)    Physical Exam  Constitutional: He is oriented to person, place, and time. No distress.  HENT:  Mouth/Throat: Oropharynx is clear and moist. No oropharyngeal exudate.  Eyes: Conjunctivae are normal. Right eye exhibits no discharge. Left eye exhibits no discharge. No scleral icterus.  Neck: Normal range of motion. No tracheal deviation present. No thyromegaly present.  Cardiovascular: Normal rate, regular rhythm, normal heart sounds and intact distal pulses.  Exam reveals no gallop and no friction rub.   No murmur heard. Pulmonary/Chest: Effort normal and breath sounds normal. No stridor. No respiratory distress. He has no wheezes. He has no rales. He exhibits no tenderness.  Abdominal: Soft. Bowel sounds are normal. He exhibits no distension and no mass. There is no tenderness. There is no rebound and no guarding.  Musculoskeletal: Normal range of motion. He exhibits no edema, tenderness or deformity.  Lymphadenopathy:    He has no cervical adenopathy.  Neurological: He is  oriented to person, place, and time.  Skin: Skin is warm and dry. No rash noted. He is not diaphoretic. No erythema. No pallor.  Vitals reviewed.   Lab Results  Component Value Date   WBC 10.2 11/04/2014   HGB 16.2 11/04/2014   HCT 48.6 11/04/2014   PLT 179.0 11/04/2014   GLUCOSE 101 (H) 10/01/2015   CHOL 179 10/01/2015   TRIG 100.0 10/01/2015   HDL 43.40 10/01/2015   LDLCALC 115 (H) 10/01/2015   ALT 19 10/01/2015   AST 21 10/01/2015    NA 140 10/01/2015   K 4.1 10/01/2015   CL 105 10/01/2015   CREATININE 1.17 10/01/2015   BUN 17 10/01/2015   CO2 28 10/01/2015   TSH 2.21 03/24/2016   PSA 3.88 10/01/2015    Dg Epidural/nerve Root  Result Date: 01/13/2016 CLINICAL DATA:  Lumbosacral spondylosis without myelopathy with radiculopathy. Low back pain and right lower extremity pain. Excellent response to the left L4 nerve root block last year. Patient reports pain appearing in the last month, located in the low back and radiating down the right leg. Repeat injection requested at L4. A right-sided transforaminal injection will be performed today given the lack of significant left-sided pain. EXAM: EPIDURAL/NERVE ROOT FLUOROSCOPY TIME:  Radiation Exposure Index (as provided by the fluoroscopic device): 30.22 microGray*m^2 Fluoroscopy Time (in minutes and seconds):  23 seconds PROCEDURE: The procedure, risks, benefits, and alternatives were explained to the patient. Questions regarding the procedure were encouraged and answered. The patient understands and consents to the procedure. RIGHT L4 NERVE ROOT BLOCK AND TRANSFORAMINAL EPIDURAL: A posterior oblique approach was taken to the intervertebral foramen on the right at L4-5 using a curved 5 inch 22 gauge spinal needle. Injection of Isovue-M 200 outlined the right L4 nerve root and showed good epidural spread. No vascular opacification is seen. 120 mg of Depo-Medrol mixed with 1.5 mL of 1% lidocaine were instilled. The procedure was well-tolerated, and the patient was discharged thirty minutes following the injection in good condition. COMPLICATIONS: None IMPRESSION: Technically successful injection consisting of a right L4 nerve root block and transforaminal epidural. Electronically Signed   By: Logan Bores M.D.   On: 01/13/2016 15:02    Assessment & Plan:   Keith [name] was seen today for hypothyroidism.  Diagnoses and all orders for this visit:  Acquired hypothyroidism- his TSH is in  the normal range, he will remain on the current dose of levothyroxine. -     TSH; Future -     levothyroxine (SYNTHROID, LEVOTHROID) 125 MCG tablet; Take 1 tablet (125 mcg total) by mouth daily.  Hyperlipidemia with target LDL less than 130- despite having an elevated Framingham risk of 12% he is not willing to take a statin for cardiovascular risk reduction.  Other specified hypothyroidism   I have discontinued Mr. Golinski's (multivitamin,tx-minerals) and gabapentin. I am also having him maintain his aspirin, fish oil-omega-3 fatty acids, Red Yeast Rice, fluticasone, traMADol, and levothyroxine.  Meds ordered this encounter  Medications  . levothyroxine (SYNTHROID, LEVOTHROID) 125 MCG tablet    Sig: Take 1 tablet (125 mcg total) by mouth daily.    Dispense:  90 tablet    Refill:  1     Follow-up: Return in about 4 months (around 07/22/2016).  Scarlette Calico, MD

## 2016-03-24 NOTE — Patient Instructions (Signed)
Hypothyroidism Hypothyroidism is a disorder of the thyroid. The thyroid is a large gland that is located in the lower front of the neck. The thyroid releases hormones that control how the body works. With hypothyroidism, the thyroid does not make enough of these hormones. What are the causes? Causes of hypothyroidism may include:  Viral infections.  Pregnancy.  Your own defense system (immune system) attacking your thyroid.  Certain medicines.  Birth defects.  Past radiation treatments to your head or neck.  Past treatment with radioactive iodine.  Past surgical removal of part or all of your thyroid.  Problems with the gland that is located in the center of your brain (pituitary).  What are the signs or symptoms? Signs and symptoms of hypothyroidism may include:  Feeling as though you have no energy (lethargy).  Inability to tolerate cold.  Weight gain that is not explained by a change in diet or exercise habits.  Dry skin.  Coarse hair.  Menstrual irregularity.  Slowing of thought processes.  Constipation.  Sadness or depression.  How is this diagnosed? Your health care provider may diagnose hypothyroidism with blood tests and ultrasound tests. How is this treated? Hypothyroidism is treated with medicine that replaces the hormones that your body does not make. After you begin treatment, it may take several weeks for symptoms to go away. Follow these instructions at home:  Take medicines only as directed by your health care provider.  If you start taking any new medicines, tell your health care provider.  Keep all follow-up visits as directed by your health care provider. This is important. As your condition improves, your dosage needs may change. You will need to have blood tests regularly so that your health care provider can watch your condition. Contact a health care provider if:  Your symptoms do not get better with treatment.  You are taking thyroid  replacement medicine and: ? You sweat excessively. ? You have tremors. ? You feel anxious. ? You lose weight rapidly. ? You cannot tolerate heat. ? You have emotional swings. ? You have diarrhea. ? You feel weak. Get help right away if:  You develop chest pain.  You develop an irregular heartbeat.  You develop a rapid heartbeat. This information is not intended to replace advice given to you by your health care provider. Make sure you discuss any questions you have with your health care provider. Document Released: 01/24/2005 Document Revised: 07/02/2015 Document Reviewed: 06/11/2013 Elsevier Interactive Patient Education  2017 [employer]  

## 2016-03-26 IMAGING — DX DG HIP (WITH OR WITHOUT PELVIS) 2-3V*R*
3 series · 3 of 3 positions shown · non-contrast
Comparison: None.

CLINICAL DATA: Right hip and knee pain 1 locking over the last 3
weeks.

EXAM:
DG HIP (WITH OR WITHOUT PELVIS) 2-3V RIGHT

[pelvis ap]
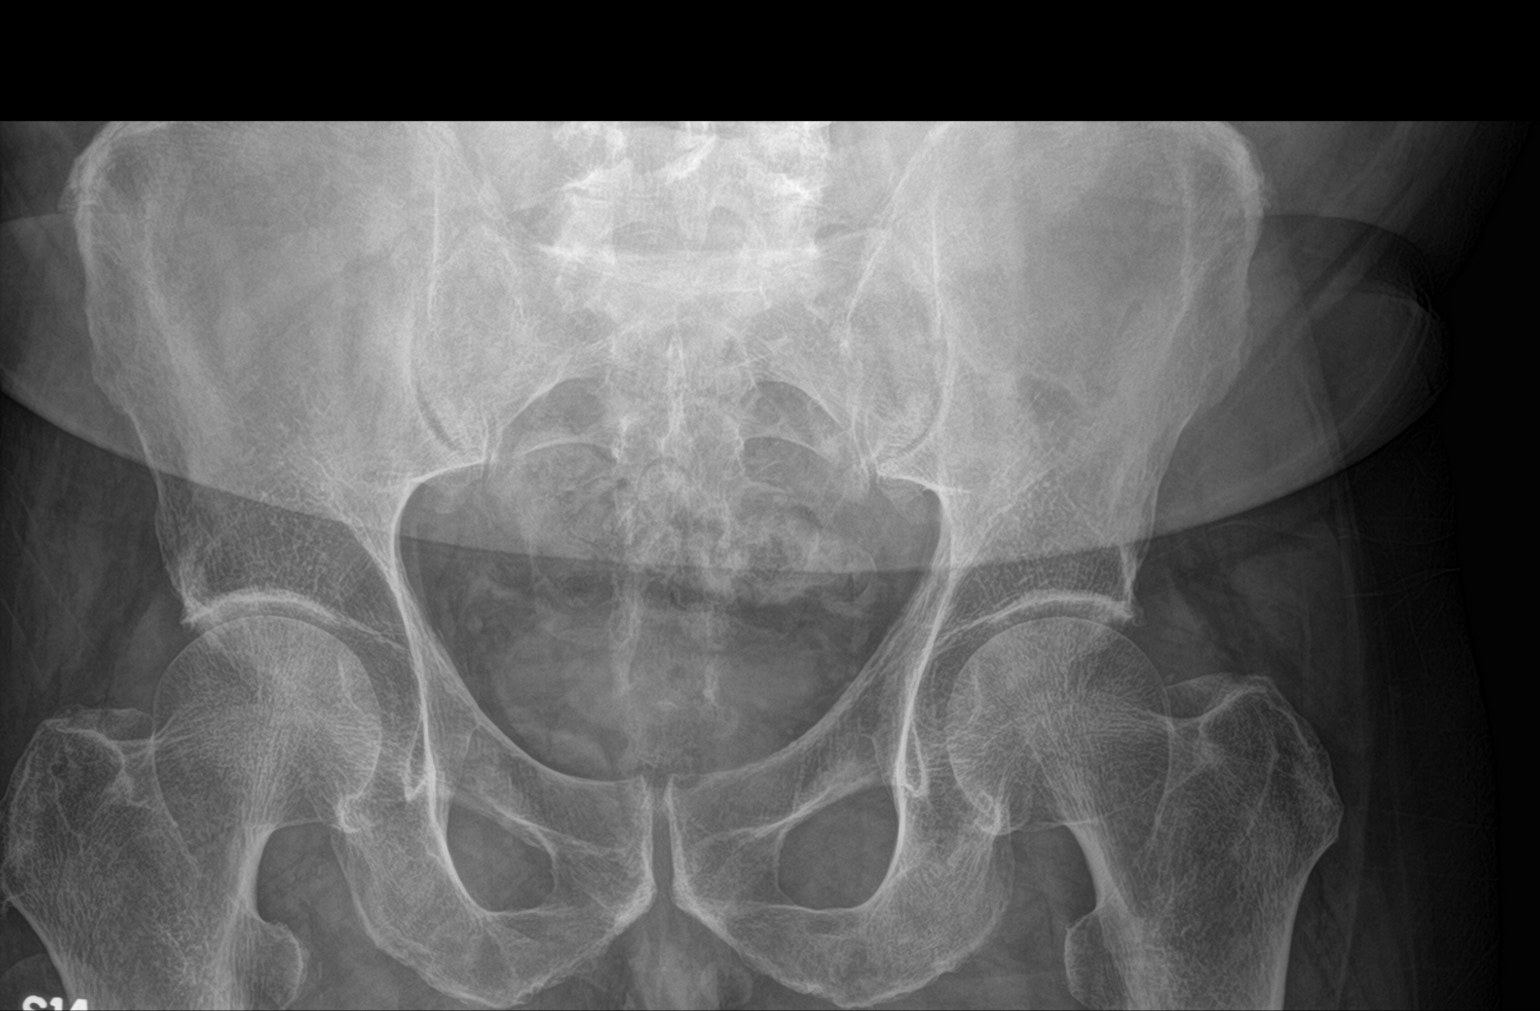

[hip ap]
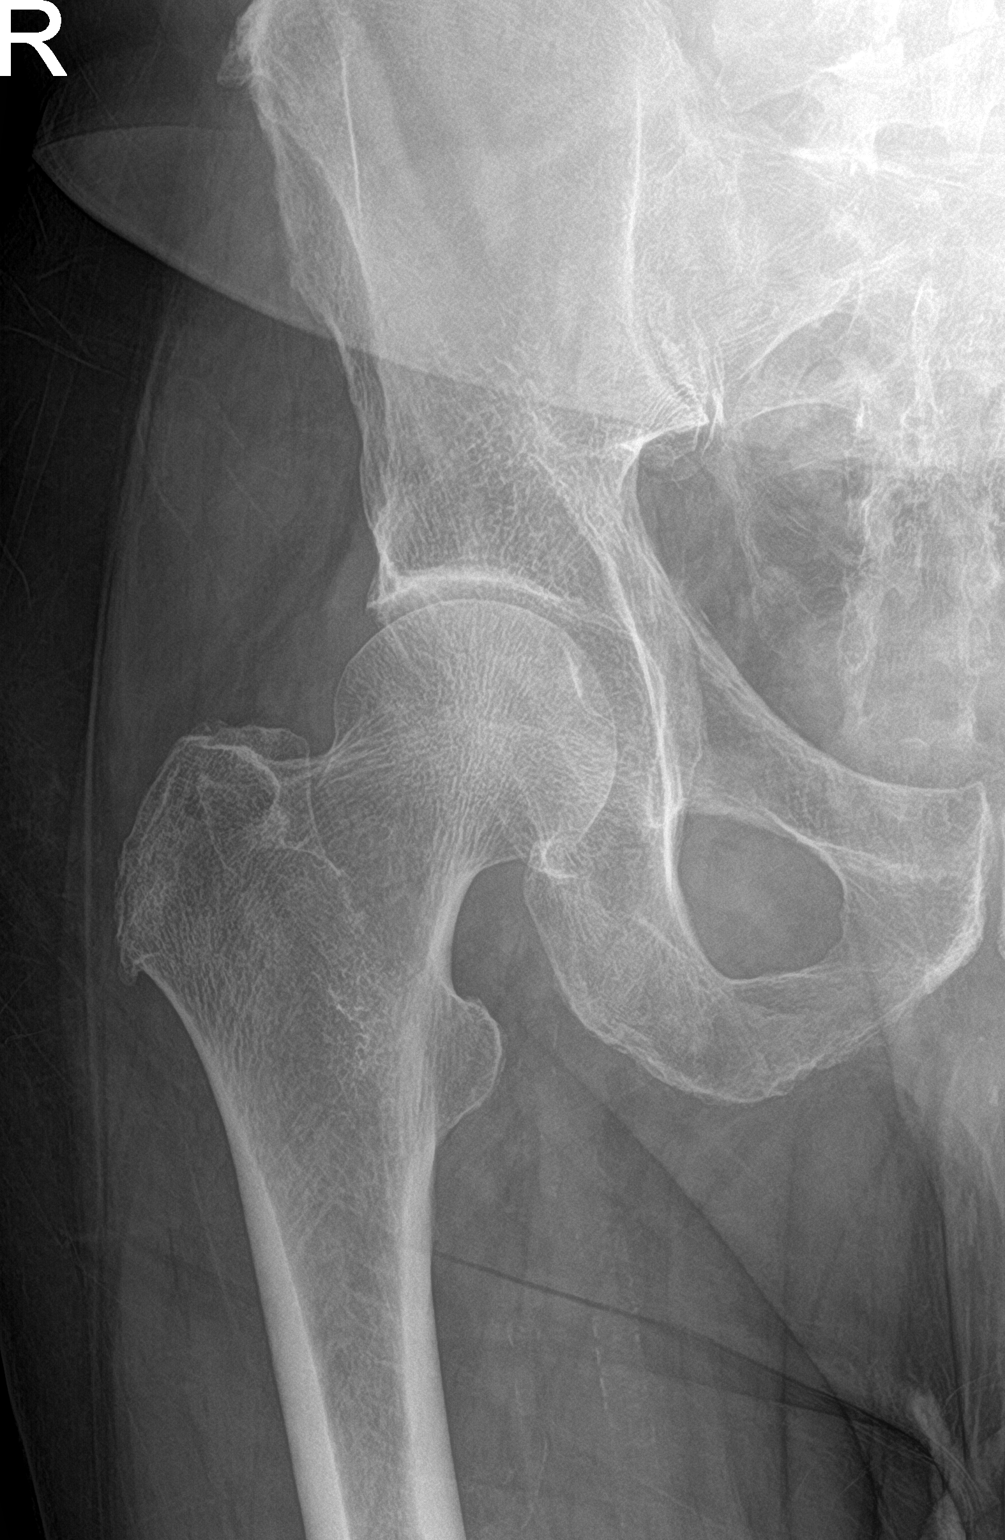

[hip lat]
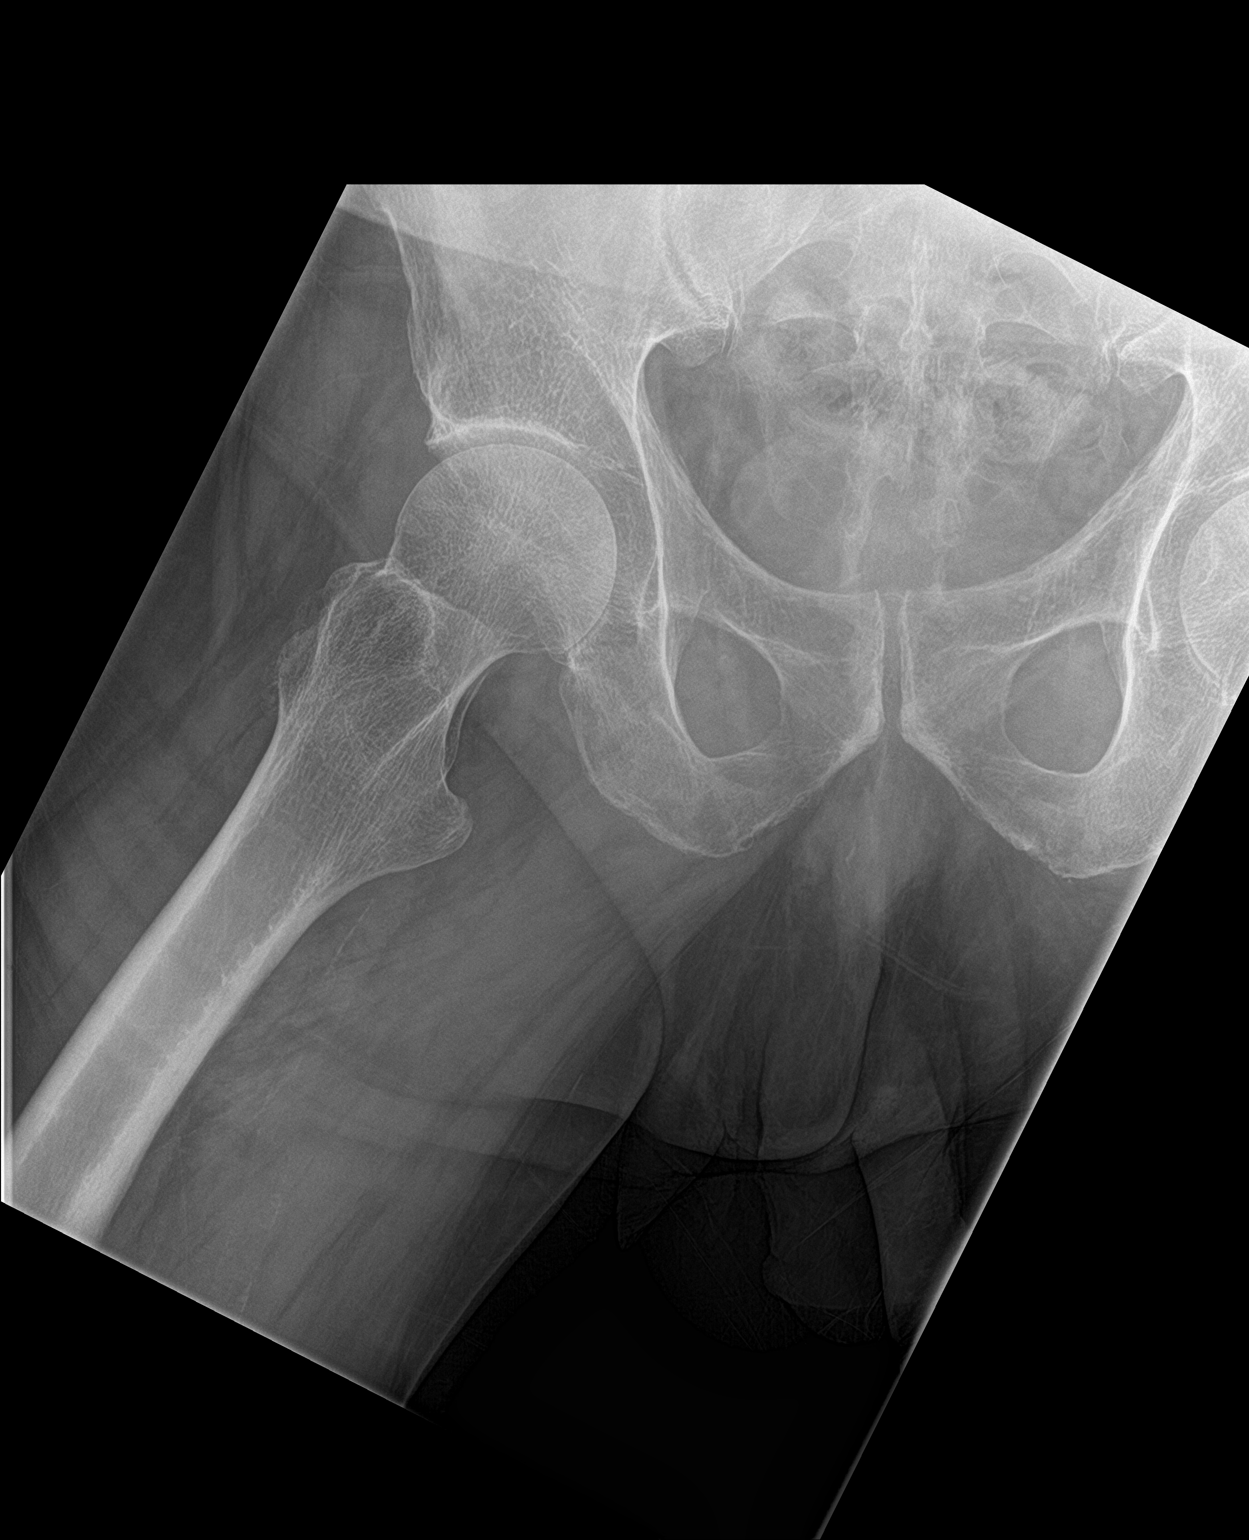

[3 of 3 positions shown; findings below may reference images not displayed]

FINDINGS: Lower lumbar degenerative disc disease.

Articular space in the right hip is preserved. No fracture is
identified. No acute bony findings identified.
IMPRESSION: 1. Lower lumbar degenerative disc disease.
2. Preserved articular space in the right hip without acute bony
abnormality to explain the patient's new symptoms.

## 2016-03-27 ENCOUNTER — Other Ambulatory Visit: Payer: Self-pay | Admitting: Internal Medicine

## 2016-03-27 DIAGNOSIS — E038 Other specified hypothyroidism: Secondary | ICD-10-CM

## 2016-05-12 ENCOUNTER — Ambulatory Visit: Payer: Self-pay

## 2016-05-12 ENCOUNTER — Encounter: Payer: Self-pay | Admitting: Internal Medicine

## 2016-05-12 ENCOUNTER — Ambulatory Visit: Payer: Medicare Other | Admitting: Family Medicine

## 2016-05-12 ENCOUNTER — Ambulatory Visit (INDEPENDENT_AMBULATORY_CARE_PROVIDER_SITE_OTHER): Payer: Medicare Other | Admitting: Family Medicine

## 2016-05-12 VITALS — BP 128/80 | HR 70 | Temp 97.8°F | Ht 70.0 in | Wt 206.5 lb

## 2016-05-12 DIAGNOSIS — M659 Synovitis and tenosynovitis, unspecified: Secondary | ICD-10-CM

## 2016-05-12 DIAGNOSIS — M79642 Pain in left hand: Secondary | ICD-10-CM

## 2016-05-12 NOTE — Progress Notes (Signed)
CC: left hand pain   HPI: 76 yo male coming in for pain in the left 3rd MCP pain.  Seen a long time ago with the same presentation, did well with injection.  Now worsening swelling and redness of the 3rd finger. Patient does not remember any injury.Denies any numbness.He is getting in the way of daily activities.  Past Medical History:  Diagnosis Date  . Allergic rhinitis   . DDD (degenerative disc disease), cervical   . Hyperlipidemia   . Hypothyroidism    Past Surgical History:  Procedure Laterality Date  . BACK SURGERY  2016  . TONSILLECTOMY     Social History  Substance Use Topics  . Smoking status: Never Smoker  . Smokeless tobacco: Never Used  . Alcohol use No   Allergies  Allergen Reactions  . Atorvastatin Other (See Comments)    leg cramps  . Rosuvastatin Other (See Comments)    myalgias   Family History  Problem Relation Age of Onset  . Coronary artery disease Father     Fatal MI  . Coronary artery disease Other   . Breast cancer Other   . Diabetes Father   . Diabetes Mother      Past medical, surgical, family and social history reviewed. Medications reviewed all in the electronic medical record.  Review of Systems: No headache, visual changes, nausea, vomiting, diarrhea, constipation, dizziness, abdominal pain, skin rash, fevers, chills, night sweats, weight loss, swollen lymph nodes, body aches,muscle aches, chest pain, shortness of breath, mood changes.  positive joint swelling  Objective:    Blood pressure 128/80, pulse 70, temperature 97.8 F (36.6 C), temperature source Oral, height 5\' 10"  (1.778 m), weight 206 lb 8 oz (93.7 kg), SpO2 96 %.   General: No apparent distress alert and oriented x3 mood and affect normal, dressed appropriately.  HEENT: Pupils equal, extraocular movements intact Respiratory: Patient's speak in full sentences and does not appear short of breath Cardiovascular: No lower extremity edema, non tender, no erythema Skin: Warm  dry intact with no signs of infection or rash on extremities or on axial skeleton. Abdomen: Soft nontender Neuro: Cranial nerves II through XII are intact, neurovascularly intact in all extremities with 2+ DTRs and 2+ pulses. Lymph: No lymphadenopathy of posterior or anterior cervical chain or axillae bilaterally.  Gait normal with good balance and coordination.  MSK: Non tender with full range of motion and good stability and symmetric strength and tone of the hip, shoulders, elbows,  knee and ankles bilaterally.  Wrist:eft Inspection normal with no visible erythema or swelling.Asian does have amputations of multiple fingers on this hand as well as the contralateral hand ROM smooth and normal with good flexion and extension and ulnar/radial deviation that is symmetrical with opposite wrist. Palpation is normal over metacarpals, navicular, lunate, and TFCC; tendons without tenderness/ swelling No snuffbox tenderness. No tenderness over Canal of [place]. Strength 5/5 in all directions without pain. Negative Finkelstein, tinel's and phalens. Negative Watson's test. Eft see MCP joint of the third finger significant swelling and tenderness. Decreased range of motionlacking the 176 of flexion     Procedure: Real-time Ultrasound Guided Injection of left third MCP Device: GE Logiq Q7 Ultrasound guided injection is preferred based studies that show increased duration, increased effect, greater accuracy, decreased procedural pain, increased response rate, and decreased cost with ultrasound guided versus blind injection.  Verbal informed consent obtained.  Time-out conducted.  Noted no overlying erythema, induration, or other signs of local infection.  Skin prepped  in a sterile fashion.  Local anesthesia: Topical Ethyl chloride.  With sterile technique and under real time ultrasound guidance:  He 25-gauge half-inch needle was injected with a total of 0.5 mL of 0.5% Marcaine and 0.5 mL of Kenalog 40  mg/dL. Completed without difficulty  Pain immediately resolved suggesting accurate placement of the medication.  Advised to call if fevers/chills, erythema, induration, drainage, or persistent bleeding.  Images permanently stored and available for review in the ultrasound unit.  Impression: Technically successful ultrasound guided injection.  Impression and Recommendations:     This case required medical decision making of moderate complexity.

## 2016-05-12 NOTE — Assessment & Plan Note (Addendum)
Worsening symptoms. Patient given injection today and tolerated the procedure well. We discussed icing regimen and home exercises. We discussed which activities to do in which ones to avoid. Patient will increase activity as tolerated. Follow-up again as needed

## 2016-05-12 NOTE — Patient Instructions (Signed)
Good to see you  Sorry for the confusion.  Ice is your friend.  See me when you need me or in 4 weeks for your shoulder

## 2016-05-12 NOTE — Progress Notes (Signed)
Pre visit review using our clinic review tool, if applicable. No additional management support is needed unless otherwise documented below in the visit note. 

## 2016-07-05 ENCOUNTER — Ambulatory Visit: Payer: Medicare Other | Admitting: Family Medicine

## 2016-08-10 NOTE — Progress Notes (Signed)
[name] [place] [place], [place] [address] Phone: [phone] Subjective:     CC: Left third finger pain  [id number]  Keith [name] is a 76 y.o. male coming in with complaint of left third finger pain. Has been seen previously and diagnosed with more of a synovitis. Patient states it is worsening in. Patient states that starting to affect daily activities and waking him up at night. Decreasing range of motion as well. Has tried some of the over-the-counter medications and no significant improvement.     Past Medical History:  Diagnosis Date  . Allergic rhinitis   . DDD (degenerative disc disease), cervical   . Hyperlipidemia   . Hypothyroidism    Past Surgical History:  Procedure Laterality Date  . BACK SURGERY  2016  . TONSILLECTOMY     Social History   Social History  . Marital status: Married    Spouse name: N/A  . Number of children: N/A  . Years of education: N/A   Occupational History  . 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 [place] [employer]    Social History Main Topics  . Smoking status: Never Smoker  . Smokeless tobacco: Never Used  . Alcohol use No  . Drug use: No  . Sexual activity: Not Currently   Other Topics Concern  . None   Social History Narrative   Regular Exercise -  YES         Allergies  Allergen Reactions  . Atorvastatin Other (See Comments)    leg cramps  . Rosuvastatin Other (See Comments)    myalgias   Family History  Problem Relation Age of Onset  . Coronary artery disease Father        Fatal MI  . Coronary artery disease Other   . Breast cancer Other   . Diabetes Father   . Diabetes Mother     Past medical history, social, surgical and family history all reviewed in electronic medical record.  No pertanent information unless stated regarding to the chief complaint.   Review of Systems:Review of systems updated and as accurate as of 08/11/16  No headache, visual changes,  nausea, vomiting, diarrhea, constipation, dizziness, abdominal pain, skin rash, fevers, chills, night sweats, weight loss, swollen lymph nodes, body aches, joint swelling,  chest pain, shortness of breath, mood changes. Positive muscle aches and some mild instability with balance  Objective  Blood pressure 134/82, pulse 71, height 5\' 10"  (1.778 m), weight 200 lb (90.7 kg), SpO2 97 %. Systems examined below as of 08/11/16   General: No apparent distress alert and oriented x3 mood and affect normal, dressed appropriately.  HEENT: Pupils equal, extraocular movements intact  Respiratory: Patient's speak in full sentences and does not appear short of breath  Cardiovascular: No lower extremity edema, non tender, no erythema  Skin: Warm dry intact with no signs of infection or rash on extremities or on axial skeleton.  Abdomen: Soft nontender  Neuro: Cranial nerves II through XII are intact, neurovascularly intact in all extremities with 2+ DTRs and 2+ pulses.  Lymph: No lymphadenopathy of posterior or anterior cervical chain or axillae bilaterally.  Gait normal with good balance and coordination.  MSK:  Non tender with full range of motion and good stability and symmetric strength and tone of shoulders, elbows, wrist, hip, knee and ankles bilaterally. Has had some amputations of 2 through 4 fingers on the right hand. Left third MCP does have significant swelling compared to  the other ones. Mild loss of the last 5 of flexion with full extension.  Procedure: Real-time Ultrasound Guided Injection of left third MCP Device: GE Logiq Q7 Ultrasound guided injection is preferred based studies that show increased duration, increased effect, greater accuracy, decreased procedural pain, increased response rate, and decreased cost with ultrasound guided versus blind injection.  Verbal informed consent obtained.  Time-out conducted.  Noted no overlying erythema, induration, or other signs of local infection.    Skin prepped in a sterile fashion.  Local anesthesia: Topical Ethyl chloride.  With sterile technique and under real time ultrasound guidance:  The 25-gauge half-inch no patient was injected with a total of 0.5 mL of 0.5% Marcaine and 0.5 mL of Kenalog 40 mg/dL. Completed without difficulty  Pain immediately resolved suggesting accurate placement of the medication.  Advised to call if fevers/chills, erythema, induration, drainage, or persistent bleeding.  Images permanently stored and available for review in the ultrasound unit.  Impression: Technically successful ultrasound guided injection.    Impression and Recommendations:     This case required medical decision making of moderate complexity.      Note: This dictation was prepared with Dragon dictation along with smaller phrase technology. Any transcriptional errors that result from this process are unintentional.

## 2016-08-11 ENCOUNTER — Ambulatory Visit: Payer: Self-pay

## 2016-08-11 ENCOUNTER — Ambulatory Visit (INDEPENDENT_AMBULATORY_CARE_PROVIDER_SITE_OTHER): Payer: Medicare Other | Admitting: Family Medicine

## 2016-08-11 ENCOUNTER — Encounter: Payer: Self-pay | Admitting: Family Medicine

## 2016-08-11 VITALS — BP 134/82 | HR 71 | Ht 70.0 in | Wt 200.0 lb

## 2016-08-11 DIAGNOSIS — M79642 Pain in left hand: Secondary | ICD-10-CM

## 2016-08-11 DIAGNOSIS — M659 Synovitis and tenosynovitis, unspecified: Secondary | ICD-10-CM

## 2016-08-11 MED ORDER — GABAPENTIN 100 MG PO CAPS: 200.0000 mg | ORAL_CAPSULE | Freq: Every day | ORAL | 3 refills | Status: DC

## 2016-08-11 NOTE — Patient Instructions (Signed)
Good to see you  I hope this helps for at least another 3 months.  Stay active Try to maybe splint it at night with a tongue depressor.  See me again when you need me.

## 2016-08-11 NOTE — Assessment & Plan Note (Signed)
Patient given repeat injection today. Tolerated the procedure well. Discussed icing regimen and home exercises. Discussed bracing at night. Worsening symptoms of a previous diagnosis. Follow-up again in 4-6 weeks

## 2016-09-01 ENCOUNTER — Other Ambulatory Visit: Payer: Self-pay | Admitting: Internal Medicine

## 2016-09-01 DIAGNOSIS — E038 Other specified hypothyroidism: Secondary | ICD-10-CM

## 2016-09-20 DIAGNOSIS — H40033 Anatomical narrow angle, bilateral: Secondary | ICD-10-CM | POA: Diagnosis not present

## 2016-09-20 DIAGNOSIS — H2513 Age-related nuclear cataract, bilateral: Secondary | ICD-10-CM | POA: Diagnosis not present

## 2016-11-23 ENCOUNTER — Other Ambulatory Visit: Payer: Self-pay | Admitting: Internal Medicine

## 2016-11-29 NOTE — Progress Notes (Signed)
[name] [address] [place], [place] [address] Phone: [phone] Subjective:    I'm seeing this patient by the request  of:    CC: Hand pain.  OXB:[id number]  Keith [name] is a 76 y.o. male coming in with complaint of hand pain. Patient has been seen before and has had a third MCP synovitis from time to time. Patient states he has been taking glucosamine. He is here for an injection today on his left hand. Patient's is having worsening symptoms again. Describes the pain as a dull, throbbing aching sensation. Did do some mowing the other day.     Past Medical History:  Diagnosis Date  . Allergic rhinitis   . DDD (degenerative disc disease), cervical   . Hyperlipidemia   . Hypothyroidism    Past Surgical History:  Procedure Laterality Date  . BACK SURGERY  2016  . TONSILLECTOMY     Social History   Social History  . Marital status: Married    Spouse name: N/A  . Number of children: N/A  . Years of education: N/A   Occupational History  . 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 Smoking status: Never Smoker  . Smokeless tobacco: Never Used  . Alcohol use No  . Drug use: No  . Sexual activity: Not Currently   Other Topics Concern  . None   Social History Narrative   Regular Exercise -  YES         Allergies  Allergen Reactions  . Atorvastatin Other (See Comments)    leg cramps  . Rosuvastatin Other (See Comments)    myalgias   Family History  Problem Relation Age of Onset  . Coronary artery disease Father        Fatal MI  . Coronary artery disease Other   . Breast cancer Other   . Diabetes Father   . Diabetes Mother      Past medical history, social, surgical and family history all reviewed in electronic medical record.  No pertanent information unless stated regarding to the chief complaint.   Review of Systems:Review of systems updated and as accurate as of  11/30/16  No headache, visual changes, nausea, vomiting, diarrhea, constipation, dizziness, abdominal pain, skin rash, fevers, chills, night sweats, weight loss, swollen lymph nodes, body aches, joint swelling,  chest pain, shortness of breath, mood changes. Positive muscle aches  Objective  Blood pressure 98/62, pulse (!) 57, height 5' 9.5" (1.765 m), weight 203 lb (92.1 kg), SpO2 92 %. Systems examined below as of 11/30/16   General: No apparent distress alert and oriented x3 mood and affect normal, dressed appropriately.  HEENT: Pupils equal, extraocular movements intact  Respiratory: Patient's speak in full sentences and does not appear short of breath  Cardiovascular: No lower extremity edema, non tender, no erythema  Skin: Warm dry intact with no signs of infection or rash on extremities or on axial skeleton.  Abdomen: Soft nontender  Neuro: Cranial nerves II through XII are intact, neurovascularly intact in all extremities with 2+ DTRs and 2+ pulses.  Lymph: No lymphadenopathy of posterior or anterior cervical chain or axillae bilaterally.  Gait normal with good balance and coordination.  MSK:  Non tender with full range of motion and good stability and symmetric strength and tone of shoulders, elbows, wrist, hip, knee and ankles bilaterally. Arthritic changes of multiple joints. Patient has had amputations of multiple  fingers on the right and Left hand shows the patient does have swelling of the third MCP.   Procedure: Real-time Ultrasound Guided Injection of left third MCP Device: GE Logiq Q7 Ultrasound guided injection is preferred based studies that show increased duration, increased effect, greater accuracy, decreased procedural pain, increased response rate, and decreased cost with ultrasound guided versus blind injection.  Verbal informed consent obtained.  Time-out conducted.  Noted no overlying erythema, induration, or other signs of local infection.  Skin prepped in a  sterile fashion.  Local anesthesia: Topical Ethyl chloride.  With sterile technique and under real time ultrasound guidance:  With a 25-gauge half-inch needle was injected with a total of 0.5 mL of 0.5% Marcaine and 0.5 mL of Kenalog 40 mg/dL. Completed without difficulty  Pain immediately resolved suggesting accurate placement of the medication.  Advised to call if fevers/chills, erythema, induration, drainage, or persistent bleeding.  Images permanently stored and available for review in the ultrasound unit.  Impression: Technically successful ultrasound guided injection.   Impression and Recommendations:     This case required medical decision making of moderate complexity.      Note: This dictation was prepared with Dragon dictation along with smaller phrase technology. Any transcriptional errors that result from this process are unintentional.

## 2016-11-30 ENCOUNTER — Ambulatory Visit: Payer: Self-pay

## 2016-11-30 ENCOUNTER — Ambulatory Visit (INDEPENDENT_AMBULATORY_CARE_PROVIDER_SITE_OTHER): Payer: Medicare Other | Admitting: Family Medicine

## 2016-11-30 ENCOUNTER — Encounter: Payer: Self-pay | Admitting: Family Medicine

## 2016-11-30 VITALS — BP 98/62 | HR 57 | Ht 69.5 in | Wt 203.0 lb

## 2016-11-30 DIAGNOSIS — M79642 Pain in left hand: Secondary | ICD-10-CM

## 2016-11-30 DIAGNOSIS — M659 Synovitis and tenosynovitis, unspecified: Secondary | ICD-10-CM

## 2016-11-30 NOTE — Patient Instructions (Signed)
Good to see you   

## 2016-11-30 NOTE — Assessment & Plan Note (Signed)
Patient given injection today. Tolerated the procedure well. We discussed icing regimen and home exercises. We discussed which activities doing which ones to avoid. Patient with continue to increase activity as tolerated. Follow-up with me again in 10 weeks

## 2016-12-06 ENCOUNTER — Other Ambulatory Visit (INDEPENDENT_AMBULATORY_CARE_PROVIDER_SITE_OTHER): Payer: Medicare Other

## 2016-12-06 ENCOUNTER — Encounter: Payer: Self-pay | Admitting: Internal Medicine

## 2016-12-06 ENCOUNTER — Ambulatory Visit (INDEPENDENT_AMBULATORY_CARE_PROVIDER_SITE_OTHER): Payer: Medicare Other | Admitting: Internal Medicine

## 2016-12-06 VITALS — BP 128/80 | HR 58 | Temp 98.2°F | Resp 16 | Ht 69.5 in | Wt 201.0 lb

## 2016-12-06 DIAGNOSIS — D692 Other nonthrombocytopenic purpura: Secondary | ICD-10-CM

## 2016-12-06 DIAGNOSIS — E032 Hypothyroidism due to medicaments and other exogenous substances: Secondary | ICD-10-CM

## 2016-12-06 DIAGNOSIS — Z0001 Encounter for general adult medical examination with abnormal findings: Secondary | ICD-10-CM | POA: Diagnosis not present

## 2016-12-06 DIAGNOSIS — N402 Nodular prostate without lower urinary tract symptoms: Secondary | ICD-10-CM

## 2016-12-06 DIAGNOSIS — E785 Hyperlipidemia, unspecified: Secondary | ICD-10-CM | POA: Diagnosis not present

## 2016-12-06 DIAGNOSIS — Z0000 Encounter for general adult medical examination without abnormal findings: Secondary | ICD-10-CM

## 2016-12-06 DIAGNOSIS — E038 Other specified hypothyroidism: Secondary | ICD-10-CM

## 2016-12-06 LAB — COMPREHENSIVE METABOLIC PANEL
ALT: 15 U/L (ref 0–53)
AST: 16 U/L (ref 0–37)
Albumin: 3.8 g/dL (ref 3.5–5.2)
Alkaline Phosphatase: 45 U/L (ref 39–117)
BILIRUBIN TOTAL: 0.7 mg/dL (ref 0.2–1.2)
BUN: 15 mg/dL (ref 6–23)
CALCIUM: 8.9 mg/dL (ref 8.4–10.5)
CO2: 29 mEq/L (ref 19–32)
CREATININE: 0.99 mg/dL (ref 0.40–1.50)
Chloride: 103 mEq/L (ref 96–112)
GFR: 78.28 mL/min (ref >60.00)
Glucose, Bld: 85 mg/dL (ref 70–99)
POTASSIUM: 4.4 meq/L (ref 3.5–5.1)
Sodium: 138 mEq/L (ref 135–145)
Total Protein: 6.4 g/dL (ref 6.0–8.3)

## 2016-12-06 LAB — CBC WITH DIFFERENTIAL/PLATELET
BASOS ABS: 0.1 10*3/uL (ref 0.0–0.1)
BASOS PCT: 0.8 % (ref 0.0–3.0)
EOS ABS: 0.1 10*3/uL (ref 0.0–0.7)
Eosinophils Relative: 1 % (ref 0.0–5.0)
HCT: 48.5 % (ref 39.0–52.0)
HEMOGLOBIN: 16 g/dL (ref 13.0–17.0)
Lymphocytes Relative: 19.9 % (ref 12.0–46.0)
Lymphs Abs: 2.3 10*3/uL (ref 0.7–4.0)
MCHC: 32.9 g/dL (ref 30.0–36.0)
MCV: 92.5 fl (ref 78.0–100.0)
MONO ABS: 0.8 10*3/uL (ref 0.1–1.0)
Monocytes Relative: 6.8 % (ref 3.0–12.0)
NEUTROS ABS: 8.1 10*3/uL — AB (ref 1.4–7.7)
Neutrophils Relative %: 71.5 % (ref 43.0–77.0)
Platelets: 202 10*3/uL (ref 150.0–400.0)
RBC: 5.25 Mil/uL (ref 4.22–5.81)
RDW: 13.9 % (ref 11.5–15.5)
WBC: 11.3 10*3/uL — AB (ref 4.0–10.5)

## 2016-12-06 LAB — LIPID PANEL
CHOL/HDL RATIO: 5
CHOLESTEROL: 183 mg/dL (ref 0–200)
HDL: 35.9 mg/dL — AB (ref >39.00)
LDL CALC: 122 mg/dL — AB (ref 0–99)
NonHDL: 147.59
TRIGLYCERIDES: 128 mg/dL (ref 0.0–149.0)
VLDL: 25.6 mg/dL (ref 0.0–40.0)

## 2016-12-06 LAB — URINALYSIS, ROUTINE W REFLEX MICROSCOPIC
BILIRUBIN URINE: NEGATIVE
Ketones, ur: NEGATIVE
LEUKOCYTES UA: NEGATIVE
Nitrite: NEGATIVE
Specific Gravity, Urine: <=1.005 — AB (ref 1.000–1.030)
TOTAL PROTEIN, URINE-UPE24: NEGATIVE
UROBILINOGEN UA: 0.2 (ref 0.0–1.0)
Urine Glucose: NEGATIVE
WBC UA: NONE SEEN (ref 0–?)
pH: 6 (ref 5.0–8.0)

## 2016-12-06 LAB — PSA: PSA: 3.18 ng/mL (ref 0.10–4.00)

## 2016-12-06 LAB — TSH: TSH: 7.92 u[IU]/mL — AB (ref 0.35–4.50)

## 2016-12-06 MED ORDER — LEVOTHYROXINE SODIUM 125 MCG PO TABS: ORAL_TABLET | ORAL | 1 refills | Status: DC

## 2016-12-06 NOTE — Patient Instructions (Signed)
Hypothyroidism Hypothyroidism is a disorder of the thyroid. The thyroid is a large gland that is located in the lower front of the neck. The thyroid releases hormones that control how the body works. With hypothyroidism, the thyroid does not make enough of these hormones. What are the causes? Causes of hypothyroidism may include:  Viral infections.  Pregnancy.  Your own defense system (immune system) attacking your thyroid.  Certain medicines.  Birth defects.  Past radiation treatments to your head or neck.  Past treatment with radioactive iodine.  Past surgical removal of part or all of your thyroid.  Problems with the gland that is located in the center of your brain (pituitary).  What are the signs or symptoms? Signs and symptoms of hypothyroidism may include:  Feeling as though you have no energy (lethargy).  Inability to tolerate cold.  Weight gain that is not explained by a change in diet or exercise habits.  Dry skin.  Coarse hair.  Menstrual irregularity.  Slowing of thought processes.  Constipation.  Sadness or depression.  How is this diagnosed? Your health care provider may diagnose hypothyroidism with blood tests and ultrasound tests. How is this treated? Hypothyroidism is treated with medicine that replaces the hormones that your body does not make. After you begin treatment, it may take several weeks for symptoms to go away. Follow these instructions at home:  Take medicines only as directed by your health care provider.  If you start taking any new medicines, tell your health care provider.  Keep all follow-up visits as directed by your health care provider. This is important. As your condition improves, your dosage needs may change. You will need to have blood tests regularly so that your health care provider can watch your condition. Contact a health care provider if:  Your symptoms do not get better with treatment.  You are taking thyroid  replacement medicine and: ? You sweat excessively. ? You have tremors. ? You feel anxious. ? You lose weight rapidly. ? You cannot tolerate heat. ? You have emotional swings. ? You have diarrhea. ? You feel weak. Get help right away if:  You develop chest pain.  You develop an irregular heartbeat.  You develop a rapid heartbeat. This information is not intended to replace advice given to you by your health care provider. Make sure you discuss any questions you have with your health care provider. Document Released: 01/24/2005 Document Revised: 07/02/2015 Document Reviewed: 06/11/2013 Elsevier Interactive Patient Education  2017 [employer]  

## 2016-12-06 NOTE — Progress Notes (Signed)
Subjective:  Patient ID: Keith [name], male    DOB: 1940/11/12  Age: 76 y.o. MRN: [id number]  CC: Annual Exam; Hyperlipidemia; and Hypothyroidism   HPI Keith [name] presents for CPX and f/up, he feels well and offers no complaints.  Outpatient Medications Prior to Visit  Medication Sig Dispense Refill  . aspirin 81 MG EC tablet Take 81 mg by mouth daily.      . fish oil-omega-3 fatty acids 1000 MG capsule Take 1 g by mouth daily.      . fluticasone (FLONASE) 50 MCG/ACT nasal spray instill 2 sprays into each nostril once daily 16 g 11  . gabapentin (NEURONTIN) 100 MG capsule Take 2 capsules (200 mg total) by mouth at bedtime. 60 capsule 3  . glucosamine-chondroitin 500-400 MG tablet Take 1 tablet by mouth 2 (two) times daily.    . magnesium 30 MG tablet Take 60 mg by mouth daily.    . Red Yeast Rice 600 MG TABS      . traMADol (ULTRAM) 50 MG tablet Take 1 tablet (50 mg total) by mouth every 12 (twelve) hours as needed. 60 tablet 0  . levothyroxine (SYNTHROID, LEVOTHROID) 125 MCG tablet Take 1 tablet (125 mcg total) by mouth daily. 90 tablet 1  . levothyroxine (SYNTHROID, LEVOTHROID) 125 MCG tablet take 1 tablet by mouth every morning ON AN EMPTY STOMACH 90 tablet 0   No facility-administered medications prior to visit.     ROS Review of Systems  Constitutional: Negative.  Negative for activity change, appetite change, diaphoresis, fatigue and unexpected weight change.  HENT: Negative.   Eyes: Negative for visual disturbance.  Respiratory: Negative.  Negative for apnea, cough, choking, shortness of breath and wheezing.   Cardiovascular: Negative.  Negative for chest pain, palpitations and leg swelling.  Gastrointestinal: Negative for abdominal pain, constipation, diarrhea, nausea and vomiting.  Endocrine: Negative.  Negative for cold intolerance and heat intolerance.  Genitourinary: Negative.  Negative for difficulty urinating, dysuria, frequency, hematuria, scrotal swelling,  testicular pain and urgency.  Musculoskeletal: Negative.  Negative for arthralgias and myalgias.  Skin: Negative.   Allergic/Immunologic: Negative.   Neurological: Negative.   Hematological: Negative for adenopathy. Does not bruise/bleed easily.  Psychiatric/Behavioral: Negative.     Objective:  BP 128/80 (BP Location: Left Arm, Patient Position: Sitting, Cuff Size: Normal)   Pulse (!) 58   Temp 98.2 F (36.8 C) (Oral)   Resp 16   Ht 5' 9.5" (1.765 m)   Wt 201 lb (91.2 kg)   SpO2 97%   BMI 29.26 kg/m   BP Readings from Last 3 Encounters:  12/06/16 128/80  11/30/16 98/62  08/11/16 134/82    Wt Readings from Last 3 Encounters:  12/06/16 201 lb (91.2 kg)  11/30/16 203 lb (92.1 kg)  08/11/16 200 lb (90.7 kg)    Physical Exam  Constitutional: He is oriented to person, place, and time. No distress.  HENT:  Mouth/Throat: Oropharynx is clear and moist. No oropharyngeal exudate.  Eyes: Conjunctivae are normal. Right eye exhibits no discharge. Left eye exhibits no discharge. No scleral icterus.  Neck: Normal range of motion. Neck supple. No JVD present. No thyromegaly present.  Cardiovascular: Normal rate, regular rhythm and intact distal pulses.  Exam reveals no gallop and no friction rub.   No murmur heard. Pulmonary/Chest: Effort normal and breath sounds normal. No respiratory distress. He has no wheezes. He has no rales. He exhibits no tenderness.  Abdominal: Soft. Bowel sounds are normal. He exhibits no distension  and no mass. There is no tenderness. There is no rebound and no guarding. Hernia confirmed negative in the right inguinal area and confirmed negative in the left inguinal area.  Genitourinary: Rectum normal, testes normal and penis normal. Rectal exam shows no external hemorrhoid, no internal hemorrhoid, no fissure, no mass, no tenderness, anal tone normal and guaiac negative stool. Prostate is enlarged (1+ smooth symm BPH). Prostate is not tender. Right testis shows  no mass, no swelling and no tenderness. Right testis is descended. Left testis shows no mass, no swelling and no tenderness. Left testis is descended. Circumcised. No hypospadias or penile tenderness. No discharge found.  Musculoskeletal: Normal range of motion. He exhibits no edema, tenderness or deformity.  Lymphadenopathy:    He has no cervical adenopathy.       Right: No inguinal adenopathy present.       Left: No inguinal adenopathy present.  Neurological: He is alert and oriented to person, place, and time.  Skin: Skin is warm and dry. No rash noted. He is not diaphoretic. No erythema. No pallor.  Psychiatric: He has a normal mood and affect. His behavior is normal. Judgment and thought content normal.  Vitals reviewed.   Lab Results  Component Value Date   WBC 11.3 (H) 12/06/2016   HGB 16.0 12/06/2016   HCT 48.5 12/06/2016   PLT 202.0 12/06/2016   GLUCOSE 85 12/06/2016   CHOL 183 12/06/2016   TRIG 128.0 12/06/2016   HDL 35.90 (L) 12/06/2016   LDLCALC 122 (H) 12/06/2016   ALT 15 12/06/2016   AST 16 12/06/2016   NA 138 12/06/2016   K 4.4 12/06/2016   CL 103 12/06/2016   CREATININE 0.99 12/06/2016   BUN 15 12/06/2016   CO2 29 12/06/2016   TSH 7.92 (H) 12/06/2016   PSA 3.18 12/06/2016    Dg Epidural/nerve Root  Result Date: 01/13/2016 CLINICAL DATA:  Lumbosacral spondylosis without myelopathy with radiculopathy. Low back pain and right lower extremity pain. Excellent response to the left L4 nerve root block last year. Patient reports pain appearing in the last month, located in the low back and radiating down the right leg. Repeat injection requested at L4. A right-sided transforaminal injection will be performed today given the lack of significant left-sided pain. EXAM: EPIDURAL/NERVE ROOT FLUOROSCOPY TIME:  Radiation Exposure Index (as provided by the fluoroscopic device): 30.22 microGray*m^2 Fluoroscopy Time (in minutes and seconds):  23 seconds PROCEDURE: The procedure,  risks, benefits, and alternatives were explained to the patient. Questions regarding the procedure were encouraged and answered. The patient understands and consents to the procedure. RIGHT L4 NERVE ROOT BLOCK AND TRANSFORAMINAL EPIDURAL: A posterior oblique approach was taken to the intervertebral foramen on the right at L4-5 using a curved 5 inch 22 gauge spinal needle. Injection of Isovue-M 200 outlined the right L4 nerve root and showed good epidural spread. No vascular opacification is seen. 120 mg of Depo-Medrol mixed with 1.5 mL of 1% lidocaine were instilled. The procedure was well-tolerated, and the patient was discharged thirty minutes following the injection in good condition. COMPLICATIONS: None IMPRESSION: Technically successful injection consisting of a right L4 nerve root block and transforaminal epidural. Electronically Signed   By: Logan Bores M.D.   On: 01/13/2016 15:02    Assessment & Plan:   [place] was seen today for annual exam, hyperlipidemia and hypothyroidism.  Diagnoses and all orders for this visit:  Senile purpura ([place])- PLT count is normal, no recent bleeding complications noted -  CBC with Differential/Platelet; Future   Hyperlipidemia with target LDL less than 130- he has a significantly elevated ASCVD risk score but is not willing to take a statin for CV risk reduction. -     Lipid panel; Future -     Comprehensive metabolic panel; Future  Hypothyroidism due to medication- he is asymptomatic and has a very slightly elevated TSH.  Will continue with the same levothyroxine dose. -     TSH; Future  Prostate nodule- his PSA is not rising so I am not concerned about prostate cancer.  He has no symptoms that need to be treated. -     PSA; Future -     Urinalysis, Routine w reflex microscopic; Future  Other specified hypothyroidism -     levothyroxine (SYNTHROID, LEVOTHROID) 125 MCG tablet; take 1 tablet by mouth every morning ON AN EMPTY STOMACH  Other orders -      Cancel: Pneumococcal conjugate vaccine 13-valent   I am having Mr. Ndiaye maintain his aspirin, fish oil-omega-3 fatty acids, Red Yeast Rice, traMADol, gabapentin, fluticasone, glucosamine-chondroitin, magnesium, and levothyroxine.  Meds ordered this encounter  Medications  . levothyroxine (SYNTHROID, LEVOTHROID) 125 MCG tablet    Sig: take 1 tablet by mouth every morning ON AN EMPTY STOMACH    Dispense:  90 tablet    Refill:  1     Follow-up: Return in about 6 months (around 06/06/2017).  Scarlette Calico, MD

## 2016-12-07 DIAGNOSIS — Z0000 Encounter for general adult medical examination without abnormal findings: Secondary | ICD-10-CM | POA: Insufficient documentation

## 2016-12-15 ENCOUNTER — Ambulatory Visit: Payer: Medicare Other

## 2017-01-23 ENCOUNTER — Other Ambulatory Visit: Payer: Self-pay | Admitting: *Deleted

## 2017-01-23 ENCOUNTER — Telehealth: Payer: Self-pay | Admitting: *Deleted

## 2017-01-23 DIAGNOSIS — M5416 Radiculopathy, lumbar region: Secondary | ICD-10-CM

## 2017-01-23 NOTE — Telephone Encounter (Signed)
Copied from [place]. Topic: Referral - Request >> Jan 23, 2017  2:13 PM Yvette Rack wrote: Reason for CRM: patient wanting Dr Tamala Julian to make a referral for him to go to [place] for an epidural for his back please call pt when referral has been made

## 2017-01-23 NOTE — Telephone Encounter (Signed)
Spoke to pt, entered epidural order & sent to [facility].

## 2017-02-06 ENCOUNTER — Ambulatory Visit
Admission: RE | Admit: 2017-02-06 | Discharge: 2017-02-06 | Disposition: A | Discharge status: Home / Self Care | Payer: Medicare Other | Source: Ambulatory Visit | Attending: Family Medicine | Admitting: Family Medicine

## 2017-02-06 DIAGNOSIS — M5416 Radiculopathy, lumbar region: Secondary | ICD-10-CM

## 2017-02-06 MED ORDER — METHYLPREDNISOLONE ACETATE 40 MG/ML INJ SUSP (RADIOLOG
120.0000 mg | Freq: Once | INTRAMUSCULAR | Status: AC
  Administered 2017-02-06: 120 mg via EPIDURAL

## 2017-02-06 MED ORDER — IOPAMIDOL (ISOVUE-M 200) INJECTION 41%
1.0000 mL | Freq: Once | INTRAMUSCULAR | Status: AC
  Administered 2017-02-06: 1 mL via EPIDURAL

## 2017-02-06 NOTE — Discharge Instructions (Signed)

## 2017-03-15 ENCOUNTER — Ambulatory Visit: Payer: Medicare Other | Admitting: Internal Medicine

## 2017-03-16 ENCOUNTER — Ambulatory Visit: Payer: Medicare Other | Admitting: Internal Medicine

## 2017-05-08 ENCOUNTER — Ambulatory Visit: Payer: Medicare Other | Admitting: Internal Medicine

## 2017-05-15 NOTE — Progress Notes (Signed)
[name] [address] [place], [place] [address] Phone: [phone] Subjective:     CC: Right shoulder, left finger pain  DTO:[id number]  Keith [name] is a 77 y.o. male coming in with complaint of right shoulder pain.  Has had this for quite some time.  It is more of a bursitis.  Has not been injected for many years.  Waking him up at night.  Affecting daily activities.  Even difficulty with dressing.  Does not remember any true injury though.  Left third finger pain.  Has had any arthritic changes with a synovitis.  Responded well to injection.  Last injection was October.  Worsening pain and cannot grip.      Past Medical History:  Diagnosis Date  . Allergic rhinitis   . DDD (degenerative disc disease), cervical   . Hyperlipidemia   . Hypothyroidism    Past Surgical History:  Procedure Laterality Date  . BACK SURGERY  2016  . TONSILLECTOMY     Social History   Socioeconomic History  . Marital status: Married    Spouse name: Not on file  . Number of children: Not on file  . Years of education: Not on file  . Highest education level: Not on file  Occupational History  . 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 Occupation: Designer, industrial/product bus  Social Needs  . Financial resource strain: Not on file  . Food insecurity:    Worry: Not on file    Inability: Not on file  . Transportation needs:    Medical: Not on file    Non-medical: Not on file  Tobacco Use  . Smoking status: Never Smoker  . Smokeless tobacco: Never Used  Substance and Sexual Activity  . Alcohol use: No  . Drug use: No  . Sexual activity: Not Currently  Lifestyle  . Physical activity:    Days per week: Not on file    Minutes per session: Not on file  . Stress: Not on file  Relationships  . Social connections:    Talks on phone: Not on file    Gets together: Not on file    Attends religious service: Not on file    Active member of club  or organization: Not on file    Attends meetings of clubs or organizations: Not on file    Relationship status: Not on file  Other Topics Concern  . Not on file  Social History Narrative   Regular Exercise -  YES         Allergies  Allergen Reactions  . Atorvastatin Other (See Comments)    leg cramps  . Rosuvastatin Other (See Comments)    myalgias   Family History  Problem Relation Age of Onset  . Coronary artery disease Father        Fatal MI  . Coronary artery disease Other   . Breast cancer Other   . Diabetes Father   . Diabetes Mother      Past medical history, social, surgical and family history all reviewed in electronic medical record.  No pertanent information unless stated regarding to the chief complaint.   Review of Systems:Review of systems updated and as  No headache, visual changes, nausea, vomiting, diarrhea, constipation, dizziness, abdominal pain, skin rash, fevers, chills, night sweats, weight loss, swollen lymph nodes, body aches, joint swelling, muscle aches, chest pain, shortness of breath, mood changes.   Objective  Blood pressure 110/80,  pulse 67, height 5\' 9"  (1.753 m), weight 205 lb (93 kg), SpO2 96 %.   General: No apparent distress alert and oriented x3 mood and affect normal, dressed appropriately.  HEENT: Pupils equal, extraocular movements intact  Respiratory: Patient's speak in full sentences and does not appear short of breath  Cardiovascular: No lower extremity edema, non tender, no erythema  Skin: Warm dry intact with no signs of infection or rash on extremities or on axial skeleton.  Abdomen: Soft nontender  Neuro: Cranial nerves II through XII are intact, neurovascularly intact in all extremities with 2+ DTRs and 2+ pulses.  Lymph: No lymphadenopathy of posterior or anterior cervical chain or axillae bilaterally.  Gait normal with good balance and coordination.  MSK:  Non tender with full range of motion and good stability and symmetric  strength and tone of shoulders, elbows, wrist, hip, knee and ankles bilaterally.  Arthritic changes of multiple joints.  Patient does have remote history of amputation of fingers  Hand shows swelling over the third metacarpal.  Does have mild limitation in flexion secondary to this.  Tenderness to palpation.  Shoulder: Right Inspection reveals no abnormalities, atrophy or asymmetry. Palpation is normal with no tenderness over AC joint or bicipital groove. ROM is full in all planes passively. Rotator cuff strength normal throughout. signs of impingement with positive Neer and Hawkin's tests, but negative empty can sign. Speeds and Yergason's tests normal. No labral pathology noted with negative Obrien's, negative clunk and good stability. Normal scapular function observed. No painful arc and no drop arm sign. No apprehension sign  MSK US performed of: Right This study was ordered, performed, and interpreted by Charlann Boxer D.O.  Shoulder:   Supraspinatus: Degenerative tearing noted, Bursal bulge seen with shoulder abduction on impingement view. Infraspinatus:  Appears normal on long and transverse views. Significant increase in Doppler flow Subscapularis:  Appears normal on long and transverse views. Positive bursa Teres Minor:  Appears normal on long and transverse views. AC joint: Arthritic changes Glenohumeral Joint:  Appears normal without effusion. Glenoid Labrum:  Intact without visualized tears. Biceps Tendon:  Appears normal on long and transverse views, no fraying of tendon, tendon located in intertubercular groove, no subluxation with shoulder internal or external rotation.  Impression: Subacromial bursitis  Procedure: Real-time Ultrasound Guided Injection of right glenohumeral joint Device: GE Logiq E  Ultrasound guided injection is preferred based studies that show increased duration, increased effect, greater accuracy, decreased procedural pain, increased response rate with  ultrasound guided versus blind injection.  Verbal informed consent obtained.  Time-out conducted.  Noted no overlying erythema, induration, or other signs of local infection.  Skin prepped in a sterile fashion.  Local anesthesia: Topical Ethyl chloride.  With sterile technique and under real time ultrasound guidance:  Joint visualized.  23g 1  inch needle inserted posterior approach. Pictures taken for needle placement. Patient did have injection of 2 cc of 1% lidocaine, 2 cc of 0.5% Marcaine, and 1.0 cc of Kenalog 40 mg/dL. Completed without difficulty  Pain immediately resolved suggesting accurate placement of the medication.  Advised to call if fevers/chills, erythema, induration, drainage, or persistent bleeding.  Images permanently stored and available for review in the ultrasound unit.  Impression: Technically successful ultrasound guided injection.  Procedure: Real-time Ultrasound Guided Injection of left third metacarpal joint Device: GE Logiq Q7 Ultrasound guided injection is preferred based studies that show increased duration, increased effect, greater accuracy, decreased procedural pain, increased response rate, and decreased cost with ultrasound guided  versus blind injection.  Verbal informed consent obtained.  Time-out conducted.  Noted no overlying erythema, induration, or other signs of local infection.  Skin prepped in a sterile fashion.  Local anesthesia: Topical Ethyl chloride.  With sterile technique and under real time ultrasound guidance: With a 25-gauge half inch needle injected with 0.5 cc of 0.5% Marcaine and 0.5 cc of Kenalog 40 mg/mL Completed without difficulty  Pain immediately resolved suggesting accurate placement of the medication.  Advised to call if fevers/chills, erythema, induration, drainage, or persistent bleeding.  Images permanently stored and available for review in the ultrasound unit.  Impression: Technically successful ultrasound guided  injection.    Impression and Recommendations:     This case required medical decision making of moderate complexity.      Note: This dictation was prepared with Dragon dictation along with smaller phrase technology. Any transcriptional errors that result from this process are unintentional.

## 2017-05-16 ENCOUNTER — Ambulatory Visit: Payer: Medicare Other | Admitting: Family Medicine

## 2017-05-16 ENCOUNTER — Ambulatory Visit: Payer: Self-pay

## 2017-05-16 ENCOUNTER — Encounter: Payer: Self-pay | Admitting: Family Medicine

## 2017-05-16 VITALS — BP 110/80 | HR 67 | Ht 69.0 in | Wt 205.0 lb

## 2017-05-16 DIAGNOSIS — M19049 Primary osteoarthritis, unspecified hand: Secondary | ICD-10-CM

## 2017-05-16 DIAGNOSIS — M12811 Other specific arthropathies, not elsewhere classified, right shoulder: Secondary | ICD-10-CM

## 2017-05-16 DIAGNOSIS — M75101 Unspecified rotator cuff tear or rupture of right shoulder, not specified as traumatic: Secondary | ICD-10-CM

## 2017-05-16 DIAGNOSIS — M79642 Pain in left hand: Secondary | ICD-10-CM | POA: Diagnosis not present

## 2017-05-17 DIAGNOSIS — M19049 Primary osteoarthritis, unspecified hand: Secondary | ICD-10-CM | POA: Insufficient documentation

## 2017-05-17 NOTE — Assessment & Plan Note (Signed)
Nearly 4 years since patient's previous injection.  Given another one today.  Likely will do well.  Discussed icing regimen and home exercises.  Topical anti-inflammatories given.  Follow-up again in 4 weeks

## 2017-05-17 NOTE — Assessment & Plan Note (Signed)
Third metacarpal injected.  Tolerated the procedure well.  Discussed icing regimen and home exercises.  Follow-up again with me in 4-6 weeks if needed

## 2017-06-06 ENCOUNTER — Encounter: Payer: Self-pay | Admitting: Internal Medicine

## 2017-06-06 ENCOUNTER — Other Ambulatory Visit (INDEPENDENT_AMBULATORY_CARE_PROVIDER_SITE_OTHER): Payer: Medicare Other

## 2017-06-06 ENCOUNTER — Ambulatory Visit: Payer: Medicare Other | Admitting: Internal Medicine

## 2017-06-06 VITALS — BP 112/70 | HR 65 | Temp 98.0°F | Ht 69.0 in | Wt 199.0 lb

## 2017-06-06 DIAGNOSIS — E039 Hypothyroidism, unspecified: Secondary | ICD-10-CM | POA: Diagnosis not present

## 2017-06-06 LAB — TSH: TSH: 11.6 u[IU]/mL — AB (ref 0.35–4.50)

## 2017-06-06 MED ORDER — LEVOTHYROXINE SODIUM 137 MCG PO TABS: 137.0000 ug | ORAL_TABLET | Freq: Every day | ORAL | 1 refills | Status: DC

## 2017-06-06 NOTE — Progress Notes (Signed)
Subjective:  Patient ID: Keith [name], male    DOB: Mar 12, 1940  Age: 77 y.o. MRN: [id number]  CC: Hypothyroidism   HPI Keith [name] presents for f/up on hypothyroidism.  Other than weight gain he feels well and offers no other complaints.  Outpatient Medications Prior to Visit  Medication Sig Dispense Refill  . aspirin 81 MG EC tablet Take 81 mg by mouth daily.      . fish oil-omega-3 fatty acids 1000 MG capsule Take 1 g by mouth daily.      . fluticasone (FLONASE) 50 MCG/ACT nasal spray instill 2 sprays into each nostril once daily 16 g 11  . gabapentin (NEURONTIN) 100 MG capsule Take 2 capsules (200 mg total) by mouth at bedtime. 60 capsule 3  . glucosamine-chondroitin 500-400 MG tablet Take 1 tablet by mouth 2 (two) times daily.    . magnesium 30 MG tablet Take 60 mg by mouth daily.    . Red Yeast Rice 600 MG TABS      . traMADol (ULTRAM) 50 MG tablet Take 1 tablet (50 mg total) by mouth every 12 (twelve) hours as needed. 60 tablet 0  . levothyroxine (SYNTHROID, LEVOTHROID) 125 MCG tablet take 1 tablet by mouth every morning ON AN EMPTY STOMACH 90 tablet 1   No facility-administered medications prior to visit.     ROS Review of Systems  Constitutional: Positive for unexpected weight change (wt gain). Negative for diaphoresis and fatigue.  HENT: Negative.   Eyes: Negative for visual disturbance.  Respiratory: Negative for cough, chest tightness, shortness of breath and wheezing.   Cardiovascular: Negative.  Negative for chest pain, palpitations and leg swelling.  Gastrointestinal: Negative for abdominal pain, diarrhea, nausea and vomiting.  Endocrine: Negative for cold intolerance and heat intolerance.  Genitourinary: Negative.  Negative for difficulty urinating, dysuria and urgency.  Musculoskeletal: Negative.  Negative for back pain, myalgias and neck pain.  Skin: Negative.  Negative for color change.  Allergic/Immunologic: Negative.   Neurological: Negative.  Negative for  dizziness, weakness and light-headedness.  Hematological: Negative for adenopathy. Does not bruise/bleed easily.  Psychiatric/Behavioral: Negative.     Objective:  BP 112/70 (BP Location: Left Arm, Patient Position: Sitting, Cuff Size: Normal)   Pulse 65   Temp 98 F (36.7 C) (Oral)   Ht 5\' 9"  (1.753 m)   Wt 199 lb (90.3 kg)   SpO2 96%   BMI 29.39 kg/m   BP Readings from Last 3 Encounters:  06/06/17 112/70  05/16/17 110/80  02/06/17 (!) 146/93    Wt Readings from Last 3 Encounters:  06/06/17 199 lb (90.3 kg)  05/16/17 205 lb (93 kg)  12/06/16 201 lb (91.2 kg)    Physical Exam  Constitutional: He is oriented to person, place, and time. No distress.  HENT:  Mouth/Throat: Oropharynx is clear and moist. No oropharyngeal exudate.  Eyes: Conjunctivae are normal. No scleral icterus.  Neck: Normal range of motion. Neck supple. No JVD present. No thyroid mass and no thyromegaly present.  Cardiovascular: Normal rate, regular rhythm and normal heart sounds. Exam reveals no gallop and no friction rub.  No murmur heard. Pulmonary/Chest: Effort normal and breath sounds normal. No stridor. No respiratory distress. He has no wheezes.  Abdominal: Soft. Bowel sounds are normal. He exhibits no mass. There is no tenderness.  Musculoskeletal: Normal range of motion. He exhibits no edema, tenderness or deformity.  Lymphadenopathy:    He has no cervical adenopathy.  Neurological: He is alert and oriented to person,  place, and time.  Skin: Skin is warm and dry. He is not diaphoretic.  Vitals reviewed.   Lab Results  Component Value Date   WBC 11.3 (H) 12/06/2016   HGB 16.0 12/06/2016   HCT 48.5 12/06/2016   PLT 202.0 12/06/2016   GLUCOSE 85 12/06/2016   CHOL 183 12/06/2016   TRIG 128.0 12/06/2016   HDL 35.90 (L) 12/06/2016   LDLCALC 122 (H) 12/06/2016   ALT 15 12/06/2016   AST 16 12/06/2016   NA 138 12/06/2016   K 4.4 12/06/2016   CL 103 12/06/2016   CREATININE 0.99 12/06/2016     BUN 15 12/06/2016   CO2 29 12/06/2016   TSH 11.60 (H) 06/06/2017   PSA 3.18 12/06/2016    Dg Epidural/nerve Root  Result Date: 02/06/2017 CLINICAL DATA:  Spondylosis without myelopathy. Patient describes bilateral leg pain, equal from right to left. EXAM: LUMBAR EPIDURAL INJECTION DIAGNOSTIC EPIDURAL INJECTION THERAPEUTIC EPIDURAL INJECTION COMPARISON:  01/13/2016. PROCEDURE: The procedure, risks, benefits, and alternatives were explained to the patient. Questions regarding the procedure were encouraged and answered. The patient understands and consents to the procedure. LUMBAR EPIDURAL INJECTION An interlaminar approach was performed on the left at L5. The overlying skin was cleansed with Betadine, draped in sterile fashion, and anesthetized using 1% Lidocaine. A 20 gauge spinal needle was advanced using loss-of-resistance technique. DIAGNOSTIC EPIDURAL INJECTION Injection of Omnipaque 180 shows a good epidural pattern with spread above and below the level of needle placement, primarily on the left, but to both sides. No vascular opacification is seen. Pattern of spread seems normal. THERAPEUTIC EPIDURAL INJECTION One hundred twentymg of Depo-Medrol mixed with 2.5 cc 1% lidocaine were instilled. The procedure was well-tolerated, and the patient was discharged thirty minutes following the injection in good condition. FLUOROSCOPY TIME:  0 minutes 21 seconds. 29.97 micro gray meter squared IMPRESSION: Technically successful  epidural injection on the left at L5. Electronically Signed   By: Nelson Chimes M.D.   On: 02/06/2017 11:44    Assessment & Plan:   [name] was seen today for hypothyroidism.  Diagnoses and all orders for this visit:  Acquired hypothyroidism- His TSH is up to 11.6 and he complains of weight gain.  Will increase his dose of levothyroxine from 125 mcg a day to 137 mcg a day. -     TSH; Future -     levothyroxine (SYNTHROID) 137 MCG tablet; Take 1 tablet (137 mcg total) by mouth  daily before breakfast.   I have discontinued [name]'s levothyroxine. I am also having him start on levothyroxine. Additionally, I am having him maintain his aspirin, fish oil-omega-3 fatty acids, Red Yeast Rice, traMADol, gabapentin, fluticasone, glucosamine-chondroitin, and magnesium.  Meds ordered this encounter  Medications  . levothyroxine (SYNTHROID) 137 MCG tablet    Sig: Take 1 tablet (137 mcg total) by mouth daily before breakfast.    Dispense:  90 tablet    Refill:  1     Follow-up: Return in about 6 months (around 12/06/2017).  Scarlette Calico, MD

## 2017-06-06 NOTE — Patient Instructions (Signed)
Hypothyroidism Hypothyroidism is a disorder of the thyroid. The thyroid is a large gland that is located in the lower front of the neck. The thyroid releases hormones that control how the body works. With hypothyroidism, the thyroid does not make enough of these hormones. What are the causes? Causes of hypothyroidism may include:  Viral infections.  Pregnancy.  Your own defense system (immune system) attacking your thyroid.  Certain medicines.  Birth defects.  Past radiation treatments to your head or neck.  Past treatment with radioactive iodine.  Past surgical removal of part or all of your thyroid.  Problems with the gland that is located in the center of your brain (pituitary).  What are the signs or symptoms? Signs and symptoms of hypothyroidism may include:  Feeling as though you have no energy (lethargy).  Inability to tolerate cold.  Weight gain that is not explained by a change in diet or exercise habits.  Dry skin.  Coarse hair.  Menstrual irregularity.  Slowing of thought processes.  Constipation.  Sadness or depression.  How is this diagnosed? Your health care provider may diagnose hypothyroidism with blood tests and ultrasound tests. How is this treated? Hypothyroidism is treated with medicine that replaces the hormones that your body does not make. After you begin treatment, it may take several weeks for symptoms to go away. Follow these instructions at home:  Take medicines only as directed by your health care provider.  If you start taking any new medicines, tell your health care provider.  Keep all follow-up visits as directed by your health care provider. This is important. As your condition improves, your dosage needs may change. You will need to have blood tests regularly so that your health care provider can watch your condition. Contact a health care provider if:  Your symptoms do not get better with treatment.  You are taking thyroid  replacement medicine and: ? You sweat excessively. ? You have tremors. ? You feel anxious. ? You lose weight rapidly. ? You cannot tolerate heat. ? You have emotional swings. ? You have diarrhea. ? You feel weak. Get help right away if:  You develop chest pain.  You develop an irregular heartbeat.  You develop a rapid heartbeat. This information is not intended to replace advice given to you by your health care provider. Make sure you discuss any questions you have with your health care provider. Document Released: 01/24/2005 Document Revised: 07/02/2015 Document Reviewed: 06/11/2013 Elsevier Interactive Patient Education  2018 [employer] Inc.  

## 2017-07-25 ENCOUNTER — Telehealth: Payer: Self-pay | Admitting: Emergency Medicine

## 2017-07-25 NOTE — Telephone Encounter (Signed)
Called patient to schedule AWV. Patient declined at this time. 

## 2017-08-21 ENCOUNTER — Ambulatory Visit: Payer: Medicare Other | Admitting: Family

## 2017-08-21 ENCOUNTER — Encounter: Payer: Self-pay | Admitting: Family

## 2017-08-21 ENCOUNTER — Other Ambulatory Visit (INDEPENDENT_AMBULATORY_CARE_PROVIDER_SITE_OTHER): Payer: Medicare Other

## 2017-08-21 VITALS — BP 114/80 | HR 66 | Temp 97.9°F | Ht 69.0 in | Wt 200.1 lb

## 2017-08-21 DIAGNOSIS — E039 Hypothyroidism, unspecified: Secondary | ICD-10-CM

## 2017-08-21 DIAGNOSIS — B370 Candidal stomatitis: Secondary | ICD-10-CM | POA: Diagnosis not present

## 2017-08-21 LAB — TSH: TSH: 1.48 u[IU]/mL (ref 0.35–4.50)

## 2017-08-21 MED ORDER — NYSTATIN 100000 UNIT/ML MT SUSP: 5.0000 mL | Freq: Four times a day (QID) | OROMUCOSAL | 0 refills | Status: DC

## 2017-08-21 MED ORDER — AMOXICILLIN-POT CLAVULANATE 875-125 MG PO TABS: 1.0000 | ORAL_TABLET | Freq: Two times a day (BID) | ORAL | 0 refills | Status: DC

## 2017-08-21 NOTE — Progress Notes (Signed)
Keith [name] is a 77 y.o. male with the following history as recorded in [employer]:  Patient Active Problem List   Diagnosis Date Noted  . Routine general medical examination at a health care facility 12/07/2016  . Iliotibial band syndrome affecting right lower leg 03/12/2015  . Senile purpura ([place]) 01/14/2015  . Benign paroxysmal positional vertigo 11/04/2014  . Arthritis of sacroiliac joint 08/27/2014  . Foot drop, right 05/27/2014  . Renal cyst 05/11/2014  . Right lumbar radiculopathy 03/10/2014  . Rotator cuff tear arthropathy of right shoulder 10/31/2013  . CMC arthritis, thumb, degenerative 07/24/2013  . Prostate nodule 12/15/2011  . Osteoarthritis of hip 10/06/2009  . Hypothyroidism 10/15/2008  . Hyperlipidemia with target LDL less than 130 03/03/2008  . ALLERGIC RHINITIS 03/03/2008    Current Outpatient Medications  Medication Sig Dispense Refill  . amoxicillin-clavulanate (AUGMENTIN) 875-125 MG tablet Take 1 tablet by mouth 2 (two) times daily. 20 tablet 0  . aspirin 81 MG EC tablet Take 81 mg by mouth daily.      . fish oil-omega-3 fatty acids 1000 MG capsule Take 1 g by mouth daily.      . fluticasone (FLONASE) 50 MCG/ACT nasal spray instill 2 sprays into each nostril once daily 16 g 11  . gabapentin (NEURONTIN) 100 MG capsule Take 2 capsules (200 mg total) by mouth at bedtime. 60 capsule 3  . glucosamine-chondroitin 500-400 MG tablet Take 1 tablet by mouth 2 (two) times daily.    Marland Kitchen levothyroxine (SYNTHROID) 137 MCG tablet Take 1 tablet (137 mcg total) by mouth daily before breakfast. 90 tablet 1  . magnesium 30 MG tablet Take 60 mg by mouth daily.    Marland Kitchen nystatin (MYCOSTATIN) 100000 UNIT/ML suspension Take 5 mLs (500,000 Units total) by mouth 4 (four) times daily. 100 mL 0  . Red Yeast Rice 600 MG TABS      . traMADol (ULTRAM) 50 MG tablet Take 1 tablet (50 mg total) by mouth every 12 (twelve) hours as needed. 60 tablet 0   No current facility-administered medications for  this visit.     Allergies: Atorvastatin and Rosuvastatin  Past Medical History:  Diagnosis Date  . Allergic rhinitis   . DDD (degenerative disc disease), cervical   . Hyperlipidemia   . Hypothyroidism     Past Surgical History:  Procedure Laterality Date  . BACK SURGERY  2016  . TONSILLECTOMY      Family History  Problem Relation Age of Onset  . Coronary artery disease Father        Fatal MI  . Coronary artery disease Other   . Breast cancer Other   . Diabetes Father   . Diabetes Mother     Social History   Tobacco Use  . Smoking status: Never Smoker  . Smokeless tobacco: Never Used  Substance Use Topics  . Alcohol use: No    Subjective:  Patient notes that he bit his tongue last week and is concerned he has developed a secondary infection; he states his tongue was "green" last week and he started to swish with oral hydrogen peroxide which has offered some benefit; he notes his tongue was improving but he was concerned about some pain in his gums; admits that does not see his dentist regularly; denies any fever or difficulty swallowing;    Objective:  Vitals:   08/21/17 1410  BP: 114/80  Pulse: 66  Temp: 97.9 F (36.6 C)  TempSrc: Oral  SpO2: 96%  Weight: 200 lb 1.3  oz (90.8 kg)  Height: 5\' 9"  (1.753 m)    General: Well developed, well nourished, in no acute distress  Skin : Warm and dry.  Head: Normocephalic and atraumatic  Eyes: Sclera and conjunctiva clear; pupils round and reactive to light; extraocular movements intact  Ears: External normal; canals clear; tympanic membranes normal  Oropharynx: Pink, supple. No suspicious lesions; yellowish/ whitish coating on tongue; no swelling noted on right side of mouth;   Neck: Supple without thyromegaly, adenopathy  Lungs: Respirations unlabored; clear to auscultation bilaterally without wheeze, rales, rhonchi  Vessels: Symmetric bilaterally  Neurologic: Alert and oriented; speech intact; face symmetrical; moves all  extremities well; CNII-XII intact without focal deficit   Assessment:  1. Hypothyroidism, unspecified type   2. Oral thrush     Plan:  1. Update TSH today; may need to increase Synthroid; follow-up to be determined. 2. Reassurance that no obvious infection; try treating with Oral Nystatin; he is given Rx for Augmentin to hold and fill if symptoms worsen by the end of the week; stressed need to go to dentist;   No follow-ups on file.  Orders Placed This Encounter  Procedures  . TSH    Standing Status:   Future    Number of Occurrences:   1    Standing Expiration Date:   08/21/2018    Requested Prescriptions   Signed Prescriptions Disp Refills  . nystatin (MYCOSTATIN) 100000 UNIT/ML suspension 100 mL 0    Sig: Take 5 mLs (500,000 Units total) by mouth 4 (four) times daily.  Marland Kitchen amoxicillin-clavulanate (AUGMENTIN) 875-125 MG tablet 20 tablet 0    Sig: Take 1 tablet by mouth 2 (two) times daily.

## 2017-09-04 NOTE — Progress Notes (Signed)
[name] [address], [place] [address] Phone: [phone] Subjective:     CC: right shoulder pain   [id number]  Keith [name] is a 77 y.o. male coming in with complaint of right shoulder and left hand pain. His pain increased 3 weeks ago. Had injections last visit in April. Having a decrease in strength in the left hand.  Patient has arthritis of the third MCP joint.  Has responded well to injections.  Rotator cuff arthropathy of the right shoulder.  Worsening pain.  Has no moderate to severe arthritis.     Past Medical History:  Diagnosis Date  . Allergic rhinitis   . DDD (degenerative disc disease), cervical   . Hyperlipidemia   . Hypothyroidism    Past Surgical History:  Procedure Laterality Date  . BACK SURGERY  2016  . TONSILLECTOMY     Social History   Socioeconomic History  . Marital status: Married    Spouse name: Not on file  . Number of children: Not on file  . Years of education: Not on file  . Highest education level: Not on file  Occupational History  . 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 Occupation: Designer, industrial/product bus  Social Needs  . Financial resource strain: Not on file  . Food insecurity:    Worry: Not on file    Inability: Not on file  . Transportation needs:    Medical: Not on file    Non-medical: Not on file  Tobacco Use  . Smoking status: Never Smoker  . Smokeless tobacco: Never Used  Substance and Sexual Activity  . Alcohol use: No  . Drug use: No  . Sexual activity: Not Currently  Lifestyle  . Physical activity:    Days per week: Not on file    Minutes per session: Not on file  . Stress: Not on file  Relationships  . Social connections:    Talks on phone: Not on file    Gets together: Not on file    Attends religious service: Not on file    Active member of club or organization: Not on file    Attends meetings of clubs or organizations: Not on file   Relationship status: Not on file  Other Topics Concern  . Not on file  Social History Narrative   Regular Exercise -  YES         Allergies  Allergen Reactions  . Atorvastatin Other (See Comments)    leg cramps  . Rosuvastatin Other (See Comments)    myalgias   Family History  Problem Relation Age of Onset  . Coronary artery disease Father        Fatal MI  . Coronary artery disease Other   . Breast cancer Other   . Diabetes Father   . Diabetes Mother      Past medical history, social, surgical and family history all reviewed in electronic medical record.  No pertanent information unless stated regarding to the chief complaint.   Review of Systems:Review of systems updated and as accurate as of 09/06/17  No headache, visual changes, nausea, vomiting, diarrhea, constipation, dizziness, abdominal pain, skin rash, fevers, chills, night sweats, weight loss, swollen lymph nodes, body aches, , chest pain, shortness of breath, mood changes.  Positive muscle aches joint swelling  Objective  Blood pressure 110/62, pulse 67, height 5\' 9"  (1.753 m), weight 200 lb (90.7 kg), SpO2 97 %. Systems examined  below as of 09/06/17   General: No apparent distress alert and oriented x3 mood and affect normal, dressed appropriately.  HEENT: Pupils equal, extraocular movements intact  Respiratory: Patient's speak in full sentences and does not appear short of breath  Cardiovascular: No lower extremity edema, non tender, no erythema  Skin: Warm dry intact with no signs of infection or rash on extremities or on axial skeleton.  Abdomen: Soft nontender  Neuro: Cranial nerves II through XII are intact, neurovascularly intact in all extremities with 2+ DTRs and 2+ pulses.  Lymph: No lymphadenopathy of posterior or anterior cervical chain or axillae bilaterally.  Gait normal with good balance and coordination.  MSK:  tender with full range of motion and good stability and symmetric strength and tone of  , elbows, wrist, hip, knee and ankles bilaterally.  There are changes of multiple joints patient also has 3 fingers with partial amputations of the right hand  Left hand shows severe swelling over the third MCP joint.  Loss of range of motion.  Tender to palpation.  Neurovascularly intact distally.  Right shoulder shows atrophy of the musculature.  Crepitus with range of motion.  4 out of 5 strength of the rotator cuff.  Positive crossover test.  Procedure: Real-time Ultrasound Guided Injection of right glenohumeral joint Device: GE Logiq Q7  Ultrasound guided injection is preferred based studies that show increased duration, increased effect, greater accuracy, decreased procedural pain, increased response rate with ultrasound guided versus blind injection.  Verbal informed consent obtained.  Time-out conducted.  Noted no overlying erythema, induration, or other signs of local infection.  Skin prepped in a sterile fashion.  Local anesthesia: Topical Ethyl chloride.  With sterile technique and under real time ultrasound guidance:  Joint visualized.  23g 1  inch needle inserted posterior approach. Pictures taken for needle placement. Patient did have injection of 2 cc of 1% lidocaine, 2 cc of 0.5% Marcaine, and 1.0 cc of Kenalog 40 mg/dL. Completed without difficulty  Pain immediately resolved suggesting accurate placement of the medication.  Advised to call if fevers/chills, erythema, induration, drainage, or persistent bleeding.  Images permanently stored and available for review in the ultrasound unit.  Impression: Technically successful ultrasound guided injection.  Procedure: Real-time Ultrasound Guided Injection of left third MCP joint Device: GE Logiq Q7 Ultrasound guided injection is preferred based studies that show increased duration, increased effect, greater accuracy, decreased procedural pain, increased response rate, and decreased cost with ultrasound guided versus blind  injection.  Verbal informed consent obtained.  Time-out conducted.  Noted no overlying erythema, induration, or other signs of local infection.  Skin prepped in a sterile fashion.  Local anesthesia: Topical Ethyl chloride.  With sterile technique and under real time ultrasound guidance: 25-gauge 1 inch needle patient was injected with 0.5 cc of 0.5% Marcaine and 0.5 cc of Kenalog 40 mg/mL into the joint Completed without difficulty  Pain immediately resolved suggesting accurate placement of the medication.  Advised to call if fevers/chills, erythema, induration, drainage, or persistent bleeding.  Images permanently stored and available for review in the ultrasound unit.  Impression: Technically successful ultrasound guided injection.    Impression and Recommendations:     This case required medical decision making of moderate complexity.      Note: This dictation was prepared with Dragon dictation along with smaller phrase technology. Any transcriptional errors that result from this process are unintentional.

## 2017-09-06 ENCOUNTER — Ambulatory Visit: Payer: Self-pay

## 2017-09-06 ENCOUNTER — Ambulatory Visit: Payer: Medicare Other | Admitting: Family Medicine

## 2017-09-06 ENCOUNTER — Encounter: Payer: Self-pay | Admitting: Family Medicine

## 2017-09-06 VITALS — BP 110/62 | HR 67 | Ht 69.0 in | Wt 200.0 lb

## 2017-09-06 DIAGNOSIS — M25511 Pain in right shoulder: Secondary | ICD-10-CM | POA: Diagnosis not present

## 2017-09-06 DIAGNOSIS — M659 Synovitis and tenosynovitis, unspecified: Secondary | ICD-10-CM | POA: Diagnosis not present

## 2017-09-06 DIAGNOSIS — G8929 Other chronic pain: Secondary | ICD-10-CM

## 2017-09-06 DIAGNOSIS — M12811 Other specific arthropathies, not elsewhere classified, right shoulder: Secondary | ICD-10-CM | POA: Diagnosis not present

## 2017-09-06 DIAGNOSIS — M75101 Unspecified rotator cuff tear or rupture of right shoulder, not specified as traumatic: Secondary | ICD-10-CM | POA: Diagnosis not present

## 2017-09-06 DIAGNOSIS — M65949 Unspecified synovitis and tenosynovitis, unspecified hand: Secondary | ICD-10-CM

## 2017-09-06 NOTE — Patient Instructions (Signed)
Good to see you  Sorry I can only make this bearable for some time.  We will get a new xray of the shoulder and compare to 2015.  Surgery would likely be a shoulder replacement.  pennsaid pinkie amount topically 2 times daily as needed.   See me again in 3 months

## 2017-09-06 NOTE — Assessment & Plan Note (Signed)
Repeat injection given again today.  Tolerated the procedure well.  Follow-up again in 10 weeks

## 2017-09-06 NOTE — Assessment & Plan Note (Signed)
Injection given.  Worsening pain.  Patient does have significant arthritic changes and we will get an x-ray to see how much of this progress.  If no significant progression could consider MRI and then consider the possibility of rotator cuff repair which I think is highly unlikely.  Patient likely looking a more above surgical intervention for shoulder replacement.  Patient is not willing to do that at this time.  Follow-up again in 10 weeks

## 2017-09-25 NOTE — Progress Notes (Signed)
[name] Keith [name], [place] [address] Phone: [phone] Subjective:     CC: Right hip pain  [id number]  Keith [name] is a 77 y.o. male coming in with complaint of right hip pain for one week. He has pain throughout the night. Pain is in the right glute. Dull achy in character. Being active seems to decrease pain. Did have some radiating pain last week. He had a hard time walking across his hallway due to the radiating pain.  Past medical history is significant for an L4 nerve root on the right side and did respond well to injection.  Patient is even had foot drop previously this leg.  Noticing some mild increase in weakness of the leg as well recently.     Past Medical History:  Diagnosis Date  . Allergic rhinitis   . DDD (degenerative disc disease), cervical   . Hyperlipidemia   . Hypothyroidism    Past Surgical History:  Procedure Laterality Date  . BACK SURGERY  2016  . TONSILLECTOMY     Social History   Socioeconomic History  . Marital status: Married    Spouse name: Not on file  . Number of children: Not on file  . Years of education: Not on file  . Highest education level: Not on file  Occupational History  . 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 Occupation: Designer, industrial/product bus  Social Needs  . Financial resource strain: Not on file  . Food insecurity:    Worry: Not on file    Inability: Not on file  . Transportation needs:    Medical: Not on file    Non-medical: Not on file  Tobacco Use  . Smoking status: Never Smoker  . Smokeless tobacco: Never Used  Substance and Sexual Activity  . Alcohol use: No  . Drug use: No  . Sexual activity: Not Currently  Lifestyle  . Physical activity:    Days per week: Not on file    Minutes per session: Not on file  . Stress: Not on file  Relationships  . Social connections:    Talks on phone: Not on file    Gets together: Not on file   Attends religious service: Not on file    Active member of club or organization: Not on file    Attends meetings of clubs or organizations: Not on file    Relationship status: Not on file  Other Topics Concern  . Not on file  Social History Narrative   Regular Exercise -  YES         Allergies  Allergen Reactions  . Atorvastatin Other (See Comments)    leg cramps  . Rosuvastatin Other (See Comments)    myalgias   Family History  Problem Relation Age of Onset  . Coronary artery disease Father        Fatal MI  . Coronary artery disease Other   . Breast cancer Other   . Diabetes Father   . Diabetes Mother      Past medical history, social, surgical and family history all reviewed in electronic medical record.  No pertanent information unless stated regarding to the chief complaint.   Review of Systems:Review of systems updated and as accurate as of 09/26/17  No headache, visual changes, nausea, vomiting, diarrhea, constipation, dizziness, abdominal pain, skin rash, fevers, chills, night sweats, weight loss, swollen lymph nodes, body aches, joint swelling, muscle aches, chest  pain, shortness of breath, mood changes.  Positive muscle aches  Objective  Blood pressure 122/80, pulse 67, height 5\' 9"  (1.753 m), SpO2 93 %. Systems examined below as of 09/26/17   General: No apparent distress alert and oriented x3 mood and affect normal, dressed appropriately.  HEENT: Pupils equal, extraocular movements intact  Respiratory: Patient's speak in full sentences and does not appear short of breath  Cardiovascular: No lower extremity edema, non tender, no erythema  Skin: Warm dry intact with no signs of infection or rash on extremities or on axial skeleton.  Abdomen: Soft nontender  Neuro: Cranial nerves II through XII are intact, neurovascularly intact in all extremities with 2+ DTRs and 2+ pulses.  Lymph: No lymphadenopathy of posterior or anterior cervical chain or axillae bilaterally.   Gait antalgic MSK:  tender with full range of motion and good stability and symmetric strength and tone of shoulders, elbows, wrist, hip, knee and ankles bilaterally.  Multiple amputations partial of the fingers.  Back Exam:  Inspection: Loss of lordosis with some mild degenerative scoliosis Motion: Flexion 40 deg, Extension 25 deg, Side Bending to 45 deg bilaterally,  Rotation to 45 deg bilaterally  SLR laying: Mild positive on the right side causing cramping of the leg XSLR laying: Negative  Palpable tenderness: Tender to palpation in the gluteal area on the right. FABER: Positive on the right severe tenderness over the gluteal area. Sensory change: Gross sensation intact to all lumbar and sacral dermatomes.  Reflexes: 2+ at both patellar tendons, 2+ at achilles tendons, Babinski's downgoing.  Strength at foot  Gait unremarkable.  After verbal consent patient was prepped with alcohol swab and with a 21-gauge 2 inch needle was injected into the insertional area of the right gluteal tendon.  Total of 1 cc of 0.5% Marcaine and 1 cc of Kenalog 40 mg/mL.  No blood loss.  Postinjection instructions given    Impression and Recommendations:     This case required medical decision making of moderate complexity.      Note: This dictation was prepared with Dragon dictation along with smaller phrase technology. Any transcriptional errors that result from this process are unintentional.

## 2017-09-26 ENCOUNTER — Encounter: Payer: Self-pay | Admitting: Family Medicine

## 2017-09-26 ENCOUNTER — Ambulatory Visit: Payer: Medicare Other | Admitting: Family Medicine

## 2017-09-26 VITALS — BP 122/80 | HR 67 | Ht 69.0 in

## 2017-09-26 DIAGNOSIS — M7601 Gluteal tendinitis, right hip: Secondary | ICD-10-CM

## 2017-09-26 DIAGNOSIS — M5416 Radiculopathy, lumbar region: Secondary | ICD-10-CM | POA: Diagnosis not present

## 2017-09-26 NOTE — Assessment & Plan Note (Signed)
Likely contributing to most of the discomfort and pain.  Discussed with patient in great length.  I do feel that if this and gluteal tendon does not improve the patient does need to have the nerve root injection that helped nearly 2 years ago.  Patient is in agreement with the plan.  Will be following up as patient instructions suggest.  No new medications

## 2017-09-26 NOTE — Patient Instructions (Addendum)
Good to see you  Keith [name] is your friend [name] a glute tendon injection today  If not better by Friday call [phone] and schedule the epidural  If you have the epidural then see me again 3 weeks after the injection  Otherwise see me when you need me

## 2017-09-26 NOTE — Assessment & Plan Note (Signed)
Patient given injection.  Tolerated procedure well.  I do not believe that this is likely the primary cause though.  I am concerned for more of the nerve root impingement that has been seen previously.  Patient if does not have significant improvement will be sent for an epidural.  If patient does go through with the epidural I would like to see patient again in 3 weeks afterwards.

## 2017-10-12 ENCOUNTER — Ambulatory Visit
Admission: RE | Admit: 2017-10-12 | Discharge: 2017-10-12 | Disposition: A | Discharge status: Home / Self Care | Payer: Medicare Other | Source: Ambulatory Visit | Attending: Family Medicine | Admitting: Family Medicine

## 2017-10-12 DIAGNOSIS — M5416 Radiculopathy, lumbar region: Secondary | ICD-10-CM

## 2017-10-12 MED ORDER — METHYLPREDNISOLONE ACETATE 40 MG/ML INJ SUSP (RADIOLOG
120.0000 mg | Freq: Once | INTRAMUSCULAR | Status: AC
  Administered 2017-10-12: 120 mg via EPIDURAL

## 2017-10-12 MED ORDER — IOPAMIDOL (ISOVUE-M 200) INJECTION 41%
1.0000 mL | Freq: Once | INTRAMUSCULAR | Status: AC
  Administered 2017-10-12: 1 mL via EPIDURAL

## 2017-10-13 ENCOUNTER — Ambulatory Visit (INDEPENDENT_AMBULATORY_CARE_PROVIDER_SITE_OTHER)
Admission: RE | Admit: 2017-10-13 | Discharge: 2017-10-13 | Disposition: A | Discharge status: Home / Self Care | Payer: Medicare Other | Source: Ambulatory Visit | Attending: Family Medicine | Admitting: Family Medicine

## 2017-10-13 ENCOUNTER — Ambulatory Visit: Payer: Medicare Other | Admitting: Family Medicine

## 2017-10-13 ENCOUNTER — Encounter: Payer: Self-pay | Admitting: Family Medicine

## 2017-10-13 VITALS — BP 118/76 | HR 79 | Ht 69.0 in

## 2017-10-13 DIAGNOSIS — M25551 Pain in right hip: Secondary | ICD-10-CM | POA: Diagnosis not present

## 2017-10-13 MED ORDER — PREDNISONE 5 MG PO TABS: ORAL_TABLET | ORAL | 0 refills | Status: DC

## 2017-10-13 NOTE — Patient Instructions (Signed)
Nice to meet you  Please try the medication  I will call you with the results  Please let us know if your symptoms do not improve.

## 2017-10-13 NOTE — Progress Notes (Signed)
Keith [name] - 77 y.o. male MRN [id number]  Date of birth: 1940/07/25  SUBJECTIVE:  Including CC & ROS.  Chief Complaint  Patient presents with  . Follow-up    Keith [name] is a 77 y.o. male that is here today for right leg pain. He was seen on 09/26/17 and given an injection for right gluteal tendinitis, which did not improve his pain. Pain is mild to severe localized to his quadriceps and IT band.  Walking and standing worsens the pain. He received an Epidural injection yesterday for lumbar radiculopathy.  The epidural did not improve his symptoms either.  The pain is occurring on the lateral aspect of his right leg overlying the vastus lateralis.  History of sciatic nerve surgery 2016.  Review of the lumbar MRI from 2016 shows L2-L3 disc collapse which is new.  Shows L4-L5 chronic anterior listhesis and disc degeneration and stenosis.   Review of Systems  Constitutional: Negative for fever.  HENT: Negative for congestion.   Respiratory: Negative for cough.   Cardiovascular: Negative for chest pain.  Gastrointestinal: Negative for abdominal pain.  Musculoskeletal: Positive for arthralgias.  Skin: Negative for color change.  Neurological: Negative for weakness.  Hematological: Negative for adenopathy.  Psychiatric/Behavioral: Negative for agitation.    HISTORY: Past Medical, Surgical, Social, and Family History Reviewed & Updated per EMR.   Pertinent Historical Findings include:  Past Medical History:  Diagnosis Date  . Allergic rhinitis   . DDD (degenerative disc disease), cervical   . Hyperlipidemia   . Hypothyroidism     Past Surgical History:  Procedure Laterality Date  . BACK SURGERY  2016  . TONSILLECTOMY      Allergies  Allergen Reactions  . Atorvastatin Other (See Comments)    leg cramps  . Rosuvastatin Other (See Comments)    myalgias    Family History  Problem Relation Age of Onset  . Coronary artery disease Father        Fatal MI  . Coronary artery  disease Other   . Breast cancer Other   . Diabetes Father   . Diabetes Mother      Social History   Socioeconomic History  . Marital status: Married    Spouse name: Not on file  . Number of children: Not on file  . Years of education: Not on file  . Highest education level: Not on file  Occupational History  . 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 Occupation: Designer, industrial/product bus  Social Needs  . Financial resource strain: Not on file  . Food insecurity:    Worry: Not on file    Inability: Not on file  . Transportation needs:    Medical: Not on file    Non-medical: Not on file  Tobacco Use  . Smoking status: Never Smoker  . Smokeless tobacco: Never Used  Substance and Sexual Activity  . Alcohol use: No  . Drug use: No  . Sexual activity: Not Currently  Lifestyle  . Physical activity:    Days per week: Not on file    Minutes per session: Not on file  . Stress: Not on file  Relationships  . Social connections:    Talks on phone: Not on file    Gets together: Not on file    Attends religious service: Not on file    Active member of club or organization: Not on file    Attends meetings of clubs or organizations: Not on file  Relationship status: Not on file  . Intimate partner violence:    Fear of current or ex partner: Not on file    Emotionally abused: Not on file    Physically abused: Not on file    Forced sexual activity: Not on file  Other Topics Concern  . Not on file  Social History Narrative   Regular Exercise -  YES           PHYSICAL EXAM:  VS: BP 118/76   Pulse 79   Ht 5\' 9"  (1.753 m)   SpO2 97%   BMI 29.53 kg/m  Physical Exam Gen: NAD, alert, cooperative with exam, well-appearing ENT: normal lips, normal nasal mucosa,  Eye: normal EOM, normal conjunctiva and lids CV:  no edema, +2 pedal pulses   Resp: no accessory muscle use, non-labored,  Skin: no rashes, no areas of induration  Neuro: normal tone, normal  sensation to touch Psych:  normal insight, alert and oriented MSK:  Back\Right hip: No tenderness to palpation of the SI joints or midline spine. No tenderness palpation of the greater trochanter or piriformis. Normal internal and external rotation of the hip. Negative straight leg raise bilaterally. Reflexes are symmetric. Normal strength to resistance with knee flexion extension, hip flexion. Neurovascular intact     ASSESSMENT & PLAN:   Right hip pain Pain is ongoing without improvement of the previous gluteal injection and epidural.  Possibly related to the intra-articular problem versus lateral femoral cutaneous nerve irritation.  He is always received an improvement of his symptoms with the epidural whereas today he has not. -X-ray of the right hip. -Prednisone. -If no improvement may need to consider an injection of the lateral femoral cutaneous nerve.

## 2017-10-16 ENCOUNTER — Other Ambulatory Visit: Payer: Self-pay | Admitting: *Deleted

## 2017-10-16 DIAGNOSIS — M25551 Pain in right hip: Secondary | ICD-10-CM | POA: Insufficient documentation

## 2017-10-16 DIAGNOSIS — M5416 Radiculopathy, lumbar region: Secondary | ICD-10-CM

## 2017-10-16 NOTE — Assessment & Plan Note (Signed)
Pain is ongoing without improvement of the previous gluteal injection and epidural.  Possibly related to the intra-articular problem versus lateral femoral cutaneous nerve irritation.  He is always received an improvement of his symptoms with the epidural whereas today he has not. -X-ray of the right hip. -Prednisone. -If no improvement may need to consider an injection of the lateral femoral cutaneous nerve.

## 2017-10-17 ENCOUNTER — Telehealth: Payer: Self-pay | Admitting: Family Medicine

## 2017-10-17 ENCOUNTER — Ambulatory Visit
Admission: RE | Admit: 2017-10-17 | Discharge: 2017-10-17 | Disposition: A | Discharge status: Home / Self Care | Payer: Medicare Other | Source: Ambulatory Visit | Attending: Family Medicine | Admitting: Family Medicine

## 2017-10-17 DIAGNOSIS — M5416 Radiculopathy, lumbar region: Secondary | ICD-10-CM

## 2017-10-17 NOTE — Telephone Encounter (Signed)
Spoke with patient about Pecola Leisure, MD [facility] 10/17/2017, 10:21 AM

## 2017-10-18 ENCOUNTER — Other Ambulatory Visit: Payer: Self-pay | Admitting: *Deleted

## 2017-10-18 ENCOUNTER — Other Ambulatory Visit: Payer: Self-pay

## 2017-10-18 DIAGNOSIS — M5416 Radiculopathy, lumbar region: Secondary | ICD-10-CM

## 2017-10-18 MED ORDER — TRAMADOL HCL 50 MG PO TABS: 50.0000 mg | ORAL_TABLET | Freq: Three times a day (TID) | ORAL | 0 refills | Status: DC | PRN

## 2017-10-18 MED ORDER — PREDNISONE 50 MG PO TABS: ORAL_TABLET | ORAL | 0 refills | Status: DC

## 2017-10-18 NOTE — Telephone Encounter (Signed)
Pt called, he is in a lot of pain, he is scheduled for another epidural on 9.19.19. Referral was faxed to [facility] this morning. Pt is asking if there is anything else he can do between know & then to try & get some relief.  Per Dr. Tamala Julian, okay to send in Prednisone 50mg  1 qd #5 & Tramadol 50mg  1 q8h #15.

## 2017-10-20 ENCOUNTER — Other Ambulatory Visit: Payer: Medicare Other

## 2017-10-26 ENCOUNTER — Ambulatory Visit
Admission: RE | Admit: 2017-10-26 | Discharge: 2017-10-26 | Disposition: A | Discharge status: Home / Self Care | Payer: Medicare Other | Source: Ambulatory Visit | Attending: Family Medicine | Admitting: Family Medicine

## 2017-10-26 DIAGNOSIS — M5416 Radiculopathy, lumbar region: Secondary | ICD-10-CM

## 2017-10-26 MED ORDER — IOPAMIDOL (ISOVUE-M 200) INJECTION 41%
1.0000 mL | Freq: Once | INTRAMUSCULAR | Status: AC
  Administered 2017-10-26: 1 mL via EPIDURAL

## 2017-10-26 MED ORDER — METHYLPREDNISOLONE ACETATE 40 MG/ML INJ SUSP (RADIOLOG
120.0000 mg | Freq: Once | INTRAMUSCULAR | Status: AC
  Administered 2017-10-26: 120 mg via EPIDURAL

## 2017-10-26 NOTE — Discharge Instructions (Signed)

## 2017-10-31 ENCOUNTER — Ambulatory Visit: Payer: Medicare Other | Admitting: Family Medicine

## 2017-10-31 ENCOUNTER — Encounter: Payer: Self-pay | Admitting: Family Medicine

## 2017-10-31 VITALS — BP 146/88 | HR 67 | Ht 69.0 in | Wt 193.0 lb

## 2017-10-31 DIAGNOSIS — M5416 Radiculopathy, lumbar region: Secondary | ICD-10-CM

## 2017-10-31 MED ORDER — TRAMADOL HCL 50 MG PO TABS: 50.0000 mg | ORAL_TABLET | Freq: Three times a day (TID) | ORAL | 0 refills | Status: DC | PRN

## 2017-10-31 NOTE — Assessment & Plan Note (Signed)
Patient symptoms is consistent with the L4 right-sided nerve root impingement.  Has had foot drop previously and still has some mild weakness.  Patient does respond to the epidurals and nerve root injections but unfortunately they are short-lived.  Patient is more than 4 epidurals in the last 12 months.  Failed conservative therapy including physical therapy.  Now needing pain medication.  I believe that patient has exhausted all conservative therapy and surgical intervention is all that is necessary.  Will refer to Dr. Lynann Bologna for further evaluation and treatment.  Patient is in agreement with the plan.  We will follow-up with me again if any questions.  Spent  25 minutes with patient face-to-face and had greater than 50% of counseling including as described above in assessment and plan.

## 2017-10-31 NOTE — Patient Instructions (Addendum)
Good to se eyou  I am sorry you are hurting so much  Start tramadol at least at night with 1 tylenol but also can take in during the day one time in a lot of pain but may make you sleepy  Dr. Phylliss Bob at [facility].  With you going through all the epidural and the weakness you have I am hoping he will move straight to surgery at this time.  [address] [facility]  Please call if you have questions.

## 2017-10-31 NOTE — Progress Notes (Signed)
[name] Keith Keith [name], [name] [address] Phone: [phone] Subjective:   Keith Keith [name], am serving as a scribe for Dr. Hulan Saas.   CC: Back pain follow-up  [id number]  Keith Keith [name] is a 77 y.o. male coming in with complaint of back pain. He said that he hit his right knee 4 weeks ago on a 4x4 timber in his back yard. Pain down the tibia with prolonged walking but Keith [name] pain in the knee joint.   The epidural did improve his pain and he feels like he can stand up when walking. Did try 50 mg of prednisone but that did not improve his pain. Has been taking 2 Tylenol extra strength every 4-6 hours.   Previous work-up for this back shows that patient did have an MRI.  Found to have an L4 nerve root impingement.  Has had multiple epidurals over the course of the last several years.  Has not had for the last 12 months.  Repeat MRI was done on October 17, 2017.  Independently visualized by me showing the patient did have progressive severe bilateral facet arthropathy but also severe right neuroforaminal foraminal stenosis with impingement of the right L4 nerve root.  Rest of the imaging fairly unremarkable for nerve impingement.  And undergone another epidural/nerve root at L4-L5 on the right side October 26, 2017.  Patient states that this 1 did give some benefit.  Approximately 50 to 60% better.  Still little ambulating with the aid of a walker secondary to the discomfort and pain.  Past Medical History:  Diagnosis Date  . Allergic rhinitis   . DDD (degenerative disc disease), cervical   . Hyperlipidemia   . Hypothyroidism    Past Surgical History:  Procedure Laterality Date  . BACK SURGERY  2016  . TONSILLECTOMY     Social History   Socioeconomic History  . Marital status: Married    Spouse name: Not on file  . Number of children: Not on file  . Years of education: Not on file  . Highest education level: Not on file  Occupational  History  . 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 Occupation: Designer, industrial/product bus  Social Needs  . Financial resource strain: Not on file  . Food insecurity:    Worry: Not on file    Inability: Not on file  . Transportation needs:    Medical: Not on file    Non-medical: Not on file  Tobacco Use  . Smoking status: Never Smoker  . Smokeless tobacco: Never Used  Substance and Sexual Activity  . Alcohol use: Keith [name]  . Drug use: Keith [name]  . Sexual activity: Not Currently  Lifestyle  . Physical activity:    Days per week: Not on file    Minutes per session: Not on file  . Stress: Not on file  Relationships  . Social connections:    Talks on phone: Not on file    Gets together: Not on file    Attends religious service: Not on file    Active member of club or organization: Not on file    Attends meetings of clubs or organizations: Not on file    Relationship status: Not on file  Other Topics Concern  . Not on file  Social History Narrative   Regular Exercise -  YES         Allergies  Allergen Reactions  . Atorvastatin Other (See Comments)  leg cramps  . Rosuvastatin Other (See Comments)    myalgias   Family History  Problem Relation Age of Onset  . Coronary artery disease Father        Fatal MI  . Coronary artery disease Other   . Breast cancer Other   . Diabetes Father   . Diabetes Mother     Current Outpatient Medications (Endocrine & Metabolic):  .  levothyroxine (SYNTHROID) 137 MCG tablet, Take 1 tablet (137 mcg total) by mouth daily before breakfast. .  predniSONE (DELTASONE) 5 MG tablet, Take 6 pills for first day, 5 pills second day, 4 pills third day, 3 pills fourth day, 2 pills the fifth day, and 1 pill sixth day. .  predniSONE (DELTASONE) 50 MG tablet, Take 1 tablet daily.   Current Outpatient Medications (Respiratory):  .  fluticasone (FLONASE) 50 MCG/ACT nasal spray, instill 2 sprays into each nostril once daily  Current Outpatient  Medications (Analgesics):  .  aspirin 81 MG EC tablet, Take 81 mg by mouth daily.   .  traMADol (ULTRAM) 50 MG tablet, Take 1 tablet (50 mg total) by mouth every 8 (eight) hours as needed.   Current Outpatient Medications (Other):  .  fish oil-omega-3 fatty acids 1000 MG capsule, Take 1 g by mouth daily.   Marland Kitchen  glucosamine-chondroitin 500-400 MG tablet, Take 1 tablet by mouth 2 (two) times daily. .  magnesium 30 MG tablet, Take 60 mg by mouth daily. .  Red Yeast Rice 600 MG TABS,      Past medical history, social, surgical and family history all reviewed in electronic medical record.  Keith [name] pertanent information unless stated regarding to the chief complaint.   Review of Systems:  Keith [name] headache, visual changes, nausea, vomiting, diarrhea, constipation, dizziness, abdominal pain, skin rash, fevers, chills, night sweats, weight loss, swollen lymph nodes, , chest pain, shortness of breath, mood changes.  Positive muscle aches, body aches, weakness in the lower extremities  Objective  Blood pressure (!) 146/88, pulse 67, height 5\' 9"  (1.753 m), weight 193 lb (87.5 kg), SpO2 98 %.    General: Mild distress alert and oriented x3 mood and affect normal, dressed appropriately.  HEENT: Pupils equal, extraocular movements intact  Respiratory: Patient's speak in full sentences and does not appear short of breath  Cardiovascular: Keith [name] lower extremity edema, non tender, Keith [name] erythema  Skin: Warm dry intact with Keith [name] signs of infection or rash on extremities or on axial skeleton.  Abdomen: Soft nontender  Neuro: Cranial nerves II through XII are intact, neurovascularly intact in all extremities with and 2+ pulses.  Lymph: Keith [name] lymphadenopathy of posterior or anterior cervical chain or axillae bilaterally.  Gait antalgic walking with the aid of a walker MSK: Mild tender with full range of motion and good stability and symmetric strength and tone of shoulders, elbows, wrist, hip, knee and ankles bilaterally.   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  Deep tendon reflex of the Achilles is 1+ compared to the contralateral side.  Back exam shows tenderness to palpation of the paraspinal musculature lumbar spine right greater than left.    Impression and Recommendations:     This case required medical decision making of moderate complexity. The above documentation has been reviewed and is accurate and complete Lyndal Pulley, DO  Note: This dictation was prepared with Dragon dictation along with smaller phrase technology. Any transcriptional errors that result from this process are unintentional.

## 2017-11-01 ENCOUNTER — Other Ambulatory Visit: Payer: Medicare Other

## 2017-11-06 ENCOUNTER — Telehealth: Payer: Self-pay | Admitting: *Deleted

## 2017-11-06 MED ORDER — TRAMADOL HCL 50 MG PO TABS: 50.0000 mg | ORAL_TABLET | Freq: Three times a day (TID) | ORAL | 0 refills | Status: DC | PRN

## 2017-11-06 NOTE — Telephone Encounter (Signed)
Will refill one time

## 2017-11-06 NOTE — Telephone Encounter (Signed)
Pt called requesting a refill on Tramadol. Pt has an appt this Friday 11/10/17 with Dr. Lynann Bologna.

## 2017-11-21 ENCOUNTER — Other Ambulatory Visit: Payer: Self-pay | Admitting: Internal Medicine

## 2017-11-21 DIAGNOSIS — E039 Hypothyroidism, unspecified: Secondary | ICD-10-CM

## 2017-11-21 MED ORDER — LEVOTHYROXINE SODIUM 137 MCG PO TABS: 137.0000 ug | ORAL_TABLET | Freq: Every day | ORAL | 0 refills | Status: DC

## 2017-11-21 NOTE — Telephone Encounter (Signed)
Copied from [place] [id number]. Topic: Quick Communication - Rx Refill/Question >> Nov 21, 2017  4:17 PM Margot Ables wrote: Medication: levothyroxine (SYNTHROID) 137 MCG tablet - pt has about 2 weeks on hand but is scheduled for back surgery 11/29/17. He will not be able to drive for 2-4 weeks. Pt is scheduled for 6 mo f/u 01/01/18 with Dr. Ronnald Ramp and asking for refill until that time.   Has the patient contacted their pharmacy? Yes - no refills Preferred Pharmacy (with phone number or street name): [id number] [address], [place] [phone] (Phone) [phone] (Fax)

## 2017-11-23 ENCOUNTER — Other Ambulatory Visit: Payer: Self-pay | Admitting: Orthopedic Surgery

## 2017-11-24 NOTE — Pre-Procedure Instructions (Signed)
Keith [name]  11/24/2017      [id number] [address] [place], [place] [address] AT [address] [place] [address] Phone: [phone] Fax: [phone]    Your procedure is scheduled on Wed. Oct. 23, 2019 from 7:30am-12:00pm  Report to [facility] Admitting Entrance "A" at 5:30am  Call this number if you have problems the morning of surgery:  [phone]   Remember:  Do not eat or drink after midnight on Oct. 22nd    Take these medicines the morning of surgery with A SIP OF WATER: Levothyroxine (SYNTHROID)  If needed: TraMADol (ULTRAM) and Fluticasone (FLONASE)    As of today, stop taking all Other Aspirin Products, Vitamins, Fish oils, and Herbal medications. Also stop all NSAIDS i.e. Advil, Ibuprofen, Motrin, Aleve, Anaprox, Naproxen, BC, Goody Powders, and all Supplements.   Do not wear jewelry.  Do not wear lotions, powders, colognes, or deodorant.  Do not shave 48 hours prior to surgery.  Men may shave face.  Do not bring valuables to the hospital.  [facility] is not responsible for any belongings or valuables.  Contacts, dentures or bridgework may not be worn into surgery.  Leave your suitcase in the car.  After surgery it may be brought to your room.  For patients admitted to the hospital, discharge time will be determined by your treatment team.  Patients discharged the day of surgery will not be allowed to drive home.   Special instructions:   [place]- Preparing For Surgery  Before surgery, you can play an important role. Because skin is not sterile, your skin needs to be as free of germs as possible. You can reduce the number of germs on your skin by washing with CHG (chlorahexidine gluconate) Soap before surgery.  CHG is an antiseptic cleaner which kills germs and bonds with the skin to continue killing germs even after washing.    Oral Hygiene is also important to reduce  your risk of infection.  Remember - BRUSH YOUR TEETH THE MORNING OF SURGERY WITH YOUR REGULAR TOOTHPASTE  Please do not use if you have an allergy to CHG or antibacterial soaps. If your skin becomes reddened/irritated stop using the CHG.  Do not shave (including legs and underarms) for at least 48 hours prior to first CHG shower. It is OK to shave your face.  Please follow these instructions carefully.   1. Shower the NIGHT BEFORE SURGERY and the MORNING OF SURGERY with CHG.   2. If you chose to wash your hair, wash your hair first as usual with your normal shampoo.  3. After you shampoo, rinse your hair and body thoroughly to remove the shampoo.  4. Use CHG as you would any other liquid soap. You can apply CHG directly to the skin and wash gently with a scrungie or a clean washcloth.   5. Apply the CHG Soap to your body ONLY FROM THE NECK DOWN.  Do not use on open wounds or open sores. Avoid contact with your eyes, ears, mouth and genitals (private parts). Wash Face and genitals (private parts)  with your normal soap.  6. Wash thoroughly, paying special attention to the area where your surgery will be performed.  7. Thoroughly rinse your body with warm water from the neck down.  8. DO NOT shower/wash with your normal soap after using and rinsing off the CHG Soap.  9. Pat yourself dry with a CLEAN  TOWEL.  10. Wear CLEAN PAJAMAS to bed the night before surgery, wear comfortable clothes the morning of surgery  11. Place CLEAN SHEETS on your bed the night of your first shower and DO NOT SLEEP WITH PETS.  Day of Surgery:  Do not apply any deodorants/lotions.  Please wear clean clothes to the hospital/surgery center.   Remember to brush your teeth WITH YOUR REGULAR TOOTHPASTE.  Please read over the following fact sheets that you were given. Pain Booklet, Coughing and Deep Breathing, MRSA Information and Surgical Site Infection Prevention

## 2017-11-27 ENCOUNTER — Encounter (HOSPITAL_COMMUNITY): Payer: Self-pay

## 2017-11-27 ENCOUNTER — Other Ambulatory Visit: Payer: Self-pay

## 2017-11-27 ENCOUNTER — Encounter (HOSPITAL_COMMUNITY)
Admission: RE | Admit: 2017-11-27 | Discharge: 2017-11-27 | Disposition: A | Discharge status: Home / Self Care | Payer: Medicare Other | Source: Ambulatory Visit | Attending: Orthopedic Surgery | Admitting: Orthopedic Surgery

## 2017-11-27 DIAGNOSIS — Z01812 Encounter for preprocedural laboratory examination: Secondary | ICD-10-CM

## 2017-11-27 HISTORY — DX: Nausea with vomiting, unspecified: R11.2

## 2017-11-27 HISTORY — DX: Other specified postprocedural states: Z98.890

## 2017-11-27 HISTORY — DX: Malignant (primary) neoplasm, unspecified: C80.1

## 2017-11-27 LAB — CBC WITH DIFFERENTIAL/PLATELET
Abs Immature Granulocytes: 0.09 10*3/uL — ABNORMAL HIGH (ref 0.00–0.07)
BASOS PCT: 1 %
Basophils Absolute: 0.1 10*3/uL (ref 0.0–0.1)
EOS PCT: 0 %
Eosinophils Absolute: 0 10*3/uL (ref 0.0–0.5)
HCT: 52.5 % — ABNORMAL HIGH (ref 39.0–52.0)
Hemoglobin: 17.1 g/dL — ABNORMAL HIGH (ref 13.0–17.0)
Immature Granulocytes: 1 %
Lymphocytes Relative: 16 %
Lymphs Abs: 1.7 10*3/uL (ref 0.7–4.0)
MCH: 30.5 pg (ref 26.0–34.0)
MCHC: 32.6 g/dL (ref 30.0–36.0)
MCV: 93.8 fL (ref 80.0–100.0)
MONO ABS: 0.8 10*3/uL (ref 0.1–1.0)
Monocytes Relative: 8 %
NEUTROS ABS: 7.8 10*3/uL — AB (ref 1.7–7.7)
Neutrophils Relative %: 74 %
PLATELETS: 215 10*3/uL (ref 150–400)
RBC: 5.6 MIL/uL (ref 4.22–5.81)
RDW: 13.5 % (ref 11.5–15.5)
WBC: 10.5 10*3/uL (ref 4.0–10.5)
nRBC: 0 % (ref 0.0–0.2)

## 2017-11-27 LAB — URINALYSIS, ROUTINE W REFLEX MICROSCOPIC
Bacteria, UA: NONE SEEN
Bilirubin Urine: NEGATIVE
GLUCOSE, UA: NEGATIVE mg/dL
Ketones, ur: NEGATIVE mg/dL
Leukocytes, UA: NEGATIVE
Nitrite: NEGATIVE
PROTEIN: NEGATIVE mg/dL
SPECIFIC GRAVITY, URINE: 1.011 (ref 1.005–1.030)
pH: 5 (ref 5.0–8.0)

## 2017-11-27 LAB — TYPE AND SCREEN
ABO/RH(D): O POS
Antibody Screen: NEGATIVE

## 2017-11-27 LAB — COMPREHENSIVE METABOLIC PANEL
ALT: 24 U/L (ref 0–44)
ANION GAP: 9 (ref 5–15)
AST: 25 U/L (ref 15–41)
Albumin: 3.6 g/dL (ref 3.5–5.0)
Alkaline Phosphatase: 43 U/L (ref 38–126)
BUN: 9 mg/dL (ref 8–23)
CHLORIDE: 103 mmol/L (ref 98–111)
CO2: 26 mmol/L (ref 22–32)
Calcium: 8.9 mg/dL (ref 8.9–10.3)
Creatinine, Ser: 1.04 mg/dL (ref 0.61–1.24)
GFR calc non Af Amer: >60 mL/min (ref >60)
Glucose, Bld: 133 mg/dL — ABNORMAL HIGH (ref 70–99)
Potassium: 3.8 mmol/L (ref 3.5–5.1)
SODIUM: 138 mmol/L (ref 135–145)
Total Bilirubin: 0.8 mg/dL (ref 0.3–1.2)
Total Protein: 6.7 g/dL (ref 6.5–8.1)

## 2017-11-27 LAB — PROTIME-INR
INR: 1.01
Prothrombin Time: 13.2 seconds (ref 11.4–15.2)

## 2017-11-27 LAB — APTT: APTT: 28 s (ref 24–36)

## 2017-11-27 LAB — SURGICAL PCR SCREEN
MRSA, PCR: NEGATIVE
STAPHYLOCOCCUS AUREUS: NEGATIVE

## 2017-11-27 LAB — ABO/RH: ABO/RH(D): O POS

## 2017-11-27 NOTE — Progress Notes (Signed)
PCP is Dr. Scarlette Calico  LOV 05/2017  He also manages his thyroid disease/meds. Denies cp, murmur, sob.   No cardiac testing done.

## 2017-11-27 NOTE — Pre-Procedure Instructions (Signed)
Keith [name]  11/27/2017      [id number] [address], [place] - [address] [place] [address] Phone: [phone] Fax: [phone]    Your procedure is scheduled on Wed. Oct. 23, 2019   Report to [facility] Admitting Entrance "A" at 5:30am             (posted surgery time 7:30a - 12n)   Call this number if you have problems the morning of surgery:  [phone]   Remember:    Do not eat or drink after midnight on Oct. 22nd    Take these medicines the morning of surgery with A SIP OF WATER: Levothyroxine (SYNTHROID)  If needed: TraMADol (ULTRAM) and Fluticasone (FLONASE)    As of today, stop taking all Other Aspirin Products, Vitamins, Fish oils, and Herbal medications. Also stop all NSAIDS i.e. Advil, Ibuprofen, Motrin, Aleve, Anaprox, Naproxen, BC, Goody Powders, and all Supplements.   Do not wear jewelry.  Do not wear lotions, colognes, or deodorant.             Men may shave face.  Do not bring valuables to the hospital.  [facility] is not responsible for any belongings or valuables.  Contacts, dentures or bridgework may not be worn into surgery.  Leave your suitcase in the car.  After surgery it may be brought to your room.  For patients admitted to the hospital, discharge time will be determined by your treatment team.      Special instructions:   [place]- Preparing For Surgery  Before surgery, you can play an important role. Because skin is not sterile, your skin needs to be as free of germs as possible. You can reduce the number of germs on your skin by washing with CHG (chlorahexidine gluconate) Soap before surgery.  CHG is an antiseptic cleaner which kills germs and bonds with the skin to continue killing germs even after washing.    Oral Hygiene is also important to reduce your risk of infection.    Remember - BRUSH YOUR TEETH THE MORNING OF  SURGERY WITH YOUR REGULAR TOOTHPASTE  Please do not use if you have an allergy to CHG or antibacterial soaps. If your skin becomes reddened/irritated stop using the CHG.  Do not shave (including legs and underarms) for at least 48 hours prior to first CHG shower. It is OK to shave your face.  Please follow these instructions carefully.   1. Shower the NIGHT BEFORE SURGERY and the MORNING OF SURGERY with CHG.   2. If you chose to wash your hair, wash your hair first as usual with your normal shampoo.  3. After you shampoo, rinse your hair and body thoroughly to remove the shampoo.  4. Use CHG as you would any other liquid soap. You can apply CHG directly to the skin and wash gently with a scrungie or a clean washcloth.   5. Apply the CHG Soap to your body ONLY FROM THE NECK DOWN.  Do not use on open wounds or open sores. Avoid contact with your eyes, ears, mouth and genitals (private parts). Wash Face and genitals (private parts)  with your normal soap.  6. Wash thoroughly, paying special attention to the area where your surgery will be performed.  7. Thoroughly rinse your body with warm water from the neck down.  8. DO NOT shower/wash with your normal soap after using and rinsing  off the CHG Soap.  9. Pat yourself dry with a CLEAN TOWEL.  10. Wear CLEAN PAJAMAS to bed the night before surgery, wear comfortable clothes the morning of surgery  11. Place CLEAN SHEETS on your bed the night of your first shower and DO NOT SLEEP WITH PETS.  Day of Surgery:  Do not apply any deodorants/lotions.  Please wear clean clothes to the hospital/surgery center.    Remember to brush your teeth WITH YOUR REGULAR TOOTHPASTE.  Please read over the following fact sheets that you were given. Pain Booklet, MRSA Information and Surgical Site Infection Prevention

## 2017-11-28 NOTE — Anesthesia Preprocedure Evaluation (Addendum)
Anesthesia Evaluation  Patient identified by MRN, date of birth, ID band Patient awake    Reviewed: Allergy & Precautions, NPO status , Patient's Chart, lab work & pertinent test results  History of Anesthesia Complications (+) PONV and history of anesthetic complications  Airway Mallampati: I  TM Distance: >3 FB Neck ROM: Full    Dental no notable dental hx. (+) Teeth Intact, Dental Advisory Given   Pulmonary neg pulmonary ROS,    Pulmonary exam normal breath sounds clear to auscultation       Cardiovascular negative cardio ROS Normal cardiovascular exam Rhythm:Regular Rate:Normal     Neuro/Psych negative neurological ROS  negative psych ROS   GI/Hepatic negative GI ROS, Neg liver ROS,   Endo/Other  Hypothyroidism   Renal/GU negative Renal ROS  negative genitourinary   Musculoskeletal  (+) Arthritis , Osteoarthritis,    Abdominal   Peds  Hematology negative hematology ROS (+)   Anesthesia Other Findings Lumbar radiculopathy, R leg paresthesia  Reproductive/Obstetrics                            Anesthesia Physical Anesthesia Plan  ASA: II  Anesthesia Plan: General   Post-op Pain Management:    Induction: Intravenous  PONV Risk Score and Plan: 3 and Dexamethasone, Ondansetron, Treatment may vary due to age or medical condition and Propofol infusion  Airway Management Planned: Oral ETT  Additional Equipment:   Intra-op Plan:   Post-operative Plan: Extubation in OR  Informed Consent: I have reviewed the patients History and Physical, chart, labs and discussed the procedure including the risks, benefits and alternatives for the proposed anesthesia with the patient or authorized representative who has indicated his/her understanding and acceptance.   Dental advisory given  Plan Discussed with: CRNA  Anesthesia Plan Comments:        Anesthesia Quick Evaluation

## 2017-11-29 ENCOUNTER — Inpatient Hospital Stay (HOSPITAL_COMMUNITY)
Admission: RE | Disposition: A | Discharge status: Home / Self Care | Payer: Self-pay | Source: Ambulatory Visit | Attending: Orthopedic Surgery

## 2017-11-29 ENCOUNTER — Inpatient Hospital Stay (HOSPITAL_COMMUNITY)
Admission: RE | Admit: 2017-11-29 | Discharge: 2017-11-30 | DRG: 455 | Disposition: A | Discharge status: Home / Self Care | Payer: Medicare Other | Source: Ambulatory Visit | Attending: Orthopedic Surgery | Admitting: Orthopedic Surgery

## 2017-11-29 ENCOUNTER — Other Ambulatory Visit: Payer: Self-pay

## 2017-11-29 ENCOUNTER — Inpatient Hospital Stay (HOSPITAL_COMMUNITY): Payer: Medicare Other | Admitting: Anesthesiology

## 2017-11-29 ENCOUNTER — Encounter (HOSPITAL_COMMUNITY): Payer: Self-pay | Admitting: Surgery

## 2017-11-29 ENCOUNTER — Inpatient Hospital Stay (HOSPITAL_COMMUNITY): Payer: Medicare Other

## 2017-11-29 ENCOUNTER — Inpatient Hospital Stay (HOSPITAL_COMMUNITY): Payer: Medicare Other | Admitting: Physician Assistant

## 2017-11-29 DIAGNOSIS — M5416 Radiculopathy, lumbar region: Principal | ICD-10-CM | POA: Diagnosis present

## 2017-11-29 DIAGNOSIS — Z89021 Acquired absence of right finger(s): Secondary | ICD-10-CM | POA: Diagnosis not present

## 2017-11-29 DIAGNOSIS — E785 Hyperlipidemia, unspecified: Secondary | ICD-10-CM | POA: Diagnosis present

## 2017-11-29 DIAGNOSIS — Z888 Allergy status to other drugs, medicaments and biological substances status: Secondary | ICD-10-CM

## 2017-11-29 DIAGNOSIS — Z85828 Personal history of other malignant neoplasm of skin: Secondary | ICD-10-CM | POA: Diagnosis not present

## 2017-11-29 DIAGNOSIS — Z419 Encounter for procedure for purposes other than remedying health state, unspecified: Secondary | ICD-10-CM

## 2017-11-29 DIAGNOSIS — Z9089 Acquired absence of other organs: Secondary | ICD-10-CM

## 2017-11-29 DIAGNOSIS — M5410 Radiculopathy, site unspecified: Secondary | ICD-10-CM | POA: Diagnosis present

## 2017-11-29 DIAGNOSIS — E039 Hypothyroidism, unspecified: Secondary | ICD-10-CM | POA: Diagnosis present

## 2017-11-29 DIAGNOSIS — Z79890 Hormone replacement therapy (postmenopausal): Secondary | ICD-10-CM

## 2017-11-29 DIAGNOSIS — M4316 Spondylolisthesis, lumbar region: Secondary | ICD-10-CM | POA: Diagnosis present

## 2017-11-29 DIAGNOSIS — M48061 Spinal stenosis, lumbar region without neurogenic claudication: Secondary | ICD-10-CM | POA: Diagnosis present

## 2017-11-29 HISTORY — PX: TRANSFORAMINAL LUMBAR INTERBODY FUSION (TLIF) WITH PEDICLE SCREW FIXATION 1 LEVEL: SHX6141

## 2017-11-29 SURGERY — TRANSFORAMINAL LUMBAR INTERBODY FUSION (TLIF) WITH PEDICLE SCREW FIXATION 1 LEVEL
Anesthesia: General | Site: Back | Laterality: Right

## 2017-11-29 MED ORDER — LACTATED RINGERS IV SOLN
INTRAVENOUS | Status: DC | PRN
  Administered 2017-11-29 (×3): via INTRAVENOUS

## 2017-11-29 MED ORDER — SENNOSIDES-DOCUSATE SODIUM 8.6-50 MG PO TABS: 1.0000 | ORAL_TABLET | Freq: Every evening | ORAL | Status: DC | PRN

## 2017-11-29 MED ORDER — DEXAMETHASONE SODIUM PHOSPHATE 10 MG/ML IJ SOLN
INTRAMUSCULAR | Status: DC | PRN
  Administered 2017-11-29: 10 mg via INTRAVENOUS

## 2017-11-29 MED ORDER — PROPOFOL 10 MG/ML IV BOLUS
INTRAVENOUS | Status: DC | PRN
  Administered 2017-11-29: 150 mg via INTRAVENOUS

## 2017-11-29 MED ORDER — HYDROMORPHONE HCL 1 MG/ML IJ SOLN: 0.2500 mg | INTRAMUSCULAR | Status: DC | PRN

## 2017-11-29 MED ORDER — ONDANSETRON HCL 4 MG/2ML IJ SOLN
INTRAMUSCULAR | Status: DC | PRN
  Administered 2017-11-29: 4 mg via INTRAVENOUS

## 2017-11-29 MED ORDER — PHENYLEPHRINE HCL 10 MG/ML IJ SOLN
INTRAMUSCULAR | Status: DC | PRN
  Administered 2017-11-29 (×3): 80 ug via INTRAVENOUS
  Administered 2017-11-29: 40 ug via INTRAVENOUS
  Administered 2017-11-29: 120 ug via INTRAVENOUS

## 2017-11-29 MED ORDER — PROPOFOL 500 MG/50ML IV EMUL
INTRAVENOUS | Status: DC | PRN
  Administered 2017-11-29 (×2): 75 ug/kg/min via INTRAVENOUS

## 2017-11-29 MED ORDER — PANTOPRAZOLE SODIUM 20 MG PO TBEC
20.0000 mg | DELAYED_RELEASE_TABLET | Freq: Two times a day (BID) | ORAL | Status: DC
  Administered 2017-11-29 – 2017-11-30 (×2): 20 mg via ORAL
  Filled 2017-11-29 (×2): qty 1

## 2017-11-29 MED ORDER — METHYLENE BLUE 0.5 % INJ SOLN
INTRAVENOUS | Status: DC | PRN
  Administered 2017-11-29: 1 mL via INTRADERMAL

## 2017-11-29 MED ORDER — BISACODYL 5 MG PO TBEC: 5.0000 mg | DELAYED_RELEASE_TABLET | Freq: Every day | ORAL | Status: DC | PRN

## 2017-11-29 MED ORDER — FENTANYL CITRATE (PF) 250 MCG/5ML IJ SOLN
INTRAMUSCULAR | Status: AC
  Filled 2017-11-29: qty 5

## 2017-11-29 MED ORDER — DOCUSATE SODIUM 100 MG PO CAPS
100.0000 mg | ORAL_CAPSULE | Freq: Two times a day (BID) | ORAL | Status: DC
  Administered 2017-11-29 – 2017-11-30 (×3): 100 mg via ORAL
  Filled 2017-11-29 (×3): qty 1

## 2017-11-29 MED ORDER — CEFAZOLIN SODIUM-DEXTROSE 2-4 GM/100ML-% IV SOLN
2.0000 g | Freq: Three times a day (TID) | INTRAVENOUS | Status: AC
  Administered 2017-11-29 (×2): 2 g via INTRAVENOUS
  Filled 2017-11-29 (×2): qty 100

## 2017-11-29 MED ORDER — THROMBIN 20000 UNITS EX SOLR
CUTANEOUS | Status: DC | PRN
  Administered 2017-11-29: 10 mL via TOPICAL

## 2017-11-29 MED ORDER — LEVOTHYROXINE SODIUM 137 MCG PO TABS
137.0000 ug | ORAL_TABLET | Freq: Every day | ORAL | Status: DC
  Administered 2017-11-30: 137 ug via ORAL
  Filled 2017-11-29: qty 1

## 2017-11-29 MED ORDER — ZOLPIDEM TARTRATE 5 MG PO TABS: 5.0000 mg | ORAL_TABLET | Freq: Every evening | ORAL | Status: DC | PRN

## 2017-11-29 MED ORDER — PHENOL 1.4 % MT LIQD: 1.0000 | OROMUCOSAL | Status: DC | PRN

## 2017-11-29 MED ORDER — THROMBIN (RECOMBINANT) 20000 UNITS EX SOLR
CUTANEOUS | Status: AC
  Filled 2017-11-29: qty 20000

## 2017-11-29 MED ORDER — SODIUM CHLORIDE 0.9 % IV SOLN
INTRAVENOUS | Status: DC | PRN
  Administered 2017-11-29: 40 ug/min via INTRAVENOUS

## 2017-11-29 MED ORDER — 0.9 % SODIUM CHLORIDE (POUR BTL) OPTIME
TOPICAL | Status: DC | PRN
  Administered 2017-11-29 (×2): 1000 mL

## 2017-11-29 MED ORDER — MIDAZOLAM HCL 2 MG/2ML IJ SOLN
INTRAMUSCULAR | Status: AC
  Filled 2017-11-29: qty 2

## 2017-11-29 MED ORDER — SODIUM CHLORIDE 0.9% FLUSH: 3.0000 mL | Freq: Two times a day (BID) | INTRAVENOUS | Status: DC

## 2017-11-29 MED ORDER — POTASSIUM CHLORIDE IN NACL 20-0.9 MEQ/L-% IV SOLN: INTRAVENOUS | Status: DC

## 2017-11-29 MED ORDER — ALUM & MAG HYDROXIDE-SIMETH 200-200-20 MG/5ML PO SUSP: 30.0000 mL | Freq: Four times a day (QID) | ORAL | Status: DC | PRN

## 2017-11-29 MED ORDER — ACETAMINOPHEN 650 MG RE SUPP: 650.0000 mg | RECTAL | Status: DC | PRN

## 2017-11-29 MED ORDER — POVIDONE-IODINE 7.5 % EX SOLN
Freq: Once | CUTANEOUS | Status: DC
  Filled 2017-11-29: qty 118

## 2017-11-29 MED ORDER — CALCIUM CARBONATE-VITAMIN D 500-200 MG-UNIT PO TABS
1.0000 | ORAL_TABLET | Freq: Every day | ORAL | Status: DC
  Administered 2017-11-30: 1 via ORAL
  Filled 2017-11-29: qty 1

## 2017-11-29 MED ORDER — CALCIUM CARB-CHOLECALCIFEROL 600-800 MG-UNIT PO TABS: ORAL_TABLET | Freq: Every day | ORAL | Status: DC

## 2017-11-29 MED ORDER — SODIUM CHLORIDE 0.9% FLUSH: 3.0000 mL | INTRAVENOUS | Status: DC | PRN

## 2017-11-29 MED ORDER — BUPIVACAINE-EPINEPHRINE 0.25% -1:200000 IJ SOLN
INTRAMUSCULAR | Status: DC | PRN
  Administered 2017-11-29: 10 mL
  Administered 2017-11-29: 20 mL

## 2017-11-29 MED ORDER — LIDOCAINE HCL (CARDIAC) PF 100 MG/5ML IV SOSY
PREFILLED_SYRINGE | INTRAVENOUS | Status: DC | PRN
  Administered 2017-11-29: 100 mg via INTRAVENOUS

## 2017-11-29 MED ORDER — ONDANSETRON HCL 4 MG PO TABS: 4.0000 mg | ORAL_TABLET | Freq: Four times a day (QID) | ORAL | Status: DC | PRN

## 2017-11-29 MED ORDER — MAGNESIUM 250 MG PO TABS: 250.0000 mg | ORAL_TABLET | Freq: Every day | ORAL | Status: DC

## 2017-11-29 MED ORDER — FLUTICASONE PROPIONATE 50 MCG/ACT NA SUSP
2.0000 | Freq: Every day | NASAL | Status: DC | PRN
  Filled 2017-11-29: qty 16

## 2017-11-29 MED ORDER — OXYCODONE-ACETAMINOPHEN 5-325 MG PO TABS
1.0000 | ORAL_TABLET | ORAL | Status: DC | PRN
  Administered 2017-11-29 – 2017-11-30 (×3): 2 via ORAL
  Filled 2017-11-29 (×5): qty 2

## 2017-11-29 MED ORDER — FENTANYL CITRATE (PF) 100 MCG/2ML IJ SOLN
INTRAMUSCULAR | Status: DC | PRN
  Administered 2017-11-29 (×2): 50 ug via INTRAVENOUS
  Administered 2017-11-29: 100 ug via INTRAVENOUS
  Administered 2017-11-29: 50 ug via INTRAVENOUS

## 2017-11-29 MED ORDER — BUPIVACAINE LIPOSOME 1.3 % IJ SUSP
20.0000 mL | INTRAMUSCULAR | Status: AC
  Administered 2017-11-29: 20 mL
  Filled 2017-11-29: qty 20

## 2017-11-29 MED ORDER — SUGAMMADEX SODIUM 200 MG/2ML IV SOLN
INTRAVENOUS | Status: DC | PRN
  Administered 2017-11-29: 172.4 mg via INTRAVENOUS

## 2017-11-29 MED ORDER — MAGNESIUM OXIDE 400 (241.3 MG) MG PO TABS
200.0000 mg | ORAL_TABLET | Freq: Every day | ORAL | Status: DC
  Administered 2017-11-30: 200 mg via ORAL
  Filled 2017-11-29: qty 1

## 2017-11-29 MED ORDER — MENTHOL 3 MG MT LOZG: 1.0000 | LOZENGE | OROMUCOSAL | Status: DC | PRN

## 2017-11-29 MED ORDER — MORPHINE SULFATE (PF) 2 MG/ML IV SOLN: 1.0000 mg | INTRAVENOUS | Status: DC | PRN

## 2017-11-29 MED ORDER — FLEET ENEMA 7-19 GM/118ML RE ENEM: 1.0000 | ENEMA | Freq: Once | RECTAL | Status: DC | PRN

## 2017-11-29 MED ORDER — PROPOFOL 10 MG/ML IV BOLUS
INTRAVENOUS | Status: AC
  Filled 2017-11-29: qty 20

## 2017-11-29 MED ORDER — ROCURONIUM BROMIDE 100 MG/10ML IV SOLN
INTRAVENOUS | Status: DC | PRN
  Administered 2017-11-29: 20 mg via INTRAVENOUS
  Administered 2017-11-29: 50 mg via INTRAVENOUS

## 2017-11-29 MED ORDER — FERROUS SULFATE 325 (65 FE) MG PO TABS
325.0000 mg | ORAL_TABLET | Freq: Every day | ORAL | Status: DC
  Administered 2017-11-30: 325 mg via ORAL
  Filled 2017-11-29: qty 1

## 2017-11-29 MED ORDER — ONDANSETRON HCL 4 MG/2ML IJ SOLN: 4.0000 mg | Freq: Four times a day (QID) | INTRAMUSCULAR | Status: DC | PRN

## 2017-11-29 MED ORDER — CEFAZOLIN SODIUM-DEXTROSE 2-4 GM/100ML-% IV SOLN
2.0000 g | INTRAVENOUS | Status: AC
  Administered 2017-11-29: 2 g via INTRAVENOUS
  Filled 2017-11-29: qty 100

## 2017-11-29 MED ORDER — SODIUM CHLORIDE 0.9 % IV SOLN: 250.0000 mL | INTRAVENOUS | Status: DC

## 2017-11-29 MED ORDER — BUPIVACAINE-EPINEPHRINE (PF) 0.25% -1:200000 IJ SOLN
INTRAMUSCULAR | Status: AC
  Filled 2017-11-29: qty 30

## 2017-11-29 MED ORDER — METHYLENE BLUE 0.5 % INJ SOLN
INTRAVENOUS | Status: AC
  Filled 2017-11-29: qty 10

## 2017-11-29 MED ORDER — ACETAMINOPHEN 325 MG PO TABS: 650.0000 mg | ORAL_TABLET | ORAL | Status: DC | PRN

## 2017-11-29 MED ORDER — DIAZEPAM 5 MG PO TABS
5.0000 mg | ORAL_TABLET | Freq: Four times a day (QID) | ORAL | Status: DC | PRN
  Administered 2017-11-29 – 2017-11-30 (×2): 5 mg via ORAL
  Filled 2017-11-29 (×3): qty 1

## 2017-11-29 SURGICAL SUPPLY — 92 items
APL SKNCLS STERI-STRIP NONHPOA (GAUZE/BANDAGES/DRESSINGS) ×1
BENZOIN TINCTURE PRP APPL 2/3 (GAUZE/BANDAGES/DRESSINGS) ×3 IMPLANT
BLADE CLIPPER SURG (BLADE) ×2 IMPLANT
BONE VIVIGEN FORMABLE 10CC (Bone Implant) ×3 IMPLANT
BUR PRESCISION 1.7 ELITE (BURR) ×1 IMPLANT
BUR ROUND FLUTED 5 RND (BURR) ×2 IMPLANT
BUR ROUND FLUTED 5MM RND (BURR) ×1
BUR ROUND PRECISION 4.0 (BURR) IMPLANT
BUR ROUND PRECISION 4.0MM (BURR)
BUR SABER RD CUTTING 3.0 (BURR) IMPLANT
BUR SABER RD CUTTING 3.0MM (BURR)
CAGE CONCORDE BULLET 9X10X27 (Cage) ×2 IMPLANT
CARTRIDGE OIL MAESTRO DRILL (MISCELLANEOUS) ×1 IMPLANT
CLOSURE STERI-STRIP 1/2X4 (GAUZE/BANDAGES/DRESSINGS) ×1
CLOSURE WOUND 1/2 X4 (GAUZE/BANDAGES/DRESSINGS)
CLSR STERI-STRIP ANTIMIC 1/2X4 (GAUZE/BANDAGES/DRESSINGS) ×2 IMPLANT
CONT SPEC 4OZ CLIKSEAL STRL BL (MISCELLANEOUS) ×3 IMPLANT
COVER BACK TABLE 80X110 HD (DRAPES) ×2 IMPLANT
COVER MAYO STAND STRL (DRAPES) ×4 IMPLANT
COVER SURGICAL LIGHT HANDLE (MISCELLANEOUS) ×3 IMPLANT
COVER WAND RF STERILE (DRAPES) ×3 IMPLANT
DIFFUSER DRILL AIR PNEUMATIC (MISCELLANEOUS) ×3 IMPLANT
DRAIN CHANNEL 15F RND FF W/TCR (WOUND CARE) IMPLANT
DRAPE C-ARM 42X72 X-RAY (DRAPES) ×3 IMPLANT
DRAPE C-ARMOR (DRAPES) IMPLANT
DRAPE POUCH INSTRU U-SHP 10X18 (DRAPES) ×3 IMPLANT
DRAPE SURG 17X23 STRL (DRAPES) ×12 IMPLANT
DURAPREP 26ML APPLICATOR (WOUND CARE) ×3 IMPLANT
ELECT BLADE 4.0 EZ CLEAN MEGAD (MISCELLANEOUS) ×3
ELECT CAUTERY BLADE 6.4 (BLADE) ×3 IMPLANT
ELECT REM PT RETURN 9FT ADLT (ELECTROSURGICAL) ×3
ELECTRODE BLDE 4.0 EZ CLN MEGD (MISCELLANEOUS) ×1 IMPLANT
ELECTRODE REM PT RTRN 9FT ADLT (ELECTROSURGICAL) ×1 IMPLANT
EVACUATOR SILICONE 100CC (DRAIN) IMPLANT
FEE INTRAOP MONITOR IMPULS NCS (MISCELLANEOUS) ×1 IMPLANT
FILTER STRAW FLUID ASPIR (MISCELLANEOUS) ×3 IMPLANT
GAUZE 4X4 16PLY RFD (DISPOSABLE) ×3 IMPLANT
GAUZE SPONGE 4X4 12PLY STRL (GAUZE/BANDAGES/DRESSINGS) ×3 IMPLANT
GLOVE BIO SURGEON STRL SZ7 (GLOVE) ×3 IMPLANT
GLOVE BIO SURGEON STRL SZ8 (GLOVE) ×5 IMPLANT
GLOVE BIOGEL PI IND STRL 7.0 (GLOVE) ×1 IMPLANT
GLOVE BIOGEL PI IND STRL 8 (GLOVE) ×1 IMPLANT
GLOVE BIOGEL PI INDICATOR 7.0 (GLOVE) ×2
GLOVE BIOGEL PI INDICATOR 8 (GLOVE) ×4
GOWN STRL REUS W/ TWL LRG LVL3 (GOWN DISPOSABLE) ×2 IMPLANT
GOWN STRL REUS W/ TWL XL LVL3 (GOWN DISPOSABLE) ×1 IMPLANT
GOWN STRL REUS W/TWL LRG LVL3 (GOWN DISPOSABLE) ×6
GOWN STRL REUS W/TWL XL LVL3 (GOWN DISPOSABLE) ×6
GRAFT BNE MATRIX VG FRMBL L 10 (Bone Implant) IMPLANT
INTRAOP MONITOR FEE IMPULS NCS (MISCELLANEOUS) ×1
INTRAOP MONITOR FEE IMPULSE (MISCELLANEOUS) ×2
IV CATH 14GX2 1/4 (CATHETERS) ×3 IMPLANT
KIT BASIN OR (CUSTOM PROCEDURE TRAY) ×3 IMPLANT
KIT POSITION SURG JACKSON T1 (MISCELLANEOUS) ×3 IMPLANT
KIT TURNOVER KIT B (KITS) ×3 IMPLANT
MARKER SKIN DUAL TIP RULER LAB (MISCELLANEOUS) ×6 IMPLANT
NDL HYPO 25GX1X1/2 BEV (NEEDLE) ×1 IMPLANT
NDL SAFETY ECLIPSE 18X1.5 (NEEDLE) ×1 IMPLANT
NDL SPNL 18GX3.5 QUINCKE PK (NEEDLE) ×2 IMPLANT
NEEDLE 22X1 1/2 (OR ONLY) (NEEDLE) ×6 IMPLANT
NEEDLE HYPO 18GX1.5 SHARP (NEEDLE) ×3
NEEDLE HYPO 25GX1X1/2 BEV (NEEDLE) ×3 IMPLANT
NEEDLE SPNL 18GX3.5 QUINCKE PK (NEEDLE) ×6 IMPLANT
NS IRRIG 1000ML POUR BTL (IV SOLUTION) ×5 IMPLANT
OIL CARTRIDGE MAESTRO DRILL (MISCELLANEOUS) ×3
PACK LAMINECTOMY ORTHO (CUSTOM PROCEDURE TRAY) ×3 IMPLANT
PACK UNIVERSAL I (CUSTOM PROCEDURE TRAY) ×3 IMPLANT
PAD ARMBOARD 7.5X6 YLW CONV (MISCELLANEOUS) ×6 IMPLANT
PATTIES SURGICAL .5 X1 (DISPOSABLE) ×3 IMPLANT
PATTIES SURGICAL .5X1.5 (GAUZE/BANDAGES/DRESSINGS) ×3 IMPLANT
ROD PRE BENT EXP 40MM (Rod) ×3 IMPLANT
ROD PRE LORDOSED 5.5X45 (Rod) ×2 IMPLANT
SCREW CORTICAL VIPER 7X35 (Screw) ×8 IMPLANT
SCREW SET SINGLE INNER (Screw) ×8 IMPLANT
SPONGE INTESTINAL PEANUT (DISPOSABLE) ×3 IMPLANT
SPONGE SURGIFOAM ABS GEL 100 (HEMOSTASIS) ×3 IMPLANT
STRIP CLOSURE SKIN 1/2X4 (GAUZE/BANDAGES/DRESSINGS) ×2 IMPLANT
SURGIFLO W/THROMBIN 8M KIT (HEMOSTASIS) IMPLANT
SUT MNCRL AB 4-0 PS2 18 (SUTURE) ×3 IMPLANT
SUT VIC AB 0 CT1 18XCR BRD 8 (SUTURE) ×1 IMPLANT
SUT VIC AB 0 CT1 8-18 (SUTURE) ×3
SUT VIC AB 1 CT1 18XCR BRD 8 (SUTURE) ×1 IMPLANT
SUT VIC AB 1 CT1 8-18 (SUTURE) ×3
SUT VIC AB 2-0 CT2 18 VCP726D (SUTURE) ×3 IMPLANT
SYR 20CC LL (SYRINGE) ×6 IMPLANT
SYR BULB IRRIGATION 50ML (SYRINGE) ×3 IMPLANT
SYR CONTROL 10ML LL (SYRINGE) ×6 IMPLANT
SYR TB 1ML LUER SLIP (SYRINGE) ×3 IMPLANT
TAPE CLOTH SURG 4X10 WHT LF (GAUZE/BANDAGES/DRESSINGS) ×3 IMPLANT
TRAY FOLEY MTR SLVR 16FR STAT (SET/KITS/TRAYS/PACK) ×3 IMPLANT
WATER STERILE IRR 1000ML POUR (IV SOLUTION) ×1 IMPLANT
YANKAUER SUCT BULB TIP NO VENT (SUCTIONS) ×3 IMPLANT

## 2017-11-29 NOTE — Op Note (Signed)
MEDICAL RECORD NO.: [id number]   PHYSICIAN:  Phylliss Bob, MD      DATE OF BIRTH:  1941/01/26   DATE OF PROCEDURE:  11/29/2017                               OPERATIVE REPORT     PREOPERATIVE DIAGNOSES: 1. Bilateral lumbar radiculopathy. 2. Severe left L4-5 neuroforaminal stenosis. 3. High grade 1 L4-5 spondylolisthesis 4. Status post previous L4-5 decompression 3 years ago by another provider 5. Dural attenuation noted on preoperative MRI   POSTOPERATIVE DIAGNOSES: 1. Bilateral lumbar radiculopathy. 2. Severe left L4-5 neuroforaminal stenosis. 3. High grade 1 L4-5 spondylolisthesis 4. Status post previous L4-5 decompression 3 years ago by another provider 5. Dural attenuation noted on preoperative MRI   PROCEDURES: 1. Revision L4/5 decompression 2. Right-sided L4-5 transforaminal lumbar interbody fusion. 3. Left-sided L4-5 posterolateral fusion. 4. Insertion of interbody device x1 (61mm Concorde intervertebral spacer). 5. Placement of segmental posterior instrumentation L4, L5 bilaterally  6. Use of local autograft. 7. Use of morselized allograft - Vivigen 8. Intraoperative use of fluoroscopy.   SURGEON:  Phylliss Bob, MD.   ASSISTANTPricilla Holm, PA-C.   ANESTHESIA:  General endotracheal anesthesia.   COMPLICATIONS:  None.   DISPOSITION:  Stable.   ESTIMATED BLOOD LOSS:  200cc   INDICATIONS FOR SURGERY:  Briefly,  Mr. Murin is a pleasant 77 year old male who did present to me with severe and ongoing pain in the right leg. I did feel that the symptoms were secondary to the findings noted above.   The patient failed conservative care and did wish to proceed with the procedure  noted above.  Of note, the patient is status post a previous L4-5 decompression.  His preoperative MRI did reveal substantial attenuation of the dura in the region of the right lateral recess.   OPERATIVE DETAILS:  On  11/29/2017, the patient was brought to surgery and general  endotracheal anesthesia was administered.  The patient was placed prone on a well-padded flat Jackson bed with a spinal frame.  Antibiotics were given and a time-out procedure was performed. The back was prepped and draped in the usual fashion.  A midline incision was made overlying the L4-5 intervertebral spaces.  The fascia was incised at the midline.  The paraspinal musculature was bluntly swept laterally.  Anatomic landmarks for the pedicles were exposed. The lamina of L4 and L5 was identified.  There was substantial scar noted in the region of the epidural space on the right at L4-5, as expected.   Using fluoroscopy, I did cannulate the L4 and L5 pedicles bilaterally, using a medial to lateral cortical trajectory technique.    On the right side, bone wax was placed in the cannulated pedicle holes.  On the left, I did place 7 x 35 mm screws.  I then placed a 45 mm rod.  Distraction was then applied across the rod and caps were placed and provisionally tightened.  I then proceeded with the decompressive aspect of the procedure at the L4-5 level.    This was an extremely meticulous portion of the procedure, given the fact that there was abundant epidural scar noted at the lateral recess on the right at L4-5.  I was extremely meticulous and developing a plane between the dura and previously performed laminotomy.  I was particularly meticulous given the fact that there was attenuation of the dura noted on the patient's preoperative MRI.  This attenuation was encountered at the inferior and medial aspect of the previous laminotomy.  It was very clear that the dura was extremely attenuated.  When this was encountered, I did elect to place Gelfoam over the laminotomy site, and turned my attention more laterally, lateral to the right L4-5 facet joint.  I did wish to make a remeasure in order to avoid extravasation of cerebrospinal fluid, as I did feel that the risk of this was very high if additional  dissection was carried out in the region of the lateral recess on the right.  No extravasation of cerebrospinal fluid was noted.  Then, using a lateral to medial approach, I did perform a full facetectomy on the right at L4-5.  This did allow me to entirely visualize and decompress the exiting right L4 nerve.  The nerve was noted to be under very significant pressure, however, after the full facetectomy, I was able to entirely decompress the exiting right L4 nerve.  This also was a very meticulous portion of the procedure, given the severity of the nerve compression, and also given the fact that I did limit my medial dissection to the medial aspect of the L4 and L5 pedicles, as I did not wish to encounter or manipulate the attenuated dura, which I did feel would very likely result in extravasation of cerebrospinal fluid.  Once the right L4 nerve was decompressed, I did perform an annulotomy at the very lateral aspect of the L4-5 intervertebral disc.  I was able to perform a full discectomy and adequate endplate preparation, although again, I did ensure limiting my dissection very laterally, given the attenuated dura noted on the patient's preoperative MRI. The intervertebral space was then liberally packed with autograft as well as allograft in the form of Vivigen, as was the appropriate-sized intervertebral spacer (10 mm, parallel).  The spacer was then tamped into position in the usual fashion.  I was very pleased with the press-fit of the spacer.  The spacer did rest very laterally, which should help restore the segmental collapse on the right side, and did help me avoid the attenuated dura more medially.  I was pleased with the resting position of the implant.  I then placed 7 mm screws on the right at L4 and L5. A 40-mm rod was then placed and caps were placed. The distraction was then released on the contralateral left side.  All caps were then locked.  The wound was copiously irrigated with a total of  approximately 3 L prior to placing the bone graft.  Additional autograft and allograft was then packed into the posterolateral gutter on the left side to help aid in the success of the fusion.  The wound was  explored for any undue bleeding and there was no substantial bleeding encountered.  Gel-Foam was placed over the laminectomy site.  No extravasation of cerebral spinal fluid was encountered throughout the entire surgery.  The wound was then closed in layers using #1 Vicryl followed by 2-0 Vicryl, followed by 4-0 Monocryl.  Benzoin and Steri-Strips were applied followed by sterile dressing.    Of note, did use triggered EMG to test the screws on the left, and there was no screw the tested below 12 mA. There was no sustained abnormal EMG activity noted throughout the surgery.   Of note, Pricilla Holm was my assistant throughout surgery, and did aid in retraction, suctioning, and closure.     Phylliss Bob, MD

## 2017-11-29 NOTE — H&P (Signed)
PREOPERATIVE H&P  Chief Complaint: Right leg pain  HPI: Keith [name] is a 77 y.o. male who presents with ongoing pain in the right leg  MRI reveals stenosis at L4/5. Patient is s/p a previous decompression at L4/5.  Patient has failed multiple forms of conservative care and continues to have pain (see office notes for additional details regarding the patient's full course of treatment)  Past Medical History:  Diagnosis Date  . Allergic rhinitis   . Cancer ([place])    SKIN CANCER ON LIP (30 YR AGO)  . DDD (degenerative disc disease), cervical   . Hyperlipidemia   . Hypothyroidism   . PONV (postoperative nausea and vomiting)    2016 BACK SURGERY, IN RECOVERY HE HAD N/V   Past Surgical History:  Procedure Laterality Date  . AMPUTATED Right 1961   TRAUMATIC AMPUTATED FINGERS RIGHT   . BACK SURGERY  2016  . TONSILLECTOMY     Social History   Socioeconomic History  . Marital status: Married    Spouse name: Not on file  . Number of children: Not on file  . Years of education: Not on file  . Highest education level: Not on file  Occupational History  . 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 Occupation: Designer, industrial/product bus  Social Needs  . Financial resource strain: Not on file  . Food insecurity:    Worry: Not on file    Inability: Not on file  . Transportation needs:    Medical: Not on file    Non-medical: Not on file  Tobacco Use  . Smoking status: Never Smoker  . Smokeless tobacco: Never Used  Substance and Sexual Activity  . Alcohol use: No  . Drug use: No  . Sexual activity: Not Currently  Lifestyle  . Physical activity:    Days per week: Not on file    Minutes per session: Not on file  . Stress: Not on file  Relationships  . Social connections:    Talks on phone: Not on file    Gets together: Not on file    Attends religious service: Not on file    Active member of club or organization: Not on file    Attends meetings of  clubs or organizations: Not on file    Relationship status: Not on file  Other Topics Concern  . Not on file  Social History Narrative   Regular Exercise -  YES         Family History  Problem Relation Age of Onset  . Diabetes Mother   . Coronary artery disease Father        Fatal MI  . Diabetes Father   . Coronary artery disease Other   . Breast cancer Other    Allergies  Allergen Reactions  . Atorvastatin Other (See Comments)    leg cramps  . Rosuvastatin Other (See Comments)    myalgias   Prior to Admission medications   Medication Sig Start Date End Date Taking? Authorizing Provider  Calcium Carb-Cholecalciferol (CALCIUM + D3 PO) Take 1 tablet by mouth daily.   Yes [provider]  ferrous sulfate 325 (65 FE) MG EC tablet Take 325 mg by mouth daily.   Yes [provider]  fluticasone (FLONASE) 50 MCG/ACT nasal spray instill 2 sprays into each nostril once daily Patient taking differently: Place 2 sprays into both nostrils daily as needed for allergies.  11/24/16  Yes Scarlette Calico  L, MD  levothyroxine (SYNTHROID) 137 MCG tablet Take 1 tablet (137 mcg total) by mouth daily before breakfast. 11/21/17  Yes Janith Lima, MD  Magnesium 250 MG TABS Take 250 mg by mouth daily.   Yes [provider]  Red Yeast Rice 600 MG TABS Take 1,200 mg by mouth at bedtime.    Yes [provider]  traMADol (ULTRAM) 50 MG tablet Take 1 tablet (50 mg total) by mouth every 8 (eight) hours as needed. Patient taking differently: Take 50 mg by mouth every 8 (eight) hours as needed for moderate pain.  11/06/17  Yes Lyndal Pulley, DO  predniSONE (DELTASONE) 5 MG tablet Take 6 pills for first day, 5 pills second day, 4 pills third day, 3 pills fourth day, 2 pills the fifth day, and 1 pill sixth day. Patient not taking: Reported on 11/22/2017 10/13/17   Rosemarie Ax, MD  predniSONE (DELTASONE) 50 MG tablet Take 1 tablet daily. Patient not taking: Reported on  11/22/2017 10/18/17   Lyndal Pulley, DO     All other systems have been reviewed and were otherwise negative with the exception of those mentioned in the HPI and as above.  Physical Exam: Vitals:   11/29/17 0600  BP: (!) 131/100  Pulse: 82  Resp: 18  Temp: 98.2 F (36.8 C)  SpO2: 99%    Body mass index is 27.26 kg/m.  General: Alert, no acute distress Cardiovascular: No pedal edema Respiratory: No cyanosis, no use of accessory musculature Skin: No lesions in the area of chief complaint Neurologic: Sensation intact distally Psychiatric: Patient is competent for consent with normal mood and affect Lymphatic: No axillary or cervical lymphadenopathy  MUSCULOSKELETAL: + SLR ON THE RIGHT  Assessment/Plan: RIGHT LUMBAR RADICULOPATHY Plan for Procedure(s): RIGHT SIDED LUMBAR 4-5 TRANSFORAMINAL LUMBAR INTERBODY FUSION WITH INSTRUMENTATION AND ALLOGRAFT   Norva Karvonen, MD 11/29/2017 6:42 AM

## 2017-11-29 NOTE — Anesthesia Postprocedure Evaluation (Signed)
Anesthesia Post Note  Patient: Keith [name]  Procedure(s) Performed: RIGHT SIDED LUMBAR 4-5 TRANSFORAMINAL LUMBAR INTERBODY FUSION WITH INSTRUMENTATION AND ALLOGRAFT (Right Back)     Patient location during evaluation: PACU Anesthesia Type: General Level of consciousness: awake and alert Pain management: pain level controlled Vital Signs Assessment: post-procedure vital signs reviewed and stable Respiratory status: spontaneous breathing, nonlabored ventilation, respiratory function stable and patient connected to nasal cannula oxygen Cardiovascular status: blood pressure returned to baseline and stable Postop Assessment: no apparent nausea or vomiting Anesthetic complications: no    Last Vitals:  Vitals:   11/29/17 1253 11/29/17 1312  BP: (!) 128/95 (!) 109/98  Pulse: 85 90  Resp: 16 18  Temp: 36.4 C 36.7 C  SpO2: 100% 98%    Last Pain:  Vitals:   11/29/17 1312  TempSrc: Oral  PainSc:                  Tykeria Wawrzyniak L Kember Boch

## 2017-11-29 NOTE — Anesthesia Procedure Notes (Signed)
Procedure Name: Intubation Date/Time: 11/29/2017 7:43 AM Performed by: Myanna Ziesmer T, CRNA Pre-anesthesia Checklist: Patient identified, Emergency Drugs available, Suction available and Patient being monitored Patient Re-evaluated:Patient Re-evaluated prior to induction Oxygen Delivery Method: Circle system utilized Preoxygenation: Pre-oxygenation with 100% oxygen Induction Type: IV induction Ventilation: Mask ventilation without difficulty and Oral airway inserted - appropriate to patient size Laryngoscope Size: Miller and 3 Grade View: Grade I Tube type: Oral Tube size: 7.5 mm Number of attempts: 1 Airway Equipment and Method: Patient positioned with wedge pillow and Stylet Placement Confirmation: ETT inserted through vocal cords under direct vision,  positive ETCO2 and breath sounds checked- equal and bilateral Secured at: 22 cm Tube secured with: Tape Dental Injury: Teeth and Oropharynx as per pre-operative assessment

## 2017-11-29 NOTE — Transfer of Care (Signed)
Immediate Anesthesia Transfer of Care Note  Patient: Keith [name]  Procedure(s) Performed: RIGHT SIDED LUMBAR 4-5 TRANSFORAMINAL LUMBAR INTERBODY FUSION WITH INSTRUMENTATION AND ALLOGRAFT (Right Back)  Patient Location: PACU  Anesthesia Type:General  Level of Consciousness: awake, alert  and oriented  Airway & Oxygen Therapy: Patient Spontanous Breathing and Patient connected to nasal cannula oxygen  Post-op Assessment: Report given to RN and Post -op Vital signs reviewed and stable  Post vital signs: Reviewed,stable  Last Vitals:  Vitals Value Taken Time  BP 113/71 11/29/2017 12:08 PM  Temp 36.4 C 11/29/2017 12:08 PM  Pulse 87 11/29/2017 12:10 PM  Resp 16 11/29/2017 12:10 PM  SpO2 98 % 11/29/2017 12:10 PM  Vitals shown include unvalidated device data.  Last Pain:  Vitals:   11/29/17 1208  TempSrc:   PainSc: (P) Asleep      Patients Stated Pain Goal: 8 (82/64/15 8309)  Complications: No apparent anesthesia complications

## 2017-11-30 MED FILL — Heparin Sodium (Porcine) Inj 1000 Unit/ML: INTRAMUSCULAR | Qty: 30 | Status: AC

## 2017-11-30 MED FILL — Sodium Chloride IV Soln 0.9%: INTRAVENOUS | Qty: 1000 | Status: AC

## 2017-11-30 MED FILL — Thrombin (Recombinant) For Soln 20000 Unit: CUTANEOUS | Qty: 1 | Status: AC

## 2017-11-30 NOTE — Progress Notes (Addendum)
Patient had requested for pain medication prior to discharge but declined when offered to him. Unable to return to pyxis as patient's name was removed. 2 Percocet returned to pharmacist Tamela Gammon at this time.

## 2017-11-30 NOTE — Progress Notes (Signed)
Patient is discharged from room 3C09 at this time. Alert and in stable condition. IV site d/c'd and instructions read to patient and son with understanding verbalized. Left unit via wheelchair with all belongings at side.

## 2017-11-30 NOTE — Progress Notes (Signed)
    Patient doing well  Patient denies leg pain, states that the pain and numbness he had in his right leg is resolved   Physical Exam: Vitals:   11/29/17 2304 11/30/17 0412  BP: (!) 96/58 108/73  Pulse: 75 66  Resp: 20 18  Temp: 98.8 F (37.1 C) 98 F (36.7 C)  SpO2: 96% 99%   Patient appears comfortable Dressing in place NVI  POD #1 s/p L4/5 revision decompression and fusion, doing very well  - up with PT/OT, encourage ambulation - Percocet for pain, Valium for muscle spasms - d/c home today with f/u in 2 weeks

## 2017-11-30 NOTE — Evaluation (Signed)
Physical Therapy Evaluation Patient Details Name: Keith [name] MRN: [id number] DOB: 03-04-1940 Today's Date: 11/30/2017   History of Present Illness  Pt is a 77 y/o male no s/p revision L4/5 decompression; Right-sided L4-5 transforaminal lumbar interbody fusion  Left-sided L4-5 posterolateral fusion with instrumentation and allograft.  Clinical Impression  Pt admitted with above diagnosis. Pt currently with functional limitations due to the deficits listed below (see PT Problem List). At the time of PT eval pt was able to perform transfers and ambulation with gross min guard assist to supervision for safety. Pt and son were educated on brace application/wearing schedule, stair negotiation, car transfer, and safe activity progression. Pt will benefit from skilled PT to increase their independence and safety with mobility to allow discharge to the venue listed below.       Follow Up Recommendations No PT follow up;Supervision for mobility/OOB    Equipment Recommendations  None recommended by PT    Recommendations for Other Services       Precautions / Restrictions Precautions Precautions: Back Precaution Booklet Issued: Yes (comment) Precaution Comments: issued and reviewed with pt/pt's son Required Braces or Orthoses: Spinal Brace Spinal Brace: Thoracolumbosacral orthotic;Applied in sitting position Restrictions Weight Bearing Restrictions: No      Mobility  Bed Mobility               General bed mobility comments: sitting EOB upon arrival. Reviewed log roll technique verbally.   Transfers Overall transfer level: Needs assistance Equipment used: Rolling walker (2 wheeled) Transfers: Sit to/from Stand Sit to Stand: Min guard         General transfer comment: VCs for safe hand placement, min guard for safety though no physical assist required  Ambulation/Gait Ambulation/Gait assistance: Min guard Gait Distance (Feet): 225 Feet Assistive device: Rolling walker (2  wheeled) Gait Pattern/deviations: Step-through pattern;Decreased stride length;Decreased weight shift to right Gait velocity: Decreased Gait velocity interpretation: 1.31 - 2.62 ft/sec, indicative of limited community ambulator General Gait Details: VC's for improved posture and general safety with the RW for support. Pt was able to perform transfers and ambulation with gross   Stairs Stairs: Yes Stairs assistance: Min guard Stair Management: One rail Right;Sideways;Step to pattern Number of Stairs: 3 General stair comments: VC's for sequencing and safety with sideways negotiation of stairs. Pt and son educated to avoid twisting and make sure hips and shoulders are squared to railing at home.   Wheelchair Mobility    Modified Rankin (Stroke Patients Only)       Balance Overall balance assessment: Needs assistance Sitting-balance support: No upper extremity supported;Feet supported Sitting balance-Leahy Scale: Good     Standing balance support: Bilateral upper extremity supported;No upper extremity supported;During functional activity Standing balance-Leahy Scale: Fair Standing balance comment: able to static stand without UE support and minguard for safety; use of RW for dynamic mobility                             Pertinent Vitals/Pain Pain Assessment: Faces Faces Pain Scale: Hurts a little bit Pain Location: back at incision site Pain Descriptors / Indicators: Sore Pain Intervention(s): Monitored during session    Home Living Family/patient expects to be discharged to:: Private residence Living Arrangements: Spouse/significant other;Children Available Help at Discharge: Family;Available PRN/intermittently Type of Home: House Home Access: Stairs to enter Entrance Stairs-Rails: Right Entrance Stairs-Number of Steps: 3 Home Layout: One level Home Equipment: Walker - 2 wheels;Shower seat Additional Comments: pt's reports  spouse has dementia and he assists as  caregiver for her; son works during the day    Prior Function Level of Independence: Independent with assistive device(s)         Comments: reports using RW for ambulation due to pain     Hand Dominance        Extremity/Trunk Assessment   Upper Extremity Assessment Upper Extremity Assessment: Overall WFL for tasks assessed    Lower Extremity Assessment Lower Extremity Assessment: Generalized weakness    Cervical / Trunk Assessment Cervical / Trunk Assessment: Other exceptions Cervical / Trunk Exceptions: s/p lumbar surgery  Communication   Communication: No difficulties  Cognition Arousal/Alertness: Awake/alert Behavior During Therapy: WFL for tasks assessed/performed Overall Cognitive Status: Within Functional Limits for tasks assessed                                        General Comments      Exercises     Assessment/Plan    PT Assessment Patent does not need any further PT services  PT Problem List         PT Treatment Interventions      PT Goals (Current goals can be found in the Care Plan section)  Acute Rehab PT Goals Patient Stated Goal: home today PT Goal Formulation: All assessment and education complete, DC therapy    Frequency     Barriers to discharge        Co-evaluation               AM-PAC PT "6 Clicks" Daily Activity  Outcome Measure Difficulty turning over in bed (including adjusting bedclothes, sheets and blankets)?: None Difficulty moving from lying on back to sitting on the side of the bed? : A Little Difficulty sitting down on and standing up from a chair with arms (e.g., wheelchair, bedside commode, etc,.)?: A Little Help needed moving to and from a bed to chair (including a wheelchair)?: A Little Help needed walking in hospital room?: A Little Help needed climbing 3-5 steps with a railing? : A Little 6 Click Score: 19    End of Session Equipment Utilized During Treatment: Gait belt;Back  brace Activity Tolerance: Patient tolerated treatment well Patient left: with call bell/phone within reach;with family/visitor present(Sitting EOB) Nurse Communication: Mobility status PT Visit Diagnosis: Unsteadiness on feet (R26.81);Pain Pain - part of body: (back)    Time: 7619-5093 PT Time Calculation (min) (ACUTE ONLY): 21 min   Charges:   PT Evaluation $PT Eval Moderate Complexity: 1 Mod          Rolinda Roan, PT, DPT Acute Rehabilitation Services Pager: [phone] Office: [phone]   Thelma Comp 11/30/2017, 11:51 AM

## 2017-11-30 NOTE — Evaluation (Signed)
Occupational Therapy Evaluation Patient Details Name: Keith [name] MRN: [id number] DOB: Dec 02, 1940 Today's Date: 11/30/2017    History of Present Illness Pt is a 77 y/o male no s/p revision L4/5 decompression; Right-sided L4-5 transforaminal lumbar interbody fusion  Left-sided L4-5 posterolateral fusion with instrumentation and allograft.   Clinical Impression   This 77 y/o male presents with the above. At baseline pt is mod independent with ADLs and functional mobility, lives with son and pt's spouse who pt provides some assist for ADLs. Pt demonstrating functional mobility using RW with minguard assist this session. Currently requires minA for UB ADL/brace management, minguard assist for LB ADLs. Educated both pt/pt's son regarding back precautions, safety and compensatory techniques for completing ADLs and functional transfers while maintaining back precautions. Pt verbalizing and return demonstrating understanding, though does require intermittent cues for maintaining back precautions during functional tasks this session. Will continue to follow while pt remains in acute setting to maximize pt's overall safety and independence with ADLs and mobility prior to discharge home.     Follow Up Recommendations  No OT follow up;Supervision/Assistance - 24 hour(24hr initially)    Equipment Recommendations  None recommended by OT           Precautions / Restrictions Precautions Precautions: Back Precaution Booklet Issued: Yes (comment) Precaution Comments: issued and reviewed with pt/pt's son Required Braces or Orthoses: Spinal Brace Spinal Brace: Thoracolumbosacral orthotic;Applied in sitting position Restrictions Weight Bearing Restrictions: No      Mobility Bed Mobility               General bed mobility comments: sitting EOB upon arrival  Transfers Overall transfer level: Needs assistance Equipment used: Rolling walker (2 wheeled) Transfers: Sit to/from Stand Sit to  Stand: Min guard         General transfer comment: VCs for safe hand placement, minguard for safety though no physical assist required    Balance Overall balance assessment: Needs assistance Sitting-balance support: No upper extremity supported;Feet supported Sitting balance-Leahy Scale: Good     Standing balance support: Bilateral upper extremity supported;No upper extremity supported;During functional activity Standing balance-Leahy Scale: Fair Standing balance comment: able to static stand without UE support and minguard for safety; use of RW for dynamic mobility                           ADL either performed or assessed with clinical judgement   ADL Overall ADL's : Needs assistance/impaired Eating/Feeding: Independent;Sitting   Grooming: Supervision/safety;Standing Grooming Details (indicate cue type and reason): min cues for maintaining back precautions during standing grooming ADLs Upper Body Bathing: Min guard;Sitting   Lower Body Bathing: Min guard;Sit to/from stand   Upper Body Dressing : Minimal assistance;Sitting Upper Body Dressing Details (indicate cue type and reason): for brace management; reviewed donning/doffing without external assist as pt reports he will most likely be completing without assist at home Lower Body Dressing: Min guard;Sit to/from stand Lower Body Dressing Details (indicate cue type and reason): donning underwear/pants; minguard for adherence to back precautions and standing balance Toilet Transfer: Min guard;Ambulation;RW Toilet Transfer Details (indicate cue type and reason): simulated in transfer to/from EOB Toileting- Clothing Manipulation and Hygiene: Min guard;Sit to/from Nurse, children's Details (indicate cue type and reason): verbally reviewed safe transfer techniques and use of shower seat for increased safety/adherence to back precautions Functional mobility during ADLs: Min guard;Rolling walker General ADL  Comments: pt requires cues for safe  proximity to RW; cues for adhering to back precautions during session     Vision         Perception     Praxis      Pertinent Vitals/Pain Pain Assessment: Faces Faces Pain Scale: Hurts a little bit Pain Location: back at incision site Pain Descriptors / Indicators: Sore Pain Intervention(s): Monitored during session;Premedicated before session     Hand Dominance     Extremity/Trunk Assessment Upper Extremity Assessment Upper Extremity Assessment: Overall WFL for tasks assessed   Lower Extremity Assessment Lower Extremity Assessment: Defer to PT evaluation   Cervical / Trunk Assessment Cervical / Trunk Assessment: Other exceptions Cervical / Trunk Exceptions: s/p lumbar surgery   Communication Communication Communication: No difficulties   Cognition Arousal/Alertness: Awake/alert Behavior During Therapy: WFL for tasks assessed/performed Overall Cognitive Status: Within Functional Limits for tasks assessed                                     General Comments       Exercises     Shoulder Instructions      Home Living Family/patient expects to be discharged to:: Private residence Living Arrangements: Spouse/significant other;Children Available Help at Discharge: Family;Available PRN/intermittently Type of Home: House Home Access: Stairs to enter [employer] of Steps: 3 Entrance Stairs-Rails: Right Home Layout: One level     Bathroom Shower/Tub: Tub/shower unit;Walk-in shower   Bathroom Toilet: Handicapped height     Home Equipment: Environmental consultant - 2 wheels;Shower seat   Additional Comments: pt's reports spouse has dementia and he assists as caregiver for her; son works during the day      Prior Functioning/Environment Level of Independence: Independent with assistive device(s)        Comments: reports using RW for ambulation due to pain        OT Problem List: Decreased strength;Decreased  range of motion;Decreased activity tolerance;Impaired balance (sitting and/or standing);Decreased knowledge of precautions      OT Treatment/Interventions:      OT Goals(Current goals can be found in the care plan section) Acute Rehab OT Goals Patient Stated Goal: home today OT Goal Formulation: With patient Time For Goal Achievement: 12/14/17 Potential to Achieve Goals: Good  OT Frequency:     Barriers to D/C:            Co-evaluation              AM-PAC PT "6 Clicks" Daily Activity     Outcome Measure Help from another person eating meals?: None Help from another person taking care of personal grooming?: A Little Help from another person toileting, which includes using toliet, bedpan, or urinal?: A Little Help from another person bathing (including washing, rinsing, drying)?: A Little Help from another person to put on and taking off regular upper body clothing?: A Little Help from another person to put on and taking off regular lower body clothing?: A Little 6 Click Score: 19   End of Session Equipment Utilized During Treatment: Gait belt;Rolling walker;Back brace Nurse Communication: Mobility status  Activity Tolerance: Patient tolerated treatment well Patient left: with family/visitor present;with call bell/phone within reach(sitting EOB)  OT Visit Diagnosis: Other abnormalities of gait and mobility (R26.89)                Time: 8841-6606 OT Time Calculation (min): 29 min Charges:  OT General Charges $OT Visit: 1 Visit OT Evaluation $OT Eval  Low Complexity: 1 Low OT Treatments $Self Care/Home Management : 8-22 mins  Lou Cal, OT Supplemental Rehabilitation Services Pager [phone] Office [phone]  Raymondo Band 11/30/2017, 9:04 AM

## 2017-12-01 ENCOUNTER — Other Ambulatory Visit: Payer: Self-pay | Admitting: Internal Medicine

## 2017-12-01 DIAGNOSIS — E039 Hypothyroidism, unspecified: Secondary | ICD-10-CM

## 2017-12-06 ENCOUNTER — Encounter (HOSPITAL_COMMUNITY): Payer: Self-pay | Admitting: Orthopedic Surgery

## 2017-12-06 NOTE — Discharge Summary (Signed)
Patient ID: Keith [name] MRN: [id number] DOB/AGE: 1940-06-25 77 y.o.  Admit date: 11/29/2017 Discharge date: 11/30/2017  Admission Diagnoses:  Active Problems:   Radiculopathy   Discharge Diagnoses:  Same  Past Medical History:  Diagnosis Date  . Allergic rhinitis   . Cancer ([place])    SKIN CANCER ON LIP (30 YR AGO)  . DDD (degenerative disc disease), cervical   . Hyperlipidemia   . Hypothyroidism   . PONV (postoperative nausea and vomiting)    2016 BACK SURGERY, IN RECOVERY HE HAD N/V    Surgeries: Procedure(s): RIGHT SIDED LUMBAR 4-5 TRANSFORAMINAL LUMBAR INTERBODY FUSION WITH INSTRUMENTATION AND ALLOGRAFT on 11/29/2017   Consultants: None  Discharged Condition: Improved  Hospital Course: Keith [name] is an 77 y.o. male who was admitted 11/29/2017 for operative treatment of radiculopathy. Patient has severe unremitting pain that affects sleep, daily activities, and work/hobbies. After pre-op clearance the patient was taken to the operating room on 11/29/2017 and underwent  Procedure(s): RIGHT SIDED LUMBAR 4-5 TRANSFORAMINAL LUMBAR INTERBODY FUSION WITH INSTRUMENTATION AND ALLOGRAFT.    Patient was given perioperative antibiotics:  Anti-infectives (From admission, onward)   Start     Dose/Rate Route Frequency Ordered Stop   11/29/17 1600  ceFAZolin (ANCEF) IVPB 2g/100 mL premix     2 g 200 mL/hr over 30 Minutes Intravenous Every 8 hours 11/29/17 1331 11/29/17 2352   11/29/17 0600  ceFAZolin (ANCEF) IVPB 2g/100 mL premix     2 g 200 mL/hr over 30 Minutes Intravenous On call to O.R. 11/29/17 0543 11/29/17 0748       Patient was given sequential compression devices, early ambulation to prevent DVT.  Patient benefited maximally from hospital stay and there were no complications.    Recent vital signs: BP 127/84 (BP Location: Right Arm)   Pulse 82   Temp 99 F (37.2 C) (Oral)   Resp 19   Ht 5\' 10"  (1.778 m)   Wt 86.2 kg   SpO2 99%   BMI 27.26 kg/m     Discharge Medications:   Allergies as of 11/30/2017      Reactions   Atorvastatin Other (See Comments)   leg cramps   Rosuvastatin Other (See Comments)   myalgias      Medication List    TAKE these medications   CALCIUM + D3 PO Take 1 tablet by mouth daily.   ferrous sulfate 325 (65 FE) MG EC tablet Take 325 mg by mouth daily.   fluticasone 50 MCG/ACT nasal spray Commonly known as:  FLONASE instill 2 sprays into each nostril once daily What changed:  See the new instructions.   levothyroxine 137 MCG tablet Commonly known as:  SYNTHROID, LEVOTHROID Take 1 tablet (137 mcg total) by mouth daily before breakfast.   Magnesium 250 MG Tabs Take 250 mg by mouth daily.   Red Yeast Rice 600 MG Tabs Take 1,200 mg by mouth at bedtime.       Diagnostic Studies: Dg Lumbar Spine 2-3 Views  Result Date: 11/29/2017 CLINICAL DATA:  L4-5 fusion. EXAM: LUMBAR SPINE - 2-3 VIEW; DG C-ARM 61-120 MIN Radiation exposure index: 43.4 mGy. COMPARISON:  Radiograph of November 29, 2017. FINDINGS: Two intraoperative fluoroscopic images of the lower lumbar spine demonstrate the patient be status post surgical posterior fusion of L4-5 with bilateral intrapedicular screw placement and interbody fusion. IMPRESSION: Status post surgical posterior fusion of L4-5. Electronically Signed   By: Marijo Conception, M.D.   On: 11/29/2017 13:10  Dg Lumbar Spine 1 View  Result Date: 11/29/2017 CLINICAL DATA:  L4-5 TLIF EXAM: LUMBAR SPINE - 1 VIEW COMPARISON:  10/17/2017 FINDINGS: Intraoperative lateral radiograph labeled # 1 demonstrates the lower of 2 needle type probes oriented towards the L5 spinous process and the L5-S1 intervertebral space; and the upper of the 2 needle type probes oriented towards the top of the L4 spinous process, and the L4-5 intervertebral space. As on the prior MRI, there is grade 1 anterolisthesis at L4-5 as well as vacuum disc phenomenon and loss of disc height at the L2-3 level.  IMPRESSION: 1. Probe localization of the L4-5 and L5-S1 intervertebral levels as noted above. Electronically Signed   By: Van Clines M.D.   On: 11/29/2017 11:11   Dg C-arm 1-60 Min  Result Date: 11/29/2017 CLINICAL DATA:  L4-5 fusion. EXAM: LUMBAR SPINE - 2-3 VIEW; DG C-ARM 61-120 MIN Radiation exposure index: 43.4 mGy. COMPARISON:  Radiograph of November 29, 2017. FINDINGS: Two intraoperative fluoroscopic images of the lower lumbar spine demonstrate the patient be status post surgical posterior fusion of L4-5 with bilateral intrapedicular screw placement and interbody fusion. IMPRESSION: Status post surgical posterior fusion of L4-5. Electronically Signed   By: Marijo Conception, M.D.   On: 11/29/2017 13:10   Dg C-arm 1-60 Min  Result Date: 11/29/2017 CLINICAL DATA:  L4-5 fusion. EXAM: LUMBAR SPINE - 2-3 VIEW; DG C-ARM 61-120 MIN Radiation exposure index: 43.4 mGy. COMPARISON:  Radiograph of November 29, 2017. FINDINGS: Two intraoperative fluoroscopic images of the lower lumbar spine demonstrate the patient be status post surgical posterior fusion of L4-5 with bilateral intrapedicular screw placement and interbody fusion. IMPRESSION: Status post surgical posterior fusion of L4-5. Electronically Signed   By: Marijo Conception, M.D.   On: 11/29/2017 13:10   Dg C-arm 1-60 Min  Result Date: 11/29/2017 CLINICAL DATA:  L4-5 fusion. EXAM: LUMBAR SPINE - 2-3 VIEW; DG C-ARM 61-120 MIN Radiation exposure index: 43.4 mGy. COMPARISON:  Radiograph of November 29, 2017. FINDINGS: Two intraoperative fluoroscopic images of the lower lumbar spine demonstrate the patient be status post surgical posterior fusion of L4-5 with bilateral intrapedicular screw placement and interbody fusion. IMPRESSION: Status post surgical posterior fusion of L4-5. Electronically Signed   By: Marijo Conception, M.D.   On: 11/29/2017 13:10    Disposition:    POD #1 s/p L4/5 revision decompression and fusion, doing very well  - up  with PT/OT, encourage ambulation - Percocet for pain, Valium for muscle spasms -Written scripts for pain signed and in chart -D/C instructions sheet printed and in chart -D/C today  -F/U in office 2 weeks   Signed: Lennie Muckle Yukio Bisping 12/06/2017, 9:57 AM

## 2018-01-01 ENCOUNTER — Ambulatory Visit: Payer: Medicare Other | Admitting: Internal Medicine

## 2018-01-01 ENCOUNTER — Other Ambulatory Visit (INDEPENDENT_AMBULATORY_CARE_PROVIDER_SITE_OTHER): Payer: Medicare Other

## 2018-01-01 ENCOUNTER — Encounter: Payer: Self-pay | Admitting: Internal Medicine

## 2018-01-01 VITALS — BP 120/80 | HR 71 | Temp 98.2°F | Resp 16 | Ht 70.0 in | Wt 194.5 lb

## 2018-01-01 DIAGNOSIS — E039 Hypothyroidism, unspecified: Secondary | ICD-10-CM | POA: Diagnosis not present

## 2018-01-01 DIAGNOSIS — Z23 Encounter for immunization: Secondary | ICD-10-CM | POA: Diagnosis not present

## 2018-01-01 DIAGNOSIS — N402 Nodular prostate without lower urinary tract symptoms: Secondary | ICD-10-CM

## 2018-01-01 DIAGNOSIS — E785 Hyperlipidemia, unspecified: Secondary | ICD-10-CM | POA: Diagnosis not present

## 2018-01-01 DIAGNOSIS — R739 Hyperglycemia, unspecified: Secondary | ICD-10-CM

## 2018-01-01 DIAGNOSIS — D692 Other nonthrombocytopenic purpura: Secondary | ICD-10-CM | POA: Diagnosis not present

## 2018-01-01 LAB — CBC WITH DIFFERENTIAL/PLATELET
BASOS ABS: 0.1 10*3/uL (ref 0.0–0.1)
Basophils Relative: 1.4 % (ref 0.0–3.0)
Eosinophils Absolute: 0.2 10*3/uL (ref 0.0–0.7)
Eosinophils Relative: 1.8 % (ref 0.0–5.0)
HCT: 44.4 % (ref 39.0–52.0)
HEMOGLOBIN: 14.8 g/dL (ref 13.0–17.0)
Lymphocytes Relative: 19.8 % (ref 12.0–46.0)
Lymphs Abs: 1.8 10*3/uL (ref 0.7–4.0)
MCHC: 33.4 g/dL (ref 30.0–36.0)
MCV: 93.3 fl (ref 78.0–100.0)
MONO ABS: 1 10*3/uL (ref 0.1–1.0)
MONOS PCT: 11.3 % (ref 3.0–12.0)
Neutro Abs: 6 10*3/uL (ref 1.4–7.7)
Neutrophils Relative %: 65.7 % (ref 43.0–77.0)
Platelets: 225 10*3/uL (ref 150.0–400.0)
RBC: 4.76 Mil/uL (ref 4.22–5.81)
RDW: 14.5 % (ref 11.5–15.5)
WBC: 9.2 10*3/uL (ref 4.0–10.5)

## 2018-01-01 LAB — COMPREHENSIVE METABOLIC PANEL
ALT: 11 U/L (ref 0–53)
AST: 14 U/L (ref 0–37)
Albumin: 3.8 g/dL (ref 3.5–5.2)
Alkaline Phosphatase: 57 U/L (ref 39–117)
BILIRUBIN TOTAL: 0.6 mg/dL (ref 0.2–1.2)
BUN: 11 mg/dL (ref 6–23)
CO2: 27 meq/L (ref 19–32)
CREATININE: 0.92 mg/dL (ref 0.40–1.50)
Calcium: 8.8 mg/dL (ref 8.4–10.5)
Chloride: 101 mEq/L (ref 96–112)
GFR: 84.95 mL/min (ref >60.00)
GLUCOSE: 102 mg/dL — AB (ref 70–99)
Potassium: 4.3 mEq/L (ref 3.5–5.1)
Sodium: 137 mEq/L (ref 135–145)
Total Protein: 6.5 g/dL (ref 6.0–8.3)

## 2018-01-01 LAB — PSA: PSA: 3.35 ng/mL (ref 0.10–4.00)

## 2018-01-01 LAB — LIPID PANEL
CHOL/HDL RATIO: 5
Cholesterol: 167 mg/dL (ref 0–200)
HDL: 32.3 mg/dL — AB (ref >39.00)
LDL CALC: 102 mg/dL — AB (ref 0–99)
NONHDL: 134.58
Triglycerides: 165 mg/dL — ABNORMAL HIGH (ref 0.0–149.0)
VLDL: 33 mg/dL (ref 0.0–40.0)

## 2018-01-01 LAB — HEMOGLOBIN A1C: Hgb A1c MFr Bld: 5.9 % (ref 4.6–6.5)

## 2018-01-01 LAB — TSH: TSH: 0.7 u[IU]/mL (ref 0.35–4.50)

## 2018-01-01 NOTE — Progress Notes (Signed)
Subjective:  Patient ID: Keith [name], male    DOB: 1941-01-20  Age: 77 y.o. MRN: [id number]  CC: Hypothyroidism   HPI Keith [name] presents for f/up - He is status post back surgery and is wearing a brace.  He otherwise feels well and offers no complaints.  Outpatient Medications Prior to Visit  Medication Sig Dispense Refill  . Calcium Carb-Cholecalciferol (CALCIUM + D3 PO) Take 1 tablet by mouth daily.    . ferrous sulfate 325 (65 FE) MG EC tablet Take 325 mg by mouth daily.    . fluticasone (FLONASE) 50 MCG/ACT nasal spray instill 2 sprays into each nostril once daily (Patient taking differently: Place 2 sprays into both nostrils daily as needed for allergies. ) 16 g 11  . Magnesium 250 MG TABS Take 250 mg by mouth daily.    Marland Kitchen levothyroxine (SYNTHROID) 137 MCG tablet Take 1 tablet (137 mcg total) by mouth daily before breakfast. 90 tablet 0  . levothyroxine (SYNTHROID, LEVOTHROID) 137 MCG tablet TAKE 1 TABLET(137 MCG) BY MOUTH DAILY BEFORE BREAKFAST 90 tablet 0  . Red Yeast Rice 600 MG TABS Take 1,200 mg by mouth at bedtime.      No facility-administered medications prior to visit.     ROS Review of Systems  Constitutional: Negative for diaphoresis and fatigue.  HENT: Negative.   Eyes: Negative for visual disturbance.  Respiratory: Negative for cough, chest tightness, shortness of breath and wheezing.   Cardiovascular: Negative for chest pain, palpitations and leg swelling.  Gastrointestinal: Negative for abdominal pain, constipation, diarrhea and vomiting.  Endocrine: Negative for cold intolerance and heat intolerance.  Genitourinary: Negative.  Negative for difficulty urinating.  Musculoskeletal: Positive for back pain. Negative for arthralgias and myalgias.  Skin: Negative.   Neurological: Negative.  Negative for dizziness, weakness and light-headedness.  Hematological: Negative for adenopathy. Does not bruise/bleed easily.  Psychiatric/Behavioral: Negative.      Objective:  BP 120/80 (BP Location: Right Arm, Patient Position: Sitting, Cuff Size: Normal)   Pulse 71   Temp 98.2 F (36.8 C) (Oral)   Resp 16   Ht 5\' 10"  (1.778 m)   Wt 194 lb 8 oz (88.2 kg)   SpO2 97%   BMI 27.91 kg/m   BP Readings from Last 3 Encounters:  01/01/18 120/80  11/30/17 127/84  11/27/17 (!) 142/82    Wt Readings from Last 3 Encounters:  01/01/18 194 lb 8 oz (88.2 kg)  11/29/17 190 lb (86.2 kg)  11/27/17 190 lb 14.4 oz (86.6 kg)    Physical Exam  Constitutional: He is oriented to person, place, and time. No distress.  HENT:  Mouth/Throat: Oropharynx is clear and moist. No oropharyngeal exudate.  Eyes: Conjunctivae are normal. No scleral icterus.  Neck: Normal range of motion. Neck supple. No JVD present. No thyromegaly present.  Cardiovascular: Normal rate, regular rhythm and normal heart sounds. Exam reveals no gallop.  No murmur heard. Pulmonary/Chest: Effort normal and breath sounds normal. No respiratory distress. He has no wheezes. He has no rhonchi. He has no rales.  Abdominal: Soft. Bowel sounds are normal. He exhibits no mass. There is no hepatosplenomegaly. There is no tenderness.  Musculoskeletal: Normal range of motion. He exhibits no edema, tenderness or deformity.  Lymphadenopathy:    He has no cervical adenopathy.  Neurological: He is alert and oriented to person, place, and time.  Skin: Skin is warm and dry. No rash noted. He is not diaphoretic.  Vitals reviewed.   Lab Results  Component Value Date   WBC 9.2 01/01/2018   HGB 14.8 01/01/2018   HCT 44.4 01/01/2018   PLT 225.0 01/01/2018   GLUCOSE 102 (H) 01/01/2018   CHOL 167 01/01/2018   TRIG 165.0 (H) 01/01/2018   HDL 32.30 (L) 01/01/2018   LDLCALC 102 (H) 01/01/2018   ALT 11 01/01/2018   AST 14 01/01/2018   NA 137 01/01/2018   K 4.3 01/01/2018   CL 101 01/01/2018   CREATININE 0.92 01/01/2018   BUN 11 01/01/2018   CO2 27 01/01/2018   TSH 0.70 01/01/2018   PSA 3.35  01/01/2018   INR 1.01 11/27/2017   HGBA1C 5.9 01/01/2018    No results found.  Assessment & Plan:   Alvis was seen today for hypothyroidism.  Diagnoses and all orders for this visit:  Need for influenza vaccination -     Flu vaccine HIGH DOSE PF (Fluzone High dose)  Acquired hypothyroidism- His TSH is in the normal range.  He will remain on the current dose of levothyroxine. -     TSH; Future -     levothyroxine (SYNTHROID, LEVOTHROID) 137 MCG tablet; TAKE 1 TABLET(137 MCG) BY MOUTH DAILY BEFORE BREAKFAST  Prostate nodule- His PSA is normal which is reassuring that he does not have prostate cancer. -     PSA; Future  Hyperlipidemia with target LDL less than 130- He has an elevated ASCVD risk score so I have asked him to take a statin for CV risk reduction. -     Lipid panel; Future -     Comprehensive metabolic panel; Future -     Pitavastatin Calcium (LIVALO) 1 MG TABS; Take 1 tablet (1 mg total) by mouth daily.  Senile purpura (HCC)-his platelet count is normal. -     CBC with Differential/Platelet; Future  Hyperglycemia- His A1c is at 5.9%.  He is prediabetic.  Medical therapy is not indicated. -     Comprehensive metabolic panel; Future -     Hemoglobin A1c; Future   I have discontinued Jacy A. Wester's Red Yeast Rice. I am also having him start on Pitavastatin Calcium. Additionally, I am having him maintain his fluticasone, ferrous sulfate, Calcium Carb-Cholecalciferol (CALCIUM + D3 PO), Magnesium, and levothyroxine.  Meds ordered this encounter  Medications  . Pitavastatin Calcium (LIVALO) 1 MG TABS    Sig: Take 1 tablet (1 mg total) by mouth daily.    Dispense:  90 tablet    Refill:  1  . levothyroxine (SYNTHROID, LEVOTHROID) 137 MCG tablet    Sig: TAKE 1 TABLET(137 MCG) BY MOUTH DAILY BEFORE BREAKFAST    Dispense:  90 tablet    Refill:  1     Follow-up: Return in about 4 months (around 05/02/2018).  Scarlette Calico, MD

## 2018-01-01 NOTE — Patient Instructions (Signed)
Hypothyroidism Hypothyroidism is a disorder of the thyroid. The thyroid is a large gland that is located in the lower front of the neck. The thyroid releases hormones that control how the body works. With hypothyroidism, the thyroid does not make enough of these hormones. What are the causes? Causes of hypothyroidism may include:  Viral infections.  Pregnancy.  Your own defense system (immune system) attacking your thyroid.  Certain medicines.  Birth defects.  Past radiation treatments to your head or neck.  Past treatment with radioactive iodine.  Past surgical removal of part or all of your thyroid.  Problems with the gland that is located in the center of your brain (pituitary).  What are the signs or symptoms? Signs and symptoms of hypothyroidism may include:  Feeling as though you have no energy (lethargy).  Inability to tolerate cold.  Weight gain that is not explained by a change in diet or exercise habits.  Dry skin.  Coarse hair.  Menstrual irregularity.  Slowing of thought processes.  Constipation.  Sadness or depression.  How is this diagnosed? Your health care provider may diagnose hypothyroidism with blood tests and ultrasound tests. How is this treated? Hypothyroidism is treated with medicine that replaces the hormones that your body does not make. After you begin treatment, it may take several weeks for symptoms to go away. Follow these instructions at home:  Take medicines only as directed by your health care provider.  If you start taking any new medicines, tell your health care provider.  Keep all follow-up visits as directed by your health care provider. This is important. As your condition improves, your dosage needs may change. You will need to have blood tests regularly so that your health care provider can watch your condition. Contact a health care provider if:  Your symptoms do not get better with treatment.  You are taking thyroid  replacement medicine and: ? You sweat excessively. ? You have tremors. ? You feel anxious. ? You lose weight rapidly. ? You cannot tolerate heat. ? You have emotional swings. ? You have diarrhea. ? You feel weak. Get help right away if:  You develop chest pain.  You develop an irregular heartbeat.  You develop a rapid heartbeat. This information is not intended to replace advice given to you by your health care provider. Make sure you discuss any questions you have with your health care provider. Document Released: 01/24/2005 Document Revised: 07/02/2015 Document Reviewed: 06/11/2013 Elsevier Interactive Patient Education  2018 [employer] Inc.  

## 2018-01-02 ENCOUNTER — Encounter: Payer: Self-pay | Admitting: Internal Medicine

## 2018-01-02 MED ORDER — LEVOTHYROXINE SODIUM 137 MCG PO TABS: ORAL_TABLET | ORAL | 1 refills | Status: DC

## 2018-01-02 MED ORDER — PITAVASTATIN CALCIUM 1 MG PO TABS: 1.0000 | ORAL_TABLET | Freq: Every day | ORAL | 1 refills | Status: DC

## 2018-01-08 ENCOUNTER — Ambulatory Visit: Payer: Self-pay

## 2018-01-08 NOTE — Telephone Encounter (Signed)
Pt. Asking if provider did start him on a new cholesterol lowering medication. His provider did send in Pitavastatin to his pharmacy. Pt. Verbalizes understanding.

## 2018-02-14 NOTE — Progress Notes (Signed)
[name] [address], [place] [address] Phone: [phone] Subjective:   Keith [name], am serving as a scribe for Dr. Hulan Saas.   CC: Right shoulder pain, left hand pain  LYY:[id number]  Keith [name] is a 78 y.o. male coming in with complaint of right shoulder pain. Has had relief from injections and would like another one today. Pain is over anterior shoulder. Has trouble sleeping on that side.  History of bursitis.  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 Has had injection last one was 5 months ago.   Patient was having back pain again as well and some mild weakness.  Patient did not see a orthopedic surgeon had back surgery with a fusion.  Feeling significantly better.  Past Medical History:  Diagnosis Date  . Allergic rhinitis   . Cancer ([place])    SKIN CANCER ON LIP (30 YR AGO)  . DDD (degenerative disc disease), cervical   . Hyperlipidemia   . Hypothyroidism   . PONV (postoperative nausea and vomiting)    2016 BACK SURGERY, IN RECOVERY HE HAD N/V   Past Surgical History:  Procedure Laterality Date  . AMPUTATED Right 1961   TRAUMATIC AMPUTATED FINGERS RIGHT   . BACK SURGERY  2016  . TONSILLECTOMY    . TRANSFORAMINAL LUMBAR INTERBODY FUSION (TLIF) WITH PEDICLE SCREW FIXATION 1 LEVEL Right 11/29/2017   Procedure: RIGHT SIDED LUMBAR 4-5 TRANSFORAMINAL LUMBAR INTERBODY FUSION WITH INSTRUMENTATION AND ALLOGRAFT;  Surgeon: Phylliss Bob, MD;  Location: [facility];  Service: Orthopedics;  Laterality: Right;   Social History   Socioeconomic History  . Marital status: Married    Spouse name: Not on file  . Number of children: Not on file  . Years of education: Not on file  . Highest education level: Not on file  Occupational History  . 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 Occupation: Designer, industrial/product bus  Social Needs  .  Financial resource strain: Not on file  . Food insecurity:    Worry: Not on file    Inability: Not on file  . Transportation needs:    Medical: Not on file    Non-medical: Not on file  Tobacco Use  . Smoking status: Never Smoker  . Smokeless tobacco: Never Used  Substance and Sexual Activity  . Alcohol use: [name]  . Drug use: [name]  . Sexual activity: Not Currently  Lifestyle  . Physical activity:    Days per week: Not on file    Minutes per session: Not on file  . Stress: Not on file  Relationships  . Social connections:    Talks on phone: Not on file    Gets together: Not on file    Attends religious service: Not on file    Active member of club or organization: Not on file    Attends meetings of clubs or organizations: Not on file    Relationship status: Not on file  Other Topics Concern  . Not on file  Social History Narrative   Regular Exercise -  YES         Allergies  Allergen Reactions  . Atorvastatin Other (See Comments)    leg cramps  . Rosuvastatin Other (See Comments)    myalgias   Family History  Problem Relation Age of Onset  . Diabetes Mother   . Coronary  artery disease Father        Fatal MI  . Diabetes Father   . Coronary artery disease Other   . Breast cancer Other     Current Outpatient Medications (Endocrine & Metabolic):  .  levothyroxine (SYNTHROID, LEVOTHROID) 137 MCG tablet, TAKE 1 TABLET(137 MCG) BY MOUTH DAILY BEFORE BREAKFAST  Current Outpatient Medications (Cardiovascular):  Marland Kitchen  Pitavastatin Calcium (LIVALO) 1 MG TABS, Take 1 tablet (1 mg total) by mouth daily.  Current Outpatient Medications (Respiratory):  .  fluticasone (FLONASE) 50 MCG/ACT nasal spray, instill 2 sprays into each nostril once daily (Patient taking differently: Place 2 sprays into both nostrils daily as needed for allergies. )   Current Outpatient Medications (Hematological):  .  ferrous sulfate 325 (65 FE) MG EC tablet, Take 325 mg by mouth daily.  Current  Outpatient Medications (Other):  Marland Kitchen  Calcium Carb-Cholecalciferol (CALCIUM + D3 PO), Take 1 tablet by mouth daily. .  Magnesium 250 MG TABS, Take 250 mg by mouth daily.    Past medical history, social, surgical and family history all reviewed in electronic medical record.  [name] pertanent information unless stated regarding to the chief complaint.   Review of Systems:  [name] headache, visual changes, nausea, vomiting, diarrhea, constipation, dizziness, abdominal pain, skin rash, fevers, chills, night sweats, weight loss, swollen lymph nodes, body aches, joint swelling, muscle aches, chest pain, shortness of breath, mood changes.   Objective  Blood pressure 110/74, pulse 80, height 5\' 10"  (1.778 m), weight 197 lb (89.4 kg), SpO2 97 %.    General: [name] apparent distress alert and oriented x3 mood and affect normal, dressed appropriately.  HEENT: Pupils equal, extraocular movements intact  Respiratory: Patient's speak in full sentences and does not appear short of breath  Cardiovascular: [name] lower extremity edema, non tender, [name] erythema  Skin: Warm dry intact with [name] signs of infection or rash on extremities or on axial skeleton.  Abdomen: Soft nontender  Neuro: Cranial nerves II through XII are intact, neurovascularly intact in all extremities with 2+ DTRs and 2+ pulses.  Lymph: [name] lymphadenopathy of posterior or anterior cervical chain or axillae bilaterally.  Gait mild antalgic MSK:  tender with full range of motion and good stability and symmetric strength and tone of , elbows, , hip, knee and ankles bilaterally.  Multiple partial amputations of fingers noted.  Patient left second metatarsal heads arthritic changes noted again.  Mild decrease in range of motion lacking last 2 degrees of extension in the last 5 degrees of flexion.  Severely tender to palpation.  Right shoulder does have some mild decreased range of motion.  3+ out of 5 strength though noted.  Positive impingement.  Positive  O'Brien's. Contralateral shoulder unremarkable  Procedure: Real-time Ultrasound Guided Injection of right glenohumeral joint Device: GE Logiq Q7  Ultrasound guided injection is preferred based studies that show increased duration, increased effect, greater accuracy, decreased procedural pain, increased response rate with ultrasound guided versus blind injection.  Verbal informed consent obtained.  Time-out conducted.  Noted [name] overlying erythema, induration, or other signs of local infection.  Skin prepped in a sterile fashion.  Local anesthesia: Topical Ethyl chloride.  With sterile technique and under real time ultrasound guidance:  Joint visualized.  23g 1  inch needle inserted posterior approach. Pictures taken for needle placement. Patient did have injection of 2 cc of 1% lidocaine, 2 cc of 0.5% Marcaine, and 1.0 cc of Kenalog 40 mg/dL. Completed without difficulty  Pain immediately resolved suggesting accurate  placement of the medication.  Advised to call if fevers/chills, erythema, induration, drainage, or persistent bleeding.  Images permanently stored and available for review in the ultrasound unit.  Impression: Technically successful ultrasound guided injection.  Procedure: Real-time Ultrasound Guided Injection of third MCP left Device: GE Logiq Q7 Ultrasound guided injection is preferred based studies that show increased duration, increased effect, greater accuracy, decreased procedural pain, increased response rate, and decreased cost with ultrasound guided versus blind injection.  Verbal informed consent obtained.  Time-out conducted.  Noted [name] overlying erythema, induration, or other signs of local infection.  Skin prepped in a sterile fashion.  Local anesthesia: Topical Ethyl chloride.  With sterile technique and under real time ultrasound guidance: The 25-gauge half inch needle was injected with 0.5 cc of 0.5% Marcaine and 0.5 cc of Kenalog 40 mg/mL Completed without  difficulty  Pain immediately resolved suggesting accurate placement of the medication.  Advised to call if fevers/chills, erythema, induration, drainage, or persistent bleeding.  Images permanently stored and available for review in the ultrasound unit.  Impression: Technically successful ultrasound guided injection.   Impression and Recommendations:     This case required medical decision making of moderate complexity. The above documentation has been reviewed and is accurate and complete Keith Pulley, DO       Note: This dictation was prepared with Dragon dictation along with smaller phrase technology. Any transcriptional errors that result from this process are unintentional.

## 2018-02-15 ENCOUNTER — Ambulatory Visit: Payer: Self-pay

## 2018-02-15 ENCOUNTER — Ambulatory Visit: Payer: Medicare Other | Admitting: Family Medicine

## 2018-02-15 ENCOUNTER — Encounter: Payer: Self-pay | Admitting: Family Medicine

## 2018-02-15 VITALS — BP 110/74 | HR 80 | Ht 70.0 in | Wt 197.0 lb

## 2018-02-15 DIAGNOSIS — M25511 Pain in right shoulder: Secondary | ICD-10-CM

## 2018-02-15 DIAGNOSIS — G8929 Other chronic pain: Secondary | ICD-10-CM

## 2018-02-15 DIAGNOSIS — M12811 Other specific arthropathies, not elsewhere classified, right shoulder: Secondary | ICD-10-CM | POA: Diagnosis not present

## 2018-02-15 DIAGNOSIS — M1812 Unilateral primary osteoarthritis of first carpometacarpal joint, left hand: Secondary | ICD-10-CM | POA: Diagnosis not present

## 2018-02-15 DIAGNOSIS — M75101 Unspecified rotator cuff tear or rupture of right shoulder, not specified as traumatic: Secondary | ICD-10-CM

## 2018-02-15 DIAGNOSIS — M79645 Pain in left finger(s): Secondary | ICD-10-CM

## 2018-02-15 NOTE — Assessment & Plan Note (Signed)
Injected.  Tolerated procedure well.  Discussed icing regimen and home exercises.  Follow-up again in 4 to 8 weeks

## 2018-02-15 NOTE — Patient Instructions (Signed)
reat to see you  Happy New Year!  Injected the shoulder and the finger Glad the back is doing better  See me again in 10 weeks

## 2018-02-15 NOTE — Assessment & Plan Note (Signed)
Repeat injection given today.  Tolerated the procedure well.

## 2018-02-15 NOTE — Assessment & Plan Note (Signed)
Patient will follow-up with me at a later date for potential injection.  Been sometime since he has had an injection.

## 2018-03-02 ENCOUNTER — Other Ambulatory Visit: Payer: Self-pay | Admitting: Internal Medicine

## 2018-03-06 ENCOUNTER — Encounter: Payer: Self-pay | Admitting: Internal Medicine

## 2018-03-06 ENCOUNTER — Ambulatory Visit: Payer: Medicare Other | Admitting: Internal Medicine

## 2018-03-06 DIAGNOSIS — L723 Sebaceous cyst: Secondary | ICD-10-CM

## 2018-03-06 NOTE — Patient Instructions (Signed)
Your cyst was drained.  A referral was ordered for dermatology.    Call if you have pain or redness in the area of the cyst.

## 2018-03-06 NOTE — Assessment & Plan Note (Signed)
Mild soreness and swelling with minimal erythema Possibly the beginning of an infection With pressure the contents of the sebaceous cyst were able to be extracted We will hold off on antibiotics since there is no erythema and no obvious infection, but he will monitor closely and if he has any increasing pain, redness or other symptoms he will call and I would consider starting him on an antibiotic He is interested in having the cyst removed-we will refer to dermatology

## 2018-03-06 NOTE — Progress Notes (Signed)
Subjective:    Patient ID: Keith [name], male    DOB: [birth year], 78 y.o.   MRN: [id number]  HPI The patient is here for an acute visit.   Bump on back: he noticed it October.  It has been a little sore over the past 10 days.  It is difficult for him to see so is unsure if it is red.  He denies any fevers or chills.  He denies any obvious discharge or wetness on his shorts.  He is unsure how long it has been there.     Medications and allergies reviewed with patient and updated if appropriate.  Patient Active Problem List   Diagnosis Date Noted  . Finger pain, left 02/15/2018  . Routine general medical examination at a health care facility 12/07/2016  . Senile purpura ([place]) 01/14/2015  . Arthritis of sacroiliac joint 08/27/2014  . Renal cyst 05/11/2014  . Rotator cuff tear arthropathy of right shoulder 10/31/2013  . CMC arthritis, thumb, degenerative 07/24/2013  . Prostate nodule 12/15/2011  . Osteoarthritis of hip 10/06/2009  . Hypothyroidism 10/15/2008  . Hyperlipidemia with target LDL less than 130 03/03/2008  . ALLERGIC RHINITIS 03/03/2008    Current Outpatient Medications on File Prior to Visit  Medication Sig Dispense Refill  . Calcium Carb-Cholecalciferol (CALCIUM + D3 PO) Take 1 tablet by mouth daily.    . ferrous sulfate 325 (65 FE) MG EC tablet Take 325 mg by mouth daily.    . fluticasone (FLONASE) 50 MCG/ACT nasal spray INSTILL 2 SPRAYS INTO EACH NOSTRIL ONCE DAILY 16 g 11  . levothyroxine (SYNTHROID, LEVOTHROID) 137 MCG tablet TAKE 1 TABLET(137 MCG) BY MOUTH DAILY BEFORE BREAKFAST 90 tablet 1  . Magnesium 250 MG TABS Take 250 mg by mouth daily.    . Pitavastatin Calcium (LIVALO) 1 MG TABS Take 1 tablet (1 mg total) by mouth daily. 90 tablet 1   No current facility-administered medications on file prior to visit.     Past Medical History:  Diagnosis Date  . Allergic rhinitis   . Cancer ([place])    SKIN CANCER ON LIP (30 YR AGO)  . DDD (degenerative disc  disease), cervical   . Hyperlipidemia   . Hypothyroidism   . PONV (postoperative nausea and vomiting)    2016 BACK SURGERY, IN RECOVERY HE HAD N/V    Past Surgical History:  Procedure Laterality Date  . AMPUTATED Right 1961   TRAUMATIC AMPUTATED FINGERS RIGHT   . BACK SURGERY  2016  . TONSILLECTOMY    . TRANSFORAMINAL LUMBAR INTERBODY FUSION (TLIF) WITH PEDICLE SCREW FIXATION 1 LEVEL Right 11/29/2017   Procedure: RIGHT SIDED LUMBAR 4-5 TRANSFORAMINAL LUMBAR INTERBODY FUSION WITH INSTRUMENTATION AND ALLOGRAFT;  Surgeon: Phylliss Bob, MD;  Location: [facility];  Service: Orthopedics;  Laterality: Right;    Social History   Socioeconomic History  . Marital status: Married    Spouse name: Not on file  . Number of children: Not on file  . Years of education: Not on file  . Highest education level: Not on file  Occupational History  . 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 Occupation: Designer, industrial/product bus  Social Needs  . Financial resource strain: Not on file  . Food insecurity:    Worry: Not on file    Inability: Not on file  . Transportation needs:    Medical: Not on file    Non-medical: Not on file  Tobacco Use  . Smoking  status: Never Smoker  . Smokeless tobacco: Never Used  Substance and Sexual Activity  . Alcohol use: No  . Drug use: No  . Sexual activity: Not Currently  Lifestyle  . Physical activity:    Days per week: Not on file    Minutes per session: Not on file  . Stress: Not on file  Relationships  . Social connections:    Talks on phone: Not on file    Gets together: Not on file    Attends religious service: Not on file    Active member of club or organization: Not on file    Attends meetings of clubs or organizations: Not on file    Relationship status: Not on file  Other Topics Concern  . Not on file  Social History Narrative   Regular Exercise -  YES          Family History  Problem Relation Age of Onset  . Diabetes  Mother   . Coronary artery disease Father        Fatal MI  . Diabetes Father   . Coronary artery disease Other   . Breast cancer Other     Review of Systems Per HPI    Objective:   Vitals:   03/06/18 1502  BP: 110/64  Pulse: (!) 56  Resp: 16  Temp: 98.6 F (37 C)  SpO2: 97%   BP Readings from Last 3 Encounters:  03/06/18 110/64  02/15/18 110/74  01/01/18 120/80   Wt Readings from Last 3 Encounters:  03/06/18 196 lb (88.9 kg)  02/15/18 197 lb (89.4 kg)  01/01/18 194 lb 8 oz (88.2 kg)   Body mass index is 28.12 kg/m.   Physical Exam Constitutional:      Appearance: Normal appearance.  HENT:     Head: Normocephalic and atraumatic.  Skin:    General: Skin is warm and dry.     Comments: Sebaceous cyst center of back with mild surrounding swelling, minimal erythema if any.  With pressure a good amount of thickened white discharge was able to be extracted.  After several minutes of pressure this is decreased in size and the surrounding swelling improved.  At the very end a very small amount of serosanguineous fluid was discharge.  No incision was needed.  No active discharge at the end  Neurological:     Mental Status: He is alert.            Assessment & Plan:    See Problem List for Assessment and Plan of chronic medical problems.

## 2018-06-27 ENCOUNTER — Other Ambulatory Visit: Payer: Self-pay | Admitting: Internal Medicine

## 2018-06-27 DIAGNOSIS — E785 Hyperlipidemia, unspecified: Secondary | ICD-10-CM

## 2018-07-26 ENCOUNTER — Encounter: Payer: Self-pay | Admitting: Family Medicine

## 2018-07-26 ENCOUNTER — Ambulatory Visit: Payer: Self-pay

## 2018-07-26 ENCOUNTER — Other Ambulatory Visit: Payer: Self-pay

## 2018-07-26 ENCOUNTER — Ambulatory Visit: Payer: Medicare Other | Admitting: Family Medicine

## 2018-07-26 VITALS — BP 110/82 | HR 77 | Ht 70.0 in | Wt 194.0 lb

## 2018-07-26 DIAGNOSIS — S4292XA Fracture of left shoulder girdle, part unspecified, initial encounter for closed fracture: Secondary | ICD-10-CM

## 2018-07-26 DIAGNOSIS — M25512 Pain in left shoulder: Secondary | ICD-10-CM

## 2018-07-26 NOTE — Assessment & Plan Note (Signed)
Patient had more of a cellulitis his serum likely a small glenohumeral fracture.  Seems to be nondisplaced.  Concern for possible small partial tear of the rotator cuff as well.  Patient is going to start with home exercises and range of motion.  We discussed icing regimen.  We discussed which activities to do which wants to avoid.  Patient will do vitamin D and, topical anti-inflammatories.  See me again in 2 to 3 weeks for further evaluation.  Patient unable to do formal physical therapy with him being the primary caregiver for his wife who is ailing.  Spent  25 minutes with patient face-to-face and had greater than 50% of counseling including as described above in assessment and plan.

## 2018-07-26 NOTE — Progress Notes (Signed)
[name] [place], [place] [address] Phone: [phone] Subjective:   Keith [name], am serving as a scribe for Dr. Hulan [name].    CC: Left shoulder pain  [id number]  Keith [name] is a 78 y.o. male coming in with complaint of left shoulder pain. Thomson injury in which he jumped out of truck and landed on arms. Was unable to lift arm at the time. Had xrays. Is able to lift arm in office today. Has been using mm relaxor and heat. Pain is over the pec insertion and over supraspinatus. Pain is improving.     Past Medical History:  Diagnosis Date  . Allergic rhinitis   . Cancer ([place])    SKIN CANCER ON LIP (30 YR AGO)  . DDD (degenerative disc disease), cervical   . Hyperlipidemia   . Hypothyroidism   . PONV (postoperative nausea and vomiting)    2016 BACK SURGERY, IN RECOVERY HE HAD N/V   Past Surgical History:  Procedure Laterality Date  . AMPUTATED Right 1961   TRAUMATIC AMPUTATED FINGERS RIGHT   . BACK SURGERY  2016  . TONSILLECTOMY    . TRANSFORAMINAL LUMBAR INTERBODY FUSION (TLIF) WITH PEDICLE SCREW FIXATION 1 LEVEL Right 11/29/2017   Procedure: RIGHT SIDED LUMBAR 4-5 TRANSFORAMINAL LUMBAR INTERBODY FUSION WITH INSTRUMENTATION AND ALLOGRAFT;  Surgeon: Keith Bob, MD;  Location: [facility];  Service: Orthopedics;  Laterality: Right;   Social History   Socioeconomic History  . Marital status: Married    Spouse name: Not on file  . Number of children: Not on file  . Years of education: Not on file  . Highest education level: Not on file  Occupational History  . 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 Occupation: Designer, industrial/product bus  Social Needs  . Financial resource strain: Not on file  . Food insecurity    Worry: Not on file    Inability: Not on file  . Transportation needs    Medical: Not on file    Non-medical: Not on file  Tobacco Use  . Smoking status: Never Smoker  . Smokeless  tobacco: Never Used  Substance and Sexual Activity  . Alcohol use: [name]  . Drug use: [name]  . Sexual activity: Not Currently  Lifestyle  . Physical activity    Days per week: Not on file    Minutes per session: Not on file  . Stress: Not on file  Relationships  . Social Herbalist on phone: Not on file    Gets together: Not on file    Attends religious service: Not on file    Active member of club or organization: Not on file    Attends meetings of clubs or organizations: Not on file    Relationship status: Not on file  Other Topics Concern  . Not on file  Social History Narrative   Regular Exercise -  YES         Allergies  Allergen Reactions  . Atorvastatin Other (See Comments)    leg cramps  . Rosuvastatin Other (See Comments)    myalgias   Family History  Problem Relation Age of Onset  . Diabetes Mother   . Coronary artery disease Father        Fatal MI  . Diabetes Father   . Coronary artery disease Other   . Breast cancer Other     Current Outpatient Medications (Endocrine &  Metabolic):  .  levothyroxine (SYNTHROID, LEVOTHROID) 137 MCG tablet, TAKE 1 TABLET(137 MCG) BY MOUTH DAILY BEFORE BREAKFAST  Current Outpatient Medications (Cardiovascular):  Marland Kitchen  LIVALO 1 MG TABS, TAKE 1 TABLET(1 MG) BY MOUTH DAILY  Current Outpatient Medications (Respiratory):  .  fluticasone (FLONASE) 50 MCG/ACT nasal spray, INSTILL 2 SPRAYS INTO EACH NOSTRIL ONCE DAILY   Current Outpatient Medications (Hematological):  .  ferrous sulfate 325 (65 FE) MG EC tablet, Take 325 mg by mouth daily.  Current Outpatient Medications (Other):  Marland Kitchen  Calcium Carb-Cholecalciferol (CALCIUM + D3 PO), Take 1 tablet by mouth daily. .  Magnesium 250 MG TABS, Take 250 mg by mouth daily.    Past medical history, social, surgical and family history all reviewed in electronic medical record.  [name] pertanent information unless stated regarding to the chief complaint.   Review of Systems:  [name]  headache, visual changes, nausea, vomiting, diarrhea, constipation, dizziness, abdominal pain, skin rash, fevers, chills, night sweats, weight loss, swollen lymph nodes, body aches, joint swelling, muscle aches, chest pain, shortness of breath, mood changes.   Objective  There were [name] vitals taken for this visit. Systems examined below as of    General: [name] apparent distress alert and oriented x3 mood and affect normal, dressed appropriately.  HEENT: Pupils equal, extraocular movements intact  Respiratory: Patient's speak in full sentences and does not appear short of breath  Cardiovascular: [name] lower extremity edema, non tender, [name] erythema  Skin: Warm dry intact with [name] signs of infection or rash on extremities or on axial skeleton.  Abdomen: Soft nontender  Neuro: Cranial nerves II through XII are intact, neurovascularly intact in all extremities with 2+ DTRs and 2+ pulses.  Lymph: [name] lymphadenopathy of posterior or anterior cervical chain or axillae bilaterally.  Gait normal with good balance and coordination.  MSK:  Non tender with full range of motion and good stability and symmetric strength and tone of , elbows, wrist, hip, knee and ankles bilaterally.  Arthritic changes of multiple joints.  Patient also has significant amount of partial amputations of the fingers.  Left shoulder shows the patient does have some swelling over the shoulder and seems like.  [name] contusion.  Patient does have weakness of the rotator cuff with 3+ out of 5 strength.  Patient is in positive impingement noted.  Near full passive range of motion are noted.  Neurovascular intact distally.  Limited musculoskeletal ultrasound performed by Keith [name]  Limited ultrasound shows the patient does have a cortical irregularity over the superior lateral aspect of the humerus.  Patient does have a partial tear of the supraspinatus and subscapularis.  Patient has what appears to be a subdeltoid hematoma contributing to some  of the discomfort and pain as well. Impression: Questionable glenohumeral fracture nondisplaced, rotator cuff tear    Impression and Recommendations:     This case required medical decision making of moderate complexity. The above documentation has been reviewed and is accurate and complete Keith [name]       Note: This dictation was prepared with Dragon dictation along with smaller phrase technology. Any transcriptional errors that result from this process are unintentional.

## 2018-07-26 NOTE — Patient Instructions (Signed)
Ice 20 minutes each day Pennsaid 2x daily See me in 3 weeks

## 2018-08-16 ENCOUNTER — Encounter: Payer: Self-pay | Admitting: Family Medicine

## 2018-08-16 ENCOUNTER — Other Ambulatory Visit: Payer: Self-pay

## 2018-08-16 ENCOUNTER — Ambulatory Visit: Payer: Medicare Other | Admitting: Family Medicine

## 2018-08-16 ENCOUNTER — Ambulatory Visit: Payer: Self-pay

## 2018-08-16 VITALS — BP 104/70 | HR 70 | Ht 70.0 in | Wt 194.0 lb

## 2018-08-16 DIAGNOSIS — M25512 Pain in left shoulder: Secondary | ICD-10-CM

## 2018-08-16 DIAGNOSIS — S4292XD Fracture of left shoulder girdle, part unspecified, subsequent encounter for fracture with routine healing: Secondary | ICD-10-CM | POA: Diagnosis not present

## 2018-08-16 DIAGNOSIS — G8929 Other chronic pain: Secondary | ICD-10-CM

## 2018-08-16 NOTE — Progress Notes (Signed)
[name] [place], [place] [address] Phone: [phone] Subjective:   Keith [name], am serving as a scribe for Dr. Hulan Saas.  I'm seeing this patient by the request  of:    CC: left shoulder pain   [id number]   07/26/2018: Patient had more of a cellulitis his serum likely a small glenohumeral fracture.  Seems to be nondisplaced.  Concern for possible small partial tear of the rotator cuff as well.  Patient is going to start with home exercises and range of motion.  We discussed icing regimen.  We discussed which activities to do which wants to avoid.  Patient will do vitamin D and, topical anti-inflammatories.  See me again in 2 to 3 weeks for further evaluation.  Patient unable to do formal physical therapy with him being the primary caregiver for his wife who is ailing.  Spent  25 minutes with patient face-to-face and had greater than 50% of counseling including as described above in assessment and plan.  Update 08/16/2018: Keith [name] is a 78 y.o. male coming in with complaint of left shoulder pain. Was found to have a subluxation. Pain has improved but does notice pain with moving shoulder overhead over lateral aspect of shoulder and humerous. Is using tylenol as well as blue emu.  Patient states that he is feeling approximately 50 to 60% better.  Still has weakness noted.      Past Medical History:  Diagnosis Date  . Allergic rhinitis   . Cancer ([place])    SKIN CANCER ON LIP (30 YR AGO)  . DDD (degenerative disc disease), cervical   . Hyperlipidemia   . Hypothyroidism   . PONV (postoperative nausea and vomiting)    2016 BACK SURGERY, IN RECOVERY HE HAD N/V   Past Surgical History:  Procedure Laterality Date  . AMPUTATED Right 1961   TRAUMATIC AMPUTATED FINGERS RIGHT   . BACK SURGERY  2016  . TONSILLECTOMY    . TRANSFORAMINAL LUMBAR INTERBODY FUSION (TLIF) WITH PEDICLE SCREW FIXATION 1 LEVEL Right 11/29/2017    Procedure: RIGHT SIDED LUMBAR 4-5 TRANSFORAMINAL LUMBAR INTERBODY FUSION WITH INSTRUMENTATION AND ALLOGRAFT;  Surgeon: Phylliss Bob, MD;  Location: [facility];  Service: Orthopedics;  Laterality: Right;   Social History   Socioeconomic History  . Marital status: Married    Spouse name: Not on file  . Number of children: Not on file  . Years of education: Not on file  . Highest education level: Not on file  Occupational History  . 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 Occupation: Designer, industrial/product bus  Social Needs  . Financial resource strain: Not on file  . Food insecurity    Worry: Not on file    Inability: Not on file  . Transportation needs    Medical: Not on file    Non-medical: Not on file  Tobacco Use  . Smoking status: Never Smoker  . Smokeless tobacco: Never Used  Substance and Sexual Activity  . Alcohol use: [name]  . Drug use: [name]  . Sexual activity: Not Currently  Lifestyle  . Physical activity    Days per week: Not on file    Minutes per session: Not on file  . Stress: Not on file  Relationships  . Social Herbalist on phone: Not on file    Gets together: Not on file    Attends religious service: Not on file  Active member of club or organization: Not on file    Attends meetings of clubs or organizations: Not on file    Relationship status: Not on file  Other Topics Concern  . Not on file  Social History Narrative   Regular Exercise -  YES         Allergies  Allergen Reactions  . Atorvastatin Other (See Comments)    leg cramps  . Rosuvastatin Other (See Comments)    myalgias   Family History  Problem Relation Age of Onset  . Diabetes Mother   . Coronary artery disease Father        Fatal MI  . Diabetes Father   . Coronary artery disease Other   . Breast cancer Other     Current Outpatient Medications (Endocrine & Metabolic):  .  levothyroxine (SYNTHROID, LEVOTHROID) 137 MCG tablet, TAKE 1 TABLET(137 MCG) BY  MOUTH DAILY BEFORE BREAKFAST  Current Outpatient Medications (Cardiovascular):  Marland Kitchen  LIVALO 1 MG TABS, TAKE 1 TABLET(1 MG) BY MOUTH DAILY  Current Outpatient Medications (Respiratory):  .  fluticasone (FLONASE) 50 MCG/ACT nasal spray, INSTILL 2 SPRAYS INTO EACH NOSTRIL ONCE DAILY   Current Outpatient Medications (Hematological):  .  ferrous sulfate 325 (65 FE) MG EC tablet, Take 325 mg by mouth daily.  Current Outpatient Medications (Other):  Marland Kitchen  Calcium Carb-Cholecalciferol (CALCIUM + D3 PO), Take 1 tablet by mouth daily. .  Magnesium 250 MG TABS, Take 250 mg by mouth daily.    Past medical history, social, surgical and family history all reviewed in electronic medical record.  [name] pertanent information unless stated regarding to the chief complaint.   Review of Systems:  [name] headache, visual changes, nausea, vomiting, diarrhea, constipation, dizziness, abdominal pain, skin rash, fevers, chills, night sweats, weight loss, swollen lymph nodes, body aches, joint swelling,, chest pain, shortness of breath, mood changes.  Positive muscle aches  Objective  Blood pressure 104/70, pulse 70, height 5\' 10"  (1.778 m), weight 194 lb (88 kg), SpO2 97 %.    General: [name] apparent distress alert and oriented x3 mood and affect normal, dressed appropriately.  HEENT: Pupils equal, extraocular movements intact  Respiratory: Patient's speak in full sentences and does not appear short of breath  Cardiovascular: [name] lower extremity edema, non tender, [name] erythema  Skin: Warm dry intact with [name] signs of infection or rash on extremities or on axial skeleton.  Abdomen: Soft nontender  Neuro: Cranial nerves II through XII are intact, neurovascularly intact in all extremities with 2+ DTRs and 2+ pulses.  Lymph: [name] lymphadenopathy of posterior or anterior cervical chain or axillae bilaterally.  Gait normal with good balance and coordination.  MSK:  Non tender with full range of motion and good stability and  symmetric strength and tone of shoulders, elbows, wrist, hip, knee and ankles bilaterally.  Shoulder: Left Inspection reveals [name] abnormalities, atrophy or asymmetry. Palpation is normal with [name] tenderness over AC joint or bicipital groove. ROM is full in all planes. Rotator cuff strength 3 out of 5 compared to the contralateral side. Positive impingement Speeds and Yergason's tests normal. [name] labral pathology noted with negative Obrien's, negative clunk and good stability. Normal scapular function observed. [name] painful arc and [name] drop arm sign. [name] apprehension sign   Limited musculoskeletal ultrasound was performed and interpreted by Lyndal Pulley  Limited ultrasound shows the patient does have some soft callus over the area that seem to be more of a small fracture.  Patient's rotator cuff does  have a tear and there is a high-grade tear of the supraspinatus.  Mild retraction noted as well.  Impression and Recommendations:     This case required medical decision making of moderate complexity. The above documentation has been reviewed and is accurate and complete Lyndal Pulley, DO       Note: This dictation was prepared with Dragon dictation along with smaller phrase technology. Any transcriptional errors that result from this process are unintentional.

## 2018-08-16 NOTE — Patient Instructions (Signed)
Looking better Keep doing ice and exercises Follow up in 3-4 weeks and we will do injections

## 2018-08-16 NOTE — Assessment & Plan Note (Signed)
Patient is doing fairly well though with conservative therapy.  I do see a partial tear of the rotator cuff and is going to be somewhat concerning.  Patient has no weakness on exam today but we will continue to monitor.  Discussed icing regimen and home exercise.  Discussed which activities to do excellent stability.  Patient will increase activity slowly over the course the next several days.  Follow-up again in 3 weeks.

## 2018-09-06 ENCOUNTER — Ambulatory Visit (INDEPENDENT_AMBULATORY_CARE_PROVIDER_SITE_OTHER): Payer: Medicare Other | Admitting: Internal Medicine

## 2018-09-06 ENCOUNTER — Other Ambulatory Visit (INDEPENDENT_AMBULATORY_CARE_PROVIDER_SITE_OTHER): Payer: Medicare Other

## 2018-09-06 ENCOUNTER — Ambulatory Visit: Payer: Medicare Other | Admitting: Family Medicine

## 2018-09-06 ENCOUNTER — Encounter: Payer: Self-pay | Admitting: Family Medicine

## 2018-09-06 ENCOUNTER — Ambulatory Visit: Payer: Self-pay

## 2018-09-06 ENCOUNTER — Other Ambulatory Visit: Payer: Self-pay

## 2018-09-06 VITALS — Ht 70.0 in | Wt 194.0 lb

## 2018-09-06 VITALS — BP 128/88 | HR 70 | Temp 98.2°F | Resp 16 | Ht 70.0 in | Wt 194.8 lb

## 2018-09-06 DIAGNOSIS — G8929 Other chronic pain: Secondary | ICD-10-CM | POA: Diagnosis not present

## 2018-09-06 DIAGNOSIS — S46012D Strain of muscle(s) and tendon(s) of the rotator cuff of left shoulder, subsequent encounter: Secondary | ICD-10-CM | POA: Diagnosis not present

## 2018-09-06 DIAGNOSIS — M75102 Unspecified rotator cuff tear or rupture of left shoulder, not specified as traumatic: Secondary | ICD-10-CM | POA: Insufficient documentation

## 2018-09-06 DIAGNOSIS — E039 Hypothyroidism, unspecified: Secondary | ICD-10-CM

## 2018-09-06 DIAGNOSIS — M25512 Pain in left shoulder: Secondary | ICD-10-CM

## 2018-09-06 LAB — TSH: TSH: 16.44 u[IU]/mL — ABNORMAL HIGH (ref 0.35–4.50)

## 2018-09-06 MED ORDER — LEVOTHYROXINE SODIUM 137 MCG PO TABS: ORAL_TABLET | ORAL | 1 refills | Status: DC

## 2018-09-06 NOTE — Progress Notes (Signed)
[name] [place], [place] [address] Phone: [phone] Subjective:   Fontaine No, am serving as a scribe for Dr. Hulan Saas.    CC: Left shoulder pain follow-up  HDQ:[id number]   08/16/2018: Patient is doing fairly well though with conservative therapy.  I do see a partial tear of the rotator cuff and is going to be somewhat concerning.  Patient has no weakness on exam today but we will continue to monitor.  Discussed icing regimen and home exercise.  Discussed which activities to do excellent stability.  Patient will increase activity slowly over the course the next several days.  Follow-up again in 3 weeks.  Update 09/06/2018: Keith LOPEZPEREZ is a 78 y.o. male coming in with complaint of shoulder pain. Pain with overhead movements. Also has pain with holding weight in his hand and moving arm. Does feel some improvement though.      Past Medical History:  Diagnosis Date  . Allergic rhinitis   . Cancer ([place])    SKIN CANCER ON LIP (30 YR AGO)  . DDD (degenerative disc disease), cervical   . Hyperlipidemia   . Hypothyroidism   . PONV (postoperative nausea and vomiting)    2016 BACK SURGERY, IN RECOVERY HE HAD N/V   Past Surgical History:  Procedure Laterality Date  . AMPUTATED Right 1961   TRAUMATIC AMPUTATED FINGERS RIGHT   . BACK SURGERY  2016  . TONSILLECTOMY    . TRANSFORAMINAL LUMBAR INTERBODY FUSION (TLIF) WITH PEDICLE SCREW FIXATION 1 LEVEL Right 11/29/2017   Procedure: RIGHT SIDED LUMBAR 4-5 TRANSFORAMINAL LUMBAR INTERBODY FUSION WITH INSTRUMENTATION AND ALLOGRAFT;  Surgeon: Phylliss Bob, MD;  Location: [facility];  Service: Orthopedics;  Laterality: Right;   Social History   Socioeconomic History  . Marital status: Married    Spouse name: Not on file  . Number of children: Not on file  . Years of education: Not on file  . Highest education level: Not on file  Occupational History  . 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 Occupation: Designer, industrial/product bus  Social Needs  . Financial resource strain: Not on file  . Food insecurity    Worry: Not on file    Inability: Not on file  . Transportation needs    Medical: Not on file    Non-medical: Not on file  Tobacco Use  . Smoking status: Never Smoker  . Smokeless tobacco: Never Used  Substance and Sexual Activity  . Alcohol use: No  . Drug use: No  . Sexual activity: Not Currently  Lifestyle  . Physical activity    Days per week: Not on file    Minutes per session: Not on file  . Stress: Not on file  Relationships  . Social Herbalist on phone: Not on file    Gets together: Not on file    Attends religious service: Not on file    Active member of club or organization: Not on file    Attends meetings of clubs or organizations: Not on file    Relationship status: Not on file  Other Topics Concern  . Not on file  Social History Narrative   Regular Exercise -  YES         Allergies  Allergen Reactions  . Atorvastatin Other (See Comments)    leg cramps  . Rosuvastatin Other (See Comments)    myalgias   Family History  Problem Relation Age of Onset  . Diabetes Mother   . Coronary artery disease Father        Fatal MI  . Diabetes Father   . Coronary artery disease Other   . Breast cancer Other     Current Outpatient Medications (Endocrine & Metabolic):  .  levothyroxine (SYNTHROID, LEVOTHROID) 137 MCG tablet, TAKE 1 TABLET(137 MCG) BY MOUTH DAILY BEFORE BREAKFAST  Current Outpatient Medications (Cardiovascular):  Marland Kitchen  LIVALO 1 MG TABS, TAKE 1 TABLET(1 MG) BY MOUTH DAILY  Current Outpatient Medications (Respiratory):  .  fluticasone (FLONASE) 50 MCG/ACT nasal spray, INSTILL 2 SPRAYS INTO EACH NOSTRIL ONCE DAILY   Current Outpatient Medications (Hematological):  .  ferrous sulfate 325 (65 FE) MG EC tablet, Take 325 mg by mouth daily.  Current Outpatient Medications (Other):  Marland Kitchen  Calcium  Carb-Cholecalciferol (CALCIUM + D3 PO), Take 1 tablet by mouth daily. .  Magnesium 250 MG TABS, Take 250 mg by mouth daily.    Past medical history, social, surgical and family history all reviewed in electronic medical record.  No pertanent information unless stated regarding to the chief complaint.   Review of Systems:  No headache, visual changes, nausea, vomiting, diarrhea, constipation, dizziness, abdominal pain, skin rash, fevers, chills, night sweats, weight loss, swollen lymph nodes, body aches, joint swelling, , chest pain, shortness of breath, mood changes.  Positive muscle aches  Objective  Height 5\' 10"  (1.778 m), weight 194 lb (88 kg). S   General: No apparent distress alert and oriented x3 mood and affect normal, dressed appropriately.  HEENT: Pupils equal, extraocular movements intact  Respiratory: Patient's speak in full sentences and does not appear short of breath  Cardiovascular: No lower extremity edema, non tender, no erythema  Skin: Warm dry intact with no signs of infection or rash on extremities or on axial skeleton.  Abdomen: Soft nontender  Neuro: Cranial nerves II through XII are intact, neurovascularly intact in all extremities with 2+ DTRs and 2+ pulses.  Lymph: No lymphadenopathy of posterior or anterior cervical chain or axillae bilaterally.  Gait normal with good balance and coordination.  MSK:  Non tender with full range of motion and good stability and symmetric strength and tone of , elbows, wrist, hip, knee and ankles bilaterally.  Patient has multiple loss digits Left shoulder exam shows a 4-5 strength of the rotator cuff.  Mild improvement in range of motion but still has some difficulty with even raising above 95 degrees actively.  Positive impingement sign noted. Contralateral shoulder does have rotator cuff arthropathy with weakness noted  Procedure: Real-time Ultrasound Guided Injection of left glenohumeral joint Device: GE Logiq E  Ultrasound  guided injection is preferred based studies that show increased duration, increased effect, greater accuracy, decreased procedural pain, increased response rate with ultrasound guided versus blind injection.  Verbal informed consent obtained.  Time-out conducted.  Noted no overlying erythema, induration, or other signs of local infection.  Skin prepped in a sterile fashion.  Local anesthesia: Topical Ethyl chloride.  With sterile technique and under real time ultrasound guidance:  Joint visualized.  21g 2 inch needle inserted posterior approach. Pictures taken for needle placement. Patient did have injection of 2 cc of 0.5% Marcaine, and 1cc of Kenalog 40 mg/dL. Completed without difficulty  Pain immediately resolved suggesting accurate placement of the medication.  Advised to call if fevers/chills, erythema, induration, drainage, or persistent bleeding.  Images permanently stored and available for review in the ultrasound unit.  Impression: Technically successful  ultrasound guided injection.    Impression and Recommendations:     This case required medical decision making of moderate complexity. The above documentation has been reviewed and is accurate and complete Lyndal Pulley, DO       Note: This dictation was prepared with Dragon dictation along with smaller phrase technology. Any transcriptional errors that result from this process are unintentional.

## 2018-09-06 NOTE — Patient Instructions (Addendum)
Injected shoulder today Keep hands in peripheral vision See me again in 4-5 weeks

## 2018-09-06 NOTE — Assessment & Plan Note (Signed)
Patient given injection today.  Tolerated the procedure well.  We discussed icing regimen and home exercise.  Discussed which activities to do which will still avoid.  Patient is to increase activity slowly over the course the next little days.  Follow-up again in 4 weeks- 8 weeks

## 2018-09-06 NOTE — Progress Notes (Signed)
Subjective:  Patient ID: Keith [name], male    DOB: April 20, 1940  Age: 78 y.o. MRN: [id number]  CC: Hypothyroidism   HPI Keith [name] presents for f/up - He offers no complaints today.  According to prescription refills he would have run out of his thyroid supplement about 2 months ago.  Outpatient Medications Prior to Visit  Medication Sig Dispense Refill  . Calcium Carb-Cholecalciferol (CALCIUM + D3 PO) Take 1 tablet by mouth daily.    . ferrous sulfate 325 (65 FE) MG EC tablet Take 325 mg by mouth daily.    . fluticasone (FLONASE) 50 MCG/ACT nasal spray INSTILL 2 SPRAYS INTO EACH NOSTRIL ONCE DAILY 16 g 11  . levothyroxine (SYNTHROID, LEVOTHROID) 137 MCG tablet TAKE 1 TABLET(137 MCG) BY MOUTH DAILY BEFORE BREAKFAST 90 tablet 1  . LIVALO 1 MG TABS TAKE 1 TABLET(1 MG) BY MOUTH DAILY 90 tablet 1  . Magnesium 250 MG TABS Take 250 mg by mouth daily.     No facility-administered medications prior to visit.     ROS Review of Systems  Constitutional: Negative for diaphoresis, fatigue and unexpected weight change.  HENT: Negative.   Eyes: Negative for visual disturbance.  Respiratory: Negative for cough, chest tightness, shortness of breath and wheezing.   Cardiovascular: Negative for chest pain, palpitations and leg swelling.  Gastrointestinal: Negative for abdominal pain, constipation, diarrhea, nausea and vomiting.  Endocrine: Negative for cold intolerance.  Genitourinary: Negative.  Negative for difficulty urinating.  Musculoskeletal: Negative.  Negative for arthralgias and myalgias.  Skin: Negative.  Negative for color change.  Neurological: Negative for dizziness, weakness and light-headedness.  Hematological: Negative for adenopathy. Does not bruise/bleed easily.  Psychiatric/Behavioral: Negative.     Objective:  BP 128/88 (BP Location: Left Arm, Patient Position: Sitting, Cuff Size: Normal)   Pulse 70   Temp 98.2 F (36.8 C) (Oral)   Ht 5\' 10"  (1.778 m)   Wt 194 lb  12 oz (88.3 kg)   SpO2 97%   BMI 27.94 kg/m   BP Readings from Last 3 Encounters:  09/06/18 128/88  08/16/18 104/70  07/26/18 110/82    Wt Readings from Last 3 Encounters:  09/06/18 194 lb (88 kg)  09/06/18 194 lb 12 oz (88.3 kg)  08/16/18 194 lb (88 kg)    Physical Exam Vitals signs reviewed.  Constitutional:      Appearance: He is not ill-appearing or diaphoretic.  HENT:     Nose: Nose normal.     Mouth/Throat:     Mouth: Mucous membranes are moist.  Eyes:     General: No scleral icterus.    Conjunctiva/sclera: Conjunctivae normal.  Neck:     Musculoskeletal: Normal range of motion. No neck rigidity or muscular tenderness.  Cardiovascular:     Rate and Rhythm: Normal rate and regular rhythm.     Heart sounds: No murmur.  Pulmonary:     Effort: Pulmonary effort is normal. No respiratory distress.     Breath sounds: No stridor. No wheezing, rhonchi or rales.  Abdominal:     General: Abdomen is flat. Bowel sounds are normal. There is no distension.     Palpations: There is no hepatomegaly or splenomegaly.     Tenderness: There is no abdominal tenderness.  Musculoskeletal: Normal range of motion.     Right lower leg: No edema.     Left lower leg: No edema.  Lymphadenopathy:     Cervical: No cervical adenopathy.  Skin:    General: Skin  is warm and dry.     Coloration: Skin is not pale.  Neurological:     General: No focal deficit present.     Mental Status: He is alert and oriented to person, place, and time. Mental status is at baseline.  Psychiatric:        Mood and Affect: Mood normal.        Behavior: Behavior normal.     Lab Results  Component Value Date   WBC 9.2 01/01/2018   HGB 14.8 01/01/2018   HCT 44.4 01/01/2018   PLT 225.0 01/01/2018   GLUCOSE 102 (H) 01/01/2018   CHOL 167 01/01/2018   TRIG 165.0 (H) 01/01/2018   HDL 32.30 (L) 01/01/2018   LDLCALC 102 (H) 01/01/2018   ALT 11 01/01/2018   AST 14 01/01/2018   NA 137 01/01/2018   K 4.3  01/01/2018   CL 101 01/01/2018   CREATININE 0.92 01/01/2018   BUN 11 01/01/2018   CO2 27 01/01/2018   TSH 16.44 (H) 09/06/2018   PSA 3.35 01/01/2018   INR 1.01 11/27/2017   HGBA1C 5.9 01/01/2018    No results found.  Assessment & Plan:   [place] was seen today for hypothyroidism.  Diagnoses and all orders for this visit:  Acquired hypothyroidism- His TSH is elevated at 16.44.  He is obviously not compliant with thyroid replacement therapy.  I have asked him to restart his previous dose of levothyroxine and to return in 2 to 3 months to recheck his TSH level. -     TSH; Future   I am having Keith [name] maintain his ferrous sulfate, Calcium Carb-Cholecalciferol (CALCIUM + D3 PO), Magnesium, levothyroxine, fluticasone, and Livalo.  No orders of the defined types were placed in this encounter.    Follow-up: No follow-ups on file.  Scarlette Calico, MD

## 2018-09-07 ENCOUNTER — Encounter: Payer: Self-pay | Admitting: Internal Medicine

## 2018-09-11 ENCOUNTER — Telehealth: Payer: Self-pay | Admitting: Internal Medicine

## 2018-09-11 NOTE — Telephone Encounter (Signed)
Contacted pt. He has been scheduled for 2 month TSH follow up with PCP.

## 2018-09-11 NOTE — Telephone Encounter (Signed)
Copied from [place] [id number]. Topic: Quick Communication - See Telephone Encounter >> Sep 11, 2018  9:42 AM Nils Flack wrote: CRM for notification. See Telephone encounter for: 09/11/18. Pt is asking for a call back from [place] regarding labs, please call [phone]

## 2018-09-11 NOTE — Telephone Encounter (Signed)
Pt returning your call

## 2018-09-11 NOTE — Telephone Encounter (Signed)
lvm for pt call back at his convenience.

## 2018-10-11 ENCOUNTER — Encounter: Payer: Self-pay | Admitting: Family Medicine

## 2018-10-11 ENCOUNTER — Ambulatory Visit: Payer: Self-pay

## 2018-10-11 ENCOUNTER — Other Ambulatory Visit: Payer: Self-pay

## 2018-10-11 ENCOUNTER — Ambulatory Visit: Payer: Medicare Other | Admitting: Family Medicine

## 2018-10-11 VITALS — BP 102/66 | HR 67 | Ht 70.0 in | Wt 193.0 lb

## 2018-10-11 DIAGNOSIS — M19012 Primary osteoarthritis, left shoulder: Secondary | ICD-10-CM

## 2018-10-11 DIAGNOSIS — G8929 Other chronic pain: Secondary | ICD-10-CM | POA: Diagnosis not present

## 2018-10-11 DIAGNOSIS — M19019 Primary osteoarthritis, unspecified shoulder: Secondary | ICD-10-CM | POA: Insufficient documentation

## 2018-10-11 DIAGNOSIS — M25512 Pain in left shoulder: Secondary | ICD-10-CM

## 2018-10-11 NOTE — Progress Notes (Signed)
[name] [address], [place] [address] Phone: [phone] Subjective:   Keith [name], am serving as a scribe for Dr. Hulan Saas.   CC: Left shoulder pain  [id number]   09/06/2018 Patient given injection today.  Tolerated the procedure well.  We discussed icing regimen and home exercise.  Discussed which activities to do which will still avoid.  Patient is to increase activity slowly over the course the next little days.  Follow-up again in 4 weeks- 8 weeks    Update 10/11/2018 Keith [name] is a 78 y.o. male coming in with complain of left shoulder pain. Patient states that his pain has not changed since last visit. Hurts to turn over in bed. Pain is achy. Pain with moving a drink towards his mouth.     Injection given in the left shoulder on September 06, 2018  Past Medical History:  Diagnosis Date  . Allergic rhinitis   . Cancer ([place])    SKIN CANCER ON LIP (30 YR AGO)  . DDD (degenerative disc disease), cervical   . Hyperlipidemia   . Hypothyroidism   . PONV (postoperative nausea and vomiting)    2016 BACK SURGERY, IN RECOVERY HE HAD N/V   Past Surgical History:  Procedure Laterality Date  . AMPUTATED Right 1961   TRAUMATIC AMPUTATED FINGERS RIGHT   . BACK SURGERY  2016  . TONSILLECTOMY    . TRANSFORAMINAL LUMBAR INTERBODY FUSION (TLIF) WITH PEDICLE SCREW FIXATION 1 LEVEL Right 11/29/2017   Procedure: RIGHT SIDED LUMBAR 4-5 TRANSFORAMINAL LUMBAR INTERBODY FUSION WITH INSTRUMENTATION AND ALLOGRAFT;  Surgeon: Phylliss Bob, MD;  Location: [facility];  Service: Orthopedics;  Laterality: Right;   Social History   Socioeconomic History  . Marital status: Married    Spouse name: Not on file  . Number of children: Not on file  . Years of education: Not on file  . Highest education level: Not on file  Occupational History  . 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 Occupation: Designer, industrial/product bus  Social  Needs  . Financial resource strain: Not on file  . Food insecurity    Worry: Not on file    Inability: Not on file  . Transportation needs    Medical: Not on file    Non-medical: Not on file  Tobacco Use  . Smoking status: Never Smoker  . Smokeless tobacco: Never Used  Substance and Sexual Activity  . Alcohol use: [name]  . Drug use: [name]  . Sexual activity: Not Currently  Lifestyle  . Physical activity    Days per week: Not on file    Minutes per session: Not on file  . Stress: Not on file  Relationships  . Social Herbalist on phone: Not on file    Gets together: Not on file    Attends religious service: Not on file    Active member of club or organization: Not on file    Attends meetings of clubs or organizations: Not on file    Relationship status: Not on file  Other Topics Concern  . Not on file  Social History Narrative   Regular Exercise -  YES         Allergies  Allergen Reactions  . Atorvastatin Other (See Comments)    leg cramps  . Rosuvastatin Other (See Comments)    myalgias   Family History  Problem Relation Age of Onset  .  Diabetes Mother   . Coronary artery disease Father        Fatal MI  . Diabetes Father   . Coronary artery disease Other   . Breast cancer Other     Current Outpatient Medications (Endocrine & Metabolic):  .  levothyroxine (SYNTHROID) 137 MCG tablet, TAKE 1 TABLET(137 MCG) BY MOUTH DAILY BEFORE BREAKFAST  Current Outpatient Medications (Cardiovascular):  Marland Kitchen  LIVALO 1 MG TABS, TAKE 1 TABLET(1 MG) BY MOUTH DAILY  Current Outpatient Medications (Respiratory):  .  fluticasone (FLONASE) 50 MCG/ACT nasal spray, INSTILL 2 SPRAYS INTO EACH NOSTRIL ONCE DAILY   Current Outpatient Medications (Hematological):  .  ferrous sulfate 325 (65 FE) MG EC tablet, Take 325 mg by mouth daily.  Current Outpatient Medications (Other):  Marland Kitchen  Calcium Carb-Cholecalciferol (CALCIUM + D3 PO), Take 1 tablet by mouth daily. .  Magnesium 250 MG  TABS, Take 250 mg by mouth daily.    Past medical history, social, surgical and family history all reviewed in electronic medical record.  [name] pertanent information unless stated regarding to the chief complaint.   Review of Systems:  [name] headache, visual changes, nausea, vomiting, diarrhea, constipation, dizziness, abdominal pain, skin rash, fevers, chills, night sweats, weight loss, swollen lymph nodes, body aches, joint swelling,  chest pain, shortness of breath, mood changes.  Positive muscle aches  Objective  Blood pressure 102/66, pulse 67, height 5\' 10"  (1.778 m), weight 193 lb (87.5 kg), SpO2 99 %.    General: [name] apparent distress alert and oriented x3 mood and affect normal, dressed appropriately.  HEENT: Pupils equal, extraocular movements intact  Respiratory: Patient's speak in full sentences and does not appear short of breath  Cardiovascular: [name] lower extremity edema, non tender, [name] erythema  Skin: Warm dry intact with [name] signs of infection or rash on extremities or on axial skeleton.  Abdomen: Soft nontender  Neuro: Cranial nerves II through XII are intact, neurovascularly intact in all extremities with 2+ DTRs and 2+ pulses.  Lymph: [name] lymphadenopathy of posterior or anterior cervical chain or axillae bilaterally.  Gait mild antalgic MSK:  tender with full range of motion and good stability and symmetric strength and tone of  elbows, wrist, hip, knee and ankles bilaterally.  Mild to moderate arthritic changes of multiple joints.  Patient has partial amputation of multiple fingers.  Left shoulder with positive impingement.  4-5 strength of the rotator cuff but near symmetric to the contralateral side.  Patient has positive crossover.  Procedure: Real-time Ultrasound Guided Injection of left acromioclavicular joint Device: GE Logiq Q7 Ultrasound guided injection is preferred based studies that show increased duration, increased effect, greater accuracy, decreased procedural  pain, increased response rate, and decreased cost with ultrasound guided versus blind injection.  Verbal informed consent obtained.  Time-out conducted.  Noted [name] overlying erythema, induration, or other signs of local infection.  Skin prepped in a sterile fashion.  Local anesthesia: Topical Ethyl chloride.  With sterile technique and under real time ultrasound guidance: With a 25-gauge half inch needle injected with 0.5 cc of 0.5% Marcaine and 0.5 cc of Kenalog 40 mg/mL Completed without difficulty  Pain immediately resolved suggesting accurate placement of the medication.  Advised to call if fevers/chills, erythema, induration, drainage, or persistent bleeding.  Images permanently stored and available for review in the ultrasound unit.  Impression: Technically successful ultrasound guided injection.    Impression and Recommendations:     This case required medical decision making of moderate complexity. The above documentation  has been reviewed and is accurate and complete Kareli Hossain M Jeramia Saleeby, DO       Note: This dictation was prepared with Dragon dictation along with smaller phrase technology. Any transcriptional errors that result from this process are unintentional.         

## 2018-10-11 NOTE — Assessment & Plan Note (Signed)
Patient given injection today.  Tolerated the procedure well.  Discussed icing regimen and home exercises, which activities to do which wants to avoid.  I am hoping the patient will continue to improve over the course the next several days.  Follow-up again in 4 to 6 weeks

## 2018-10-28 IMAGING — DX DG HIP (WITH OR WITHOUT PELVIS) 2-3V*R*
3 series · 3 of 3 positions shown · non-contrast
Comparison: March 12, 2015

CLINICAL DATA: Right hip pain for 2 weeks.

EXAM:
DG HIP (WITH OR WITHOUT PELVIS) 2-3V RIGHT

[pelvis ap]
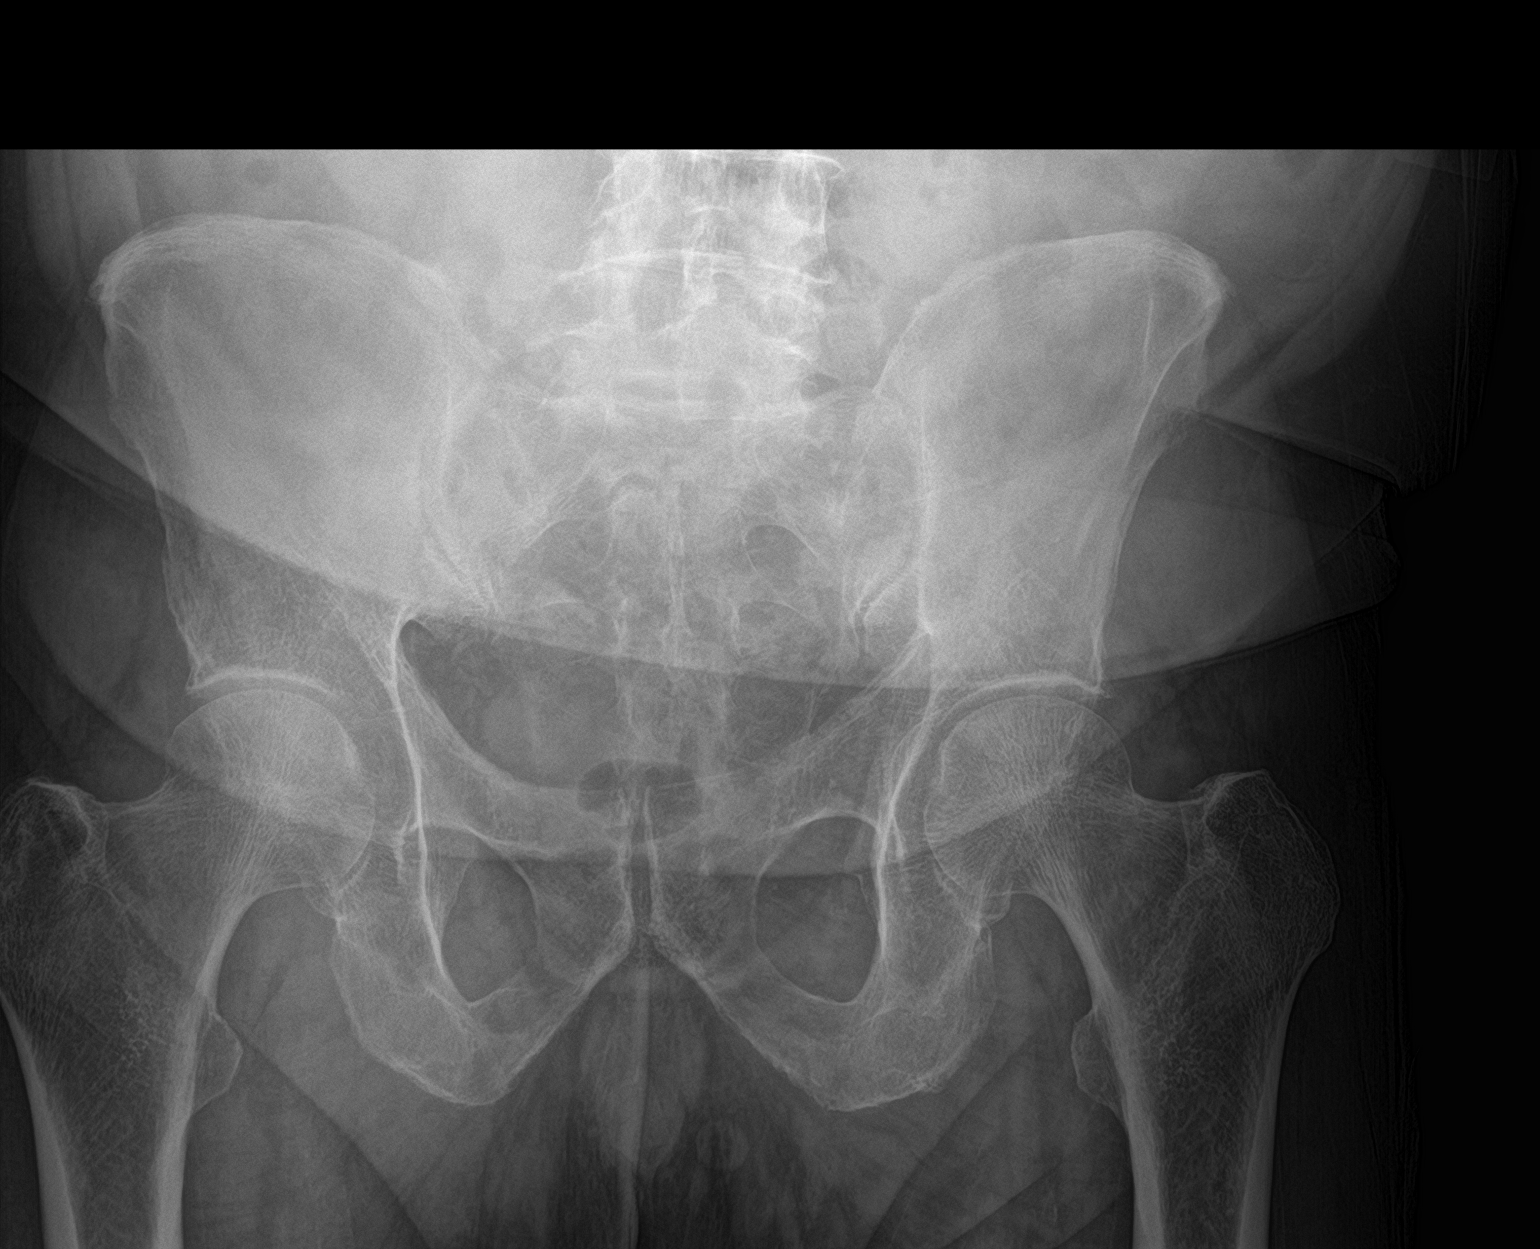

[hip ap]
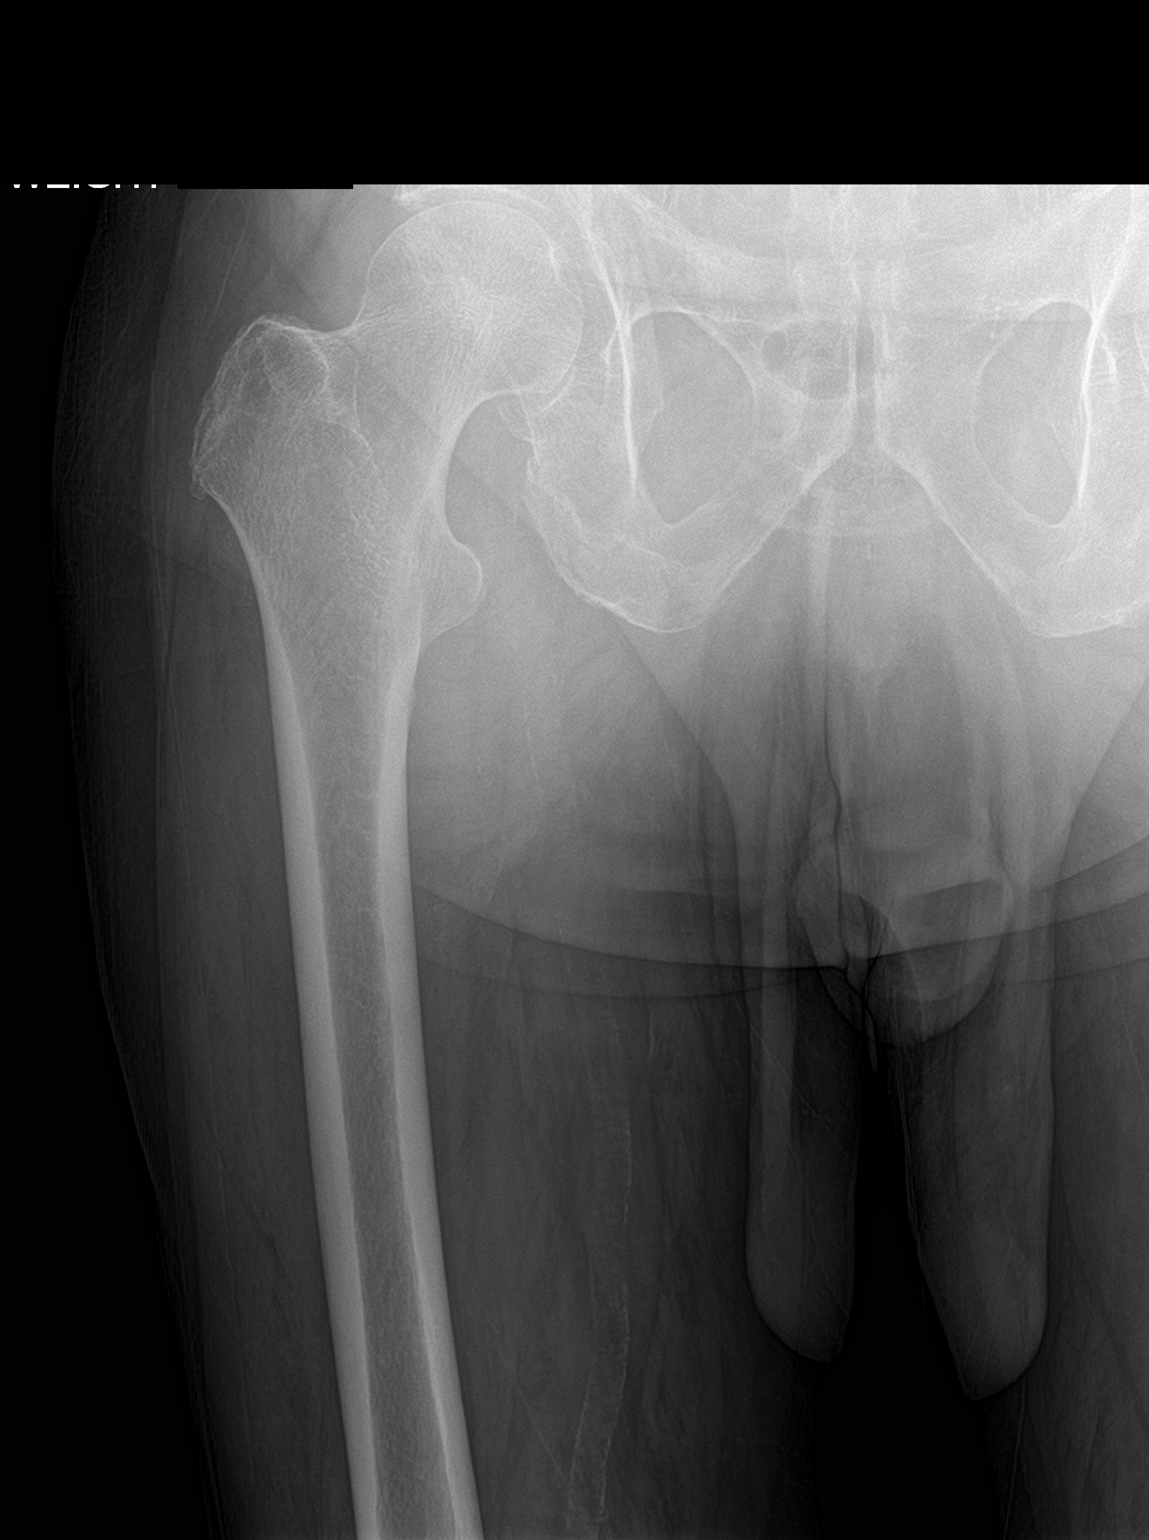

[hip lat]
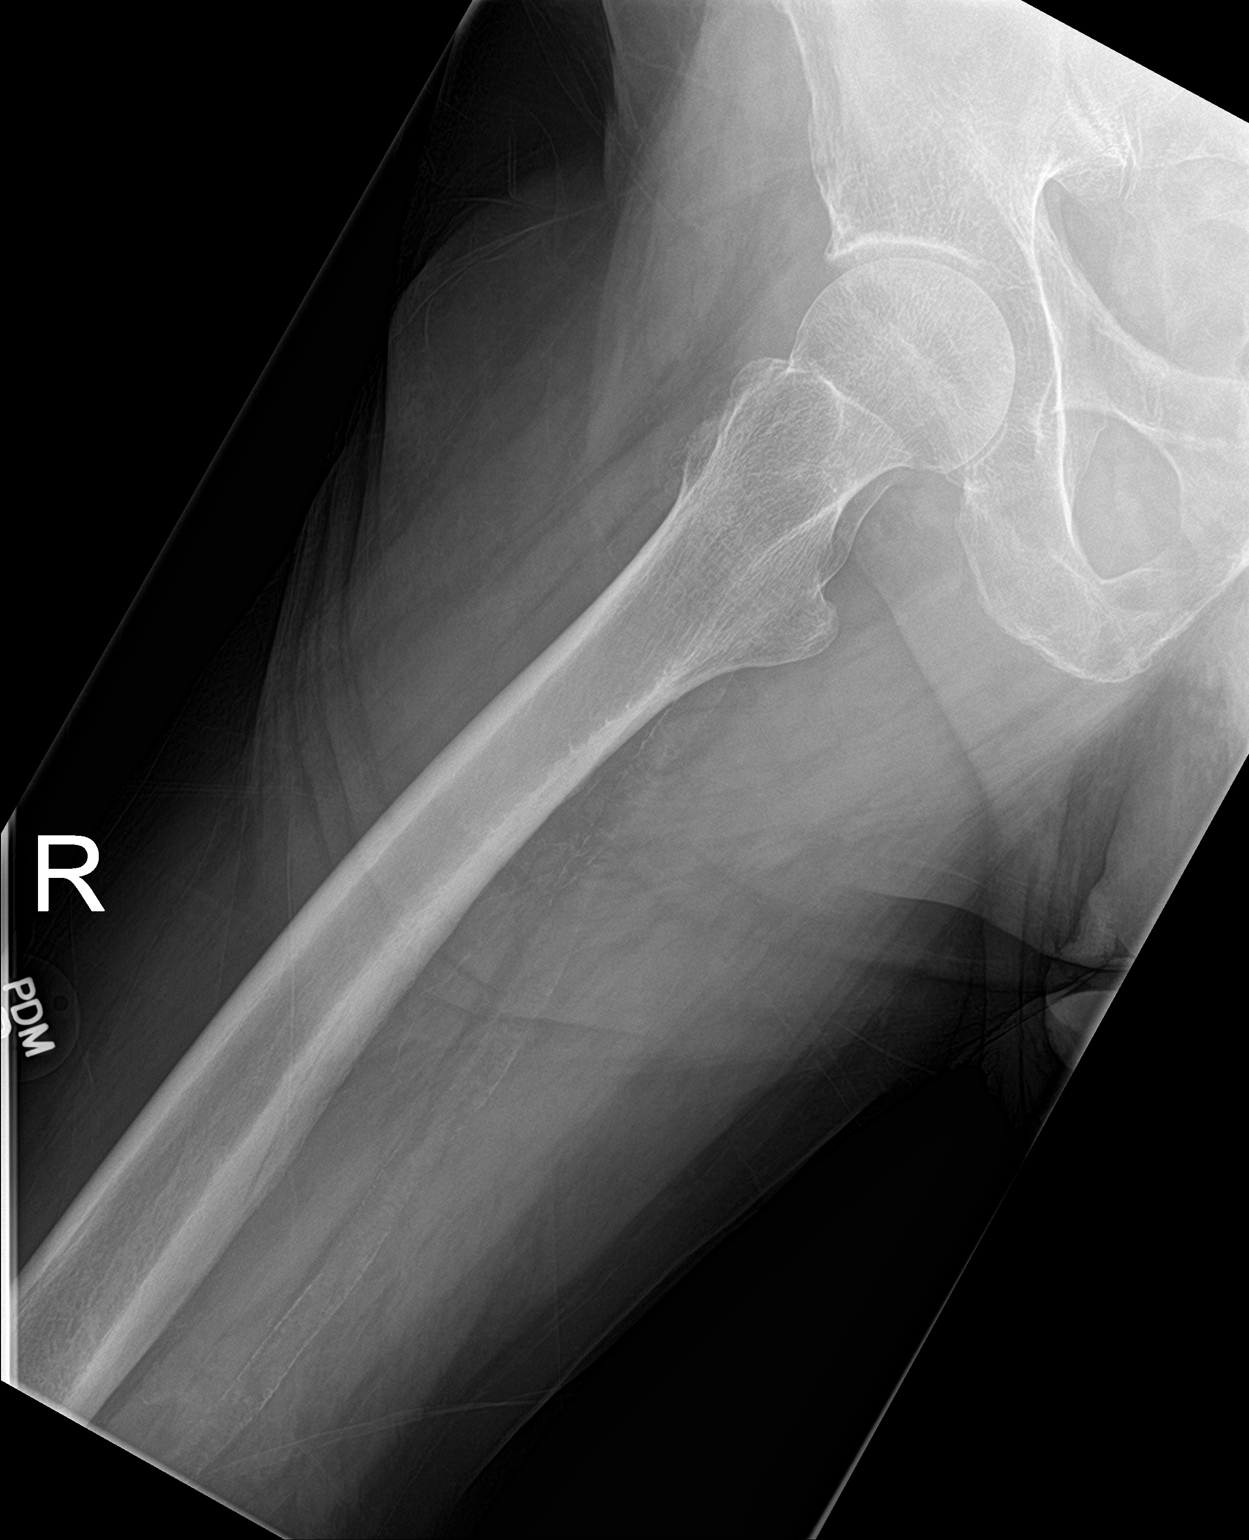

[3 of 3 positions shown; findings below may reference images not displayed]

FINDINGS: Vascular calcifications identified in the visualized left upper leg.
No significant degenerative changes in the hips. The articular
spaces are preserved. Probable degenerative changes in lower lumbar
spine, incompletely evaluated. No other acute abnormality.
IMPRESSION: 1. No definitive cause for the patient's right hip pain identified.

## 2018-11-05 ENCOUNTER — Encounter: Payer: Self-pay | Admitting: Internal Medicine

## 2018-11-05 ENCOUNTER — Other Ambulatory Visit (INDEPENDENT_AMBULATORY_CARE_PROVIDER_SITE_OTHER): Payer: Medicare Other

## 2018-11-05 ENCOUNTER — Other Ambulatory Visit: Payer: Self-pay

## 2018-11-05 ENCOUNTER — Ambulatory Visit (INDEPENDENT_AMBULATORY_CARE_PROVIDER_SITE_OTHER): Payer: Medicare Other | Admitting: Internal Medicine

## 2018-11-05 VITALS — BP 120/80 | HR 72 | Temp 98.5°F | Ht 70.0 in | Wt 195.0 lb

## 2018-11-05 DIAGNOSIS — D692 Other nonthrombocytopenic purpura: Secondary | ICD-10-CM

## 2018-11-05 DIAGNOSIS — Z0000 Encounter for general adult medical examination without abnormal findings: Secondary | ICD-10-CM | POA: Diagnosis not present

## 2018-11-05 DIAGNOSIS — Z23 Encounter for immunization: Secondary | ICD-10-CM | POA: Diagnosis not present

## 2018-11-05 DIAGNOSIS — E039 Hypothyroidism, unspecified: Secondary | ICD-10-CM

## 2018-11-05 DIAGNOSIS — E785 Hyperlipidemia, unspecified: Secondary | ICD-10-CM

## 2018-11-05 DIAGNOSIS — N402 Nodular prostate without lower urinary tract symptoms: Secondary | ICD-10-CM

## 2018-11-05 LAB — BASIC METABOLIC PANEL
BUN: 17 mg/dL (ref 6–23)
CO2: 27 mEq/L (ref 19–32)
Calcium: 9 mg/dL (ref 8.4–10.5)
Chloride: 106 mEq/L (ref 96–112)
Creatinine, Ser: 1.09 mg/dL (ref 0.40–1.50)
GFR: 65.58 mL/min (ref >60.00)
Glucose, Bld: 94 mg/dL (ref 70–99)
Potassium: 4.5 mEq/L (ref 3.5–5.1)
Sodium: 141 mEq/L (ref 135–145)

## 2018-11-05 LAB — CBC WITH DIFFERENTIAL/PLATELET
Basophils Absolute: 0.1 10*3/uL (ref 0.0–0.1)
Basophils Relative: 1 % (ref 0.0–3.0)
Eosinophils Absolute: 0.2 10*3/uL (ref 0.0–0.7)
Eosinophils Relative: 1.8 % (ref 0.0–5.0)
HCT: 46.9 % (ref 39.0–52.0)
Hemoglobin: 15.5 g/dL (ref 13.0–17.0)
Lymphocytes Relative: 21.4 % (ref 12.0–46.0)
Lymphs Abs: 2 10*3/uL (ref 0.7–4.0)
MCHC: 33.2 g/dL (ref 30.0–36.0)
MCV: 93.1 fl (ref 78.0–100.0)
Monocytes Absolute: 0.8 10*3/uL (ref 0.1–1.0)
Monocytes Relative: 8.8 % (ref 3.0–12.0)
Neutro Abs: 6.3 10*3/uL (ref 1.4–7.7)
Neutrophils Relative %: 67 % (ref 43.0–77.0)
Platelets: 200 10*3/uL (ref 150.0–400.0)
RBC: 5.04 Mil/uL (ref 4.22–5.81)
RDW: 14.3 % (ref 11.5–15.5)
WBC: 9.3 10*3/uL (ref 4.0–10.5)

## 2018-11-05 LAB — PSA: PSA: 3.66 ng/mL (ref 0.10–4.00)

## 2018-11-05 LAB — LIPID PANEL
Cholesterol: 158 mg/dL (ref 0–200)
HDL: 43 mg/dL (ref >39.00)
LDL Cholesterol: 86 mg/dL (ref 0–99)
NonHDL: 115.44
Total CHOL/HDL Ratio: 4
Triglycerides: 147 mg/dL (ref 0.0–149.0)
VLDL: 29.4 mg/dL (ref 0.0–40.0)

## 2018-11-05 LAB — TSH: TSH: 21.22 u[IU]/mL — ABNORMAL HIGH (ref 0.35–4.50)

## 2018-11-05 NOTE — Progress Notes (Signed)
Subjective:  Patient ID: Keith [name], male    DOB: Aug 11, 1940  Age: 78 y.o. MRN: [id number]  CC: Hyperlipidemia and Hypothyroidism   HPI Keith [name] presents for f/up - He complains of fatigue and weight gain.  He tells me he is taking levothyroxine.  Outpatient Medications Prior to Visit  Medication Sig Dispense Refill  . Calcium Carb-Cholecalciferol (CALCIUM + D3 PO) Take 1 tablet by mouth daily.    . ferrous sulfate 325 (65 FE) MG EC tablet Take 325 mg by mouth daily.    Marland Kitchen levothyroxine (SYNTHROID) 137 MCG tablet TAKE 1 TABLET(137 MCG) BY MOUTH DAILY BEFORE BREAKFAST 90 tablet 1  . LIVALO 1 MG TABS TAKE 1 TABLET(1 MG) BY MOUTH DAILY 90 tablet 1  . fluticasone (FLONASE) 50 MCG/ACT nasal spray INSTILL 2 SPRAYS INTO EACH NOSTRIL ONCE DAILY 16 g 11  . Magnesium 250 MG TABS Take 250 mg by mouth daily.     No facility-administered medications prior to visit.     ROS Review of Systems  Constitutional: Positive for fatigue and unexpected weight change (wt gain). Negative for diaphoresis.  HENT: Negative.   Respiratory: Negative.  Negative for cough, chest tightness, shortness of breath and wheezing.   Cardiovascular: Negative for chest pain, palpitations and leg swelling.  Gastrointestinal: Negative for abdominal pain, constipation, diarrhea, nausea and vomiting.  Endocrine: Negative for cold intolerance and heat intolerance.  Genitourinary: Negative.  Negative for difficulty urinating.  Musculoskeletal: Negative.  Negative for arthralgias and myalgias.  Skin: Negative.  Negative for color change, pallor and rash.  Neurological: Negative.  Negative for dizziness, weakness and light-headedness.  Hematological: Negative for adenopathy. Does not bruise/bleed easily.  Psychiatric/Behavioral: Negative.     Objective:  BP 120/80 (BP Location: Left Arm, Patient Position: Sitting, Cuff Size: Normal)   Pulse 72   Temp 98.5 F (36.9 C) (Oral)   Ht 5\' 10"  (1.778 m)   Wt 195 lb  (88.5 kg)   SpO2 98%   BMI 27.98 kg/m   BP Readings from Last 3 Encounters:  11/05/18 120/80  10/11/18 102/66  09/06/18 128/88    Wt Readings from Last 3 Encounters:  11/05/18 195 lb (88.5 kg)  10/11/18 193 lb (87.5 kg)  09/06/18 194 lb (88 kg)    Physical Exam Vitals signs reviewed.  Constitutional:      Appearance: Normal appearance.  HENT:     Nose: Nose normal.     Mouth/Throat:     Mouth: Mucous membranes are moist.  Eyes:     General: No scleral icterus.    Conjunctiva/sclera: Conjunctivae normal.  Neck:     Musculoskeletal: Normal range of motion. No muscular tenderness.  Cardiovascular:     Rate and Rhythm: Normal rate and regular rhythm.     Heart sounds: No murmur.  Pulmonary:     Effort: Pulmonary effort is normal.     Breath sounds: No stridor. No wheezing, rhonchi or rales.  Abdominal:     General: Abdomen is protuberant. Bowel sounds are normal. There is no distension.     Palpations: Abdomen is soft. There is no hepatomegaly, splenomegaly or mass.     Tenderness: There is no abdominal tenderness.     Hernia: No hernia is present.  Musculoskeletal: Normal range of motion.     Right lower leg: No edema.     Left lower leg: No edema.  Lymphadenopathy:     Cervical: No cervical adenopathy.  Skin:    General: Skin  is warm and dry.  Neurological:     General: No focal deficit present.     Mental Status: He is alert.  Psychiatric:        Mood and Affect: Mood normal.        Behavior: Behavior normal.     Lab Results  Component Value Date   WBC 9.3 11/05/2018   HGB 15.5 11/05/2018   HCT 46.9 11/05/2018   PLT 200.0 11/05/2018   GLUCOSE 94 11/05/2018   CHOL 158 11/05/2018   TRIG 147.0 11/05/2018   HDL 43.00 11/05/2018   LDLCALC 86 11/05/2018   ALT 11 01/01/2018   AST 14 01/01/2018   NA 141 11/05/2018   K 4.5 11/05/2018   CL 106 11/05/2018   CREATININE 1.09 11/05/2018   BUN 17 11/05/2018   CO2 27 11/05/2018   TSH 21.22 (H) 11/05/2018    PSA 3.66 11/05/2018   INR 1.01 11/27/2017   HGBA1C 5.9 01/01/2018    No results found.  Assessment & Plan:   Keith [name] was seen today for hyperlipidemia and hypothyroidism.  Diagnoses and all orders for this visit:  Acquired hypothyroidism- His TSH is elevated at 21.  I have asked him to increase the dose of levothyroxine and to switch to brand-name Synthroid for better reliability. -     TSH; Future -     levothyroxine (SYNTHROID) 175 MCG tablet; Take 1 tablet (175 mcg total) by mouth daily before breakfast.  Hyperlipidemia with target LDL less than 130- He has achieved his LDL goal and is doing well on the statin. -     Lipid panel; Future -     Pitavastatin Calcium (LIVALO) 1 MG TABS; TAKE 1 TABLET(1 MG) BY MOUTH DAILY  Prostate nodule- His PSA is not rising which is reassuring that he does not have prostate cancer. -     PSA; Future  Need for influenza vaccination -     Flu Vaccine QUAD High Dose(Fluad)  Routine general medical examination at a health care facility- Exam completed, labs reviewed, vaccines reviewed, colon cancer screening is up-to-date, patient education was given.  Senile purpura (Keith [name])- No complications noted related to this. -     CBC with Differential/Platelet; Future -     Basic metabolic panel; Future   I have discontinued Keith [name]'s Magnesium, fluticasone, and levothyroxine. I have also changed his Livalo. Additionally, I am having him start on levothyroxine. Lastly, I am having him maintain his ferrous sulfate and Calcium Carb-Cholecalciferol (CALCIUM + D3 PO).  Meds ordered this encounter  Medications  . levothyroxine (SYNTHROID) 175 MCG tablet    Sig: Take 1 tablet (175 mcg total) by mouth daily before breakfast.    Dispense:  90 tablet    Refill:  0  . Pitavastatin Calcium (LIVALO) 1 MG TABS    Sig: TAKE 1 TABLET(1 MG) BY MOUTH DAILY    Dispense:  90 tablet    Refill:  1     Follow-up: Return in about 4 months (around  03/07/2019).  Scarlette Calico, MD

## 2018-11-05 NOTE — Patient Instructions (Signed)

## 2018-11-06 MED ORDER — LIVALO 1 MG PO TABS: ORAL_TABLET | ORAL | 1 refills | Status: DC

## 2018-11-06 MED ORDER — LEVOTHYROXINE SODIUM 175 MCG PO TABS: 175.0000 ug | ORAL_TABLET | Freq: Every day | ORAL | 0 refills | Status: DC

## 2018-12-05 ENCOUNTER — Telehealth: Payer: Self-pay | Admitting: *Deleted

## 2018-12-05 ENCOUNTER — Other Ambulatory Visit: Payer: Self-pay

## 2018-12-05 DIAGNOSIS — M5416 Radiculopathy, lumbar region: Secondary | ICD-10-CM

## 2018-12-05 NOTE — Telephone Encounter (Signed)
That would be fine same level

## 2018-12-05 NOTE — Telephone Encounter (Signed)
Order placed

## 2018-12-05 NOTE — Telephone Encounter (Signed)
Pt left msg stating he's starting to have back pain again & would like another epidural sent to [facility].

## 2019-01-10 ENCOUNTER — Encounter: Payer: Self-pay | Admitting: Internal Medicine

## 2019-01-10 ENCOUNTER — Other Ambulatory Visit (INDEPENDENT_AMBULATORY_CARE_PROVIDER_SITE_OTHER): Payer: Medicare Other

## 2019-01-10 ENCOUNTER — Ambulatory Visit (INDEPENDENT_AMBULATORY_CARE_PROVIDER_SITE_OTHER): Payer: Medicare Other | Admitting: Internal Medicine

## 2019-01-10 ENCOUNTER — Other Ambulatory Visit: Payer: Self-pay

## 2019-01-10 VITALS — BP 122/82 | HR 68 | Temp 97.7°F | Resp 16 | Ht 70.0 in | Wt 196.8 lb

## 2019-01-10 DIAGNOSIS — E039 Hypothyroidism, unspecified: Secondary | ICD-10-CM

## 2019-01-10 LAB — TSH: TSH: 0.15 u[IU]/mL — ABNORMAL LOW (ref 0.35–4.50)

## 2019-01-10 NOTE — Progress Notes (Signed)
Subjective:  Patient ID: Keith [name], male    DOB: [birth year]  Age: 78 y.o. MRN: [id number]  CC: Hypothyroidism  This visit occurred during the SARS-CoV-2 public health emergency.  Safety protocols were in place, including screening questions prior to the visit, additional usage of staff PPE, and extensive cleaning of exam room while observing appropriate contact time as indicated for disinfecting solutions.   HPI Keith [name] presents for f/up - He feels well today and offers no complaints.  He tells me he is taking his thyroid supplement differently.  He was previously taking it with meds and food but now he takes it and does not put anything in his mouth for 2 hours.  He feels like the current dosage is at target.  Outpatient Medications Prior to Visit  Medication Sig Dispense Refill  . Calcium Carb-Cholecalciferol (CALCIUM + D3 PO) Take 1 tablet by mouth daily.    . ferrous sulfate 325 (65 FE) MG EC tablet Take 325 mg by mouth daily.    . Pitavastatin Calcium (LIVALO) 1 MG TABS TAKE 1 TABLET(1 MG) BY MOUTH DAILY 90 tablet 1  . levothyroxine (SYNTHROID) 175 MCG tablet Take 1 tablet (175 mcg total) by mouth daily before breakfast. 90 tablet 0   No facility-administered medications prior to visit.     ROS Review of Systems  Constitutional: Negative for appetite change, diaphoresis, fatigue and unexpected weight change.  HENT: Negative.   Eyes: Negative for visual disturbance.  Respiratory: Negative for cough, chest tightness, shortness of breath and wheezing.   Cardiovascular: Negative for chest pain, palpitations and leg swelling.  Gastrointestinal: Negative for abdominal pain, constipation, diarrhea, nausea and vomiting.  Endocrine: Negative for cold intolerance.  Genitourinary: Negative.  Negative for difficulty urinating.  Musculoskeletal: Negative for arthralgias and myalgias.  Skin: Negative.  Negative for color change.  Neurological: Negative for dizziness, weakness and  light-headedness.  Hematological: Negative for adenopathy. Does not bruise/bleed easily.  Psychiatric/Behavioral: Negative.  Negative for dysphoric mood and sleep disturbance. The patient is not nervous/anxious.     Objective:  BP 122/82 (BP Location: Left Arm, Patient Position: Sitting, Cuff Size: Large)   Pulse 68   Temp 97.7 F (36.5 C) (Oral)   Resp 16   Ht 5\' 10"  (1.778 m)   Wt 196 lb 12 oz (89.2 kg)   SpO2 98%   BMI 28.23 kg/m   BP Readings from Last 3 Encounters:  01/10/19 122/82  11/05/18 120/80  10/11/18 102/66    Wt Readings from Last 3 Encounters:  01/10/19 196 lb 12 oz (89.2 kg)  11/05/18 195 lb (88.5 kg)  10/11/18 193 lb (87.5 kg)    Physical Exam Vitals signs reviewed.  Constitutional:      Appearance: Normal appearance.  HENT:     Nose: Nose normal.     Mouth/Throat:     Mouth: Mucous membranes are moist.  Eyes:     General: No scleral icterus.    Conjunctiva/sclera: Conjunctivae normal.  Neck:     Musculoskeletal: Neck supple.  Cardiovascular:     Rate and Rhythm: Normal rate and regular rhythm.  Pulmonary:     Effort: Pulmonary effort is normal.     Breath sounds: No wheezing or rales.  Abdominal:     General: Abdomen is flat.     Palpations: There is no mass.     Tenderness: There is no abdominal tenderness.  Musculoskeletal: Normal range of motion.     Right lower leg: No  edema.     Left lower leg: No edema.  Lymphadenopathy:     Cervical: No cervical adenopathy.  Skin:    General: Skin is warm and dry.     Coloration: Skin is not pale.  Neurological:     General: No focal deficit present.     Mental Status: He is alert.  Psychiatric:        Mood and Affect: Mood normal.     Lab Results  Component Value Date   WBC 9.3 11/05/2018   HGB 15.5 11/05/2018   HCT 46.9 11/05/2018   PLT 200.0 11/05/2018   GLUCOSE 94 11/05/2018   CHOL 158 11/05/2018   TRIG 147.0 11/05/2018   HDL 43.00 11/05/2018   LDLCALC 86 11/05/2018   ALT 11  01/01/2018   AST 14 01/01/2018   NA 141 11/05/2018   K 4.5 11/05/2018   CL 106 11/05/2018   CREATININE 1.09 11/05/2018   BUN 17 11/05/2018   CO2 27 11/05/2018   TSH 0.15 (L) 01/10/2019   PSA 3.66 11/05/2018   INR 1.01 11/27/2017   HGBA1C 5.9 01/01/2018    No results found.  Assessment & Plan:   Fonnie was seen today for hypothyroidism.  Diagnoses and all orders for this visit:  Acquired hypothyroidism- His TSH is low at 0.15.  I have asked him to decrease his levothyroxine dose from 175 mcg a day to 150 mcg a day.  I have also asked him to return in 2 to 3 months for me to recheck his TSH level. -     TSH; Future -     levothyroxine (SYNTHROID) 150 MCG tablet; Take 1 tablet (150 mcg total) by mouth daily.   I have discontinued Mavric A. Deyo's levothyroxine. I am also having him start on levothyroxine. Additionally, I am having him maintain his ferrous sulfate, Calcium Carb-Cholecalciferol (CALCIUM + D3 PO), and Livalo.  Meds ordered this encounter  Medications  . levothyroxine (SYNTHROID) 150 MCG tablet    Sig: Take 1 tablet (150 mcg total) by mouth daily.    Dispense:  90 tablet    Refill:  0     Follow-up: No follow-ups on file.  Scarlette Calico, MD

## 2019-01-11 ENCOUNTER — Encounter: Payer: Self-pay | Admitting: Internal Medicine

## 2019-01-11 MED ORDER — LEVOTHYROXINE SODIUM 150 MCG PO TABS: 150.0000 ug | ORAL_TABLET | Freq: Every day | ORAL | 0 refills | Status: DC

## 2019-01-11 NOTE — Patient Instructions (Signed)
Hypothyroidism  Hypothyroidism is when the thyroid gland does not make enough of certain hormones (it is underactive). The thyroid gland is a small gland located in the lower front part of the neck, just in front of the windpipe (trachea). This gland makes hormones that help control how the body uses food for energy (metabolism) as well as how the heart and brain function. These hormones also play a role in keeping your bones strong. When the thyroid is underactive, it produces too little of the hormones thyroxine (T4) and triiodothyronine (T3). What are the causes? This condition may be caused by:  Hashimoto's disease. This is a disease in which the body's disease-fighting system (immune system) attacks the thyroid gland. This is the most common cause.  Viral infections.  Pregnancy.  Certain medicines.  Birth defects.  Past radiation treatments to the head or neck for cancer.  Past treatment with radioactive iodine.  Past exposure to radiation in the environment.  Past surgical removal of part or all of the thyroid.  Problems with a gland in the center of the brain (pituitary gland).  Lack of enough iodine in the diet. What increases the risk? You are more likely to develop this condition if:  You are male.  You have a family history of thyroid conditions.  You use a medicine called lithium.  You take medicines that affect the immune system (immunosuppressants). What are the signs or symptoms? Symptoms of this condition include:  Feeling as though you have no energy (lethargy).  Not being able to tolerate cold.  Weight gain that is not explained by a change in diet or exercise habits.  Lack of appetite.  Dry skin.  Coarse hair.  Menstrual irregularity.  Slowing of thought processes.  Constipation.  Sadness or depression. How is this diagnosed? This condition may be diagnosed based on:  Your symptoms, your medical history, and a physical exam.  Blood  tests. You may also have imaging tests, such as an ultrasound or MRI. How is this treated? This condition is treated with medicine that replaces the thyroid hormones that your body does not make. After you begin treatment, it may take several weeks for symptoms to go away. Follow these instructions at home:  Take over-the-counter and prescription medicines only as told by your health care provider.  If you start taking any new medicines, tell your health care provider.  Keep all follow-up visits as told by your health care provider. This is important. ? As your condition improves, your dosage of thyroid hormone medicine may change. ? You will need to have blood tests regularly so that your health care provider can monitor your condition. Contact a health care provider if:  Your symptoms do not get better with treatment.  You are taking thyroid replacement medicine and you: ? Sweat a lot. ? Have tremors. ? Feel anxious. ? Lose weight rapidly. ? Cannot tolerate heat. ? Have emotional swings. ? Have diarrhea. ? Feel weak. Get help right away if you have:  Chest pain.  An irregular heartbeat.  A rapid heartbeat.  Difficulty breathing. Summary  Hypothyroidism is when the thyroid gland does not make enough of certain hormones (it is underactive).  When the thyroid is underactive, it produces too little of the hormones thyroxine (T4) and triiodothyronine (T3).  The most common cause is Hashimoto's disease, a disease in which the body's disease-fighting system (immune system) attacks the thyroid gland. The condition can also be caused by viral infections, medicine, pregnancy, or past   radiation treatment to the head or neck.  Symptoms may include weight gain, dry skin, constipation, feeling as though you do not have energy, and not being able to tolerate cold.  This condition is treated with medicine to replace the thyroid hormones that your body does not make. This information  is not intended to replace advice given to you by your health care provider. Make sure you discuss any questions you have with your health care provider. Document Released: 01/24/2005 Document Revised: 01/06/2017 Document Reviewed: 01/04/2017 Elsevier Patient Education  2020 Elsevier Inc.  

## 2019-03-20 ENCOUNTER — Encounter: Payer: Self-pay | Admitting: Internal Medicine

## 2019-03-20 ENCOUNTER — Other Ambulatory Visit: Payer: Medicare PPO

## 2019-03-20 ENCOUNTER — Other Ambulatory Visit: Payer: Self-pay

## 2019-03-20 ENCOUNTER — Ambulatory Visit (INDEPENDENT_AMBULATORY_CARE_PROVIDER_SITE_OTHER): Payer: Medicare PPO | Admitting: Internal Medicine

## 2019-03-20 VITALS — BP 132/80 | HR 66 | Temp 98.1°F | Resp 16 | Ht 70.0 in | Wt 202.0 lb

## 2019-03-20 DIAGNOSIS — E039 Hypothyroidism, unspecified: Secondary | ICD-10-CM

## 2019-03-20 LAB — TSH: TSH: 0.34 u[IU]/mL — ABNORMAL LOW (ref 0.35–4.50)

## 2019-03-20 MED ORDER — LEVOTHYROXINE SODIUM 100 MCG PO TABS: 100.0000 ug | ORAL_TABLET | Freq: Every day | ORAL | 0 refills | Status: DC

## 2019-03-20 NOTE — Progress Notes (Signed)
Subjective:  Patient ID: Keith [name], male    DOB: Dec 31, 1940  Age: 79 y.o. MRN: [id number]  CC: Hypothyroidism  This visit occurred during the SARS-CoV-2 public health emergency.  Safety protocols were in place, including screening questions prior to the visit, additional usage of staff PPE, and extensive cleaning of exam room while observing appropriate contact time as indicated for disinfecting solutions.    HPI Keith [name] presents for f/up - He complains of weight gain but otherwise feels well and offers no other complaints.  He tells me his sleep, bowel movements, and activity levels are okay.  Outpatient Medications Prior to Visit  Medication Sig Dispense Refill  . Calcium Carb-Cholecalciferol (CALCIUM + D3 PO) Take 1 tablet by mouth daily.    . ferrous sulfate 325 (65 FE) MG EC tablet Take 325 mg by mouth daily.    . Pitavastatin Calcium (LIVALO) 1 MG TABS TAKE 1 TABLET(1 MG) BY MOUTH DAILY 90 tablet 1  . levothyroxine (SYNTHROID) 150 MCG tablet Take 1 tablet (150 mcg total) by mouth daily. 90 tablet 0   No facility-administered medications prior to visit.    ROS Review of Systems  Constitutional: Positive for unexpected weight change. Negative for diaphoresis and fatigue.  HENT: Negative.   Eyes: Negative.   Respiratory: Negative for cough, chest tightness, shortness of breath and wheezing.   Cardiovascular: Negative for chest pain, palpitations and leg swelling.  Gastrointestinal: Negative for abdominal pain, constipation, diarrhea, nausea and vomiting.  Endocrine: Negative for cold intolerance and heat intolerance.  Genitourinary: Negative.  Negative for difficulty urinating.  Musculoskeletal: Negative.  Negative for arthralgias and back pain.  Skin: Negative.  Negative for color change and pallor.  Neurological: Negative.  Negative for dizziness, weakness and light-headedness.  Hematological: Negative for adenopathy. Does not bruise/bleed easily.   Psychiatric/Behavioral: Negative.     Objective:  BP 132/80 (BP Location: Left Arm, Patient Position: Sitting, Cuff Size: Large)   Pulse 66   Temp 98.1 F (36.7 C) (Oral)   Resp 16   Ht 5\' 10"  (1.778 m)   Wt 202 lb (91.6 kg)   SpO2 99%   BMI 28.98 kg/m   BP Readings from Last 3 Encounters:  03/20/19 132/80  01/10/19 122/82  11/05/18 120/80    Wt Readings from Last 3 Encounters:  03/20/19 202 lb (91.6 kg)  01/10/19 196 lb 12 oz (89.2 kg)  11/05/18 195 lb (88.5 kg)    Physical Exam Vitals reviewed.  Constitutional:      Appearance: Normal appearance.  HENT:     Nose: Nose normal.     Mouth/Throat:     Mouth: Mucous membranes are moist.  Eyes:     General: No scleral icterus.    Conjunctiva/sclera: Conjunctivae normal.  Cardiovascular:     Rate and Rhythm: Normal rate and regular rhythm.     Heart sounds: No murmur.  Pulmonary:     Effort: Pulmonary effort is normal.     Breath sounds: No stridor. No wheezing, rhonchi or rales.  Abdominal:     General: Abdomen is flat. Bowel sounds are normal.     Palpations: Abdomen is soft. There is no hepatomegaly, splenomegaly or mass.     Tenderness: There is no abdominal tenderness.  Musculoskeletal:        General: Normal range of motion.     Cervical back: Neck supple.     Right lower leg: No edema.     Left lower leg: No edema.  Lymphadenopathy:     Cervical: No cervical adenopathy.  Skin:    General: Skin is warm and dry.     Coloration: Skin is not pale.  Neurological:     General: No focal deficit present.     Mental Status: He is alert.  Psychiatric:        Mood and Affect: Mood normal.        Behavior: Behavior normal.     Lab Results  Component Value Date   WBC 9.3 11/05/2018   HGB 15.5 11/05/2018   HCT 46.9 11/05/2018   PLT 200.0 11/05/2018   GLUCOSE 94 11/05/2018   CHOL 158 11/05/2018   TRIG 147.0 11/05/2018   HDL 43.00 11/05/2018   LDLCALC 86 11/05/2018   ALT 11 01/01/2018   AST 14  01/01/2018   NA 141 11/05/2018   K 4.5 11/05/2018   CL 106 11/05/2018   CREATININE 1.09 11/05/2018   BUN 17 11/05/2018   CO2 27 11/05/2018   TSH 0.34 (L) 03/20/2019   PSA 3.66 11/05/2018   INR 1.01 11/27/2017   HGBA1C 5.9 01/01/2018    No results found.  Assessment & Plan:   Keith [name] was seen today for hypothyroidism.  Diagnoses and all orders for this visit:  Acquired hypothyroidism- His TSH is suppressed down to 0.34.  I recommended that he decrease his dose of levothyroxine and recurrent in 2 to 3 months for me to recheck his TSH level. -     TSH; Future -     levothyroxine (SYNTHROID) 100 MCG tablet; Take 1 tablet (100 mcg total) by mouth daily before breakfast.   I have discontinued Keith [name]'s levothyroxine. I am also having him start on levothyroxine. Additionally, I am having him maintain his ferrous sulfate, Calcium Carb-Cholecalciferol (CALCIUM + D3 PO), and Livalo.  Meds ordered this encounter  Medications  . levothyroxine (SYNTHROID) 100 MCG tablet    Sig: Take 1 tablet (100 mcg total) by mouth daily before breakfast.    Dispense:  90 tablet    Refill:  0     Follow-up: Return in about 6 months (around 09/17/2019).  Scarlette Calico, MD

## 2019-03-20 NOTE — Patient Instructions (Signed)
Hypothyroidism  Hypothyroidism is when the thyroid gland does not make enough of certain hormones (it is underactive). The thyroid gland is a small gland located in the lower front part of the neck, just in front of the windpipe (trachea). This gland makes hormones that help control how the body uses food for energy (metabolism) as well as how the heart and brain function. These hormones also play a role in keeping your bones strong. When the thyroid is underactive, it produces too little of the hormones thyroxine (T4) and triiodothyronine (T3). What are the causes? This condition may be caused by:  Hashimoto's disease. This is a disease in which the body's disease-fighting system (immune system) attacks the thyroid gland. This is the most common cause.  Viral infections.  Pregnancy.  Certain medicines.  Birth defects.  Past radiation treatments to the head or neck for cancer.  Past treatment with radioactive iodine.  Past exposure to radiation in the environment.  Past surgical removal of part or all of the thyroid.  Problems with a gland in the center of the brain (pituitary gland).  Lack of enough iodine in the diet. What increases the risk? You are more likely to develop this condition if:  You are male.  You have a family history of thyroid conditions.  You use a medicine called lithium.  You take medicines that affect the immune system (immunosuppressants). What are the signs or symptoms? Symptoms of this condition include:  Feeling as though you have no energy (lethargy).  Not being able to tolerate cold.  Weight gain that is not explained by a change in diet or exercise habits.  Lack of appetite.  Dry skin.  Coarse hair.  Menstrual irregularity.  Slowing of thought processes.  Constipation.  Sadness or depression. How is this diagnosed? This condition may be diagnosed based on:  Your symptoms, your medical history, and a physical exam.  Blood  tests. You may also have imaging tests, such as an ultrasound or MRI. How is this treated? This condition is treated with medicine that replaces the thyroid hormones that your body does not make. After you begin treatment, it may take several weeks for symptoms to go away. Follow these instructions at home:  Take over-the-counter and prescription medicines only as told by your health care provider.  If you start taking any new medicines, tell your health care provider.  Keep all follow-up visits as told by your health care provider. This is important. ? As your condition improves, your dosage of thyroid hormone medicine may change. ? You will need to have blood tests regularly so that your health care provider can monitor your condition. Contact a health care provider if:  Your symptoms do not get better with treatment.  You are taking thyroid replacement medicine and you: ? Sweat a lot. ? Have tremors. ? Feel anxious. ? Lose weight rapidly. ? Cannot tolerate heat. ? Have emotional swings. ? Have diarrhea. ? Feel weak. Get help right away if you have:  Chest pain.  An irregular heartbeat.  A rapid heartbeat.  Difficulty breathing. Summary  Hypothyroidism is when the thyroid gland does not make enough of certain hormones (it is underactive).  When the thyroid is underactive, it produces too little of the hormones thyroxine (T4) and triiodothyronine (T3).  The most common cause is Hashimoto's disease, a disease in which the body's disease-fighting system (immune system) attacks the thyroid gland. The condition can also be caused by viral infections, medicine, pregnancy, or past   radiation treatment to the head or neck.  Symptoms may include weight gain, dry skin, constipation, feeling as though you do not have energy, and not being able to tolerate cold.  This condition is treated with medicine to replace the thyroid hormones that your body does not make. This information  is not intended to replace advice given to you by your health care provider. Make sure you discuss any questions you have with your health care provider. Document Revised: 01/06/2017 Document Reviewed: 01/04/2017 Elsevier Patient Education  2020 Elsevier Inc.  

## 2019-03-25 ENCOUNTER — Ambulatory Visit: Payer: Medicare PPO | Attending: Internal Medicine

## 2019-03-25 ENCOUNTER — Other Ambulatory Visit: Payer: Self-pay

## 2019-03-25 DIAGNOSIS — Z23 Encounter for immunization: Secondary | ICD-10-CM | POA: Insufficient documentation

## 2019-03-25 NOTE — Progress Notes (Signed)
   Covid-19 Vaccination Clinic  Name:  Keith [name]    MRN: [id number] DOB: [birth year]  03/25/2019  Mr. Stankus was observed post Covid-19 immunization for 15 minutes without incidence. He was provided with Vaccine Information Sheet and instruction to access the V-Safe system.   Mr. Hensler was instructed to call 911 with any severe reactions post vaccine: Marland Kitchen Difficulty breathing  . Swelling of your face and throat  . A fast heartbeat  . A bad rash all over your body  . Dizziness and weakness    Immunizations Administered    Name Date Dose VIS Date Route   Moderna COVID-19 Vaccine 03/25/2019 11:06 AM 0.5 mL 01/08/2019 Intramuscular   Manufacturer: Moderna   Lot: [id number]   [id number]

## 2019-04-01 ENCOUNTER — Telehealth: Payer: Self-pay

## 2019-04-01 NOTE — Telephone Encounter (Signed)
error 

## 2019-04-23 ENCOUNTER — Ambulatory Visit: Payer: Medicare PPO | Attending: Internal Medicine

## 2019-04-23 DIAGNOSIS — Z23 Encounter for immunization: Secondary | ICD-10-CM

## 2019-04-23 NOTE — Progress Notes (Signed)
   Covid-19 Vaccination Clinic  Name:  Keith [name]    MRN: [id number] DOB: [birth year]  04/23/2019  Mr. Center was observed post Covid-19 immunization for 15 minutes without incident. He was provided with Vaccine Information Sheet and instruction to access the V-Safe system.   [name] was instructed to call 911 with any severe reactions post vaccine: Marland Kitchen Difficulty breathing  . Swelling of face and throat  . A fast heartbeat  . A bad rash all over body  . Dizziness and weakness   Immunizations Administered    Name Date Dose VIS Date Route   Moderna COVID-19 Vaccine 04/23/2019 10:51 AM 0.5 mL 01/08/2019 Intramuscular   Manufacturer: Moderna   Lot: [id number]   [id number]

## 2019-05-23 ENCOUNTER — Other Ambulatory Visit: Payer: Self-pay

## 2019-05-23 ENCOUNTER — Ambulatory Visit
Admission: RE | Admit: 2019-05-23 | Discharge: 2019-05-23 | Disposition: A | Discharge status: Home / Self Care | Payer: Medicare PPO | Source: Ambulatory Visit | Attending: Family Medicine | Admitting: Family Medicine

## 2019-05-23 DIAGNOSIS — M5416 Radiculopathy, lumbar region: Secondary | ICD-10-CM

## 2019-05-23 DIAGNOSIS — M5417 Radiculopathy, lumbosacral region: Secondary | ICD-10-CM | POA: Diagnosis not present

## 2019-05-23 MED ORDER — IOPAMIDOL (ISOVUE-M 200) INJECTION 41%
1.0000 mL | Freq: Once | INTRAMUSCULAR | Status: AC
  Administered 2019-05-23: 1 mL via EPIDURAL

## 2019-05-23 MED ORDER — METHYLPREDNISOLONE ACETATE 40 MG/ML INJ SUSP (RADIOLOG
120.0000 mg | Freq: Once | INTRAMUSCULAR | Status: AC
  Administered 2019-05-23: 14:00:00 120 mg via EPIDURAL

## 2019-05-23 NOTE — Discharge Instructions (Signed)

## 2019-06-06 ENCOUNTER — Ambulatory Visit (INDEPENDENT_AMBULATORY_CARE_PROVIDER_SITE_OTHER): Payer: Medicare PPO

## 2019-06-06 ENCOUNTER — Ambulatory Visit: Payer: Medicare PPO | Admitting: Internal Medicine

## 2019-06-06 ENCOUNTER — Encounter: Payer: Self-pay | Admitting: Internal Medicine

## 2019-06-06 ENCOUNTER — Other Ambulatory Visit: Payer: Self-pay

## 2019-06-06 VITALS — BP 116/80 | HR 64 | Temp 98.4°F | Resp 16 | Wt 196.0 lb

## 2019-06-06 VITALS — BP 116/80 | HR 60 | Temp 98.4°F | Resp 16 | Ht 70.0 in | Wt 196.8 lb

## 2019-06-06 DIAGNOSIS — E039 Hypothyroidism, unspecified: Secondary | ICD-10-CM

## 2019-06-06 DIAGNOSIS — Z0000 Encounter for general adult medical examination without abnormal findings: Secondary | ICD-10-CM | POA: Diagnosis not present

## 2019-06-06 LAB — TSH: TSH: 12.34 u[IU]/mL — ABNORMAL HIGH (ref 0.35–4.50)

## 2019-06-06 NOTE — Progress Notes (Signed)
Subjective:  Patient ID: Keith [name], male    DOB: May 29, 1940  Age: 79 y.o. MRN: [id number]  CC: Hypothyroidism  This visit occurred during the SARS-CoV-2 public health emergency.  Safety protocols were in place, including screening questions prior to the visit, additional usage of staff PPE, and extensive cleaning of exam room while observing appropriate contact time as indicated for disinfecting solutions.    HPI Keith [name] presents for f/up - He has felt well recently and offers no complaints.  He tells me he is compliant with the current dose of levothyroxine.  Outpatient Medications Prior to Visit  Medication Sig Dispense Refill  . Calcium Carb-Cholecalciferol (CALCIUM + D3 PO) Take 1 tablet by mouth daily.    . ferrous sulfate 325 (65 FE) MG EC tablet Take 325 mg by mouth daily.    . Pitavastatin Calcium (LIVALO) 1 MG TABS TAKE 1 TABLET(1 MG) BY MOUTH DAILY 90 tablet 1  . levothyroxine (SYNTHROID) 100 MCG tablet Take 1 tablet (100 mcg total) by mouth daily before breakfast. 90 tablet 0   No facility-administered medications prior to visit.    ROS Review of Systems  Constitutional: Negative for diaphoresis, fatigue and unexpected weight change.  HENT: Negative.   Eyes: Negative.   Respiratory: Negative for cough, chest tightness, shortness of breath and wheezing.   Cardiovascular: Negative for chest pain, palpitations and leg swelling.  Gastrointestinal: Negative for abdominal pain, constipation, diarrhea, nausea and vomiting.  Endocrine: Negative for cold intolerance and heat intolerance.  Genitourinary: Negative.  Negative for difficulty urinating.  Musculoskeletal: Negative for arthralgias and myalgias.  Skin: Negative.   Neurological: Negative.  Negative for dizziness.  Hematological: Negative for adenopathy. Does not bruise/bleed easily.  Psychiatric/Behavioral: Negative.     Objective:  BP 116/80   Pulse 64   Temp 98.4 F (36.9 C) (Oral)   Resp 16   Wt  196 lb (88.9 kg)   SpO2 98%   BMI 28.12 kg/m   BP Readings from Last 3 Encounters:  06/07/19 116/80  06/06/19 116/80  05/23/19 131/86    Wt Readings from Last 3 Encounters:  06/07/19 196 lb (88.9 kg)  06/06/19 196 lb 12.8 oz (89.3 kg)  03/20/19 202 lb (91.6 kg)    Physical Exam Vitals reviewed.  Constitutional:      Appearance: Normal appearance.  HENT:     Nose: Nose normal.     Mouth/Throat:     Mouth: Mucous membranes are moist.  Eyes:     General: No scleral icterus.    Conjunctiva/sclera: Conjunctivae normal.  Cardiovascular:     Rate and Rhythm: Normal rate and regular rhythm.     Heart sounds: No murmur.  Pulmonary:     Effort: Pulmonary effort is normal.     Breath sounds: No stridor. No wheezing, rhonchi or rales.  Abdominal:     General: Abdomen is flat. Bowel sounds are normal. There is no distension.     Palpations: Abdomen is soft. There is no hepatomegaly, splenomegaly or mass.     Tenderness: There is no abdominal tenderness.  Musculoskeletal:        General: Normal range of motion.     Cervical back: Neck supple.     Right lower leg: No edema.     Left lower leg: No edema.  Skin:    General: Skin is warm and dry.  Neurological:     General: No focal deficit present.     Mental Status: He is  alert.  Psychiatric:        Mood and Affect: Mood normal.        Behavior: Behavior normal.     Lab Results  Component Value Date   WBC 9.3 11/05/2018   HGB 15.5 11/05/2018   HCT 46.9 11/05/2018   PLT 200.0 11/05/2018   GLUCOSE 94 11/05/2018   CHOL 158 11/05/2018   TRIG 147.0 11/05/2018   HDL 43.00 11/05/2018   LDLCALC 86 11/05/2018   ALT 11 01/01/2018   AST 14 01/01/2018   NA 141 11/05/2018   K 4.5 11/05/2018   CL 106 11/05/2018   CREATININE 1.09 11/05/2018   BUN 17 11/05/2018   CO2 27 11/05/2018   TSH 12.34 (H) 06/06/2019   PSA 3.66 11/05/2018   INR 1.01 11/27/2017   HGBA1C 5.9 01/01/2018    DG Epidural/Nerve Root  Result Date:  05/23/2019 CLINICAL DATA:  Lumbosacral spondylosis without myelopathy. Six-month history of low back pain radiating into both hips, worse on the left. Prior surgery at L4-L5. L4-L5 transforaminal injection requested. EXAM: EPIDURAL/NERVE ROOT FLUOROSCOPY TIME:  Radiation Exposure Index (as provided by the fluoroscopic device): 4.0 mGy Fluoroscopy Time:  22 seconds Number of Acquired Images:  0 PROCEDURE: The procedure, risks, benefits, and alternatives were explained to the patient. Questions regarding the procedure were encouraged and answered. The patient understands and consents to the procedure. LEFT L4 NERVE ROOT BLOCK AND TRANSFORAMINAL EPIDURAL: A posterior oblique approach was taken to the intervertebral foramen on the left at L4-L5 using a 5 inch 22 gauge spinal needle. Injection of Isovue M 200 outlined the left L4 nerve root and showed good epidural spread. No vascular opacification is seen. 120 mg of Depo-Medrol mixed with 2 mL of 1% lidocaine were instilled. The procedure was well-tolerated, and the patient was discharged thirty minutes following the injection in good condition. COMPLICATIONS: None immediate. IMPRESSION: Technically successful injection consisting of a left L4 nerve root block and transforaminal epidural. Electronically Signed   By: Titus Dubin M.D.   On: 05/23/2019 14:21    Assessment & Plan:   Viraaj was seen today for hypothyroidism.  Diagnoses and all orders for this visit:  Acquired hypothyroidism- His TSH is mildly elevated.  I recommend a 12% increase in the dose of levothyroxine.  I have asked him to come back in 3 to 4 months to have his TSH level rechecked. -     TSH; Future -     TSH -     levothyroxine (SYNTHROID) 112 MCG tablet; Take 1 tablet (112 mcg total) by mouth daily.   I have discontinued Dairon A. Plunk's levothyroxine. I am also having him start on levothyroxine. Additionally, I am having him maintain his ferrous sulfate, Calcium  Carb-Cholecalciferol (CALCIUM + D3 PO), and Livalo.  Meds ordered this encounter  Medications  . levothyroxine (SYNTHROID) 112 MCG tablet    Sig: Take 1 tablet (112 mcg total) by mouth daily.    Dispense:  90 tablet    Refill:  1     Follow-up: Return in about 3 months (around 09/05/2019).  Scarlette Calico, MD

## 2019-06-06 NOTE — Progress Notes (Addendum)
Subjective:   Keith [name] is a 79 y.o. male who presents for Medicare Annual/Subsequent preventive examination.  Review of Systems:  No ROS Medicare Wellness Visit Cardiac Risk Factors include: advanced age (>36men, >31 women);dyslipidemia;male gender Sleep Patterns: No issues with falling asleep; feels rested on waking after 8-10 hours of sleep. Home Safety/Smoke Alarms: Feels safe in home; Smoke alarms in place. Living environment: Lives in a 1-story home with wife; has 1 son living. Seat Belt Safety/Bike Helmet: Wears seat belt.    Objective:    Vitals: BP 116/80 (BP Location: Right Arm, Patient Position: Sitting, Cuff Size: Normal)   Pulse 60   Temp 98.4 F (36.9 C)   Resp 16   Ht 5\' 10"  (1.778 m)   Wt 196 lb 12.8 oz (89.3 kg)   SpO2 96%   BMI 28.24 kg/m   Body mass index is 28.24 kg/m.  Advanced Directives 06/06/2019 11/29/2017 11/27/2017  Does Patient Have a Medical Advance Directive? Yes No No  Type of Advance Directive Living will;Healthcare Power of Attorney - -  Does patient want to make changes to medical advance directive? No - Patient declined - -  Copy of [place] in Chart? No - copy requested - -  Would patient like information on creating a medical advance directive? - No - Patient declined No - Patient declined    Tobacco Social History   Tobacco Use  Smoking Status Never Smoker  Smokeless Tobacco Never Used     Counseling given: No   Clinical Intake:  Pre-visit preparation completed: Yes  Pain : No/denies pain Pain Score: 0-No pain     BMI - recorded: 28.2 Nutritional Status: BMI 25 -29 Overweight Nutritional Risks: None Diabetes: No  How often do you need to have someone help you when you read instructions, pamphlets, or other written materials from your doctor or pharmacy?: 1 - Never What is the last grade level you completed in school?: 12th grade  Interpreter Needed?: No  Information entered by :: Davontae Prusinski  N. Lowell Guitar, LPN  Past Medical History:  Diagnosis Date  . Allergic rhinitis   . Cancer ([place])    SKIN CANCER ON LIP (30 YR AGO)  . DDD (degenerative disc disease), cervical   . Hyperlipidemia   . Hypothyroidism   . PONV (postoperative nausea and vomiting)    2016 BACK SURGERY, IN RECOVERY HE HAD N/V   Past Surgical History:  Procedure Laterality Date  . AMPUTATED Right 1961   TRAUMATIC AMPUTATED FINGERS RIGHT   . BACK SURGERY  2016  . TONSILLECTOMY    . TRANSFORAMINAL LUMBAR INTERBODY FUSION (TLIF) WITH PEDICLE SCREW FIXATION 1 LEVEL Right 11/29/2017   Procedure: RIGHT SIDED LUMBAR 4-5 TRANSFORAMINAL LUMBAR INTERBODY FUSION WITH INSTRUMENTATION AND ALLOGRAFT;  Surgeon: Phylliss Bob, MD;  Location: [facility];  Service: Orthopedics;  Laterality: Right;   Family History  Problem Relation Age of Onset  . Diabetes Mother   . Coronary artery disease Father        Fatal MI  . Diabetes Father   . Coronary artery disease Other   . Breast cancer Other    Social History   Socioeconomic History  . Marital status: Married    Spouse name: Not on file  . Number of children: Not on file  . Years of education: Not on file  . Highest education level: Not on file  Occupational History  . 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  Occupation: Designer, industrial/product bus  Tobacco Use  . Smoking status: Never Smoker  . Smokeless tobacco: Never Used  Substance and Sexual Activity  . Alcohol use: No  . Drug use: No  . Sexual activity: Not Currently  Other Topics Concern  . Not on file  Social History Narrative   Regular Exercise -  YES         Social Determinants of Health   Financial Resource Strain:   . Difficulty of Paying Living Expenses:   Food Insecurity:   . Worried About Charity fundraiser in the Last Year:   . Arboriculturist in the Last Year:   Transportation Needs:   . Film/video editor (Medical):   Marland Kitchen Lack of Transportation (Non-Medical):     Physical Activity:   . Days of Exercise per Week:   . Minutes of Exercise per Session:   Stress:   . Feeling of Stress :   Social Connections:   . Frequency of Communication with Friends and Family:   . Frequency of Social Gatherings with Friends and Family:   . Attends Religious Services:   . Active Member of Clubs or Organizations:   . Attends Archivist Meetings:   Marland Kitchen Marital Status:     Outpatient Encounter Medications as of 06/06/2019  Medication Sig  . aspirin EC 81 MG tablet Take 81 mg by mouth daily.  . Calcium Carb-Cholecalciferol (CALCIUM + D3 PO) Take 1 tablet by mouth daily.  . ferrous sulfate 325 (65 FE) MG EC tablet Take 325 mg by mouth daily.  Marland Kitchen levothyroxine (SYNTHROID) 100 MCG tablet Take 1 tablet (100 mcg total) by mouth daily before breakfast.  . Pitavastatin Calcium (LIVALO) 1 MG TABS TAKE 1 TABLET(1 MG) BY MOUTH DAILY   No facility-administered encounter medications on file as of 06/06/2019.    Activities of Daily Living In your present state of health, do you have any difficulty performing the following activities: 06/06/2019  Hearing? N  Vision? N  Difficulty concentrating or making decisions? N  Walking or climbing stairs? N  Dressing or bathing? N  Doing errands, shopping? N  Preparing Food and eating ? N  Using the Toilet? N  In the past six months, have you accidently leaked urine? N  Do you have problems with loss of bowel control? N  Managing your Medications? N  Managing your Finances? N  Housekeeping or managing your Housekeeping? N  Some recent data might be hidden    Patient Care Team: Janith Lima, MD as PCP - General   Assessment:   This is a routine wellness examination for Keith [name].  Exercise Activities and Dietary recommendations Current Exercise Habits: The patient does not participate in regular exercise at present(Takes care of wife who has dementia), Exercise limited by: None identified  Goals    . Client  understands the importance of follow-up with providers by attending scheduled visits    . Patient Stated     To stay active and healthy.       Fall Risk Fall Risk  06/06/2019 11/05/2018 01/01/2018 12/06/2016 01/14/2016  Falls in the past year? 0 0 0 No No  Comment - - - - Emmi Telephone Survey: data to providers prior to load  Number falls in past yr: 0 0 0 - -  Injury with Fall? 0 0 0 - -  Risk for fall due to : No Fall Risks - - - -  Follow up Falls evaluation completed;Education  provided;Falls prevention discussed Falls evaluation completed Falls evaluation completed - -   Is the patient's home free of loose throw rugs in walkways, pet beds, electrical cords, etc?   yes      Grab bars in the bathroom? yes      Handrails on the stairs?   yes      Adequate lighting?   yes   Depression Screen PHQ 2/9 Scores 06/06/2019 11/05/2018 01/01/2018 12/06/2016  PHQ - 2 Score 0 0 0 4  PHQ- 9 Score - - - 7    Cognitive Function        Immunization History  Administered Date(s) Administered  . Fluad Quad(high Dose 65+) 11/05/2018  . Influenza Split 12/29/2010, 11/09/2011, 11/21/2013  . Influenza Whole 10/06/2009  . Influenza, High Dose Seasonal PF 11/07/2016, 01/01/2018  . Influenza,inj,Quad PF,6+ Mos 10/23/2012, 11/04/2014, 10/01/2015  . Moderna SARS-COVID-2 Vaccination 03/25/2019, 04/23/2019  . Pneumococcal Conjugate-13 03/07/2014  . Pneumococcal Polysaccharide-23 10/23/2012  . Tdap 12/29/2010    Screening Tests Health Maintenance  Topic Date Due  . INFLUENZA VACCINE  09/08/2019  . TETANUS/TDAP  12/28/2020  . COVID-19 Vaccine  Completed  . PNA vac Low Risk Adult  Completed   Cancer Screenings: Lung: Low Dose CT Chest recommended if Age 56-80 years, 30 pack-year currently smoking OR have quit w/in 15years. Patient does not qualify. Colorectal: Yes       Plan:     Reviewed health maintenance screenings with patient today and relevant education, vaccines, and/or referrals  were provided.    Continue doing brain stimulating activities (puzzles, reading, adult coloring books, staying active) to keep memory sharp.    Continue to eat heart healthy diet (full of fruits, vegetables, whole grains, lean protein, water--limit salt, fat, and sugar intake) and increase physical activity as tolerated.  I have personally reviewed and noted the following in the patient's chart:   . Medical and social history . Use of alcohol, tobacco or illicit drugs  . Current medications and supplements . Functional ability and status . Nutritional status . Physical activity . Advanced directives . List of other physicians . Hospitalizations, surgeries, and ER visits in previous 12 months . Vitals . Screenings to include cognitive, depression, and falls . Referrals and appointments  In addition, I have reviewed and discussed with patient certain preventive protocols, quality metrics, and best practice recommendations. A written personalized care plan for preventive services as well as general preventive health recommendations were provided to patient.     Sheral Flow, LPN  [id number] Nurse Health Advisor  Medical screening examination/treatment/procedure(s) were performed by non-physician practitioner and as supervising physician I was immediately available for consultation/collaboration. I agree with above. Scarlette Calico, MD

## 2019-06-06 NOTE — Patient Instructions (Addendum)
Keith [name] , Thank you for taking time to come for your Medicare Wellness Visit. I appreciate your ongoing commitment to your health goals. Please review the following plan we discussed and let me know if I can assist you in the future.   Screening recommendations/referrals: Colorectal Screening: 02/23/2010  Vision and Dental Exams: Recommended annual ophthalmology exams for early detection of glaucoma and other disorders of the eye Recommended annual dental exams for proper oral hygiene  Vaccinations: Influenza vaccine: 11/05/2018 Pneumococcal vaccine: completed; Pneumovax 10/23/2012, Prevnar 03/07/2014 Tdap vaccine: 12/29/2010; due every 10 years Shingles vaccine: completed; received at [facility] Vaccine: completed; Moderna 03/25/2019, 04/23/2019  Advanced directives: Advance directives discussed with you today. Please bring a copy of your POA (Power of [place]) and/or Living Will to your next appointment.  Goals:  Recommend to drink at least 6-8 8oz glasses of water per day.  Recommend to exercise for at least 150 minutes per week.  Recommend to remove any items from the home that may cause slips or trips.  Recommend to decrease portion sizes by eating 3 small healthy meals and at least 2 healthy snacks per day.  Recommend to begin DASH diet as directed below  Recommend to continue efforts to reduce smoking habits until no longer smoking. Smoking Cessation literature is attached below.  Next appointment: Please schedule your Annual Wellness Visit with your Nurse Health Advisor in one year.  Preventive Care 50 Years and Older, Male Preventive care refers to lifestyle choices and visits with your health care provider that can promote health and wellness. What does preventive care include?  A yearly physical exam. This is also called an annual well check.  Dental exams once or twice a year.  Routine eye exams. Ask your health care provider how often you should have your eyes  checked.  Personal lifestyle choices, including:  Daily care of your teeth and gums.  Regular physical activity.  Eating a healthy diet.  Avoiding tobacco and drug use.  Limiting alcohol use.  Practicing safe sex.  Taking low doses of aspirin every day if recommended by your health care provider..  Taking vitamin and mineral supplements as recommended by your health care provider. What happens during an annual well check? The services and screenings done by your health care provider during your annual well check will depend on your age, overall health, lifestyle risk factors, and family history of disease. Counseling  Your health care provider may ask you questions about your:  Alcohol use.  Tobacco use.  Drug use.  Emotional well-being.  Home and relationship well-being.  Sexual activity.  Eating habits.  History of falls.  Memory and ability to understand (cognition).  Work and work Statistician. Screening  You may have the following tests or measurements:  Height, weight, and BMI.  Blood pressure.  Lipid and cholesterol levels. These may be checked every 5 years, or more frequently if you are over 70 years old.  Skin check.  Lung cancer screening. You may have this screening every year starting at age 20 if you have a 30-pack-year history of smoking and currently smoke or have quit within the past 15 years.  Fecal occult blood test (FOBT) of the stool. You may have this test every year starting at age 42.  Flexible sigmoidoscopy or colonoscopy. You may have a sigmoidoscopy every 5 years or a colonoscopy every 10 years starting at age 62.  Prostate cancer screening. Recommendations will vary depending on your family history and other risks.  Hepatitis C blood test.  Hepatitis B blood test.  Sexually transmitted disease (STD) testing.  Diabetes screening. This is done by checking your blood sugar (glucose) after you have not eaten for a while  (fasting). You may have this done every 1-3 years.  Abdominal aortic aneurysm (AAA) screening. You may need this if you are a current or former smoker.  Osteoporosis. You may be screened starting at age 48 if you are at high risk. Talk with your health care provider about your test results, treatment options, and if necessary, the need for more tests. Vaccines  Your health care provider may recommend certain vaccines, such as:  Influenza vaccine. This is recommended every year.  Tetanus, diphtheria, and acellular pertussis (Tdap, Td) vaccine. You may need a Td booster every 10 years.  Zoster vaccine. You may need this after age 36.  Pneumococcal 13-valent conjugate (PCV13) vaccine. One dose is recommended after age 92.  Pneumococcal polysaccharide (PPSV23) vaccine. One dose is recommended after age 22. Talk to your health care provider about which screenings and vaccines you need and how often you need them. This information is not intended to replace advice given to you by your health care provider. Make sure you discuss any questions you have with your health care provider. Document Released: 02/20/2015 Document Revised: 10/14/2015 Document Reviewed: 11/25/2014 Elsevier Interactive Patient Education  2017 [place] Prevention in the Home Falls can cause injuries. They can happen to people of all ages. There are many things you can do to make your home safe and to help prevent falls. What can I do on the outside of my home?  Regularly fix the edges of walkways and driveways and fix any cracks.  Remove anything that might make you trip as you walk through a door, such as a raised step or threshold.  Trim any bushes or trees on the path to your home.  Use bright outdoor lighting.  Clear any walking paths of anything that might make someone trip, such as rocks or tools.  Regularly check to see if handrails are loose or broken. Make sure that both sides of any steps have  handrails.  Any raised decks and porches should have guardrails on the edges.  Have any leaves, snow, or ice cleared regularly.  Use sand or salt on walking paths during winter.  Clean up any spills in your garage right away. This includes oil or grease spills. What can I do in the bathroom?  Use night lights.  Install grab bars by the toilet and in the tub and shower. Do not use towel bars as grab bars.  Use non-skid mats or decals in the tub or shower.  If you need to sit down in the shower, use a plastic, non-slip stool.  Keep the floor dry. Clean up any water that spills on the floor as soon as it happens.  Remove soap buildup in the tub or shower regularly.  Attach bath mats securely with double-sided non-slip rug tape.  Do not have throw rugs and other things on the floor that can make you trip. What can I do in the bedroom?  Use night lights.  Make sure that you have a light by your bed that is easy to reach.  Do not use any sheets or blankets that are too big for your bed. They should not hang down onto the floor.  Have a firm chair that has side arms. You can use this for support while you  get dressed.  Do not have throw rugs and other things on the floor that can make you trip. What can I do in the kitchen?  Clean up any spills right away.  Avoid walking on wet floors.  Keep items that you use a lot in easy-to-reach places.  If you need to reach something above you, use a strong step stool that has a grab bar.  Keep electrical cords out of the way.  Do not use floor polish or wax that makes floors slippery. If you must use wax, use non-skid floor wax.  Do not have throw rugs and other things on the floor that can make you trip. What can I do with my stairs?  Do not leave any items on the stairs.  Make sure that there are handrails on both sides of the stairs and use them. Fix handrails that are broken or loose. Make sure that handrails are as long as  the stairways.  Check any carpeting to make sure that it is firmly attached to the stairs. Fix any carpet that is loose or worn.  Avoid having throw rugs at the top or bottom of the stairs. If you do have throw rugs, attach them to the floor with carpet tape.  Make sure that you have a light switch at the top of the stairs and the bottom of the stairs. If you do not have them, ask someone to add them for you. What else can I do to help prevent falls?  Wear shoes that:  Do not have high heels.  Have rubber bottoms.  Are comfortable and fit you well.  Are closed at the toe. Do not wear sandals.  If you use a stepladder:  Make sure that it is fully opened. Do not climb a closed stepladder.  Make sure that both sides of the stepladder are locked into place.  Ask someone to hold it for you, if possible.  Clearly mark and make sure that you can see:  Any grab bars or handrails.  First and last steps.  Where the edge of each step is.  Use tools that help you move around (mobility aids) if they are needed. These include:  Canes.  Walkers.  Scooters.  Crutches.  Turn on the lights when you go into a dark area. Replace any light bulbs as soon as they burn out.  Set up your furniture so you have a clear path. Avoid moving your furniture around.  If any of your floors are uneven, fix them.  If there are any pets around you, be aware of where they are.  Review your medicines with your doctor. Some medicines can make you feel dizzy. This can increase your chance of falling. Ask your doctor what other things that you can do to help prevent falls. This information is not intended to replace advice given to you by your health care provider. Make sure you discuss any questions you have with your health care provider. Document Released: 11/20/2008 Document Revised: 07/02/2015 Document Reviewed: 02/28/2014 Elsevier Interactive Patient Education  2017 [employer].

## 2019-06-07 ENCOUNTER — Encounter: Payer: Self-pay | Admitting: Internal Medicine

## 2019-06-07 MED ORDER — LEVOTHYROXINE SODIUM 112 MCG PO TABS: 112.0000 ug | ORAL_TABLET | Freq: Every day | ORAL | 1 refills | Status: DC

## 2019-06-07 NOTE — Patient Instructions (Signed)
Hypothyroidism  Hypothyroidism is when the thyroid gland does not make enough of certain hormones (it is underactive). The thyroid gland is a small gland located in the lower front part of the neck, just in front of the windpipe (trachea). This gland makes hormones that help control how the body uses food for energy (metabolism) as well as how the heart and brain function. These hormones also play a role in keeping your bones strong. When the thyroid is underactive, it produces too little of the hormones thyroxine (T4) and triiodothyronine (T3). What are the causes? This condition may be caused by:  Hashimoto's disease. This is a disease in which the body's disease-fighting system (immune system) attacks the thyroid gland. This is the most common cause.  Viral infections.  Pregnancy.  Certain medicines.  Birth defects.  Past radiation treatments to the head or neck for cancer.  Past treatment with radioactive iodine.  Past exposure to radiation in the environment.  Past surgical removal of part or all of the thyroid.  Problems with a gland in the center of the brain (pituitary gland).  Lack of enough iodine in the diet. What increases the risk? You are more likely to develop this condition if:  You are male.  You have a family history of thyroid conditions.  You use a medicine called lithium.  You take medicines that affect the immune system (immunosuppressants). What are the signs or symptoms? Symptoms of this condition include:  Feeling as though you have no energy (lethargy).  Not being able to tolerate cold.  Weight gain that is not explained by a change in diet or exercise habits.  Lack of appetite.  Dry skin.  Coarse hair.  Menstrual irregularity.  Slowing of thought processes.  Constipation.  Sadness or depression. How is this diagnosed? This condition may be diagnosed based on:  Your symptoms, your medical history, and a physical exam.  Blood  tests. You may also have imaging tests, such as an ultrasound or MRI. How is this treated? This condition is treated with medicine that replaces the thyroid hormones that your body does not make. After you begin treatment, it may take several weeks for symptoms to go away. Follow these instructions at home:  Take over-the-counter and prescription medicines only as told by your health care provider.  If you start taking any new medicines, tell your health care provider.  Keep all follow-up visits as told by your health care provider. This is important. ? As your condition improves, your dosage of thyroid hormone medicine may change. ? You will need to have blood tests regularly so that your health care provider can monitor your condition. Contact a health care provider if:  Your symptoms do not get better with treatment.  You are taking thyroid replacement medicine and you: ? Sweat a lot. ? Have tremors. ? Feel anxious. ? Lose weight rapidly. ? Cannot tolerate heat. ? Have emotional swings. ? Have diarrhea. ? Feel weak. Get help right away if you have:  Chest pain.  An irregular heartbeat.  A rapid heartbeat.  Difficulty breathing. Summary  Hypothyroidism is when the thyroid gland does not make enough of certain hormones (it is underactive).  When the thyroid is underactive, it produces too little of the hormones thyroxine (T4) and triiodothyronine (T3).  The most common cause is Hashimoto's disease, a disease in which the body's disease-fighting system (immune system) attacks the thyroid gland. The condition can also be caused by viral infections, medicine, pregnancy, or past   radiation treatment to the head or neck.  Symptoms may include weight gain, dry skin, constipation, feeling as though you do not have energy, and not being able to tolerate cold.  This condition is treated with medicine to replace the thyroid hormones that your body does not make. This information  is not intended to replace advice given to you by your health care provider. Make sure you discuss any questions you have with your health care provider. Document Revised: 01/06/2017 Document Reviewed: 01/04/2017 Elsevier Patient Education  2020 Elsevier Inc.  

## 2019-06-14 ENCOUNTER — Telehealth: Payer: Self-pay | Admitting: Internal Medicine

## 2019-06-14 NOTE — Telephone Encounter (Signed)
140/93 was the last reading that patient had.   Pt stated he has had some feelings of indigestion yesterday but states that he feels better regarding that.   Pt also stated that he had some dizzy spells today and he has had a slight headache.   I advise pt that he should go to ED. Pt stated that if continues he will. I informed pt that going now is the recommended course of action but especially if the symptoms worsen or if he develops CP, SOB or DOE.  Pt stated understanding and agreed.

## 2019-06-14 NOTE — Telephone Encounter (Signed)
New message:    Pt is calling and states he thinks his BP is running a little high today. pt would like a call to discuss what his BP normally is when he comes into the office. Please advise.

## 2019-06-20 ENCOUNTER — Encounter: Payer: Self-pay | Admitting: Internal Medicine

## 2019-06-20 ENCOUNTER — Ambulatory Visit: Payer: Medicare PPO | Admitting: Internal Medicine

## 2019-06-20 ENCOUNTER — Other Ambulatory Visit: Payer: Self-pay

## 2019-06-20 VITALS — BP 120/86 | HR 86 | Temp 98.6°F | Ht 70.0 in | Wt 196.0 lb

## 2019-06-20 DIAGNOSIS — R030 Elevated blood-pressure reading, without diagnosis of hypertension: Secondary | ICD-10-CM | POA: Diagnosis not present

## 2019-06-20 DIAGNOSIS — J309 Allergic rhinitis, unspecified: Secondary | ICD-10-CM

## 2019-06-20 DIAGNOSIS — R42 Dizziness and giddiness: Secondary | ICD-10-CM | POA: Diagnosis not present

## 2019-06-20 MED ORDER — MECLIZINE HCL 12.5 MG PO TABS: 12.5000 mg | ORAL_TABLET | Freq: Three times a day (TID) | ORAL | 2 refills | Status: DC | PRN

## 2019-06-20 NOTE — Patient Instructions (Addendum)
Please take all new medication as prescribed - the meclizine as needed for vertigo  Please continue all other medications as before, including your treatment for the allergies  Please have the pharmacy call with any other refills you may need.  Please keep your appointments with your specialists as you may have planned

## 2019-06-20 NOTE — Progress Notes (Signed)
Subjective:    Patient ID: Keith [name], male    DOB: 1941/01/02, 79 y.o.   MRN: [id number]  HPI  Here to f/u; overall doing ok,  Pt denies chest pain, increasing sob or doe, wheezing, orthopnea, PND, increased LE swelling, palpitations, or syncope but has recurring vertigo sensation with head movement and body position change.  Pt denies new neurological symptoms such as new headache, or facial or extremity weakness or numbness.  Pt denies polydipsia, polyuria, or low sugar episode.  Pt states overall good compliance with meds, Does have several wks ongoing nasal allergy symptoms with clearish congestion, itch and sneezing, without fever, pain, ST, cough, swelling or wheezing.  BP AT home has been mostly < 140/90 Past Medical History:  Diagnosis Date  . Allergic rhinitis   . Cancer ([place])    SKIN CANCER ON LIP (30 YR AGO)  . DDD (degenerative disc disease), cervical   . Hyperlipidemia   . Hypothyroidism   . PONV (postoperative nausea and vomiting)    2016 BACK SURGERY, IN RECOVERY HE HAD N/V   Past Surgical History:  Procedure Laterality Date  . AMPUTATED Right 1961   TRAUMATIC AMPUTATED FINGERS RIGHT   . BACK SURGERY  2016  . TONSILLECTOMY    . TRANSFORAMINAL LUMBAR INTERBODY FUSION (TLIF) WITH PEDICLE SCREW FIXATION 1 LEVEL Right 11/29/2017   Procedure: RIGHT SIDED LUMBAR 4-5 TRANSFORAMINAL LUMBAR INTERBODY FUSION WITH INSTRUMENTATION AND ALLOGRAFT;  Surgeon: Phylliss Bob, MD;  Location: [place];  Service: Orthopedics;  Laterality: Right;    reports that he has never smoked. He has never used smokeless tobacco. He reports that he does not drink alcohol or use drugs. family history includes Breast cancer in an other family member; Coronary artery disease in his father and another family member; Diabetes in his father and mother. Allergies  Allergen Reactions  . Atorvastatin Other (See Comments)    leg cramps  . Rosuvastatin Other (See Comments)    myalgias   Current Outpatient  Medications on File Prior to Visit  Medication Sig Dispense Refill  . aspirin EC 81 MG tablet Take 81 mg by mouth daily.    . Calcium Carb-Cholecalciferol (CALCIUM + D3 PO) Take 1 tablet by mouth daily.    . ferrous sulfate 325 (65 FE) MG EC tablet Take 325 mg by mouth daily.    Marland Kitchen levothyroxine (SYNTHROID) 112 MCG tablet Take 1 tablet (112 mcg total) by mouth daily. 90 tablet 1  . Pitavastatin Calcium (LIVALO) 1 MG TABS TAKE 1 TABLET(1 MG) BY MOUTH DAILY 90 tablet 1   No current facility-administered medications on file prior to visit.   Review of Systems All otherwise neg per pt     Objective:   Physical Exam BP 120/86 (BP Location: Left Arm, Patient Position: Sitting, Cuff Size: Large)   Pulse 86   Temp 98.6 F (37 C) (Oral)   Ht 5\' 10"  (1.778 m)   Wt 196 lb (88.9 kg)   SpO2 97%   BMI 28.12 kg/m  VS noted,  Constitutional: Pt appears in NAD HENT: Head: NCAT.  Right Ear: External ear normal.  Left Ear: External ear normal.  Bilat tm's with mild erythema.  Max sinus areas non tender.  Pharynx with mild erythema, no exudate Eyes: . Pupils are equal, round, and reactive to light. Conjunctivae and EOM are normal Nose: without d/c or deformity Neck: Neck supple. Gross normal ROM Cardiovascular: Normal rate and regular rhythm.   Pulmonary/Chest: Effort normal and breath  sounds without rales or wheezing.  Neurological: Pt is alert. At baseline orientation, motor grossly intact Skin: Skin is warm. No rashes, other new lesions, no LE edema Psychiatric: Pt behavior is normal without agitation  All otherwise neg per pt Lab Results  Component Value Date   WBC 9.3 11/05/2018   HGB 15.5 11/05/2018   HCT 46.9 11/05/2018   PLT 200.0 11/05/2018   GLUCOSE 94 11/05/2018   CHOL 158 11/05/2018   TRIG 147.0 11/05/2018   HDL 43.00 11/05/2018   LDLCALC 86 11/05/2018   ALT 11 01/01/2018   AST 14 01/01/2018   NA 141 11/05/2018   K 4.5 11/05/2018   CL 106 11/05/2018   CREATININE 1.09  11/05/2018   BUN 17 11/05/2018   CO2 27 11/05/2018   TSH 12.34 (H) 06/06/2019   PSA 3.66 11/05/2018   INR 1.01 11/27/2017   HGBA1C 5.9 01/01/2018          Assessment & Plan:

## 2019-06-22 ENCOUNTER — Encounter: Payer: Self-pay | Admitting: Internal Medicine

## 2019-06-22 DIAGNOSIS — R030 Elevated blood-pressure reading, without diagnosis of hypertension: Secondary | ICD-10-CM | POA: Insufficient documentation

## 2019-06-22 NOTE — Assessment & Plan Note (Signed)
Mild to mod, for nasal steroid restart,  to f/u any worsening symptoms or concerns

## 2019-06-22 NOTE — Assessment & Plan Note (Addendum)
C/w peripheral - for meclizine prn,  to f/u any worsening symptoms or concerns  I spent 31 minutes in preparing to see the patient by review of recent labs, imaging and procedures, obtaining and reviewing separately obtained history, communicating with the patient and family or caregiver, ordering medications, tests or procedures, and documenting clinical information in the EHR including the differential Dx, treatment, and any further evaluation and other management of vertigo, elev bP, and allergies

## 2019-06-22 NOTE — Assessment & Plan Note (Signed)
C/w reactive situational, cont to follow bp at home and next visit

## 2019-07-05 ENCOUNTER — Other Ambulatory Visit: Payer: Self-pay | Admitting: Internal Medicine

## 2019-07-05 DIAGNOSIS — E785 Hyperlipidemia, unspecified: Secondary | ICD-10-CM

## 2019-07-24 ENCOUNTER — Other Ambulatory Visit: Payer: Self-pay

## 2019-07-24 ENCOUNTER — Emergency Department (HOSPITAL_COMMUNITY): Payer: Medicare PPO

## 2019-07-24 ENCOUNTER — Encounter (HOSPITAL_COMMUNITY): Payer: Self-pay | Admitting: Emergency Medicine

## 2019-07-24 ENCOUNTER — Emergency Department (HOSPITAL_COMMUNITY)
Admission: EM | Admit: 2019-07-24 | Discharge: 2019-07-24 | Disposition: A | Discharge status: Home / Self Care | Payer: Medicare PPO | Attending: Emergency Medicine | Admitting: Emergency Medicine

## 2019-07-24 DIAGNOSIS — J680 Bronchitis and pneumonitis due to chemicals, gases, fumes and vapors: Secondary | ICD-10-CM | POA: Diagnosis not present

## 2019-07-24 DIAGNOSIS — R609 Edema, unspecified: Secondary | ICD-10-CM | POA: Diagnosis not present

## 2019-07-24 DIAGNOSIS — T528X1A Toxic effect of other organic solvents, accidental (unintentional), initial encounter: Secondary | ICD-10-CM | POA: Insufficient documentation

## 2019-07-24 DIAGNOSIS — R05 Cough: Secondary | ICD-10-CM | POA: Diagnosis not present

## 2019-07-24 DIAGNOSIS — R0609 Other forms of dyspnea: Secondary | ICD-10-CM | POA: Diagnosis present

## 2019-07-24 DIAGNOSIS — T5491XA Toxic effect of unspecified corrosive substance, accidental (unintentional), initial encounter: Secondary | ICD-10-CM | POA: Diagnosis not present

## 2019-07-24 DIAGNOSIS — R000 Tachycardia, unspecified: Secondary | ICD-10-CM | POA: Diagnosis not present

## 2019-07-24 MED ORDER — ALBUTEROL SULFATE HFA 108 (90 BASE) MCG/ACT IN AERS
2.0000 | INHALATION_SPRAY | Freq: Once | RESPIRATORY_TRACT | Status: AC
  Administered 2019-07-24: 2 via RESPIRATORY_TRACT
  Filled 2019-07-24: qty 6.7

## 2019-07-24 MED ORDER — BENZONATATE 100 MG PO CAPS: 100.0000 mg | ORAL_CAPSULE | Freq: Three times a day (TID) | ORAL | 0 refills | Status: DC

## 2019-07-24 MED ORDER — BENZONATATE 100 MG PO CAPS
100.0000 mg | ORAL_CAPSULE | Freq: Once | ORAL | Status: AC
  Administered 2019-07-24: 100 mg via ORAL
  Filled 2019-07-24: qty 1

## 2019-07-24 MED ORDER — AEROCHAMBER PLUS FLO-VU LARGE MISC
Status: AC
  Filled 2019-07-24: qty 1

## 2019-07-24 MED ORDER — AEROCHAMBER PLUS FLO-VU MEDIUM MISC
1.0000 | Freq: Once | Status: DC
  Filled 2019-07-24: qty 1

## 2019-07-24 NOTE — Discharge Instructions (Addendum)
You were evaluated in the Emergency Department and after careful evaluation, we did not find any emergent condition requiring admission or further testing in the hospital.  Your exam/testing today was overall reassuring.  Your symptoms seem to be due to some inflammation of the lungs from the fumes today.  You should slowly get better with time.  You can use the inhaler provided every 3-4 hours as needed.  You can also use the Tessalon anticough medication as needed.  Please return to the Emergency Department if you experience any worsening of your condition.  We encourage you to follow up with a primary care provider.  Thank you for allowing [place] to be a part of your care.

## 2019-07-24 NOTE — ED Provider Notes (Signed)
[facility] Emergency Department Provider Note MRN:  [id number]  Arrival date & time: 07/24/19     Chief Complaint   Chemical Inhalation   History of Present Illness   Keith [name] is a 79 y.o. year-old male with a history of hyperlipidemia presenting to the ED with chief complaint of chemical inhalation.  Patient was cleaning the kitchen floor and he used bleach and ammonia.  He thinks he used too much and the fumes began to irritate him.  Has been having coughing fits since 10 AM, every 20 minutes he coughs a lot.  Denies any chest pain or shortness of breath, no falls, no headache or vision change, no eye pain, no abdominal pain, no nausea or vomiting.  Symptoms intermittent, mild to moderate in severity.  Review of Systems  A complete 10 system review of systems was obtained and all systems are negative except as noted in the HPI and PMH.   Patient's Health History    Past Medical History:  Diagnosis Date  . Allergic rhinitis   . Cancer ([place])    SKIN CANCER ON LIP (30 YR AGO)  . DDD (degenerative disc disease), cervical   . Hyperlipidemia   . Hypothyroidism   . PONV (postoperative nausea and vomiting)    2016 BACK SURGERY, IN RECOVERY HE HAD N/V    Past Surgical History:  Procedure Laterality Date  . AMPUTATED Right 1961   TRAUMATIC AMPUTATED FINGERS RIGHT   . BACK SURGERY  2016  . TONSILLECTOMY    . TRANSFORAMINAL LUMBAR INTERBODY FUSION (TLIF) WITH PEDICLE SCREW FIXATION 1 LEVEL Right 11/29/2017   Procedure: RIGHT SIDED LUMBAR 4-5 TRANSFORAMINAL LUMBAR INTERBODY FUSION WITH INSTRUMENTATION AND ALLOGRAFT;  Surgeon: Phylliss Bob, MD;  Location: [place];  Service: Orthopedics;  Laterality: Right;    Family History  Problem Relation Age of Onset  . Diabetes Mother   . Coronary artery disease Father        Fatal MI  . Diabetes Father   . Coronary artery disease Other   . Breast cancer Other     Social History   Socioeconomic History  .  Marital status: Married    Spouse name: Not on file  . Number of children: Not on file  . Years of education: Not on file  . Highest education level: Not on file  Occupational History  . 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 Occupation: Designer, industrial/product bus  Tobacco Use  . Smoking status: Never Smoker  . Smokeless tobacco: Never Used  Vaping Use  . Vaping Use: Never used  Substance and Sexual Activity  . Alcohol use: No  . Drug use: No  . Sexual activity: Not Currently  Other Topics Concern  . Not on file  Social History Narrative   Regular Exercise -  YES         Social Determinants of Health   Financial Resource Strain:   . Difficulty of Paying Living Expenses:   Food Insecurity:   . Worried About Charity fundraiser in the Last Year:   . Arboriculturist in the Last Year:   Transportation Needs:   . Film/video editor (Medical):   Marland Kitchen Lack of Transportation (Non-Medical):   Physical Activity:   . Days of Exercise per Week:   . Minutes of Exercise per Session:   Stress:   . Feeling of Stress :   Social Connections:   . Frequency of  Communication with Friends and Family:   . Frequency of Social Gatherings with Friends and Family:   . Attends Religious Services:   . Active Member of Clubs or Organizations:   . Attends Archivist Meetings:   Marland Kitchen Marital Status:   Intimate Partner Violence:   . Fear of Current or Ex-Partner:   . Emotionally Abused:   Marland Kitchen Physically Abused:   . Sexually Abused:      Physical Exam   Vitals:   07/24/19 1614 07/24/19 1629  BP: 107/81   Pulse: 61 64  Resp: 20 12  Temp:    SpO2: 99% 99%    CONSTITUTIONAL: Well-appearing, NAD NEURO:  Alert and oriented x 3, no focal deficits EYES:  eyes equal and reactive ENT/NECK:  no LAD, no JVD CARDIO: Regular rate, well-perfused, normal S1 and S2 PULM:  CTAB no wheezing or rhonchi, no increased work of breathing, intermittently coughing GI/GU:  normal  bowel sounds, non-distended, non-tender MSK/SPINE:  No gross deformities, no edema SKIN:  no rash, atraumatic PSYCH:  Appropriate speech and behavior  *Additional and/or pertinent findings included in MDM below  Diagnostic and Interventional Summary    EKG Interpretation  Date/Time:    Ventricular Rate:    PR Interval:    QRS Duration:   QT Interval:    QTC Calculation:   R Axis:     Text Interpretation:        Labs Reviewed - No data to display  DG Chest Port 1 View  Final Result      Medications  AeroChamber Plus Flo-Vu Medium MISC 1 each (has no administration in time range)  albuterol (VENTOLIN HFA) 108 (90 Base) MCG/ACT inhaler 2 puff (2 puffs Inhalation Given 07/24/19 1629)  benzonatate (TESSALON) capsule 100 mg (100 mg Oral Given 07/24/19 1629)     Procedures  /  Critical Care Procedures  ED Course and Medical Decision Making  I have reviewed the triage vital signs, the nursing notes, and pertinent available records from the EMR.  Listed above are laboratory and imaging tests that I personally ordered, reviewed, and interpreted and then considered in my medical decision making (see below for details).      Persistent coughing spells due to chemical irritant, likely chloramine gas which is released when mixing bleach and ammonia.  I spoke with poison control, who is recommending chest x-ray to evaluate for signs of inhalation pneumonitis.  The rest of this is symptomatic management, will provide albuterol, Tessalon, reassess.  Patient is with reassuring vital signs, no increased work of breathing, lungs largely clear.  X-ray showing some evidence of pneumonitis.  Patient continues to look and feel well, states that the albuterol inhaler helped.  Appropriate for discharge and continued symptomatic management at home, strict return precautions.  Barth Kirks. [place] Small, [place] [email or web address]  Final Clinical  Impressions(s) / ED Diagnoses     ICD-10-CM   1. Pneumonitis due to fumes and vapors ([place])  J68.0     ED Discharge Orders         Ordered    benzonatate (TESSALON) 100 MG capsule  Every 8 hours     Discontinue  Reprint     07/24/19 1657           Discharge Instructions Discussed with and Provided to Patient:     Discharge Instructions     You were evaluated in the Emergency Department and after careful evaluation, we did not find  any emergent condition requiring admission or further testing in the hospital.  Your exam/testing today was overall reassuring.  Your symptoms seem to be due to some inflammation of the lungs from the fumes today.  You should slowly get better with time.  You can use the inhaler provided every 3-4 hours as needed.  You can also use the Tessalon anticough medication as needed.  Please return to the Emergency Department if you experience any worsening of your condition.  We encourage you to follow up with a primary care provider.  Thank you for allowing [place] to be a part of your care.        Maudie Flakes, MD 07/24/19 1659

## 2019-07-24 NOTE — ED Triage Notes (Signed)
-  PT present from home by EMS he was cleaning about 10am today -He reports mixed ammonia and bleach to clean the floor -he has since has productive cough every 21mins -Denies Chest pain, denies N/V, denies eye irritation -Denies [facility] Alert on arrival, Self health historian

## 2019-07-24 NOTE — ED Notes (Signed)
Patient verbalizes understanding of discharge instructions. Opportunity for questioning and answers were provided. Armband removed by staff, pt discharged from ED.  

## 2019-08-13 ENCOUNTER — Other Ambulatory Visit: Payer: Self-pay

## 2019-08-13 ENCOUNTER — Telehealth: Payer: Self-pay | Admitting: Family Medicine

## 2019-08-13 DIAGNOSIS — M47818 Spondylosis without myelopathy or radiculopathy, sacral and sacrococcygeal region: Secondary | ICD-10-CM

## 2019-08-13 NOTE — Telephone Encounter (Signed)
Patient called requesting an order for another Epidural if possible.  Please advise.

## 2019-08-13 NOTE — Telephone Encounter (Signed)
Order placed and patient notified 

## 2019-08-20 ENCOUNTER — Other Ambulatory Visit: Payer: Self-pay

## 2019-08-20 ENCOUNTER — Ambulatory Visit
Admission: RE | Admit: 2019-08-20 | Discharge: 2019-08-20 | Disposition: A | Discharge status: Home / Self Care | Payer: Medicare PPO | Source: Ambulatory Visit | Attending: Family Medicine | Admitting: Family Medicine

## 2019-08-20 DIAGNOSIS — M47818 Spondylosis without myelopathy or radiculopathy, sacral and sacrococcygeal region: Secondary | ICD-10-CM

## 2019-08-20 DIAGNOSIS — M5416 Radiculopathy, lumbar region: Secondary | ICD-10-CM | POA: Diagnosis not present

## 2019-08-20 MED ORDER — IOPAMIDOL (ISOVUE-M 200) INJECTION 41%
1.0000 mL | Freq: Once | INTRAMUSCULAR | Status: AC
  Administered 2019-08-20: 1 mL via EPIDURAL

## 2019-08-20 MED ORDER — METHYLPREDNISOLONE ACETATE 40 MG/ML INJ SUSP (RADIOLOG
120.0000 mg | Freq: Once | INTRAMUSCULAR | Status: AC
  Administered 2019-08-20: 120 mg via EPIDURAL

## 2019-08-20 NOTE — Discharge Instructions (Signed)

## 2019-09-04 ENCOUNTER — Encounter: Payer: Self-pay | Admitting: Internal Medicine

## 2019-09-04 ENCOUNTER — Other Ambulatory Visit: Payer: Self-pay

## 2019-09-04 ENCOUNTER — Ambulatory Visit: Payer: Medicare PPO | Admitting: Internal Medicine

## 2019-09-04 VITALS — BP 130/78 | HR 67 | Temp 98.3°F | Ht 70.0 in | Wt 196.4 lb

## 2019-09-04 DIAGNOSIS — E785 Hyperlipidemia, unspecified: Secondary | ICD-10-CM

## 2019-09-04 DIAGNOSIS — N402 Nodular prostate without lower urinary tract symptoms: Secondary | ICD-10-CM | POA: Diagnosis not present

## 2019-09-04 DIAGNOSIS — E039 Hypothyroidism, unspecified: Secondary | ICD-10-CM | POA: Diagnosis not present

## 2019-09-04 NOTE — Patient Instructions (Signed)
Hypothyroidism  Hypothyroidism is when the thyroid gland does not make enough of certain hormones (it is underactive). The thyroid gland is a small gland located in the lower front part of the neck, just in front of the windpipe (trachea). This gland makes hormones that help control how the body uses food for energy (metabolism) as well as how the heart and brain function. These hormones also play a role in keeping your bones strong. When the thyroid is underactive, it produces too little of the hormones thyroxine (T4) and triiodothyronine (T3). What are the causes? This condition may be caused by:  Hashimoto's disease. This is a disease in which the body's disease-fighting system (immune system) attacks the thyroid gland. This is the most common cause.  Viral infections.  Pregnancy.  Certain medicines.  Birth defects.  Past radiation treatments to the head or neck for cancer.  Past treatment with radioactive iodine.  Past exposure to radiation in the environment.  Past surgical removal of part or all of the thyroid.  Problems with a gland in the center of the brain (pituitary gland).  Lack of enough iodine in the diet. What increases the risk? You are more likely to develop this condition if:  You are male.  You have a family history of thyroid conditions.  You use a medicine called lithium.  You take medicines that affect the immune system (immunosuppressants). What are the signs or symptoms? Symptoms of this condition include:  Feeling as though you have no energy (lethargy).  Not being able to tolerate cold.  Weight gain that is not explained by a change in diet or exercise habits.  Lack of appetite.  Dry skin.  Coarse hair.  Menstrual irregularity.  Slowing of thought processes.  Constipation.  Sadness or depression. How is this diagnosed? This condition may be diagnosed based on:  Your symptoms, your medical history, and a physical exam.  Blood  tests. You may also have imaging tests, such as an ultrasound or MRI. How is this treated? This condition is treated with medicine that replaces the thyroid hormones that your body does not make. After you begin treatment, it may take several weeks for symptoms to go away. Follow these instructions at home:  Take over-the-counter and prescription medicines only as told by your health care provider.  If you start taking any new medicines, tell your health care provider.  Keep all follow-up visits as told by your health care provider. This is important. ? As your condition improves, your dosage of thyroid hormone medicine may change. ? You will need to have blood tests regularly so that your health care provider can monitor your condition. Contact a health care provider if:  Your symptoms do not get better with treatment.  You are taking thyroid replacement medicine and you: ? Sweat a lot. ? Have tremors. ? Feel anxious. ? Lose weight rapidly. ? Cannot tolerate heat. ? Have emotional swings. ? Have diarrhea. ? Feel weak. Get help right away if you have:  Chest pain.  An irregular heartbeat.  A rapid heartbeat.  Difficulty breathing. Summary  Hypothyroidism is when the thyroid gland does not make enough of certain hormones (it is underactive).  When the thyroid is underactive, it produces too little of the hormones thyroxine (T4) and triiodothyronine (T3).  The most common cause is Hashimoto's disease, a disease in which the body's disease-fighting system (immune system) attacks the thyroid gland. The condition can also be caused by viral infections, medicine, pregnancy, or past   radiation treatment to the head or neck.  Symptoms may include weight gain, dry skin, constipation, feeling as though you do not have energy, and not being able to tolerate cold.  This condition is treated with medicine to replace the thyroid hormones that your body does not make. This information  is not intended to replace advice given to you by your health care provider. Make sure you discuss any questions you have with your health care provider. Document Revised: 01/06/2017 Document Reviewed: 01/04/2017 Elsevier Patient Education  2020 Elsevier Inc.  

## 2019-09-04 NOTE — Progress Notes (Signed)
Subjective:  Patient ID: Keith [name], male    DOB: 1941/01/27  Age: 79 y.o. MRN: [id number]  CC: Hypothyroidism and Hyperlipidemia  This visit occurred during the SARS-CoV-2 public health emergency.  Safety protocols were in place, including screening questions prior to the visit, additional usage of staff PPE, and extensive cleaning of exam room while observing appropriate contact time as indicated for disinfecting solutions.    HPI [name] presents for f/up - He has felt well recently and offers no complaints.  Outpatient Medications Prior to Visit  Medication Sig Dispense Refill  . aspirin EC 81 MG tablet Take 81 mg by mouth daily.    . Calcium Carb-Cholecalciferol (CALCIUM + D3 PO) Take 1 tablet by mouth daily.    . ferrous sulfate 325 (65 FE) MG EC tablet Take 325 mg by mouth daily.    . fluticasone (FLONASE) 50 MCG/ACT nasal spray Place into both nostrils daily.    Marland Kitchen levothyroxine (SYNTHROID) 112 MCG tablet Take 1 tablet (112 mcg total) by mouth daily. 90 tablet 1  . magnesium citrate SOLN Take 1 Bottle by mouth once.    . meclizine (ANTIVERT) 12.5 MG tablet Take 1 tablet (12.5 mg total) by mouth 3 (three) times daily as needed for dizziness. 30 tablet 2  . benzonatate (TESSALON) 100 MG capsule Take 1 capsule (100 mg total) by mouth every 8 (eight) hours. 21 capsule 0  . LIVALO 1 MG TABS TAKE 1 TABLET(1 MG) BY MOUTH DAILY (Patient not taking: Reported on 09/04/2019) 90 tablet 1   No facility-administered medications prior to visit.    ROS Review of Systems  Constitutional: Negative for diaphoresis, fatigue and unexpected weight change.  HENT: Negative.  Negative for trouble swallowing.   Eyes: Negative.   Respiratory: Negative for cough, chest tightness, shortness of breath and wheezing.   Cardiovascular: Negative for chest pain, palpitations and leg swelling.  Gastrointestinal: Negative for abdominal pain, constipation, diarrhea, nausea and vomiting.  Endocrine:  Negative.  Negative for cold intolerance and heat intolerance.  Genitourinary: Negative.   Musculoskeletal: Negative for arthralgias and myalgias.  Skin: Negative.  Negative for color change.  Neurological: Negative.  Negative for dizziness, weakness, light-headedness and headaches.  Hematological: Negative for adenopathy. Does not bruise/bleed easily.  Psychiatric/Behavioral: Negative.  Negative for dysphoric mood and suicidal ideas. The patient is not nervous/anxious.     Objective:  BP (!) 130/78 (BP Location: Left Arm, Patient Position: Sitting, Cuff Size: Large)   Pulse 67   Temp 98.3 F (36.8 C) (Oral)   Ht 5\' 10"  (1.778 m)   Wt 196 lb 6 oz (89.1 kg)   SpO2 97%   BMI 28.18 kg/m   BP Readings from Last 3 Encounters:  09/04/19 (!) 130/78  08/20/19 140/85  07/24/19 115/74    Wt Readings from Last 3 Encounters:  09/04/19 196 lb 6 oz (89.1 kg)  07/24/19 195 lb (88.5 kg)  06/20/19 196 lb (88.9 kg)    Physical Exam Vitals reviewed.  Constitutional:      Appearance: Normal appearance.  HENT:     Mouth/Throat:     Mouth: Mucous membranes are moist.  Eyes:     General: No scleral icterus.    Conjunctiva/sclera: Conjunctivae normal.  Cardiovascular:     Rate and Rhythm: Normal rate and regular rhythm.     Heart sounds: No murmur heard.   Pulmonary:     Effort: Pulmonary effort is normal.     Breath sounds: No wheezing,  rhonchi or rales.  Abdominal:     General: Abdomen is flat. Bowel sounds are normal. There is no distension.     Palpations: Abdomen is soft. There is no hepatomegaly, splenomegaly or mass.     Tenderness: There is no abdominal tenderness.  Musculoskeletal:        General: Normal range of motion.     Cervical back: Neck supple.     Right lower leg: No edema.     Left lower leg: No edema.  Lymphadenopathy:     Cervical: No cervical adenopathy.  Skin:    General: Skin is warm and dry.  Neurological:     General: No focal deficit present.      Mental Status: He is alert.     Lab Results  Component Value Date   WBC 9.3 11/05/2018   HGB 15.5 11/05/2018   HCT 46.9 11/05/2018   PLT 200.0 11/05/2018   GLUCOSE 94 11/05/2018   CHOL 145 09/04/2019   TRIG 94 09/04/2019   HDL 41 09/04/2019   LDLCALC 85 09/04/2019   ALT 11 01/01/2018   AST 14 01/01/2018   NA 141 11/05/2018   K 4.5 11/05/2018   CL 106 11/05/2018   CREATININE 1.09 11/05/2018   BUN 17 11/05/2018   CO2 27 11/05/2018   TSH 8.67 (H) 09/04/2019   PSA 3.4 09/04/2019   INR 1.01 11/27/2017   HGBA1C 5.9 01/01/2018    DG Epidural/Nerve Root  Result Date: 08/20/2019 CLINICAL DATA:  Left radicular pain. Excellent response to previous left L4 nerve root block and transforaminal epidural. Recurrence of symptoms. EXAM: Left L4 nerve root block and transforaminal epidural COMPARISON:  Previous injection images [id number] TECHNIQUE: The overlying skin was prepped with Betadine and draped in sterile fashion. Skin anesthesia was carried out using 1% Lidocaine. A curved 22 gauge spinal needle was directed into the superior ventral neural foramen on the left at L4-5.Injection of a few drops of Isovue 200 demonstrates spread outlining the left L4 nerve root. No intravascular extension. 120mg  Depo-Medrol and 2cc 1% Lidocaine were subsequently administered. No apparent complication. The patient tolerated the procedure without difficulty and was transferred to recovery in excellent condition. FLUOROSCOPY TIME:  0 minutes 52 seconds. 46.76 micro gray meter squared IMPRESSION: Technically successful left L4 selective nerve root block and transforaminal epidural steroid injection. Electronically Signed   By: Nelson Chimes M.D.   On: 08/20/2019 14:31    Assessment & Plan:   Theodoros was seen today for hypothyroidism and hyperlipidemia.  Diagnoses and all orders for this visit:  Acquired hypothyroidism- His TSH is mildly elevated but it is less than 10 and clinically he appears euthyroid.  This is  acceptable considering his age.  Will continue the current dose of levothyroxine. -     Cancel: TSH; Future -     TSH; Future -     TSH  Prostate nodule- His PSA is not rising which is a reassuring sign that he does not have prostate cancer. -     PSA; Future -     PSA  Hyperlipidemia with target LDL less than 130- He has an elevated ASCVD risk score but is not willing to take a statin for CV risk reduction. -     Lipid panel; Future -     Lipid panel   I have discontinued Arael A. Krapf's meclizine, Livalo, and benzonatate. I am also having him maintain his ferrous sulfate, Calcium Carb-Cholecalciferol (CALCIUM + D3 PO), aspirin EC, levothyroxine,  magnesium citrate, and fluticasone.  No orders of the defined types were placed in this encounter.    Follow-up: Return in about 6 months (around 03/06/2020).  Scarlette Calico, MD

## 2019-09-05 ENCOUNTER — Encounter: Payer: Self-pay | Admitting: Internal Medicine

## 2019-09-05 LAB — LIPID PANEL
Cholesterol: 145 mg/dL (ref <200)
HDL: 41 mg/dL (ref >=40)
LDL Cholesterol (Calc): 85 mg/dL (calc)
Non-HDL Cholesterol (Calc): 104 mg/dL (calc) (ref <130)
Total CHOL/HDL Ratio: 3.5 (calc) (ref <5.0)
Triglycerides: 94 mg/dL (ref <150)

## 2019-09-05 LAB — TSH: TSH: 8.67 mIU/L — ABNORMAL HIGH (ref 0.40–4.50)

## 2019-09-05 LAB — PSA: PSA: 3.4 ng/mL (ref <=4.0)

## 2019-09-08 ENCOUNTER — Other Ambulatory Visit: Payer: Self-pay | Admitting: Internal Medicine

## 2019-09-10 ENCOUNTER — Telehealth: Payer: Self-pay | Admitting: Internal Medicine

## 2019-09-10 DIAGNOSIS — E039 Hypothyroidism, unspecified: Secondary | ICD-10-CM

## 2019-09-10 NOTE — Telephone Encounter (Signed)
   Patient requesting refill on levothyroxine (SYNTHROID) 112 MCG tablet  Pharmacy : [facility] - [place], [place] - [address]

## 2019-09-11 MED ORDER — LEVOTHYROXINE SODIUM 112 MCG PO TABS: 112.0000 ug | ORAL_TABLET | Freq: Every day | ORAL | 1 refills | Status: DC

## 2019-09-11 NOTE — Telephone Encounter (Signed)
Erx has been sent. 

## 2019-09-18 ENCOUNTER — Other Ambulatory Visit: Payer: Self-pay

## 2019-09-18 ENCOUNTER — Ambulatory Visit: Payer: Medicare PPO | Admitting: Family Medicine

## 2019-09-18 ENCOUNTER — Encounter: Payer: Self-pay | Admitting: Family Medicine

## 2019-09-18 ENCOUNTER — Ambulatory Visit (INDEPENDENT_AMBULATORY_CARE_PROVIDER_SITE_OTHER): Payer: Medicare PPO

## 2019-09-18 VITALS — BP 124/84 | HR 79 | Ht 70.0 in | Wt 196.0 lb

## 2019-09-18 DIAGNOSIS — M545 Low back pain, unspecified: Secondary | ICD-10-CM

## 2019-09-18 DIAGNOSIS — G8929 Other chronic pain: Secondary | ICD-10-CM

## 2019-09-18 DIAGNOSIS — M5416 Radiculopathy, lumbar region: Secondary | ICD-10-CM

## 2019-09-18 MED ORDER — METHYLPREDNISOLONE ACETATE 40 MG/ML IJ SUSP: 40.0000 mg | Freq: Once | INTRAMUSCULAR | Status: DC

## 2019-09-18 MED ORDER — METHYLPREDNISOLONE ACETATE 80 MG/ML IJ SUSP
80.0000 mg | Freq: Once | INTRAMUSCULAR | Status: AC
  Administered 2019-09-18: 80 mg via INTRAMUSCULAR

## 2019-09-18 MED ORDER — KETOROLAC TROMETHAMINE 30 MG/ML IJ SOLN
30.0000 mg | Freq: Once | INTRAMUSCULAR | Status: AC
  Administered 2019-09-18: 30 mg via INTRAMUSCULAR

## 2019-09-18 NOTE — Patient Instructions (Signed)
Good to see you Happy Birthday!!  Lumbar x-ray on way out MRI ordered Injection given to help with pain Talk via mychart after MRI

## 2019-09-18 NOTE — Progress Notes (Signed)
Keith [name] Phone: [phone] Subjective:   Keith [name], am serving as a scribe for Dr. Hulan Saas. This visit occurred during the SARS-CoV-2 public health emergency.  Safety protocols were in place, including screening questions prior to the visit, additional usage of staff PPE, and extensive cleaning of exam room while observing appropriate contact time as indicated for disinfecting solutions.   I'm seeing this patient by the request  of:  Keith Lima, MD  CC: Back pain follow-up  [id number]   10/11/2018 Patient given injection today.  Tolerated the procedure well.  Discussed icing regimen and home exercises, which activities to do which wants to avoid.  I am hoping the patient will continue to improve over the course the next several days.  Follow-up again in 4 to 6 weeks  Update 09/18/2019 Keith [name] is a 79 y.o. male coming in with complaint of right hip pain. Last epidural did not help. Numbness begins in right hip and radiates down into right shin. Dull pain with lumbar flexion. Pain dissipates after 5 minutes. Patient notes limping on right leg.     Patient did have another epidural done on August 20, 2019.  Past Medical History:  Diagnosis Date  . Allergic rhinitis   . Cancer ([place])    SKIN CANCER ON LIP (30 YR AGO)  . DDD (degenerative disc disease), cervical   . Hyperlipidemia   . Hypothyroidism   . PONV (postoperative nausea and vomiting)    2016 BACK SURGERY, IN RECOVERY HE HAD N/V   Past Surgical History:  Procedure Laterality Date  . AMPUTATED Right 1961   TRAUMATIC AMPUTATED FINGERS RIGHT   . BACK SURGERY  2016  . TONSILLECTOMY    . TRANSFORAMINAL LUMBAR INTERBODY FUSION (TLIF) WITH PEDICLE SCREW FIXATION 1 LEVEL Right 11/29/2017   Procedure: RIGHT SIDED LUMBAR 4-5 TRANSFORAMINAL LUMBAR INTERBODY FUSION WITH INSTRUMENTATION AND ALLOGRAFT;  Surgeon: Phylliss Bob, MD;  Location:  [place];  Service: Orthopedics;  Laterality: Right;   Social History   Socioeconomic History  . Marital status: Married    Spouse name: Not on file  . Number of children: Not on file  . Years of education: Not on file  . Highest education level: Not on file  Occupational History  . 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 Occupation: Designer, industrial/product bus  Tobacco Use  . Smoking status: Never Smoker  . Smokeless tobacco: Never Used  Vaping Use  . Vaping Use: Never used  Substance and Sexual Activity  . Alcohol use: [name]  . Drug use: [name]  . Sexual activity: Not Currently  Other Topics Concern  . Not on file  Social History Narrative   Regular Exercise -  YES         Social Determinants of Health   Financial Resource Strain:   . Difficulty of Paying Living Expenses:   Food Insecurity:   . Worried About Charity fundraiser in the Last Year:   . Arboriculturist in the Last Year:   Transportation Needs:   . Film/video editor (Medical):   Marland Kitchen Lack of Transportation (Non-Medical):   Physical Activity:   . Days of Exercise per Week:   . Minutes of Exercise per Session:   Stress:   . Feeling of Stress :   Social Connections:   . Frequency of Communication with Friends and Family:   .  Frequency of Social Gatherings with Friends and Family:   . Attends Religious Services:   . Active Member of Clubs or Organizations:   . Attends Archivist Meetings:   Marland Kitchen Marital Status:    Allergies  Allergen Reactions  . Atorvastatin Other (See Comments)    leg cramps  . Rosuvastatin Other (See Comments)    myalgias   Family History  Problem Relation Age of Onset  . Diabetes Mother   . Coronary artery disease Father        Fatal MI  . Diabetes Father   . Coronary artery disease Other   . Breast cancer Other     Current Outpatient Medications (Endocrine & Metabolic):  .  levothyroxine (SYNTHROID) 112 MCG tablet, Take 1 tablet (112 mcg total) by  mouth daily.   Current Outpatient Medications (Respiratory):  .  fluticasone (FLONASE) 50 MCG/ACT nasal spray, SHAKE LIQUID AND USE 2 SPRAYS IN EACH NOSTRIL EVERY DAY  Current Outpatient Medications (Analgesics):  .  aspirin EC 81 MG tablet, Take 81 mg by mouth daily.  Current Outpatient Medications (Hematological):  .  ferrous sulfate 325 (65 FE) MG EC tablet, Take 325 mg by mouth daily.  Current Outpatient Medications (Other):  Marland Kitchen  Calcium Carb-Cholecalciferol (CALCIUM + D3 PO), Take 1 tablet by mouth daily. .  magnesium citrate SOLN, Take 1 Bottle by mouth once.   Reviewed prior external information including notes and imaging from  primary care provider As well as notes that were available from care everywhere and other healthcare systems.  Past medical history, social, surgical and family history all reviewed in electronic medical record.  [name] pertanent information unless stated regarding to the chief complaint.   Review of Systems:  [name] headache, visual changes, nausea, vomiting, diarrhea, constipation, dizziness, abdominal pain, skin rash, fevers, chills, night sweats, weight loss, swollen lymph nodes, body aches, joint swelling, chest pain, shortness of breath, mood changes. POSITIVE muscle aches  Objective  Blood pressure 124/84, pulse 79, height 5\' 10"  (1.778 m), weight 196 lb (88.9 kg), SpO2 99 %.   General: [name] apparent distress alert and oriented x3 mood and affect normal, dressed appropriately.  HEENT: Pupils equal, extraocular movements intact  Respiratory: Patient's speak in full sentences and does not appear short of breath  Cardiovascular: [name] lower extremity edema, non tender, [name] erythema   Gait antalgic MSK: Low back does not loss of lordosis, positive straight leg test.  Patient does have foot drop on the right side which is a new finding.  Significant weakness with 2 out of 5 strength.  Deep tendon reflex is decreased on the right side.    Impression and  Recommendations:     The above documentation has been reviewed and is accurate and complete Lyndal Pulley, DO       Note: This dictation was prepared with Dragon dictation along with smaller phrase technology. Any transcriptional errors that result from this process are unintentional.

## 2019-09-18 NOTE — Assessment & Plan Note (Signed)
Lumbar radiculopathy with worsening weakness on the right side.  Mom concerned that patient is actually having adjacent segment disease the patient's previous fusion.  Patient is having weakness which is new and I do feel he needs further evaluation at the moment.  Presenting somewhat similar as previous difficulty and I do feel x-ray and MRI is necessary.  Patient is also the primary caregiver for his ailing wife which does make this somewhat difficult.  Depending on imaging we will see how patient responds to the imaging and discussed treatment options

## 2019-10-07 ENCOUNTER — Other Ambulatory Visit: Payer: Self-pay

## 2019-10-07 ENCOUNTER — Ambulatory Visit
Admission: RE | Admit: 2019-10-07 | Discharge: 2019-10-07 | Disposition: A | Discharge status: Home / Self Care | Payer: Medicare PPO | Source: Ambulatory Visit | Attending: Family Medicine | Admitting: Family Medicine

## 2019-10-07 DIAGNOSIS — M545 Low back pain, unspecified: Secondary | ICD-10-CM

## 2019-10-07 DIAGNOSIS — M48061 Spinal stenosis, lumbar region without neurogenic claudication: Secondary | ICD-10-CM | POA: Diagnosis not present

## 2019-10-09 ENCOUNTER — Other Ambulatory Visit: Payer: Self-pay

## 2019-10-09 DIAGNOSIS — M5416 Radiculopathy, lumbar region: Secondary | ICD-10-CM

## 2019-10-16 ENCOUNTER — Ambulatory Visit
Admission: RE | Admit: 2019-10-16 | Discharge: 2019-10-16 | Disposition: A | Discharge status: Home / Self Care | Payer: Medicare PPO | Source: Ambulatory Visit | Attending: Family Medicine | Admitting: Family Medicine

## 2019-10-16 ENCOUNTER — Other Ambulatory Visit: Payer: Self-pay

## 2019-10-16 DIAGNOSIS — M5416 Radiculopathy, lumbar region: Secondary | ICD-10-CM | POA: Diagnosis not present

## 2019-10-16 DIAGNOSIS — M47817 Spondylosis without myelopathy or radiculopathy, lumbosacral region: Secondary | ICD-10-CM | POA: Diagnosis not present

## 2019-10-16 MED ORDER — IOPAMIDOL (ISOVUE-M 200) INJECTION 41%
1.0000 mL | Freq: Once | INTRAMUSCULAR | Status: AC
  Administered 2019-10-16: 1 mL via EPIDURAL

## 2019-10-16 MED ORDER — METHYLPREDNISOLONE ACETATE 40 MG/ML INJ SUSP (RADIOLOG
120.0000 mg | Freq: Once | INTRAMUSCULAR | Status: AC
  Administered 2019-10-16: 120 mg via EPIDURAL

## 2019-10-21 ENCOUNTER — Telehealth: Payer: Self-pay | Admitting: Family Medicine

## 2019-10-21 NOTE — Telephone Encounter (Signed)
Pt called to advise that he had an epidural on 9/8 and is already feeling better. He does not have any follow up scheduled and just wanted to let you know he has gotten some relief.

## 2019-12-24 ENCOUNTER — Encounter: Payer: Self-pay | Admitting: Family Medicine

## 2019-12-24 ENCOUNTER — Other Ambulatory Visit: Payer: Self-pay

## 2019-12-24 ENCOUNTER — Ambulatory Visit: Payer: Medicare PPO | Admitting: Family Medicine

## 2019-12-24 VITALS — BP 110/72 | HR 62 | Ht 70.0 in | Wt 200.0 lb

## 2019-12-24 DIAGNOSIS — M5416 Radiculopathy, lumbar region: Secondary | ICD-10-CM

## 2019-12-24 NOTE — Assessment & Plan Note (Signed)
Worsening symptoms worsening symptoms at this time.  We have seen patient previously.  Patient had responded previously to gabapentin and some other medications but he has discontinued them with him being the primary caregiver for his wife.  Patient wants to avoid anything that could make him a little somnolent.  I do think patient has responded fairly well to epidurals and will try an L5-S1 epidural with patient having some mild foot drop again.  Has responded in the past and states that the last one in September helped the back pain significantly but now the leg seems to be worse.  We asked multiple different questions and patient declined any type of headache, any difficulty with words, any chest pain or shortness of breath.  We discussed that he is due for labs again in January with primary care provider to check the thyroid.  Patient declined formal physical therapy.  Patient wants to avoid any surgical intervention but if he continues to have trouble he will consider it.  Follow-up with me again in 4 to 6 weeks after the epidural.

## 2019-12-24 NOTE — Progress Notes (Signed)
[place] Phone: [phone] Subjective:   Keith [name], am serving as a scribe for Dr. Hulan [name]. This visit occurred during the SARS-CoV-2 public health emergency.  Safety protocols were in place, including screening questions prior to the visit, additional usage of staff PPE, and extensive cleaning of exam room while observing appropriate contact time as indicated for disinfecting solutions.   I'm seeing this patient by the request  of:  Keith Lima, MD  CC: Back pain follow-up leg weakness  [id number]   09/18/2019 Lumbar radiculopathy with worsening weakness on the right side.  Mom concerned that patient is actually having adjacent segment disease the patient's previous fusion.  Patient is having weakness which is new and I do feel he needs further evaluation at the moment.  Presenting somewhat similar as previous difficulty and I do feel x-ray and MRI is necessary.  Patient is also the primary caregiver for his ailing wife which does make this somewhat difficult.  Depending on imaging we will see how patient responds to the imaging and discussed treatment options   Update 12/24/2019 Keith [name] is a 79 y.o. male coming in with complaint of low back pain. Patient states that in the morning his right foot will be numb. Numbness continues throughout the day.  Feels fatigue sometimes.  Numbness is only on the left leg.  Seems to be on the anterior shin continues to have weakness but states that it feels like this way since his surgery.  That was in 2016 patient just feels like having some mild increase in feeling unstable.  Did have an epidural in September that did help initially but now worsening symptoms again      Past Medical History:  Diagnosis Date  . Allergic rhinitis   . Cancer ([place])    SKIN CANCER ON LIP (30 YR AGO)  . DDD (degenerative disc disease), cervical   . Hyperlipidemia   .  Hypothyroidism   . PONV (postoperative nausea and vomiting)    2016 BACK SURGERY, IN RECOVERY HE HAD N/V   Past Surgical History:  Procedure Laterality Date  . AMPUTATED Right 1961   TRAUMATIC AMPUTATED FINGERS RIGHT   . BACK SURGERY  2016  . TONSILLECTOMY    . TRANSFORAMINAL LUMBAR INTERBODY FUSION (TLIF) WITH PEDICLE SCREW FIXATION 1 LEVEL Right 11/29/2017   Procedure: RIGHT SIDED LUMBAR 4-5 TRANSFORAMINAL LUMBAR INTERBODY FUSION WITH INSTRUMENTATION AND ALLOGRAFT;  Surgeon: Keith Bob, MD;  Location: [facility];  Service: Orthopedics;  Laterality: Right;   Social History   Socioeconomic History  . Marital status: Married    Spouse name: Not on file  . Number of children: Not on file  . Years of education: Not on file  . Highest education level: Not on file  Occupational History  . 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 Occupation: Designer, industrial/product bus  Tobacco Use  . Smoking status: Never Smoker  . Smokeless tobacco: Never Used  Vaping Use  . Vaping Use: Never used  Substance and Sexual Activity  . Alcohol use: [name]  . Drug use: [name]  . Sexual activity: Not Currently  Other Topics Concern  . Not on file  Social History Narrative   Regular Exercise -  YES         Social Determinants of Health   Financial Resource Strain:   . Difficulty of Paying Living Expenses: Not on file  Food Insecurity:   . Worried About Charity fundraiser in the Last Year: Not on file  . Ran Out of Food in the Last Year: Not on file  Transportation Needs:   . Lack of Transportation (Medical): Not on file  . Lack of Transportation (Non-Medical): Not on file  Physical Activity:   . Days of Exercise per Week: Not on file  . Minutes of Exercise per Session: Not on file  Stress:   . Feeling of Stress : Not on file  Social Connections:   . Frequency of Communication with Friends and Family: Not on file  . Frequency of Social Gatherings with Friends and Family: Not on  file  . Attends Religious Services: Not on file  . Active Member of Clubs or Organizations: Not on file  . Attends Archivist Meetings: Not on file  . Marital Status: Not on file   Allergies  Allergen Reactions  . Atorvastatin Other (See Comments)    leg cramps  . Rosuvastatin Other (See Comments)    myalgias   Family History  Problem Relation Age of Onset  . Diabetes Mother   . Coronary artery disease Father        Fatal MI  . Diabetes Father   . Coronary artery disease Other   . Breast cancer Other     Current Outpatient Medications (Endocrine & Metabolic):  .  levothyroxine (SYNTHROID) 112 MCG tablet, Take 1 tablet (112 mcg total) by mouth daily.   Current Outpatient Medications (Respiratory):  .  fluticasone (FLONASE) 50 MCG/ACT nasal spray, SHAKE LIQUID AND USE 2 SPRAYS IN EACH NOSTRIL EVERY DAY  Current Outpatient Medications (Analgesics):  .  aspirin EC 81 MG tablet, Take 81 mg by mouth daily.  Current Outpatient Medications (Hematological):  .  ferrous sulfate 325 (65 FE) MG EC tablet, Take 325 mg by mouth daily.  Current Outpatient Medications (Other):  Marland Kitchen  Calcium Carb-Cholecalciferol (CALCIUM + D3 PO), Take 1 tablet by mouth daily. .  magnesium citrate SOLN, Take 1 Bottle by mouth once.   Reviewed prior external information including notes and imaging from  primary care provider As well as notes that were available from care everywhere and other healthcare systems.  Past medical history, social, surgical and family history all reviewed in electronic medical record.  [name] pertanent information unless stated regarding to the chief complaint.   Review of Systems:  [name] headache, visual changes, nausea, vomiting, diarrhea, constipation, dizziness, abdominal pain, skin rash, fevers, chills, night sweats, weight loss, swollen lymph nodes, body aches, joint swelling, chest pain, shortness of breath, mood changes. POSITIVE muscle aches  Objective  Blood  pressure 110/72, pulse 62, height 5\' 10"  (1.778 m), weight 200 lb (90.7 kg), SpO2 99 %.   General: [name] apparent distress alert and oriented x3 mood and affect normal, dressed appropriately.  HEENT: Pupils equal, extraocular movements intact  Respiratory: Patient's speak in full sentences and does not appear short of breath  Cardiovascular: [name] lower extremity edema, non tender, [name] erythema  Antalgic gait Patient does have 3+ out of 5 strength with dorsi flexion of the right foot.  Deep tendon reflexes 1+ of the Achilles noted.  Minorly worse than patient's baseline.  Patient does have 4+ out of 5 strength of the hip flexion.  Back exam mild tenderness diffusely right greater than left in the paraspinal musculature of the lumbar spine.   Impression and Recommendations:     The above documentation has been reviewed and  is accurate and complete Lyndal Pulley, DO

## 2019-12-24 NOTE — Patient Instructions (Addendum)
L5/S1 epidural: [facility] [phone] If injection does not help, consider nerve conduction test If you do not feel right, see PCP  Looks like you are due for labs again in January See me again 6 weeks after injection

## 2020-01-06 ENCOUNTER — Other Ambulatory Visit: Payer: Self-pay

## 2020-01-06 ENCOUNTER — Ambulatory Visit
Admission: RE | Admit: 2020-01-06 | Discharge: 2020-01-06 | Disposition: A | Discharge status: Home / Self Care | Payer: Medicare PPO | Source: Ambulatory Visit | Attending: Family Medicine | Admitting: Family Medicine

## 2020-01-06 DIAGNOSIS — M5416 Radiculopathy, lumbar region: Secondary | ICD-10-CM

## 2020-01-06 DIAGNOSIS — M47817 Spondylosis without myelopathy or radiculopathy, lumbosacral region: Secondary | ICD-10-CM | POA: Diagnosis not present

## 2020-01-06 DIAGNOSIS — M5116 Intervertebral disc disorders with radiculopathy, lumbar region: Secondary | ICD-10-CM | POA: Diagnosis not present

## 2020-01-06 MED ORDER — METHYLPREDNISOLONE ACETATE 40 MG/ML INJ SUSP (RADIOLOG
120.0000 mg | Freq: Once | INTRAMUSCULAR | Status: AC
  Administered 2020-01-06: 120 mg via EPIDURAL

## 2020-01-06 MED ORDER — IOPAMIDOL (ISOVUE-M 200) INJECTION 41%
1.0000 mL | Freq: Once | INTRAMUSCULAR | Status: AC
  Administered 2020-01-06: 1 mL via EPIDURAL

## 2020-01-06 NOTE — Discharge Instructions (Signed)

## 2020-01-08 ENCOUNTER — Other Ambulatory Visit: Payer: Self-pay | Admitting: Internal Medicine

## 2020-01-08 DIAGNOSIS — E785 Hyperlipidemia, unspecified: Secondary | ICD-10-CM

## 2020-03-02 ENCOUNTER — Other Ambulatory Visit: Payer: Self-pay

## 2020-03-03 ENCOUNTER — Ambulatory Visit: Payer: Medicare PPO | Admitting: Internal Medicine

## 2020-03-03 ENCOUNTER — Encounter: Payer: Self-pay | Admitting: Internal Medicine

## 2020-03-03 VITALS — BP 122/78 | HR 68 | Temp 98.2°F | Ht 70.0 in | Wt 201.0 lb

## 2020-03-03 DIAGNOSIS — E039 Hypothyroidism, unspecified: Secondary | ICD-10-CM

## 2020-03-03 DIAGNOSIS — R42 Dizziness and giddiness: Secondary | ICD-10-CM

## 2020-03-03 DIAGNOSIS — E785 Hyperlipidemia, unspecified: Secondary | ICD-10-CM

## 2020-03-03 LAB — CBC WITH DIFFERENTIAL/PLATELET
Basophils Absolute: 0.1 10*3/uL (ref 0.0–0.1)
Basophils Relative: 0.8 % (ref 0.0–3.0)
Eosinophils Absolute: 0.1 10*3/uL (ref 0.0–0.7)
Eosinophils Relative: 1.8 % (ref 0.0–5.0)
HCT: 47 % (ref 39.0–52.0)
Hemoglobin: 15.7 g/dL (ref 13.0–17.0)
Lymphocytes Relative: 26.9 % (ref 12.0–46.0)
Lymphs Abs: 2.1 10*3/uL (ref 0.7–4.0)
MCHC: 33.5 g/dL (ref 30.0–36.0)
MCV: 93.4 fl (ref 78.0–100.0)
Monocytes Absolute: 0.7 10*3/uL (ref 0.1–1.0)
Monocytes Relative: 9.2 % (ref 3.0–12.0)
Neutro Abs: 4.7 10*3/uL (ref 1.4–7.7)
Neutrophils Relative %: 61.3 % (ref 43.0–77.0)
Platelets: 166 10*3/uL (ref 150.0–400.0)
RBC: 5.03 Mil/uL (ref 4.22–5.81)
RDW: 14 % (ref 11.5–15.5)
WBC: 7.7 10*3/uL (ref 4.0–10.5)

## 2020-03-03 LAB — LIPID PANEL
Cholesterol: 159 mg/dL (ref 0–200)
HDL: 37.5 mg/dL — ABNORMAL LOW (ref >39.00)
LDL Cholesterol: 93 mg/dL (ref 0–99)
NonHDL: 121.47
Total CHOL/HDL Ratio: 4
Triglycerides: 140 mg/dL (ref 0.0–149.0)
VLDL: 28 mg/dL (ref 0.0–40.0)

## 2020-03-03 LAB — BASIC METABOLIC PANEL
BUN: 17 mg/dL (ref 6–23)
CO2: 29 mEq/L (ref 19–32)
Calcium: 9.1 mg/dL (ref 8.4–10.5)
Chloride: 104 mEq/L (ref 96–112)
Creatinine, Ser: 1.04 mg/dL (ref 0.40–1.50)
GFR: 68.8 mL/min (ref >60.00)
Glucose, Bld: 86 mg/dL (ref 70–99)
Potassium: 4.2 mEq/L (ref 3.5–5.1)
Sodium: 139 mEq/L (ref 135–145)

## 2020-03-03 LAB — HEPATIC FUNCTION PANEL
ALT: 21 U/L (ref 0–53)
AST: 21 U/L (ref 0–37)
Albumin: 4 g/dL (ref 3.5–5.2)
Alkaline Phosphatase: 46 U/L (ref 39–117)
Bilirubin, Direct: 0.2 mg/dL (ref 0.0–0.3)
Total Bilirubin: 0.8 mg/dL (ref 0.2–1.2)
Total Protein: 6.9 g/dL (ref 6.0–8.3)

## 2020-03-03 LAB — BRAIN NATRIURETIC PEPTIDE: Pro B Natriuretic peptide (BNP): 41 pg/mL (ref 0.0–100.0)

## 2020-03-03 LAB — TSH: TSH: 4.65 u[IU]/mL — ABNORMAL HIGH (ref 0.35–4.50)

## 2020-03-03 LAB — TROPONIN I (HIGH SENSITIVITY): High Sens Troponin I: 5 ng/L (ref 2–17)

## 2020-03-03 MED ORDER — LIVALO 1 MG PO TABS: 1.0000 | ORAL_TABLET | Freq: Every day | ORAL | 1 refills | Status: DC

## 2020-03-03 NOTE — Progress Notes (Unsigned)
Subjective:  Patient ID: Keith [name], male    DOB: Feb 14, 1940  Age: 80 y.o. MRN: [id number]  CC: Hypothyroidism  This visit occurred during the SARS-CoV-2 public health emergency.  Safety protocols were in place, including screening questions prior to the visit, additional usage of staff PPE, and extensive cleaning of exam room while observing appropriate contact time as indicated for disinfecting solutions.    HPI Keith [name] presents for f/up - He complains of a 1 week history of dizziness and nausea with activity. He has had this before and was treated for vertigo. He tells me the symptoms are not significant enough for them to be treated. He is active and denies any recent episodes of chest pain, palpitations, edema, or fatigue. He has numbness in his right lower leg for 5 years after a prior lumbar surgery. He has nonradiating low back pain.  Outpatient Medications Prior to Visit  Medication Sig Dispense Refill  . aspirin EC 81 MG tablet Take 81 mg by mouth daily.    . Calcium Carb-Cholecalciferol (CALCIUM + D3 PO) Take 1 tablet by mouth daily.    . ferrous sulfate 325 (65 FE) MG EC tablet Take 325 mg by mouth daily.    . fluticasone (FLONASE) 50 MCG/ACT nasal spray SHAKE LIQUID AND USE 2 SPRAYS IN EACH NOSTRIL EVERY DAY 16 g 11  . magnesium citrate SOLN Take 1 Bottle by mouth once.    Marland Kitchen levothyroxine (SYNTHROID) 112 MCG tablet Take 1 tablet (112 mcg total) by mouth daily. 90 tablet 1   No facility-administered medications prior to visit.    ROS Review of Systems  Constitutional: Negative for appetite change, diaphoresis, fatigue and unexpected weight change.  HENT: Negative.   Eyes: Negative.   Respiratory: Negative for cough, chest tightness, shortness of breath and wheezing.   Cardiovascular: Negative for chest pain, palpitations and leg swelling.  Gastrointestinal: Negative for abdominal pain, constipation, diarrhea, nausea and vomiting.  Endocrine: Negative for cold  intolerance and heat intolerance.  Genitourinary: Negative.  Negative for difficulty urinating.  Musculoskeletal: Positive for back pain. Negative for arthralgias, myalgias and neck pain.  Skin: Negative for color change and pallor.  Neurological: Positive for dizziness and numbness. Negative for tremors, seizures, syncope, facial asymmetry, speech difficulty, weakness and light-headedness.  Hematological: Negative for adenopathy. Does not bruise/bleed easily.  Psychiatric/Behavioral: Negative.     Objective:  BP 122/78   Pulse 68   Temp 98.2 F (36.8 C) (Oral)   Ht 5\' 10"  (1.778 m)   Wt 201 lb (91.2 kg)   SpO2 98%   BMI 28.84 kg/m   BP Readings from Last 3 Encounters:  03/03/20 122/78  01/06/20 (!) 161/92  12/24/19 110/72    Wt Readings from Last 3 Encounters:  03/03/20 201 lb (91.2 kg)  12/24/19 200 lb (90.7 kg)  09/18/19 196 lb (88.9 kg)    Physical Exam Vitals reviewed.  Constitutional:      Appearance: Normal appearance.  HENT:     Right Ear: Tympanic membrane normal.     Nose: Nose normal.     Mouth/Throat:     Mouth: Mucous membranes are moist.  Eyes:     General: No scleral icterus.    Conjunctiva/sclera: Conjunctivae normal.  Cardiovascular:     Rate and Rhythm: Normal rate and regular rhythm.     Pulses: Normal pulses.     Heart sounds: No murmur heard.     Comments: EKG- Sinus rhythm with PSVT complexes Otherwise  normal EKG Pulmonary:     Effort: Pulmonary effort is normal.     Breath sounds: No stridor. No wheezing, rhonchi or rales.  Abdominal:     General: Abdomen is flat.     Palpations: There is no mass.     Tenderness: There is no abdominal tenderness. There is no guarding.  Musculoskeletal:     Cervical back: Neck supple.     Right lower leg: No edema.     Left lower leg: No edema.  Lymphadenopathy:     Cervical: No cervical adenopathy.  Skin:    General: Skin is warm and dry.  Neurological:     General: No focal deficit present.      Mental Status: He is alert and oriented to person, place, and time. Mental status is at baseline.     Cranial Nerves: Cranial nerves are intact.     Sensory: Sensation is intact.     Motor: Motor function is intact.     Coordination: Coordination is intact.     Deep Tendon Reflexes: Reflexes normal.     Reflex Scores:      Tricep reflexes are 0 on the right side and 0 on the left side.      Bicep reflexes are 0 on the right side and 0 on the left side.      Brachioradialis reflexes are 0 on the right side and 0 on the left side.      Patellar reflexes are 0 on the right side and 0 on the left side.      Achilles reflexes are 0 on the right side and 0 on the left side. Psychiatric:        Mood and Affect: Mood normal.        Behavior: Behavior normal.     Lab Results  Component Value Date   WBC 7.7 03/03/2020   HGB 15.7 03/03/2020   HCT 47.0 03/03/2020   PLT 166.0 03/03/2020   GLUCOSE 86 03/03/2020   CHOL 159 03/03/2020   TRIG 140.0 03/03/2020   HDL 37.50 (L) 03/03/2020   LDLCALC 93 03/03/2020   ALT 21 03/03/2020   AST 21 03/03/2020   NA 139 03/03/2020   K 4.2 03/03/2020   CL 104 03/03/2020   CREATININE 1.04 03/03/2020   BUN 17 03/03/2020   CO2 29 03/03/2020   TSH 4.65 (H) 03/03/2020   PSA 3.4 09/04/2019   INR 1.01 11/27/2017   HGBA1C 5.9 01/01/2018    DG INJECT DIAG/THERA/INC NEEDLE/CATH/PLC EPI/LUMB/SAC W/IMG  Result Date: 01/06/2020 CLINICAL DATA:  Lumbosacral spondylosis without myelopathy. Right leg numbness and weakness. Good response to prior epidural injections. L5-S1 injection requested today. FLUOROSCOPY TIME:  Radiation Exposure Index (as provided by the fluoroscopic device): 3.5 mGy Fluoroscopy Time:  18 seconds Number of Acquired Images:  0 PROCEDURE: The procedure, risks, benefits, and alternatives were explained to the patient. Questions regarding the procedure were encouraged and answered. The patient understands and consents to the procedure. LUMBAR  EPIDURAL INJECTION: An interlaminar approach was performed on the right at L5-S1. The overlying skin was cleansed and anesthetized. A 3.5 inch 20 gauge epidural needle was advanced using loss-of-resistance technique. DIAGNOSTIC EPIDURAL INJECTION: Injection of Isovue-M 200 shows a good epidural pattern with spread above and below the level of needle placement, primarily on the right. No vascular opacification is seen. THERAPEUTIC EPIDURAL INJECTION: 120 mg of Depo-Medrol mixed with 3 mL of 1% lidocaine were instilled. The procedure was well-tolerated, and the patient  was discharged thirty minutes following the injection in good condition. COMPLICATIONS: None immediate. IMPRESSION: Technically successful interlaminar epidural injection on the right at L5-S1. Electronically Signed   By: Titus Dubin M.D.   On: 01/06/2020 12:09    Assessment & Plan:   Abrahm was seen today for hypothyroidism.  Diagnoses and all orders for this visit:  Acquired hypothyroidism- His TSH is in the normal range. He will stay on the current dose of levothyroxine. -     TSH; Future -     TSH -     levothyroxine (SYNTHROID) 112 MCG tablet; Take 1 tablet (112 mcg total) by mouth daily.  Hyperlipidemia with target LDL less than 130- Will continue a statin for cardiovascular risk reduction. -     Pitavastatin Calcium (LIVALO) 1 MG TABS; Take 1 tablet (1 mg total) by mouth daily. -     Lipid panel; Future -     TSH; Future -     Hepatic function panel; Future -     Hepatic function panel -     TSH -     Lipid panel  Dizziness- His EKG, troponin, and BNP are all reassuring. His labs are negative for secondary causes. This is likely a recurrence of vertigo. He does not request anything to treat the symptoms. -     CBC with Differential/Platelet; Future -     Basic metabolic panel; Future -     EKG 12-Lead -     Troponin I (High Sensitivity); Future -     Brain natriuretic peptide; Future -     Brain natriuretic  peptide -     Troponin I (High Sensitivity) -     Basic metabolic panel -     CBC with Differential/Platelet   I have changed Hai A. Copher's Livalo. I am also having him maintain his ferrous sulfate, Calcium Carb-Cholecalciferol (CALCIUM + D3 PO), aspirin EC, magnesium citrate, fluticasone, and levothyroxine.  Meds ordered this encounter  Medications  . Pitavastatin Calcium (LIVALO) 1 MG TABS    Sig: Take 1 tablet (1 mg total) by mouth daily.    Dispense:  90 tablet    Refill:  1  . levothyroxine (SYNTHROID) 112 MCG tablet    Sig: Take 1 tablet (112 mcg total) by mouth daily.    Dispense:  90 tablet    Refill:  1     Follow-up: Return in about 4 months (around 07/01/2020).  Scarlette Calico, MD

## 2020-03-03 NOTE — Patient Instructions (Signed)
Hypothyroidism  Hypothyroidism is when the thyroid gland does not make enough of certain hormones (it is underactive). The thyroid gland is a small gland located in the lower front part of the neck, just in front of the windpipe (trachea). This gland makes hormones that help control how the body uses food for energy (metabolism) as well as how the heart and brain function. These hormones also play a role in keeping your bones strong. When the thyroid is underactive, it produces too little of the hormones thyroxine (T4) and triiodothyronine (T3). What are the causes? This condition may be caused by:  Hashimoto's disease. This is a disease in which the body's disease-fighting system (immune system) attacks the thyroid gland. This is the most common cause.  Viral infections.  Pregnancy.  Certain medicines.  Birth defects.  Past radiation treatments to the head or neck for cancer.  Past treatment with radioactive iodine.  Past exposure to radiation in the environment.  Past surgical removal of part or all of the thyroid.  Problems with a gland in the center of the brain (pituitary gland).  Lack of enough iodine in the diet. What increases the risk? You are more likely to develop this condition if:  You are male.  You have a family history of thyroid conditions.  You use a medicine called lithium.  You take medicines that affect the immune system (immunosuppressants). What are the signs or symptoms? Symptoms of this condition include:  Feeling as though you have no energy (lethargy).  Not being able to tolerate cold.  Weight gain that is not explained by a change in diet or exercise habits.  Lack of appetite.  Dry skin.  Coarse hair.  Menstrual irregularity.  Slowing of thought processes.  Constipation.  Sadness or depression. How is this diagnosed? This condition may be diagnosed based on:  Your symptoms, your medical history, and a physical exam.  Blood  tests. You may also have imaging tests, such as an ultrasound or MRI. How is this treated? This condition is treated with medicine that replaces the thyroid hormones that your body does not make. After you begin treatment, it may take several weeks for symptoms to go away. Follow these instructions at home:  Take over-the-counter and prescription medicines only as told by your health care provider.  If you start taking any new medicines, tell your health care provider.  Keep all follow-up visits as told by your health care provider. This is important. ? As your condition improves, your dosage of thyroid hormone medicine may change. ? You will need to have blood tests regularly so that your health care provider can monitor your condition. Contact a health care provider if:  Your symptoms do not get better with treatment.  You are taking thyroid hormone replacement medicine and you: ? Sweat a lot. ? Have tremors. ? Feel anxious. ? Lose weight rapidly. ? Cannot tolerate heat. ? Have emotional swings. ? Have diarrhea. ? Feel weak. Get help right away if you have:  Chest pain.  An irregular heartbeat.  A rapid heartbeat.  Difficulty breathing. Summary  Hypothyroidism is when the thyroid gland does not make enough of certain hormones (it is underactive).  When the thyroid is underactive, it produces too little of the hormones thyroxine (T4) and triiodothyronine (T3).  The most common cause is Hashimoto's disease, a disease in which the body's disease-fighting system (immune system) attacks the thyroid gland. The condition can also be caused by viral infections, medicine, pregnancy, or   past radiation treatment to the head or neck.  Symptoms may include weight gain, dry skin, constipation, feeling as though you do not have energy, and not being able to tolerate cold.  This condition is treated with medicine to replace the thyroid hormones that your body does not make. This  information is not intended to replace advice given to you by your health care provider. Make sure you discuss any questions you have with your health care provider. Document Revised: 10/25/2019 Document Reviewed: 10/10/2019 [employer] Patient Education  2021 [employer]  

## 2020-03-04 ENCOUNTER — Telehealth: Payer: Self-pay | Admitting: Internal Medicine

## 2020-03-04 ENCOUNTER — Encounter: Payer: Self-pay | Admitting: Internal Medicine

## 2020-03-04 MED ORDER — LEVOTHYROXINE SODIUM 112 MCG PO TABS: 112.0000 ug | ORAL_TABLET | Freq: Every day | ORAL | 1 refills | Status: DC

## 2020-03-04 NOTE — Telephone Encounter (Signed)
Lab letter has been mailed 

## 2020-03-04 NOTE — Telephone Encounter (Signed)
Patient wondering if he can get copies of his labs done yesterday mailed to him

## 2020-04-13 ENCOUNTER — Telehealth: Payer: Self-pay | Admitting: Internal Medicine

## 2020-04-13 DIAGNOSIS — Z1211 Encounter for screening for malignant neoplasm of colon: Secondary | ICD-10-CM

## 2020-04-13 NOTE — Telephone Encounter (Signed)
Will you order this?

## 2020-04-13 NOTE — Telephone Encounter (Signed)
Patient would like a cologuard test mailed to him.   Please advise.

## 2020-04-14 NOTE — Telephone Encounter (Signed)
Ordered

## 2020-04-27 DIAGNOSIS — Z1211 Encounter for screening for malignant neoplasm of colon: Secondary | ICD-10-CM | POA: Diagnosis not present

## 2020-05-01 LAB — COLOGUARD
COLOGUARD: NEGATIVE
Cologuard: NEGATIVE

## 2020-06-30 ENCOUNTER — Ambulatory Visit (INDEPENDENT_AMBULATORY_CARE_PROVIDER_SITE_OTHER): Payer: Medicare PPO

## 2020-06-30 DIAGNOSIS — Z0000 Encounter for general adult medical examination without abnormal findings: Secondary | ICD-10-CM | POA: Diagnosis not present

## 2020-06-30 NOTE — Progress Notes (Signed)
I connected with Lauro Franklin today by telephone and verified that I am speaking with the correct person using two identifiers. Location patient: home Location provider: work Persons participating in the virtual visit: Rody Keadle and [place], LPN.   I discussed the limitations, risks, security and privacy concerns of performing an evaluation and management service by telephone and the availability of in person appointments. I also discussed with the patient that there may be a patient responsible charge related to this service. The patient expressed understanding and verbally consented to this telephonic visit.    Interactive audio and video telecommunications were attempted between this provider and patient, however failed, due to patient having technical difficulties OR patient did not have access to video capability.  We continued and completed visit with audio only.  Some vital signs may be absent or patient reported.   Time Spent with patient on telephone encounter: 30 minutes  Subjective:   Keith [name] is a 80 y.o. male who presents for Medicare Annual/Subsequent preventive examination.  Review of Systems    No ROS. Medicare Wellness Virtual Visit. Additional risk factors are reflected in social history. Cardiac Risk Factors include: advanced age (>11men, >73 women);dyslipidemia;family history of premature cardiovascular disease;male gender     Objective:    There were no vitals filed for this visit. There is no height or weight on file to calculate BMI.  Advanced Directives 06/30/2020 06/06/2019 11/29/2017 11/27/2017  Does Patient Have a Medical Advance Directive? Yes Yes No No  Type of Paramedic of [place];Living will Living will;Healthcare Power of Attorney - -  Does patient want to make changes to medical advance directive? No - Patient declined No - Patient declined - -  Copy of [place] in Chart? No - copy  requested No - copy requested - -  Would patient like information on creating a medical advance directive? - - No - Patient declined No - Patient declined    Current Medications (verified) Outpatient Encounter Medications as of 06/30/2020  Medication Sig  . aspirin EC 81 MG tablet Take 81 mg by mouth daily.  . Calcium Carb-Cholecalciferol (CALCIUM + D3 PO) Take 1 tablet by mouth daily.  . ferrous sulfate 325 (65 FE) MG EC tablet Take 325 mg by mouth daily.  . fluticasone (FLONASE) 50 MCG/ACT nasal spray SHAKE LIQUID AND USE 2 SPRAYS IN EACH NOSTRIL EVERY DAY  . levothyroxine (SYNTHROID) 112 MCG tablet Take 1 tablet (112 mcg total) by mouth daily.  . magnesium citrate SOLN Take 1 Bottle by mouth once.  . Pitavastatin Calcium (LIVALO) 1 MG TABS Take 1 tablet (1 mg total) by mouth daily.   No facility-administered encounter medications on file as of 06/30/2020.    Allergies (verified) Atorvastatin and Rosuvastatin   History: Past Medical History:  Diagnosis Date  . Allergic rhinitis   . Cancer ([place])    SKIN CANCER ON LIP (30 YR AGO)  . DDD (degenerative disc disease), cervical   . Hyperlipidemia   . Hypothyroidism   . PONV (postoperative nausea and vomiting)    2016 BACK SURGERY, IN RECOVERY HE HAD N/V   Past Surgical History:  Procedure Laterality Date  . AMPUTATED Right 1961   TRAUMATIC AMPUTATED FINGERS RIGHT   . BACK SURGERY  2016  . TONSILLECTOMY    . TRANSFORAMINAL LUMBAR INTERBODY FUSION (TLIF) WITH PEDICLE SCREW FIXATION 1 LEVEL Right 11/29/2017   Procedure: RIGHT SIDED LUMBAR 4-5 TRANSFORAMINAL LUMBAR INTERBODY FUSION WITH INSTRUMENTATION  AND ALLOGRAFT;  Surgeon: Phylliss Bob, MD;  Location: [facility];  Service: Orthopedics;  Laterality: Right;   Family History  Problem Relation Age of Onset  . Diabetes Mother   . Coronary artery disease Father        Fatal MI  . Diabetes Father   . Coronary artery disease Other   . Breast cancer Other    Social History    Socioeconomic History  . Marital status: Married    Spouse name: Not on file  . Number of children: Not on file  . Years of education: Not on file  . Highest education level: Not on file  Occupational History  . 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 Occupation: Designer, industrial/product bus  Tobacco Use  . Smoking status: Never Smoker  . Smokeless tobacco: Never Used  Vaping Use  . Vaping Use: Never used  Substance and Sexual Activity  . Alcohol use: No  . Drug use: No  . Sexual activity: Not Currently  Other Topics Concern  . Not on file  Social History Narrative   Regular Exercise -  YES         Social Determinants of Health   Financial Resource Strain: Low Risk   . Difficulty of Paying Living Expenses: Not hard at all  Food Insecurity: No Food Insecurity  . Worried About Charity fundraiser in the Last Year: Never true  . Ran Out of Food in the Last Year: Never true  Transportation Needs: No Transportation Needs  . Lack of Transportation (Medical): No  . Lack of Transportation (Non-Medical): No  Physical Activity: Sufficiently Active  . Days of Exercise per Week: 5 days  . Minutes of Exercise per Session: 30 min  Stress: No Stress Concern Present  . Feeling of Stress : Not at all  Social Connections: Socially Integrated  . Frequency of Communication with Friends and Family: More than three times a week  . Frequency of Social Gatherings with Friends and Family: More than three times a week  . Attends Religious Services: More than 4 times per year  . Active Member of Clubs or Organizations: No  . Attends Archivist Meetings: More than 4 times per year  . Marital Status: Married    Tobacco Counseling Counseling given: Not Answered   Clinical Intake:  Pre-visit preparation completed: Yes  Pain : No/denies pain     Nutritional Risks: None Diabetes: No  How often do you need to have someone help you when you read  instructions, pamphlets, or other written materials from your doctor or pharmacy?: 1 - Never What is the last grade level you completed in school?: High School Graduate  Diabetic? no  Interpreter Needed?: No  Information entered by :: Lisette Abu, LPN   Activities of Daily Living In your present state of health, do you have any difficulty performing the following activities: 06/30/2020  Hearing? N  Vision? N  Difficulty concentrating or making decisions? N  Walking or climbing stairs? N  Dressing or bathing? N  Doing errands, shopping? N  Preparing Food and eating ? N  Using the Toilet? N  In the past six months, have you accidently leaked urine? N  Do you have problems with loss of bowel control? N  Managing your Medications? N  Managing your Finances? N  Housekeeping or managing your Housekeeping? N  Some recent data might be hidden    Patient Care Team: Janith Lima,  MD as PCP - General  Indicate any recent Medical Services you may have received from other than [facility] providers in the past year (date may be approximate).     Assessment:   This is a routine wellness examination for Betzalel.  Hearing/Vision screen No exam data present  Dietary issues and exercise activities discussed: Current Exercise Habits: Home exercise routine, Type of exercise: walking, Time (Minutes): 30, Frequency (Times/Week): 5, Weekly Exercise (Minutes/Week): 150, Intensity: Mild, Exercise limited by: orthopedic condition(s)  Goals Addressed   None    Depression Screen PHQ 2/9 Scores 06/30/2020 06/06/2019 11/05/2018 01/01/2018 12/06/2016 11/04/2014  PHQ - 2 Score 0 0 0 0 4 0  PHQ- 9 Score - - - - 7 -    Fall Risk Fall Risk  06/30/2020 06/06/2019 11/05/2018 01/01/2018 12/06/2016  Falls in the past year? 0 0 0 0 No  Comment - - - - -  Number falls in past yr: 0 0 0 0 -  Injury with Fall? 0 0 0 0 -  Risk for fall due to : No Fall Risks No Fall Risks - - -  Follow up Falls evaluation  completed Falls evaluation completed;Education provided;Falls prevention discussed Falls evaluation completed Falls evaluation completed -    FALL RISK PREVENTION PERTAINING TO THE HOME:  Any stairs in or around the home? Yes  If so, are there any without handrails? No  Home free of loose throw rugs in walkways, pet beds, electrical cords, etc? Yes  Adequate lighting in your home to reduce risk of falls? Yes   ASSISTIVE DEVICES UTILIZED TO PREVENT FALLS:  Life alert? No  Use of a cane, walker or w/c? No  Grab bars in the bathroom? No  Shower chair or bench in shower? No  Elevated toilet seat or a handicapped toilet? No   TIMED UP AND GO:  Was the test performed? No .  Length of time to ambulate 10 feet: 0 sec.   Gait steady and fast without use of assistive device  Cognitive Function: Normal cognitive status assessed by direct observation by this Nurse Health Advisor. No abnormalities found.          Immunizations Immunization History  Administered Date(s) Administered  . Fluad Quad(high Dose 65+) 11/05/2018  . Influenza Split 12/29/2010, 11/09/2011, 11/21/2013  . Influenza Whole 10/06/2009  . Influenza, High Dose Seasonal PF 11/07/2016, 01/01/2018  . Influenza,inj,Quad PF,6+ Mos 10/23/2012, 11/04/2014, 10/01/2015  . Moderna Sars-Covid-2 Vaccination 03/25/2019, 04/23/2019  . Pneumococcal Conjugate-13 03/07/2014  . Pneumococcal Polysaccharide-23 10/23/2012  . Tdap 12/29/2010    TDAP status: Up to date  Flu Vaccine status: Up to date  Pneumococcal vaccine status: Up to date  Covid-19 vaccine status: Completed vaccines  Qualifies for Shingles Vaccine? Yes   Zostavax completed No   Shingrix Completed?: No.    Education has been provided regarding the importance of this vaccine. Patient has been advised to call insurance company to determine out of pocket expense if they have not yet received this vaccine. Advised may also receive vaccine at local pharmacy or Health  Dept. Verbalized acceptance and understanding.  Screening Tests Health Maintenance  Topic Date Due  . Hepatitis C Screening  Never done  . COVID-19 Vaccine (3 - Booster for Moderna series) 09/23/2019  . INFLUENZA VACCINE  09/07/2020  . TETANUS/TDAP  12/28/2020  . PNA vac Low Risk Adult  Completed  . HPV VACCINES  Aged Out    Health Maintenance  Health Maintenance Due  Topic Date Due  .  Hepatitis C Screening  Never done  . COVID-19 Vaccine (3 - Booster for Moderna series) 09/23/2019    Colorectal cancer screening: Type of screening: Cologuard. Completed 05/01/2020. Repeat every 3 years  Lung Cancer Screening: (Low Dose CT Chest recommended if Age 75-80 years, 30 pack-year currently smoking OR have quit w/in 15years.) does not qualify.   Lung Cancer Screening Referral: no  Additional Screening:  Hepatitis C Screening: does qualify; Completed no  Vision Screening: Recommended annual ophthalmology exams for early detection of glaucoma and other disorders of the eye. Is the patient up to date with their annual eye exam?  Yes  Who is the provider or what is the name of the office in which the patient attends annual eye exams? [facility] If pt is not established with a provider, would they like to be referred to a provider to establish care? No .   Dental Screening: Recommended annual dental exams for proper oral hygiene  Community Resource Referral / Chronic Care Management: CRR required this visit?  No   CCM required this visit?  No      Plan:     I have personally reviewed and noted the following in the patient's chart:   . Medical and social history . Use of alcohol, tobacco or illicit drugs  . Current medications and supplements including opioid prescriptions. Patient is not currently taking opioid prescriptions. . Functional ability and status . Nutritional status . Physical activity . Advanced directives . List of other physicians . Hospitalizations,  surgeries, and ER visits in previous 12 months . Vitals . Screenings to include cognitive, depression, and falls . Referrals and appointments  In addition, I have reviewed and discussed with patient certain preventive protocols, quality metrics, and best practice recommendations. A written personalized care plan for preventive services as well as general preventive health recommendations were provided to patient.     Sheral Flow, LPN   [id number]/81/0175   Nurse Notes:  Patient is cogitatively intact. There were no vitals filed for this visit. There is no height or weight on file to calculate BMI.

## 2020-06-30 NOTE — Patient Instructions (Signed)
Keith [name] , Thank you for taking time to come for your Medicare Wellness Visit. I appreciate your ongoing commitment to your health goals. Please review the following plan we discussed and let me know if I can assist you in the future.   Screening recommendations/referrals: Colonoscopy: 05/01/2020 Cologuard (normal results)  Recommended yearly ophthalmology/optometry visit for glaucoma screening and checkup Recommended yearly dental visit for hygiene and checkup  Vaccinations: Influenza vaccine: 10/2019 Pneumococcal vaccine: 10/23/2012, 03/07/2014 Tdap vaccine: 12/29/2010; due every 10 years Shingles vaccine: no record Covid-19: 03/25/2019, 3//16/2021, 01/12/2020  Advanced directives: Please bring a copy of your health care power of attorney and living will to the office at your convenience.  Conditions/risks identified: Yes; Reviewed health maintenance screenings with patient today and relevant education, vaccines, and/or referrals were provided. Please continue to do your personal lifestyle choices by: daily care of teeth and gums, regular physical activity (goal should be 5 days a week for 30 minutes), eat a healthy diet, avoid tobacco and drug use, limiting any alcohol intake, taking a low-dose aspirin (if not allergic or have been advised by your provider otherwise) and taking vitamins and minerals as recommended by your provider. Continue doing brain stimulating activities (puzzles, reading, adult coloring books, staying active) to keep memory sharp. Continue to eat heart healthy diet (full of fruits, vegetables, whole grains, lean protein, water--limit salt, fat, and sugar intake) and increase physical activity as tolerated.  Next appointment: Please schedule your next Medicare Wellness Visit with your Nurse Health Advisor in 1 year by calling [phone].  Preventive Care 79 Years and Older, Male Preventive care refers to lifestyle choices and visits with your health care provider that can  promote health and wellness. What does preventive care include?  A yearly physical exam. This is also called an annual well check.  Dental exams once or twice a year.  Routine eye exams. Ask your health care provider how often you should have your eyes checked.  Personal lifestyle choices, including:  Daily care of your teeth and gums.  Regular physical activity.  Eating a healthy diet.  Avoiding tobacco and drug use.  Limiting alcohol use.  Practicing safe sex.  Taking low doses of aspirin every day.  Taking vitamin and mineral supplements as recommended by your health care provider. What happens during an annual well check? The services and screenings done by your health care provider during your annual well check will depend on your age, overall health, lifestyle risk factors, and family history of disease. Counseling  Your health care provider may ask you questions about your:  Alcohol use.  Tobacco use.  Drug use.  Emotional well-being.  Home and relationship well-being.  Sexual activity.  Eating habits.  History of falls.  Memory and ability to understand (cognition).  Work and work Statistician. Screening  You may have the following tests or measurements:  Height, weight, and BMI.  Blood pressure.  Lipid and cholesterol levels. These may be checked every 5 years, or more frequently if you are over 43 years old.  Skin check.  Lung cancer screening. You may have this screening every year starting at age 49 if you have a 30-pack-year history of smoking and currently smoke or have quit within the past 15 years.  Fecal occult blood test (FOBT) of the stool. You may have this test every year starting at age 52.  Flexible sigmoidoscopy or colonoscopy. You may have a sigmoidoscopy every 5 years or a colonoscopy every 10 years starting at age  50.  Prostate cancer screening. Recommendations will vary depending on your family history and other  risks.  Hepatitis C blood test.  Hepatitis B blood test.  Sexually transmitted disease (STD) testing.  Diabetes screening. This is done by checking your blood sugar (glucose) after you have not eaten for a while (fasting). You may have this done every 1-3 years.  Abdominal aortic aneurysm (AAA) screening. You may need this if you are a current or former smoker.  Osteoporosis. You may be screened starting at age 58 if you are at high risk. Talk with your health care provider about your test results, treatment options, and if necessary, the need for more tests. Vaccines  Your health care provider may recommend certain vaccines, such as:  Influenza vaccine. This is recommended every year.  Tetanus, diphtheria, and acellular pertussis (Tdap, Td) vaccine. You may need a Td booster every 10 years.  Zoster vaccine. You may need this after age 80.  Pneumococcal 13-valent conjugate (PCV13) vaccine. One dose is recommended after age 49.  Pneumococcal polysaccharide (PPSV23) vaccine. One dose is recommended after age 75. Talk to your health care provider about which screenings and vaccines you need and how often you need them. This information is not intended to replace advice given to you by your health care provider. Make sure you discuss any questions you have with your health care provider. Document Released: 02/20/2015 Document Revised: 10/14/2015 Document Reviewed: 11/25/2014 Elsevier Interactive Patient Education  2017 [place] Prevention in the Home Falls can cause injuries. They can happen to people of all ages. There are many things you can do to make your home safe and to help prevent falls. What can I do on the outside of my home?  Regularly fix the edges of walkways and driveways and fix any cracks.  Remove anything that might make you trip as you walk through a door, such as a raised step or threshold.  Trim any bushes or trees on the path to your home.  Use  bright outdoor lighting.  Clear any walking paths of anything that might make someone trip, such as rocks or tools.  Regularly check to see if handrails are loose or broken. Make sure that both sides of any steps have handrails.  Any raised decks and porches should have guardrails on the edges.  Have any leaves, snow, or ice cleared regularly.  Use sand or salt on walking paths during winter.  Clean up any spills in your garage right away. This includes oil or grease spills. What can I do in the bathroom?  Use night lights.  Install grab bars by the toilet and in the tub and shower. Do not use towel bars as grab bars.  Use non-skid mats or decals in the tub or shower.  If you need to sit down in the shower, use a plastic, non-slip stool.  Keep the floor dry. Clean up any water that spills on the floor as soon as it happens.  Remove soap buildup in the tub or shower regularly.  Attach bath mats securely with double-sided non-slip rug tape.  Do not have throw rugs and other things on the floor that can make you trip. What can I do in the bedroom?  Use night lights.  Make sure that you have a light by your bed that is easy to reach.  Do not use any sheets or blankets that are too big for your bed. They should not hang down onto the floor.  Have a firm chair that has side arms. You can use this for support while you get dressed.  Do not have throw rugs and other things on the floor that can make you trip. What can I do in the kitchen?  Clean up any spills right away.  Avoid walking on wet floors.  Keep items that you use a lot in easy-to-reach places.  If you need to reach something above you, use a strong step stool that has a grab bar.  Keep electrical cords out of the way.  Do not use floor polish or wax that makes floors slippery. If you must use wax, use non-skid floor wax.  Do not have throw rugs and other things on the floor that can make you trip. What can  I do with my stairs?  Do not leave any items on the stairs.  Make sure that there are handrails on both sides of the stairs and use them. Fix handrails that are broken or loose. Make sure that handrails are as long as the stairways.  Check any carpeting to make sure that it is firmly attached to the stairs. Fix any carpet that is loose or worn.  Avoid having throw rugs at the top or bottom of the stairs. If you do have throw rugs, attach them to the floor with carpet tape.  Make sure that you have a light switch at the top of the stairs and the bottom of the stairs. If you do not have them, ask someone to add them for you. What else can I do to help prevent falls?  Wear shoes that:  Do not have high heels.  Have rubber bottoms.  Are comfortable and fit you well.  Are closed at the toe. Do not wear sandals.  If you use a stepladder:  Make sure that it is fully opened. Do not climb a closed stepladder.  Make sure that both sides of the stepladder are locked into place.  Ask someone to hold it for you, if possible.  Clearly mark and make sure that you can see:  Any grab bars or handrails.  First and last steps.  Where the edge of each step is.  Use tools that help you move around (mobility aids) if they are needed. These include:  Canes.  Walkers.  Scooters.  Crutches.  Turn on the lights when you go into a dark area. Replace any light bulbs as soon as they burn out.  Set up your furniture so you have a clear path. Avoid moving your furniture around.  If any of your floors are uneven, fix them.  If there are any pets around you, be aware of where they are.  Review your medicines with your doctor. Some medicines can make you feel dizzy. This can increase your chance of falling. Ask your doctor what other things that you can do to help prevent falls. This information is not intended to replace advice given to you by your health care provider. Make sure you  discuss any questions you have with your health care provider. Document Released: 11/20/2008 Document Revised: 07/02/2015 Document Reviewed: 02/28/2014 Elsevier Interactive Patient Education  2017 [employer].

## 2020-07-15 ENCOUNTER — Telehealth: Payer: Self-pay | Admitting: Family Medicine

## 2020-07-15 ENCOUNTER — Other Ambulatory Visit: Payer: Self-pay

## 2020-07-15 DIAGNOSIS — M5416 Radiculopathy, lumbar region: Secondary | ICD-10-CM

## 2020-07-15 NOTE — Telephone Encounter (Signed)
Called patient and let him know the epidural order was placed. He will call [facility]. Also told him about Dr. Thompson Caul recommendation for referral to neurosurgery instead of neurology. Patient would like to move forward. Referral placed.

## 2020-07-15 NOTE — Telephone Encounter (Signed)
Patient called asking if another epidural could be ordered for him?  He also said that he has been having ongoing issues with his legs (since 2017). He notes numbness and a "sloppy" feeling when he walks. He asked if a referral could be sent to a neurology office? Please advise.

## 2020-07-15 NOTE — Telephone Encounter (Signed)
Does he mean neurosurgery then yes, which he has seen before.  Yes order same injection

## 2020-08-04 ENCOUNTER — Emergency Department (HOSPITAL_COMMUNITY): Payer: Medicare PPO

## 2020-08-04 ENCOUNTER — Emergency Department (HOSPITAL_COMMUNITY)
Admission: EM | Admit: 2020-08-04 | Discharge: 2020-08-04 | Disposition: A | Discharge status: Home / Self Care | Payer: Medicare PPO | Attending: Emergency Medicine | Admitting: Emergency Medicine

## 2020-08-04 ENCOUNTER — Telehealth: Payer: Self-pay | Admitting: Internal Medicine

## 2020-08-04 ENCOUNTER — Encounter (HOSPITAL_COMMUNITY): Payer: Self-pay

## 2020-08-04 ENCOUNTER — Other Ambulatory Visit: Payer: Self-pay

## 2020-08-04 DIAGNOSIS — Z85828 Personal history of other malignant neoplasm of skin: Secondary | ICD-10-CM | POA: Diagnosis not present

## 2020-08-04 DIAGNOSIS — R42 Dizziness and giddiness: Secondary | ICD-10-CM | POA: Insufficient documentation

## 2020-08-04 DIAGNOSIS — Z79899 Other long term (current) drug therapy: Secondary | ICD-10-CM | POA: Diagnosis not present

## 2020-08-04 DIAGNOSIS — Z7982 Long term (current) use of aspirin: Secondary | ICD-10-CM | POA: Insufficient documentation

## 2020-08-04 DIAGNOSIS — G319 Degenerative disease of nervous system, unspecified: Secondary | ICD-10-CM | POA: Diagnosis not present

## 2020-08-04 DIAGNOSIS — E039 Hypothyroidism, unspecified: Secondary | ICD-10-CM | POA: Diagnosis not present

## 2020-08-04 LAB — CBC
HCT: 45.2 % (ref 39.0–52.0)
Hemoglobin: 14.8 g/dL (ref 13.0–17.0)
MCH: 30.8 pg (ref 26.0–34.0)
MCHC: 32.7 g/dL (ref 30.0–36.0)
MCV: 94.2 fL (ref 80.0–100.0)
Platelets: 173 10*3/uL (ref 150–400)
RBC: 4.8 MIL/uL (ref 4.22–5.81)
RDW: 13.6 % (ref 11.5–15.5)
WBC: 6.4 10*3/uL (ref 4.0–10.5)
nRBC: 0 % (ref 0.0–0.2)

## 2020-08-04 LAB — COMPREHENSIVE METABOLIC PANEL
ALT: 19 U/L (ref 0–44)
AST: 21 U/L (ref 15–41)
Albumin: 3.4 g/dL — ABNORMAL LOW (ref 3.5–5.0)
Alkaline Phosphatase: 44 U/L (ref 38–126)
Anion gap: 7 (ref 5–15)
BUN: 14 mg/dL (ref 8–23)
CO2: 25 mmol/L (ref 22–32)
Calcium: 8.6 mg/dL — ABNORMAL LOW (ref 8.9–10.3)
Chloride: 108 mmol/L (ref 98–111)
Creatinine, Ser: 0.98 mg/dL (ref 0.61–1.24)
GFR, Estimated: >60 mL/min (ref >60)
Glucose, Bld: 112 mg/dL — ABNORMAL HIGH (ref 70–99)
Potassium: 3.9 mmol/L (ref 3.5–5.1)
Sodium: 140 mmol/L (ref 135–145)
Total Bilirubin: 0.9 mg/dL (ref 0.3–1.2)
Total Protein: 6.3 g/dL — ABNORMAL LOW (ref 6.5–8.1)

## 2020-08-04 NOTE — ED Triage Notes (Signed)
Pt states he had a dizzy spell this morning, had balance issues walking, but then says it cleared up after a couple minutes. Pt states he is not currently experiencing any issues but wants to be checked out.

## 2020-08-04 NOTE — ED Notes (Signed)
Patient is being taken for his MRI.

## 2020-08-04 NOTE — Telephone Encounter (Signed)
Patient had a episode of blurry vision, dizziness, and weakness for 5 mins yesterday. Transferred to triage. Patient called back to see if he could get an appointment. Next available is on 08/06/2020 for PCP.   Please advise next steps.

## 2020-08-04 NOTE — Telephone Encounter (Signed)
Per verbal instruction from PCP. He wants the pt to go to the ED for evaluation of a stroke. I have called the pt and informed him of the recommendation from PCP. He stated the he feels fine now but he would go.

## 2020-08-04 NOTE — ED Provider Notes (Signed)
[facility] DEPT Provider Note   CSN: [id number] Arrival date & time: 08/04/20  1042     History Chief Complaint  Patient presents with   Dizziness    Keith [name] is a 80 y.o. male.  Patient presents to the emergency department after being told to come here by PCP.  He reportedly had an episode of dizziness which lasted 3 to 5 minutes.  He reports he was sitting watching TV and all of a sudden he felt like his eyes were shaking.  He reports then he stood up and the entire room began to spin around him.  He reports history of vertigo and that it felt similar to that but that it was considerably worse than any vertigo episode he is ever had.  He sat down again and waited for 3 to 5 minutes and it finally resolved.  This symptom has not returned since the original occurrence.  He denies any positional changes that can elicit this dizziness other than standing up.  He does acknowledge that he has peripheral neuropathy in his feet and has difficulty feeling when he walks so his ambulation has worsened over the past 6 months to a year.  Denies any loss of bowel or bladder function.  Denies any nausea, vomiting, diarrhea, constipation.  Denies any drug use.      Past Medical History:  Diagnosis Date   Allergic rhinitis    Cancer ([place])    SKIN CANCER ON LIP (30 YR AGO)   DDD (degenerative disc disease), cervical    Hyperlipidemia    Hypothyroidism    PONV (postoperative nausea and vomiting)    2016 BACK SURGERY, IN RECOVERY HE HAD N/V    Patient Active Problem List   Diagnosis Date Noted   Dizziness 03/03/2020   Lumbar radiculopathy, right 09/18/2019   AC (acromioclavicular) arthritis 10/11/2018   Routine general medical examination at a health care facility 12/07/2016   Senile purpura ([place]) 01/14/2015   Arthritis of sacroiliac joint 08/27/2014   Renal cyst 05/11/2014   CMC arthritis, thumb, degenerative 07/24/2013   Prostate nodule 12/15/2011    Osteoarthritis of hip 10/06/2009   Hypothyroidism 10/15/2008   Hyperlipidemia with target LDL less than 130 03/03/2008   Allergic rhinitis 03/03/2008    Past Surgical History:  Procedure Laterality Date   AMPUTATED Right 1961   TRAUMATIC AMPUTATED FINGERS RIGHT    BACK SURGERY  2016   TONSILLECTOMY     TRANSFORAMINAL LUMBAR INTERBODY FUSION (TLIF) WITH PEDICLE SCREW FIXATION 1 LEVEL Right 11/29/2017   Procedure: RIGHT SIDED LUMBAR 4-5 TRANSFORAMINAL LUMBAR INTERBODY FUSION WITH INSTRUMENTATION AND ALLOGRAFT;  Surgeon: Phylliss Bob, MD;  Location: [facility];  Service: Orthopedics;  Laterality: Right;       Family History  Problem Relation Age of Onset   Diabetes Mother    Coronary artery disease Father        Fatal MI   Diabetes Father    Coronary artery disease Other    Breast cancer Other     Social History   Tobacco Use   Smoking status: Never   Smokeless tobacco: Never  Vaping Use   Vaping Use: Never used  Substance Use Topics   Alcohol use: No   Drug use: No    Home Medications Prior to Admission medications   Medication Sig Start Date End Date Taking? Authorizing Provider  aspirin EC 81 MG tablet Take 81 mg by mouth daily.    [provider]  Calcium Carb-Cholecalciferol (CALCIUM + D3 PO) Take 1 tablet by mouth daily.    [provider]  ferrous sulfate 325 (65 FE) MG EC tablet Take 325 mg by mouth daily.    [provider]  fluticasone (FLONASE) 50 MCG/ACT nasal spray SHAKE LIQUID AND USE 2 SPRAYS IN EACH NOSTRIL EVERY DAY 09/08/19   Janith Lima, MD  levothyroxine (SYNTHROID) 112 MCG tablet Take 1 tablet (112 mcg total) by mouth daily. 03/04/20   Janith Lima, MD  magnesium citrate SOLN Take 1 Bottle by mouth once.    [provider]  Pitavastatin Calcium (LIVALO) 1 MG TABS Take 1 tablet (1 mg total) by mouth daily. 03/03/20   Janith Lima, MD    Allergies    Atorvastatin and Rosuvastatin  Review of Systems   Review  of Systems  Constitutional:  Negative for chills, fatigue and fever.  HENT:  Negative for congestion and rhinorrhea.   Eyes:  Negative for visual disturbance.  Respiratory:  Negative for chest tightness and shortness of breath.   Cardiovascular:  Negative for chest pain.  Gastrointestinal:  Negative for abdominal pain, constipation, diarrhea, nausea and vomiting.  Endocrine: Negative for polyuria.  Genitourinary:  Negative for decreased urine volume.  Musculoskeletal:  Negative for arthralgias.  Skin:  Negative for rash.  Neurological:  Positive for dizziness and light-headedness. Negative for syncope, speech difficulty, weakness and headaches.   Physical Exam Updated Vital Signs BP 130/89   Pulse 60   Temp 98.1 F (36.7 C) (Oral)   Resp 16   SpO2 100%   Physical Exam Constitutional:      General: He is not in acute distress. HENT:     Head: Normocephalic and atraumatic.     Nose: Nose normal. No congestion or rhinorrhea.     Mouth/Throat:     Pharynx: No oropharyngeal exudate or posterior oropharyngeal erythema.  Eyes:     Extraocular Movements: Extraocular movements intact.     Pupils: Pupils are equal, round, and reactive to light.     Comments: No nystagmus noted  Cardiovascular:     Rate and Rhythm: Normal rate and regular rhythm.  Pulmonary:     Effort: Pulmonary effort is normal.     Breath sounds: Normal breath sounds.  Abdominal:     General: Abdomen is flat. There is no distension.     Tenderness: There is no abdominal tenderness.  Musculoskeletal:        General: Normal range of motion.     Cervical back: Normal range of motion and neck supple.  Skin:    General: Skin is warm and dry.     Capillary Refill: Capillary refill takes less than 2 seconds.  Neurological:     General: No focal deficit present.     Mental Status: He is alert and oriented to person, place, and time. Mental status is at baseline.     Cranial Nerves: No cranial nerve deficit.      Sensory: No sensory deficit.     Motor: No weakness.     Coordination: Coordination normal.     Deep Tendon Reflexes: Reflexes normal.     Comments: Negative Dix-Hallpike maneuver.  Patient did report dizziness when going from a sitting to standing but not when going from lying to sitting.  Psychiatric:        Mood and Affect: Mood normal.    ED Results / Procedures / Treatments   Labs (all labs ordered are listed, but  only abnormal results are displayed) Labs Reviewed - No data to display  EKG None  Radiology No results found.  Procedures Procedures   Medications Ordered in ED Medications - No data to display  ED Course  I have reviewed the triage vital signs and the nursing notes.  Pertinent labs & imaging results that were available during my care of the patient were reviewed by me and considered in my medical decision making (see chart for details).    MDM Rules/Calculators/A&P                          80 year old male presenting for a single episode of dizziness which lasted 3 to 5 minutes.  Past medical history of vertigo.  Also reports has had decreased p.o. fluid intake over the last few days.  On arrival to the emergency department patient's vital signs are stable and his symptoms have completely resolved.  He was here because his PCP requested that he come for further evaluation.  Physical exam was reassuring and only showed dizziness from sitting to standing but was negative for Dix-Hallpike.  Orthostatic vital signs within normal limits.  CMP showed no gross abnormalities.  CBC within normal limits.  EKG shows sinus rhythm.  Brain MRI shows no acute abnormality.  Atrophy and chronic microvascular ischemia changes in the white matter with progression since 2016.  Discussed results with patient and he will follow-up with his PCP.  Patient discharged with strict return precautions.  Final Clinical Impression(s) / ED Diagnoses Final diagnoses:  None    Rx / DC  Orders ED Discharge Orders     None        Gifford Shave, MD 08/04/20 1419    Lucrezia Starch, MD 08/05/20 [phone]

## 2020-08-04 NOTE — Discharge Instructions (Addendum)
It was a pleasure meeting you today.  I am glad that your dizziness is resolved.  We collected some lab work today which came back completely normal.  We also collected an MRI of your brain which did not show any strokes.  I recommend you follow-up with your primary care provider for further work-up as needed.  Please be careful with ambulation if you have return of the dizziness and make sure you drink plenty of fluids.  I have you have a wonderful afternoon!

## 2020-08-26 ENCOUNTER — Other Ambulatory Visit: Payer: Self-pay | Admitting: Internal Medicine

## 2020-08-26 DIAGNOSIS — E785 Hyperlipidemia, unspecified: Secondary | ICD-10-CM

## 2020-08-26 DIAGNOSIS — E039 Hypothyroidism, unspecified: Secondary | ICD-10-CM

## 2020-08-31 ENCOUNTER — Telehealth: Payer: Self-pay

## 2020-08-31 NOTE — Telephone Encounter (Signed)
Pt stated that he would like to keep his 8/4 scheduled OV with PCP.

## 2020-08-31 NOTE — Telephone Encounter (Signed)
pt has stated he is in need of a rx refill for Livalo '1mg'$  tab, Levothyroxine 112MCG. Pt is almost out and needs a refill.

## 2020-09-03 DIAGNOSIS — Z981 Arthrodesis status: Secondary | ICD-10-CM | POA: Diagnosis not present

## 2020-09-03 DIAGNOSIS — M5416 Radiculopathy, lumbar region: Secondary | ICD-10-CM | POA: Diagnosis not present

## 2020-09-03 DIAGNOSIS — Z6828 Body mass index (BMI) 28.0-28.9, adult: Secondary | ICD-10-CM | POA: Diagnosis not present

## 2020-09-09 ENCOUNTER — Other Ambulatory Visit: Payer: Self-pay | Admitting: Student

## 2020-09-09 DIAGNOSIS — Z981 Arthrodesis status: Secondary | ICD-10-CM

## 2020-09-10 ENCOUNTER — Encounter: Payer: Self-pay | Admitting: Internal Medicine

## 2020-09-10 ENCOUNTER — Other Ambulatory Visit: Payer: Self-pay

## 2020-09-10 ENCOUNTER — Ambulatory Visit: Payer: Medicare PPO | Admitting: Internal Medicine

## 2020-09-10 VITALS — BP 120/72 | HR 68 | Temp 100.0°F | Ht 70.0 in | Wt 198.0 lb

## 2020-09-10 DIAGNOSIS — H538 Other visual disturbances: Secondary | ICD-10-CM | POA: Insufficient documentation

## 2020-09-10 DIAGNOSIS — M7022 Olecranon bursitis, left elbow: Secondary | ICD-10-CM | POA: Diagnosis not present

## 2020-09-10 DIAGNOSIS — E039 Hypothyroidism, unspecified: Secondary | ICD-10-CM

## 2020-09-10 DIAGNOSIS — N402 Nodular prostate without lower urinary tract symptoms: Secondary | ICD-10-CM | POA: Diagnosis not present

## 2020-09-10 DIAGNOSIS — Z0000 Encounter for general adult medical examination without abnormal findings: Secondary | ICD-10-CM | POA: Diagnosis not present

## 2020-09-10 LAB — PSA: PSA: 4.78 ng/mL — ABNORMAL HIGH (ref 0.10–4.00)

## 2020-09-10 LAB — TSH: TSH: 1.41 u[IU]/mL (ref 0.35–5.50)

## 2020-09-10 NOTE — Progress Notes (Signed)
Keith [name]    Subjective:  Patient ID: Keith [name], male    DOB: Jan 19, 1941  Age: 80 y.o. MRN: [id number]  CC: Annual Exam and Hypothyroidism  This visit occurred during the SARS-CoV-2 public health emergency.  Safety protocols were in place, including screening questions prior to the visit, additional usage of staff PPE, and extensive cleaning of exam room while observing appropriate contact time as indicated for disinfecting solutions.    HPI [name] presents for a CPX and f/up -  He continues to complain of blurred vision and now has tearing from his right eye.  He complains that he fell 2 weeks ago and has an area of swelling over the tip of his left elbow.  He has good range of motion in the elbow.  He is active and denies any recent episodes of chest pain, shortness of breath, palpitations, dizziness, or lightheadedness.  Outpatient Medications Prior to Visit  Medication Sig Dispense Refill   ferrous sulfate 325 (65 FE) MG EC tablet Take 325 mg by mouth daily.     fluticasone (FLONASE) 50 MCG/ACT nasal spray SHAKE LIQUID AND USE 2 SPRAYS IN EACH NOSTRIL EVERY DAY 16 g 11   levothyroxine (SYNTHROID) 112 MCG tablet TAKE 1 TABLET(112 MCG) BY MOUTH DAILY 90 tablet 1   LIVALO 1 MG TABS TAKE 1 TABLET(1 MG) BY MOUTH DAILY 90 tablet 0   magnesium citrate SOLN Take 1 Bottle by mouth once.     aspirin EC 81 MG tablet Take 81 mg by mouth daily.     Calcium Carb-Cholecalciferol (CALCIUM + D3 PO) Take 1 tablet by mouth daily.     No facility-administered medications prior to visit.    ROS Review of Systems  Constitutional:  Negative for appetite change, diaphoresis, fatigue and unexpected weight change.  HENT:  Positive for postnasal drip and rhinorrhea. Negative for nosebleeds and sinus pressure.   Eyes:  Positive for visual disturbance. Negative for photophobia.  Respiratory: Negative.  Negative for cough, chest tightness, shortness of breath and wheezing.   Cardiovascular:   Negative for chest pain, palpitations and leg swelling.  Gastrointestinal:  Negative for abdominal pain, constipation, nausea and vomiting.  Endocrine: Negative for cold intolerance and heat intolerance.  Genitourinary:  Negative for difficulty urinating.  Musculoskeletal:  Positive for back pain and joint swelling. Negative for arthralgias.  Skin: Negative.  Negative for color change and pallor.  Neurological: Negative.  Negative for dizziness, weakness, light-headedness and headaches.  Hematological:  Negative for adenopathy. Does not bruise/bleed easily.  Psychiatric/Behavioral: Negative.     Objective:  BP 120/72 (BP Location: Left Arm, Patient Position: Sitting, Cuff Size: Large)   Pulse 68   Temp 100 F (37.8 C) (Oral)   Ht '5\' 10"'$  (1.778 m)   Wt 198 lb (89.8 kg)   SpO2 97%   BMI 28.41 kg/m   BP Readings from Last 3 Encounters:  09/10/20 120/72  08/04/20 140/78  03/03/20 122/78    Wt Readings from Last 3 Encounters:  09/10/20 198 lb (89.8 kg)  03/03/20 201 lb (91.2 kg)  12/24/19 200 lb (90.7 kg)    Physical Exam Vitals reviewed.  Constitutional:      Appearance: Normal appearance.  HENT:     Nose: Nose normal.     Mouth/Throat:     Mouth: Mucous membranes are moist.  Eyes:     Conjunctiva/sclera: Conjunctivae normal.  Cardiovascular:     Rate and Rhythm: Normal rate and regular rhythm.  Pulses: Normal pulses.     Heart sounds: No murmur heard. Pulmonary:     Effort: Pulmonary effort is normal.     Breath sounds: No stridor. No wheezing, rhonchi or rales.  Abdominal:     General: Abdomen is flat.     Palpations: There is no mass.     Tenderness: There is no abdominal tenderness. There is no guarding.  Musculoskeletal:        General: Normal range of motion.     Right elbow: Normal.     Left elbow: Swelling present. No deformity. Normal range of motion. No tenderness.     Cervical back: Neck supple.     Right lower leg: No edema.     Left lower leg:  No edema.     Comments: There is a fluid accumulation over the left olecranon bursa.  Lymphadenopathy:     Cervical: No cervical adenopathy.  Skin:    General: Skin is warm and dry.     Coloration: Skin is not jaundiced.  Neurological:     General: No focal deficit present.     Mental Status: He is alert.  Psychiatric:        Mood and Affect: Mood normal.        Behavior: Behavior normal.    Lab Results  Component Value Date   WBC 6.4 08/04/2020   HGB 14.8 08/04/2020   HCT 45.2 08/04/2020   PLT 173 08/04/2020   GLUCOSE 112 (H) 08/04/2020   CHOL 159 03/03/2020   TRIG 140.0 03/03/2020   HDL 37.50 (L) 03/03/2020   LDLCALC 93 03/03/2020   ALT 19 08/04/2020   AST 21 08/04/2020   NA 140 08/04/2020   K 3.9 08/04/2020   CL 108 08/04/2020   CREATININE 0.98 08/04/2020   BUN 14 08/04/2020   CO2 25 08/04/2020   TSH 1.41 09/10/2020   PSA 4.78 (H) 09/10/2020   INR 1.01 11/27/2017   HGBA1C 5.9 01/01/2018    [name] CONTRAST  Result Date: 08/04/2020 CLINICAL DATA:  Dizziness EXAM: MRI HEAD WITHOUT CONTRAST TECHNIQUE: Multiplanar, multiecho pulse sequences of the brain and surrounding structures were obtained without intravenous contrast. COMPARISON:  MRI head 01/12/2015 FINDINGS: Brain: Mild generalized cerebral atrophy. Moderate white matter changes with scattered small periventricular and deep white matter hyperintensities. Mild progression since 2016 Negative for acute infarct, hemorrhage, mass. Negative for hydrocephalus Vascular: Normal arterial flow voids. Skull and upper cervical spine: Negative Sinuses/Orbits: Negative Other: None IMPRESSION: No acute abnormality. Atrophy and chronic microvascular ischemic change in the white matter, with progression since 2016. Electronically Signed   By: Franchot Gallo M.D.   On: 08/04/2020 14:05    Assessment & Plan:   Cletis was seen today for annual exam and hypothyroidism.  Diagnoses and all orders for this visit:  Acquired  hypothyroidism- His TSH is in the normal range.  He will stay on the current dose of levothyroxine. -     TSH; Future -     TSH  Blurred vision, bilateral -     Ambulatory referral to Ophthalmology  Prostate nodule- His PSA is not rising.  This is a reassuring sign that he does not have prostate cancer. -     PSA; Future -     PSA  Routine general medical examination at a health care facility- Exam completed, labs reviewed, vaccines are up-to-date, no cancer screenings indicated, patient education was given.  Olecranon bursitis of left elbow -  Ambulatory referral to Orthopedic Surgery  I have discontinued Jori Moll A. Yoakum's Calcium Carb-Cholecalciferol (CALCIUM + D3 PO) and aspirin EC. I am also having him maintain his ferrous sulfate, magnesium citrate, fluticasone, Livalo, and levothyroxine.  No orders of the defined types were placed in this encounter.    Follow-up: Return in about 6 months (around 03/13/2021).  Scarlette Calico, MD

## 2020-09-10 NOTE — Patient Instructions (Signed)
Health Maintenance, Male Adopting a healthy lifestyle and getting preventive care are important in promoting health and wellness. Ask your health care provider about: The right schedule for you to have regular tests and exams. Things you can do on your own to prevent diseases and keep yourself healthy. What should I know about diet, weight, and exercise? Eat a healthy diet  Eat a diet that includes plenty of vegetables, fruits, low-fat dairy products, and lean protein. Do not eat a lot of foods that are high in solid fats, added sugars, or sodium.  Maintain a healthy weight Body mass index (BMI) is a measurement that can be used to identify possible weight problems. It estimates body fat based on height and weight. Your health care provider can help determine your BMI and help you achieve or maintain ahealthy weight. Get regular exercise Get regular exercise. This is one of the most important things you can do for your health. Most adults should: Exercise for at least 150 minutes each week. The exercise should increase your heart rate and make you sweat (moderate-intensity exercise). Do strengthening exercises at least twice a week. This is in addition to the moderate-intensity exercise. Spend less time sitting. Even light physical activity can be beneficial. Watch cholesterol and blood lipids Have your blood tested for lipids and cholesterol at 79 years of age, then havethis test every 5 years. You may need to have your cholesterol levels checked more often if: Your lipid or cholesterol levels are high. You are older than 79 years of age. You are at high risk for heart disease. What should I know about cancer screening? Many types of cancers can be detected early and may often be prevented. Depending on your health history and family history, you may need to have cancer screening at various ages. This may include screening for: Colorectal cancer. Prostate cancer. Skin cancer. Lung  cancer. What should I know about heart disease, diabetes, and high blood pressure? Blood pressure and heart disease High blood pressure causes heart disease and increases the risk of stroke. This is more likely to develop in people who have high blood pressure readings, are of African descent, or are overweight. Talk with your health care provider about your target blood pressure readings. Have your blood pressure checked: Every 3-5 years if you are 18-39 years of age. Every year if you are 40 years old or older. If you are between the ages of 65 and 75 and are a current or former smoker, ask your health care provider if you should have a one-time screening for abdominal aortic aneurysm (AAA). Diabetes Have regular diabetes screenings. This checks your fasting blood sugar level. Have the screening done: Once every three years after age 45 if you are at a normal weight and have a low risk for diabetes. More often and at a younger age if you are overweight or have a high risk for diabetes. What should I know about preventing infection? Hepatitis B If you have a higher risk for hepatitis B, you should be screened for this virus. Talk with your health care provider to find out if you are at risk forhepatitis B infection. Hepatitis C Blood testing is recommended for: Everyone born from 1945 through 1965. Anyone with known risk factors for hepatitis C. Sexually transmitted infections (STIs) You should be screened each year for STIs, including gonorrhea and chlamydia, if: You are sexually active and are younger than 79 years of age. You are older than 79 years of age   and your health care provider tells you that you are at risk for this type of infection. Your sexual activity has changed since you were last screened, and you are at increased risk for chlamydia or gonorrhea. Ask your health care provider if you are at risk. Ask your health care provider about whether you are at high risk for HIV.  Your health care provider may recommend a prescription medicine to help prevent HIV infection. If you choose to take medicine to prevent HIV, you should first get tested for HIV. You should then be tested every 3 months for as long as you are taking the medicine. Follow these instructions at home: Lifestyle Do not use any products that contain nicotine or tobacco, such as cigarettes, e-cigarettes, and chewing tobacco. If you need help quitting, ask your health care provider. Do not use street drugs. Do not share needles. Ask your health care provider for help if you need support or information about quitting drugs. Alcohol use Do not drink alcohol if your health care provider tells you not to drink. If you drink alcohol: Limit how much you have to 0-2 drinks a day. Be aware of how much alcohol is in your drink. In the [place] one drink equals one 12 oz bottle of beer (355 mL), one 5 oz glass of wine (148 mL), or one 1 oz glass of hard liquor (44 mL). General instructions Schedule regular health, dental, and eye exams. Stay current with your vaccines. Tell your health care provider if: You often feel depressed. You have ever been abused or do not feel safe at home. Summary Adopting a healthy lifestyle and getting preventive care are important in promoting health and wellness. Follow your health care provider's instructions about healthy diet, exercising, and getting tested or screened for diseases. Follow your health care provider's instructions on monitoring your cholesterol and blood pressure. This information is not intended to replace advice given to you by your health care provider. Make sure you discuss any questions you have with your healthcare provider. Document Revised: 01/17/2018 Document Reviewed: 01/17/2018 Elsevier Patient Education  2022 [employer]  

## 2020-09-14 ENCOUNTER — Telehealth: Payer: Self-pay | Admitting: Internal Medicine

## 2020-09-14 NOTE — Telephone Encounter (Signed)
   Patient declined opthalmology referral  He states this issue was "dry eyes"

## 2020-09-28 ENCOUNTER — Ambulatory Visit
Admission: RE | Admit: 2020-09-28 | Discharge: 2020-09-28 | Disposition: A | Discharge status: Home / Self Care | Payer: Medicare PPO | Source: Ambulatory Visit | Attending: Student | Admitting: Student

## 2020-09-28 ENCOUNTER — Other Ambulatory Visit: Payer: Self-pay

## 2020-09-28 DIAGNOSIS — R200 Anesthesia of skin: Secondary | ICD-10-CM | POA: Diagnosis not present

## 2020-09-28 DIAGNOSIS — M5127 Other intervertebral disc displacement, lumbosacral region: Secondary | ICD-10-CM | POA: Diagnosis not present

## 2020-09-28 DIAGNOSIS — M2578 Osteophyte, vertebrae: Secondary | ICD-10-CM | POA: Diagnosis not present

## 2020-09-28 DIAGNOSIS — Z981 Arthrodesis status: Secondary | ICD-10-CM

## 2020-09-28 DIAGNOSIS — M5136 Other intervertebral disc degeneration, lumbar region: Secondary | ICD-10-CM | POA: Diagnosis not present

## 2020-09-30 ENCOUNTER — Ambulatory Visit: Payer: Medicare PPO | Admitting: Orthopaedic Surgery

## 2020-09-30 ENCOUNTER — Other Ambulatory Visit: Payer: Self-pay

## 2020-09-30 DIAGNOSIS — M7022 Olecranon bursitis, left elbow: Secondary | ICD-10-CM | POA: Diagnosis not present

## 2020-09-30 NOTE — Progress Notes (Signed)
Office Visit Note   Patient: Keith [name]           Date of Birth: 1940/05/12           MRN: [id number] Visit Date: 09/30/2020              Requested by: Janith Lima, MD [address] [place],  [place] [address] PCP: Janith Lima, MD   Assessment & Plan: Visit Diagnoses:  1. Olecranon bursitis of left elbow     Plan: Impression is nearly resolved left elbow olecranon bursitis.  The patient symptoms have significantly improved and are almost nonexistent.  At this point, I do not feel it is necessary to order x-rays or aspirate or compress the olecranon bursa.  I think this will continue to resolve with time.  Should his symptoms worsen he will let us know.  Otherwise, follow-up with [place] as needed.  Follow-Up Instructions: Return if symptoms worsen or fail to improve.   Orders:  No orders of the defined types were placed in this encounter.  No orders of the defined types were placed in this encounter.     Procedures: No procedures performed   Clinical Data: No additional findings.   Subjective: Chief Complaint  Patient presents with   Left Elbow - Pain    HPI patient is a very pleasant 80 year old right-hand-dominant gentleman who comes in today for follow-up of the left elbow.  About 7 weeks ago, he fell in his driveway scratching the olecranon.  About a week later he noticed swelling to the area.  He was seen by his primary care provider where he was referred to [place].  Over the past 6 weeks, the swelling has nearly resolved.  He denies ever having any pain to the elbow.  No fevers or chills.  He was never put on antibiotics or had this aspirated by his primary care provider.  Review of Systems as detailed in HPI.  All others reviewed and are negative.   Objective: Vital Signs: There were no vitals taken for this visit.  Physical Exam well-developed well-nourished gentleman in no acute distress.  Alert and oriented x3.  Ortho Exam left elbow exam shows very  minimal fluctuance to the olecranon bursa.  This is nontender.  No erythema or signs of infection or cellulitis.  He does have a scab overlying the lateral aspect from the previous great.  Full range of motion of the elbow without pain.  He is neurovascular intact distally.  Specialty Comments:  No specialty comments available.  Imaging: No new imaging   PMFS History: Patient Active Problem List   Diagnosis Date Noted   Blurred vision, bilateral 09/10/2020   Olecranon bursitis of left elbow 09/10/2020   Lumbar radiculopathy, right 09/18/2019   AC (acromioclavicular) arthritis 10/11/2018   Routine general medical examination at a health care facility 12/07/2016   Senile purpura ([place]) 01/14/2015   Arthritis of sacroiliac joint 08/27/2014   Renal cyst 05/11/2014   CMC arthritis, thumb, degenerative 07/24/2013   Prostate nodule 12/15/2011   Osteoarthritis of hip 10/06/2009   Hypothyroidism 10/15/2008   Hyperlipidemia with target LDL less than 130 03/03/2008   Allergic rhinitis 03/03/2008   Past Medical History:  Diagnosis Date   Allergic rhinitis    Cancer ([place])    SKIN CANCER ON LIP (30 YR AGO)   DDD (degenerative disc disease), cervical    Hyperlipidemia    Hypothyroidism    PONV (postoperative nausea and vomiting)  2016 BACK SURGERY, IN RECOVERY HE HAD N/V    Family History  Problem Relation Age of Onset   Diabetes Mother    Coronary artery disease Father        Fatal MI   Diabetes Father    Coronary artery disease Other    Breast cancer Other     Past Surgical History:  Procedure Laterality Date   AMPUTATED Right 1961   TRAUMATIC AMPUTATED FINGERS RIGHT    BACK SURGERY  2016   TONSILLECTOMY     TRANSFORAMINAL LUMBAR INTERBODY FUSION (TLIF) WITH PEDICLE SCREW FIXATION 1 LEVEL Right 11/29/2017   Procedure: RIGHT SIDED LUMBAR 4-5 TRANSFORAMINAL LUMBAR INTERBODY FUSION WITH INSTRUMENTATION AND ALLOGRAFT;  Surgeon: Phylliss Bob, MD;  Location: [facility];  Service:  Orthopedics;  Laterality: Right;   Social History   Occupational History   Occupation: Retired Nurse, 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

## 2020-11-05 ENCOUNTER — Other Ambulatory Visit: Payer: Medicare PPO

## 2020-11-09 ENCOUNTER — Ambulatory Visit
Admission: RE | Admit: 2020-11-09 | Discharge: 2020-11-09 | Disposition: A | Discharge status: Home / Self Care | Payer: Medicare PPO | Source: Ambulatory Visit | Attending: Family Medicine | Admitting: Family Medicine

## 2020-11-09 ENCOUNTER — Other Ambulatory Visit: Payer: Self-pay

## 2020-11-09 DIAGNOSIS — M47817 Spondylosis without myelopathy or radiculopathy, lumbosacral region: Secondary | ICD-10-CM | POA: Diagnosis not present

## 2020-11-09 DIAGNOSIS — M5416 Radiculopathy, lumbar region: Secondary | ICD-10-CM

## 2020-11-09 MED ORDER — METHYLPREDNISOLONE ACETATE 40 MG/ML INJ SUSP (RADIOLOG
80.0000 mg | Freq: Once | INTRAMUSCULAR | Status: AC
  Administered 2020-11-09: 80 mg via EPIDURAL

## 2020-11-09 MED ORDER — IOPAMIDOL (ISOVUE-M 200) INJECTION 41%
1.0000 mL | Freq: Once | INTRAMUSCULAR | Status: AC
  Administered 2020-11-09: 1 mL via EPIDURAL

## 2020-11-09 NOTE — Discharge Instructions (Signed)

## 2020-11-24 DIAGNOSIS — M5416 Radiculopathy, lumbar region: Secondary | ICD-10-CM | POA: Diagnosis not present

## 2020-11-26 ENCOUNTER — Other Ambulatory Visit: Payer: Self-pay | Admitting: Internal Medicine

## 2020-11-26 DIAGNOSIS — E785 Hyperlipidemia, unspecified: Secondary | ICD-10-CM

## 2020-11-26 DIAGNOSIS — M5416 Radiculopathy, lumbar region: Secondary | ICD-10-CM | POA: Diagnosis not present

## 2020-11-26 DIAGNOSIS — Z6828 Body mass index (BMI) 28.0-28.9, adult: Secondary | ICD-10-CM | POA: Diagnosis not present

## 2020-11-26 DIAGNOSIS — R030 Elevated blood-pressure reading, without diagnosis of hypertension: Secondary | ICD-10-CM | POA: Diagnosis not present

## 2021-01-10 DIAGNOSIS — J1000 Influenza due to other identified influenza virus with unspecified type of pneumonia: Secondary | ICD-10-CM | POA: Diagnosis not present

## 2021-01-11 ENCOUNTER — Other Ambulatory Visit: Payer: Self-pay | Admitting: Internal Medicine

## 2021-01-11 ENCOUNTER — Ambulatory Visit: Payer: Medicare PPO | Admitting: Nurse Practitioner

## 2021-01-11 DIAGNOSIS — J309 Allergic rhinitis, unspecified: Secondary | ICD-10-CM

## 2021-01-11 MED ORDER — FLUTICASONE PROPIONATE 50 MCG/ACT NA SUSP: 2.0000 | Freq: Every day | NASAL | 1 refills | Status: DC

## 2021-01-17 DIAGNOSIS — J189 Pneumonia, unspecified organism: Secondary | ICD-10-CM | POA: Diagnosis not present

## 2021-01-27 DIAGNOSIS — Z89021 Acquired absence of right finger(s): Secondary | ICD-10-CM | POA: Diagnosis not present

## 2021-01-27 DIAGNOSIS — Z833 Family history of diabetes mellitus: Secondary | ICD-10-CM | POA: Diagnosis not present

## 2021-01-27 DIAGNOSIS — E785 Hyperlipidemia, unspecified: Secondary | ICD-10-CM | POA: Diagnosis not present

## 2021-01-27 DIAGNOSIS — E039 Hypothyroidism, unspecified: Secondary | ICD-10-CM | POA: Diagnosis not present

## 2021-01-27 DIAGNOSIS — Z8249 Family history of ischemic heart disease and other diseases of the circulatory system: Secondary | ICD-10-CM | POA: Diagnosis not present

## 2021-01-27 DIAGNOSIS — Z823 Family history of stroke: Secondary | ICD-10-CM | POA: Diagnosis not present

## 2021-01-27 DIAGNOSIS — Z803 Family history of malignant neoplasm of breast: Secondary | ICD-10-CM | POA: Diagnosis not present

## 2021-01-27 DIAGNOSIS — Z85828 Personal history of other malignant neoplasm of skin: Secondary | ICD-10-CM | POA: Diagnosis not present

## 2021-01-30 ENCOUNTER — Encounter (HOSPITAL_COMMUNITY): Payer: Self-pay

## 2021-01-30 ENCOUNTER — Other Ambulatory Visit: Payer: Self-pay

## 2021-01-30 ENCOUNTER — Emergency Department (HOSPITAL_COMMUNITY)
Admission: EM | Admit: 2021-01-30 | Discharge: 2021-01-30 | Disposition: A | Discharge status: Home / Self Care | Payer: Medicare PPO | Attending: Emergency Medicine | Admitting: Emergency Medicine

## 2021-01-30 ENCOUNTER — Emergency Department (HOSPITAL_COMMUNITY): Payer: Medicare PPO

## 2021-01-30 DIAGNOSIS — E039 Hypothyroidism, unspecified: Secondary | ICD-10-CM | POA: Insufficient documentation

## 2021-01-30 DIAGNOSIS — Z20822 Contact with and (suspected) exposure to covid-19: Secondary | ICD-10-CM | POA: Diagnosis not present

## 2021-01-30 DIAGNOSIS — Z85828 Personal history of other malignant neoplasm of skin: Secondary | ICD-10-CM | POA: Diagnosis not present

## 2021-01-30 DIAGNOSIS — R0602 Shortness of breath: Secondary | ICD-10-CM | POA: Diagnosis present

## 2021-01-30 DIAGNOSIS — R051 Acute cough: Secondary | ICD-10-CM | POA: Diagnosis not present

## 2021-01-30 DIAGNOSIS — J90 Pleural effusion, not elsewhere classified: Secondary | ICD-10-CM | POA: Diagnosis not present

## 2021-01-30 LAB — BASIC METABOLIC PANEL
Anion gap: 4 — ABNORMAL LOW (ref 5–15)
BUN: 18 mg/dL (ref 8–23)
CO2: 26 mmol/L (ref 22–32)
Calcium: 8.2 mg/dL — ABNORMAL LOW (ref 8.9–10.3)
Chloride: 108 mmol/L (ref 98–111)
Creatinine, Ser: 0.9 mg/dL (ref 0.61–1.24)
GFR, Estimated: >60 mL/min (ref >60)
Glucose, Bld: 129 mg/dL — ABNORMAL HIGH (ref 70–99)
Potassium: 3.7 mmol/L (ref 3.5–5.1)
Sodium: 138 mmol/L (ref 135–145)

## 2021-01-30 LAB — CBC WITH DIFFERENTIAL/PLATELET
Abs Immature Granulocytes: 0.05 10*3/uL (ref 0.00–0.07)
Basophils Absolute: 0 10*3/uL (ref 0.0–0.1)
Basophils Relative: 1 %
Eosinophils Absolute: 0.2 10*3/uL (ref 0.0–0.5)
Eosinophils Relative: 2 %
HCT: 49.3 % (ref 39.0–52.0)
Hemoglobin: 16 g/dL (ref 13.0–17.0)
Immature Granulocytes: 1 %
Lymphocytes Relative: 20 %
Lymphs Abs: 1.7 10*3/uL (ref 0.7–4.0)
MCH: 31.3 pg (ref 26.0–34.0)
MCHC: 32.5 g/dL (ref 30.0–36.0)
MCV: 96.5 fL (ref 80.0–100.0)
Monocytes Absolute: 0.5 10*3/uL (ref 0.1–1.0)
Monocytes Relative: 6 %
Neutro Abs: 5.8 10*3/uL (ref 1.7–7.7)
Neutrophils Relative %: 70 %
Platelets: 135 10*3/uL — ABNORMAL LOW (ref 150–400)
RBC: 5.11 MIL/uL (ref 4.22–5.81)
RDW: 14.3 % (ref 11.5–15.5)
WBC: 8.2 10*3/uL (ref 4.0–10.5)
nRBC: 0 % (ref 0.0–0.2)

## 2021-01-30 LAB — RESP PANEL BY RT-PCR (FLU A&B, COVID) ARPGX2
Influenza A by PCR: NEGATIVE
Influenza B by PCR: NEGATIVE
SARS Coronavirus 2 by RT PCR: NEGATIVE

## 2021-01-30 MED ORDER — IPRATROPIUM-ALBUTEROL 0.5-2.5 (3) MG/3ML IN SOLN
3.0000 mL | Freq: Once | RESPIRATORY_TRACT | Status: AC
  Administered 2021-01-30: 16:00:00 3 mL via RESPIRATORY_TRACT
  Filled 2021-01-30: qty 3

## 2021-01-30 MED ORDER — ALBUTEROL SULFATE HFA 108 (90 BASE) MCG/ACT IN AERS: 1.0000 | INHALATION_SPRAY | Freq: Four times a day (QID) | RESPIRATORY_TRACT | 0 refills | Status: DC | PRN

## 2021-01-30 MED ORDER — BENZONATATE 100 MG PO CAPS: 100.0000 mg | ORAL_CAPSULE | Freq: Three times a day (TID) | ORAL | 0 refills | Status: DC

## 2021-01-30 NOTE — Discharge Instructions (Signed)
Your chest x-ray did not show any signs of fluid or pneumonia in your lungs.  Your labs were reassuring.  COVID and flu were negative.  I will give you Tessalon Perles for cough and albuterol inhaler for wheezing.  You can take these as needed.  I would like for you to follow-up with your primary care doctor in the next week to ensure we are going the right direction.  Please return to the emergency department sooner if you experience worsening cough, trouble breathing, chest pain, severe and worsening fever, intractable vomiting, or any other concerns you may have.

## 2021-01-30 NOTE — ED Notes (Signed)
Pulse oximetry while ambulating 98 % on room air. Patient denies sob while ambulating.

## 2021-01-30 NOTE — ED Provider Notes (Signed)
Emergency Medicine Provider Triage Evaluation Note  COBEY RAINERI , a 80 y.o. male  was evaluated in triage.  Pt complains of sob, rattle in lungs, general fatigue. Dx flu 12/4, was treated for ongoing sx with augmentin, doxy, finished abx last weekend. Seen at [facility] today for no relief of sx, xray showed pleural effusion / PNA per patient. Denies fever, chills, NV, chest pain, abdominal pain, diarrhea, headache. Not on oxygen. Had been given an inhaler for sx.  Review of Systems  Positive: Sob, cough Negative: Chest pain  Physical Exam  BP (!) 161/97 (BP Location: Left Arm)    Pulse 65    Temp 98.7 F (37.1 C) (Oral)    Resp 16    SpO2 100%  Gen:   Awake, no distress   Resp:  Normal effort  MSK:   Moves extremities without difficulty  Other:  Some crackling at bases of lungs bil, no resp. distress  Medical Decision Making  Medically screening exam initiated at 1:21 PM.  Appropriate orders placed.  STOKES RATTIGAN was informed that the remainder of the evaluation will be completed by another provider, this initial triage assessment does not replace that evaluation, and the importance of remaining in the ED until their evaluation is complete.  Post flu PNA dx at [facility]   Dorien Chihuahua 01/30/21 1323    Godfrey Pick, MD 01/30/21 [phone]

## 2021-01-30 NOTE — ED Provider Notes (Signed)
Keith [name] DEPT Provider Note   CSN: [id number] Arrival date & time: 01/30/21  1303     History Chief Complaint  Patient presents with   Shortness of Hodgkins is a 80 y.o. male with history of hyperlipidemia and hypothyroidism who presents to the emergency department with a persistent cough that has been ongoing for the last 2 to 3 weeks.  Patient was initially diagnosed with influenza at the beginning of December.  Was prescribed steroids at that time. He recovered from that but had a lingering cough and wheezing.  Was seen evaluated mother [facility] and thought to have pneumonia versus pleural effusion.  He was started on Augmentin and doxycycline and finished that a week ago.  Cough and wheezing has been persistent.  He denies any chest pain, shortness of breath, sore throat, fever, chills, nausea, vomiting, diarrhea, abdominal pain, nasal congestion, orthopnea, and PND.   Shortness of Breath     Past Medical History:  Diagnosis Date   Allergic rhinitis    Cancer ([place])    SKIN CANCER ON LIP (30 YR AGO)   DDD (degenerative disc disease), cervical    Hyperlipidemia    Hypothyroidism    PONV (postoperative nausea and vomiting)    2016 BACK SURGERY, IN RECOVERY HE HAD N/V    Patient Active Problem List   Diagnosis Date Noted   Blurred vision, bilateral 09/10/2020   Olecranon bursitis of left elbow 09/10/2020   Lumbar radiculopathy, right 09/18/2019   AC (acromioclavicular) arthritis 10/11/2018   Routine general medical examination at a health care facility 12/07/2016   Senile purpura ([employer]) 01/14/2015   Arthritis of sacroiliac joint 08/27/2014   Renal cyst 05/11/2014   CMC arthritis, thumb, degenerative 07/24/2013   Prostate nodule 12/15/2011   Osteoarthritis of hip 10/06/2009   Hypothyroidism 10/15/2008   Hyperlipidemia with target LDL less than 130 03/03/2008   Allergic rhinitis 03/03/2008    Past Surgical  History:  Procedure Laterality Date   AMPUTATED Right 1961   TRAUMATIC AMPUTATED FINGERS RIGHT    BACK SURGERY  2016   TONSILLECTOMY     TRANSFORAMINAL LUMBAR INTERBODY FUSION (TLIF) WITH PEDICLE SCREW FIXATION 1 LEVEL Right 11/29/2017   Procedure: RIGHT SIDED LUMBAR 4-5 TRANSFORAMINAL LUMBAR INTERBODY FUSION WITH INSTRUMENTATION AND ALLOGRAFT;  Surgeon: Phylliss Bob, MD;  Location: [place];  Service: Orthopedics;  Laterality: Right;       Family History  Problem Relation Age of Onset   Diabetes Mother    Coronary artery disease Father        Fatal MI   Diabetes Father    Coronary artery disease Other    Breast cancer Other     Social History   Tobacco Use   Smoking status: Never   Smokeless tobacco: Never  Vaping Use   Vaping Use: Never used  Substance Use Topics   Alcohol use: No   Drug use: No    Home Medications Prior to Admission medications   Medication Sig Start Date End Date Taking? Authorizing Provider  albuterol (VENTOLIN HFA) 108 (90 Base) MCG/ACT inhaler Inhale 1-2 puffs into the lungs every 6 (six) hours as needed for wheezing or shortness of breath. 01/30/21  Yes Raul Del, Zakariyah Freimark M, PA-C  benzonatate (TESSALON) 100 MG capsule Take 1 capsule (100 mg total) by mouth every 8 (eight) hours. 01/30/21  Yes Raul Del, Zionah Criswell M, PA-C  ferrous sulfate 324 MG TBEC Take 324 mg by mouth daily.  Yes [provider]  fluticasone (FLONASE) 50 MCG/ACT nasal spray Place 2 sprays into both nostrils daily. Patient taking differently: Place 1 spray into both nostrils 2 (two) times daily. 01/11/21  Yes Janith Lima, MD  levothyroxine (SYNTHROID) 112 MCG tablet TAKE 1 TABLET(112 MCG) BY MOUTH DAILY Patient taking differently: Take 112 mcg by mouth daily before breakfast. 08/26/20  Yes Janith Lima, MD  LIVALO 1 MG TABS TAKE 1 TABLET(1 MG) BY MOUTH DAILY Patient taking differently: Take 1 mg by mouth daily. 11/29/20  Yes Janith Lima, MD  magnesium oxide (MAG-OX)  400 MG tablet Take 400 mg by mouth daily.   Yes [provider]  Misc Natural Products ([employer] PROSTATE MAX + PO) Take 1 tablet by mouth 2 (two) times daily.   Yes [provider]    Allergies    Atorvastatin and Rosuvastatin  Review of Systems   Review of Systems  Respiratory:  Positive for shortness of breath.   All other systems reviewed and are negative.  Physical Exam Updated Vital Signs BP 138/84    Pulse (!) 58    Temp 98.2 F (36.8 C) (Oral)    Resp 13    Ht 5\' 10"  (1.778 m)    Wt 90.7 kg    SpO2 99%    BMI 28.70 kg/m   Physical Exam Vitals and nursing note reviewed.  Constitutional:      General: He is not in acute distress.    Appearance: Normal appearance.  HENT:     Head: Normocephalic and atraumatic.  Eyes:     General:        Right eye: No discharge.        Left eye: No discharge.  Cardiovascular:     Comments: Regular rate and rhythm.  S1/S2 are distinct without any evidence of murmur, rubs, or gallops.  Radial pulses are 2+ bilaterally.  Dorsalis pedis pulses are 2+ bilaterally.  No evidence of pedal edema. Pulmonary:     Comments: Faint expiratory wheezing.  Normal effort.  In no respiratory distress.  Coughing on exam. Abdominal:     General: Abdomen is flat. Bowel sounds are normal. There is no distension.     Tenderness: There is no abdominal tenderness. There is no guarding or rebound.  Musculoskeletal:        General: Normal range of motion.     Cervical back: Neck supple.  Skin:    General: Skin is warm and dry.     Findings: No rash.  Neurological:     General: No focal deficit present.     Mental Status: He is alert.  Psychiatric:        Mood and Affect: Mood normal.        Behavior: Behavior normal.    ED Results / Procedures / Treatments   Labs (all labs ordered are listed, but only abnormal results are displayed) Labs Reviewed  CBC WITH DIFFERENTIAL/PLATELET - Abnormal; Notable for the following components:      Result  Value   Platelets 135 (*)    All other components within normal limits  BASIC METABOLIC PANEL - Abnormal; Notable for the following components:   Glucose, Bld 129 (*)    Calcium 8.2 (*)    Anion gap 4 (*)    All other components within normal limits  RESP PANEL BY RT-PCR (FLU A&B, COVID) ARPGX2    EKG None  Radiology DG Chest 2 View  Result Date: 01/30/2021 CLINICAL  DATA:  Wheezing, cough, shortness of breath EXAM: CHEST - 2 VIEW COMPARISON:  07/24/2019 FINDINGS: Normal heart size and vascularity. Similar mid and lower lung peripheral interstitial prominence suspicious for mild interstitial lung disease worse on the right. Right hemidiaphragm is elevated. No focal pneumonia, collapse or consolidation. Negative for effusion or pneumothorax. Trachea midline. Degenerative changes of the spine and right shoulder. IMPRESSION: Stable peripheral chronic interstitial changes. No interval change or acute process by plain radiography. Electronically Signed   By: Jerilynn Mages.  Shick M.D.   On: 01/30/2021 13:42    Procedures Procedures   Medications Ordered in ED Medications  ipratropium-albuterol (DUONEB) 0.5-2.5 (3) MG/3ML nebulizer solution 3 mL (3 mLs Nebulization Given 01/30/21 1622)    ED Course  I have reviewed the triage vital signs and the nursing notes.  Pertinent labs & imaging results that were available during my care of the patient were reviewed by me and considered in my medical decision making (see chart for details).  Clinical Course as of 01/31/21 1121  Sat Jan 30, 2021  1556 I discussed this case with my attending physician who cosigned this note including patient's presenting symptoms, physical exam, and planned diagnostics and interventions. Attending physician stated agreement with plan or made changes to plan which were implemented.    [CF]  1703 Patient was 98% on RA while ambulating.  [CF]    Clinical Course User Index [CF] Cherrie Gauze   MDM  Rules/Calculators/A&P                           CHRISTPHER STOGSDILL is a 80 y.o. male who presents the emergency department with cough and wheezing.  Clinically, he appears well on my initial evaluation.  He is talking in complete sentences and in no respiratory distress.  Some faint wheezing heard on my exam.  Initial work-up was ordered in triage to include basic labs and chest x-ray.  Chest x-ray did not reveal any signs of pneumonia or pleural effusion.  Labs were normal.  We will give him a DuoNeb.  Risk class III for port pneumonia severity index.  After DuoNeb will plan to ambulate with pulse oximetry.  At this point, have a low suspicion for pneumonia, infectious etiologies, pulmonary embolism, or other emergent causes.  Patient ambulated fine was 98% on room air.  Feels improved after nebulizer treatments.  Patient does feel comfortable going home.  I will prescribe him an albuterol inhaler that he can use as needed for wheezing.  Also give him prescription for Tessalon Perles for cough.  I will have him follow-up with his primary care provider sometime next week.  Strict return precautions given.  He is safe for discharge.    Final Clinical Impression(s) / ED Diagnoses Final diagnoses:  Acute cough    Rx / DC Orders ED Discharge Orders          Ordered    albuterol (VENTOLIN HFA) 108 (90 Base) MCG/ACT inhaler  Every 6 hours PRN        01/30/21 1709    benzonatate (TESSALON) 100 MG capsule  Every 8 hours        01/30/21 1709             Myna Bright [place], [place] 01/31/21 1121    Tegeler, Gwenyth Allegra, MD 01/31/21 [phone]

## 2021-01-30 NOTE — ED Triage Notes (Signed)
Patient reports that he had the flu first week in December. Patient states he had not fully recovered and has had expiratory wheezing, cough, and SOB afterwards. Patient states he went to his PCP today and was told he had fluid on his lungs. Sats 100% in triage.

## 2021-02-16 DIAGNOSIS — R6883 Chills (without fever): Secondary | ICD-10-CM | POA: Diagnosis not present

## 2021-02-16 DIAGNOSIS — Z20822 Contact with and (suspected) exposure to covid-19: Secondary | ICD-10-CM | POA: Diagnosis not present

## 2021-03-01 ENCOUNTER — Other Ambulatory Visit: Payer: Self-pay | Admitting: Internal Medicine

## 2021-03-01 DIAGNOSIS — E039 Hypothyroidism, unspecified: Secondary | ICD-10-CM

## 2021-03-16 ENCOUNTER — Ambulatory Visit: Payer: Medicare PPO | Admitting: Internal Medicine

## 2021-03-16 ENCOUNTER — Other Ambulatory Visit: Payer: Self-pay

## 2021-03-16 ENCOUNTER — Encounter: Payer: Self-pay | Admitting: Internal Medicine

## 2021-03-16 VITALS — BP 136/86 | HR 62 | Temp 98.3°F | Resp 16 | Ht 70.0 in | Wt 196.0 lb

## 2021-03-16 DIAGNOSIS — Z0000 Encounter for general adult medical examination without abnormal findings: Secondary | ICD-10-CM

## 2021-03-16 DIAGNOSIS — E039 Hypothyroidism, unspecified: Secondary | ICD-10-CM

## 2021-03-16 DIAGNOSIS — N402 Nodular prostate without lower urinary tract symptoms: Secondary | ICD-10-CM

## 2021-03-16 DIAGNOSIS — D510 Vitamin B12 deficiency anemia due to intrinsic factor deficiency: Secondary | ICD-10-CM | POA: Diagnosis not present

## 2021-03-16 DIAGNOSIS — D696 Thrombocytopenia, unspecified: Secondary | ICD-10-CM | POA: Insufficient documentation

## 2021-03-16 DIAGNOSIS — R739 Hyperglycemia, unspecified: Secondary | ICD-10-CM | POA: Insufficient documentation

## 2021-03-16 DIAGNOSIS — E785 Hyperlipidemia, unspecified: Secondary | ICD-10-CM | POA: Diagnosis not present

## 2021-03-16 DIAGNOSIS — E538 Deficiency of other specified B group vitamins: Secondary | ICD-10-CM | POA: Insufficient documentation

## 2021-03-16 LAB — LIPID PANEL
Cholesterol: 127 mg/dL (ref 0–200)
HDL: 35.8 mg/dL — ABNORMAL LOW (ref >39.00)
LDL Cholesterol: 68 mg/dL (ref 0–99)
NonHDL: 91.1
Total CHOL/HDL Ratio: 4
Triglycerides: 116 mg/dL (ref 0.0–149.0)
VLDL: 23.2 mg/dL (ref 0.0–40.0)

## 2021-03-16 LAB — CBC WITH DIFFERENTIAL/PLATELET
Basophils Absolute: 0.1 10*3/uL (ref 0.0–0.1)
Basophils Relative: 1 % (ref 0.0–3.0)
Eosinophils Absolute: 0.2 10*3/uL (ref 0.0–0.7)
Eosinophils Relative: 2.8 % (ref 0.0–5.0)
HCT: 46.2 % (ref 39.0–52.0)
Hemoglobin: 15.1 g/dL (ref 13.0–17.0)
Lymphocytes Relative: 33.5 % (ref 12.0–46.0)
Lymphs Abs: 2.2 10*3/uL (ref 0.7–4.0)
MCHC: 32.8 g/dL (ref 30.0–36.0)
MCV: 93.5 fl (ref 78.0–100.0)
Monocytes Absolute: 0.7 10*3/uL (ref 0.1–1.0)
Monocytes Relative: 10.7 % (ref 3.0–12.0)
Neutro Abs: 3.4 10*3/uL (ref 1.4–7.7)
Neutrophils Relative %: 52 % (ref 43.0–77.0)
Platelets: 183 10*3/uL (ref 150.0–400.0)
RBC: 4.94 Mil/uL (ref 4.22–5.81)
RDW: 14 % (ref 11.5–15.5)
WBC: 6.6 10*3/uL (ref 4.0–10.5)

## 2021-03-16 LAB — BASIC METABOLIC PANEL
BUN: 16 mg/dL (ref 6–23)
CO2: 32 mEq/L (ref 19–32)
Calcium: 8.9 mg/dL (ref 8.4–10.5)
Chloride: 105 mEq/L (ref 96–112)
Creatinine, Ser: 0.99 mg/dL (ref 0.40–1.50)
GFR: 72.46 mL/min (ref >60.00)
Glucose, Bld: 84 mg/dL (ref 70–99)
Potassium: 4.5 mEq/L (ref 3.5–5.1)
Sodium: 141 mEq/L (ref 135–145)

## 2021-03-16 LAB — PSA: PSA: 3.28 ng/mL (ref 0.10–4.00)

## 2021-03-16 LAB — FOLATE: Folate: >24.2 ng/mL (ref >5.9)

## 2021-03-16 LAB — HEMOGLOBIN A1C: Hgb A1c MFr Bld: 6.1 % (ref 4.6–6.5)

## 2021-03-16 LAB — TSH: TSH: 0.91 u[IU]/mL (ref 0.35–5.50)

## 2021-03-16 LAB — VITAMIN B12: Vitamin B-12: 53 pg/mL — ABNORMAL LOW (ref 211–911)

## 2021-03-16 NOTE — Patient Instructions (Signed)

## 2021-03-16 NOTE — Progress Notes (Signed)
Subjective:  Patient ID: Keith [name], male    DOB: 1940/08/30  Age: 81 y.o. MRN: [id number]  CC: Annual Exam, Hypothyroidism, and Hyperlipidemia  This visit occurred during the SARS-CoV-2 public health emergency.  Safety protocols were in place, including screening questions prior to the visit, additional usage of staff PPE, and extensive cleaning of exam room while observing appropriate contact time as indicated for disinfecting solutions.    HPI SOMA LIZAK presents for a CPX and f/up -   He is active and denies chest pain, shortness of breath, diaphoresis, dizziness, lightheadedness, or edema.  Outpatient Medications Prior to Visit  Medication Sig Dispense Refill   albuterol (VENTOLIN HFA) 108 (90 Base) MCG/ACT inhaler Inhale 1-2 puffs into the lungs every 6 (six) hours as needed for wheezing or shortness of breath. 18 g 0   benzonatate (TESSALON) 100 MG capsule Take 1 capsule (100 mg total) by mouth every 8 (eight) hours. 21 capsule 0   ferrous sulfate 324 MG TBEC Take 324 mg by mouth daily.     fluticasone (FLONASE) 50 MCG/ACT nasal spray Place 2 sprays into both nostrils daily. (Patient taking differently: Place 1 spray into both nostrils 2 (two) times daily.) 48 g 1   levothyroxine (SYNTHROID) 112 MCG tablet Take 1 tablet (112 mcg total) by mouth daily before breakfast. 90 tablet 0   LIVALO 1 MG TABS TAKE 1 TABLET(1 MG) BY MOUTH DAILY (Patient taking differently: Take 1 mg by mouth daily.) 90 tablet 1   magnesium oxide (MAG-OX) 400 MG tablet Take 400 mg by mouth daily.     Misc Natural Products (CVS PROSTATE MAX + PO) Take 1 tablet by mouth 2 (two) times daily.     No facility-administered medications prior to visit.    ROS Review of Systems  Constitutional:  Negative for diaphoresis, fatigue and unexpected weight change.  HENT: Negative.    Eyes: Negative.   Respiratory:  Negative for cough, chest tightness, shortness of breath and wheezing.   Cardiovascular:  Negative  for chest pain, palpitations and leg swelling.  Gastrointestinal:  Negative for abdominal pain, constipation, diarrhea, nausea and vomiting.  Endocrine: Negative.   Genitourinary: Negative.  Negative for difficulty urinating.  Musculoskeletal: Negative.  Negative for arthralgias and myalgias.  Skin: Negative.   Neurological:  Negative for dizziness, weakness, light-headedness, numbness and headaches.  Hematological:  Negative for adenopathy. Does not bruise/bleed easily.  Psychiatric/Behavioral: Negative.     Objective:  BP 136/86 (BP Location: Left Arm, Patient Position: Sitting, Cuff Size: Large)    Pulse 62    Temp 98.3 F (36.8 C) (Oral)    Resp 16    Ht 5\' 10"  (1.778 m)    Wt 196 lb (88.9 kg)    SpO2 99%    BMI 28.12 kg/m   BP Readings from Last 3 Encounters:  03/16/21 136/86  01/30/21 138/84  11/09/20 (!) 147/81    Wt Readings from Last 3 Encounters:  03/16/21 196 lb (88.9 kg)  01/30/21 200 lb (90.7 kg)  09/10/20 198 lb (89.8 kg)    Physical Exam Vitals reviewed.  Constitutional:      Appearance: Normal appearance.  HENT:     Nose: Nose normal.     Mouth/Throat:     Mouth: Mucous membranes are moist.  Eyes:     General: No scleral icterus.    Conjunctiva/sclera: Conjunctivae normal.  Cardiovascular:     Rate and Rhythm: Normal rate and regular rhythm.  Heart sounds: No murmur heard. Pulmonary:     Effort: Pulmonary effort is normal.     Breath sounds: No stridor. No wheezing, rhonchi or rales.  Abdominal:     General: Abdomen is flat.     Palpations: There is no mass.     Tenderness: There is no abdominal tenderness. There is no guarding.     Hernia: No hernia is present.  Musculoskeletal:        General: Normal range of motion.     Cervical back: Neck supple.     Right lower leg: No edema.     Left lower leg: No edema.  Lymphadenopathy:     Cervical: No cervical adenopathy.  Skin:    General: Skin is warm and dry.     Coloration: Skin is not pale.   Neurological:     General: No focal deficit present.     Mental Status: He is alert and oriented to person, place, and time. Mental status is at baseline.  Psychiatric:        Mood and Affect: Mood normal.        Behavior: Behavior normal.    Lab Results  Component Value Date   WBC 6.6 03/16/2021   HGB 15.1 03/16/2021   HCT 46.2 03/16/2021   PLT 183.0 03/16/2021   GLUCOSE 84 03/16/2021   CHOL 127 03/16/2021   TRIG 116.0 03/16/2021   HDL 35.80 (L) 03/16/2021   LDLCALC 68 03/16/2021   ALT 19 08/04/2020   AST 21 08/04/2020   NA 141 03/16/2021   K 4.5 03/16/2021   CL 105 03/16/2021   CREATININE 0.99 03/16/2021   BUN 16 03/16/2021   CO2 32 03/16/2021   TSH 0.91 03/16/2021   PSA 3.28 03/16/2021   INR 1.01 11/27/2017   HGBA1C 6.1 03/16/2021    DG Chest 2 View  Result Date: 01/30/2021 CLINICAL DATA:  Wheezing, cough, shortness of breath EXAM: CHEST - 2 VIEW COMPARISON:  07/24/2019 FINDINGS: Normal heart size and vascularity. Similar mid and lower lung peripheral interstitial prominence suspicious for mild interstitial lung disease worse on the right. Right hemidiaphragm is elevated. No focal pneumonia, collapse or consolidation. Negative for effusion or pneumothorax. Trachea midline. Degenerative changes of the spine and right shoulder. IMPRESSION: Stable peripheral chronic interstitial changes. No interval change or acute process by plain radiography. Electronically Signed   By: Jerilynn Mages.  Shick M.D.   On: 01/30/2021 13:42    Assessment & Plan:   Firman was seen today for annual exam, hypothyroidism and hyperlipidemia.  Diagnoses and all orders for this visit:  Acquired hypothyroidism- His TSH is in the normal range.  He will stay on the current T4 dosage. -     TSH; Future -     TSH  Routine general medical examination at a health care facility- Exam completed, labs reviewed, vaccines reviewed and updated, cancer screenings are up-to-date, patient education was  given.  Hyperlipidemia with target LDL less than 130- LDL goal achieved. Doing well on the statin  -     Lipid panel; Future -     Lipid panel  Thrombocytopenia ([place])- His platelet count is normal now that he has vitamin B12 deficiency. -     Vitamin B12; Future -     Folate; Future -     CBC with Differential/Platelet; Future -     CBC with Differential/Platelet -     Folate -     Vitamin B12  Prostate nodule- His PSA is  not elevated. -     PSA; Future -     PSA  Chronic hyperglycemia- His A1c is at 6.2%.  Medical therapy is not yet indicated. -     Hemoglobin A1c; Future -     Basic metabolic panel; Future -     Basic metabolic panel -     Hemoglobin A1c  Vitamin B12 deficiency anemia due to intrinsic factor deficiency- I have asked him to start parenteral B12 replacement therapy.   I am having Keith [name] maintain his [place], fluticasone, magnesium oxide, ferrous sulfate, albuterol, benzonatate, Misc Natural Products (CVS PROSTATE MAX + PO), and levothyroxine.  No orders of the defined types were placed in this encounter.    Follow-up: Return in about 6 months (around 09/13/2021).  Scarlette Calico, MD

## 2021-03-19 ENCOUNTER — Encounter: Payer: Self-pay | Admitting: Internal Medicine

## 2021-03-19 MED ORDER — LEVOTHYROXINE SODIUM 112 MCG PO TABS: 112.0000 ug | ORAL_TABLET | Freq: Every day | ORAL | 1 refills | Status: DC

## 2021-03-26 ENCOUNTER — Ambulatory Visit (INDEPENDENT_AMBULATORY_CARE_PROVIDER_SITE_OTHER): Payer: Medicare PPO

## 2021-03-26 ENCOUNTER — Other Ambulatory Visit: Payer: Self-pay

## 2021-03-26 DIAGNOSIS — D510 Vitamin B12 deficiency anemia due to intrinsic factor deficiency: Secondary | ICD-10-CM | POA: Diagnosis not present

## 2021-03-26 MED ORDER — CYANOCOBALAMIN 1000 MCG/ML IJ SOLN
1000.0000 ug | Freq: Once | INTRAMUSCULAR | Status: AC
  Administered 2021-03-26: 1000 ug via INTRAMUSCULAR

## 2021-03-26 NOTE — Progress Notes (Signed)
Pt here for B12 injection per Dr.Jones  B12 103mcg given IM, and pt tolerated injection well.  Next B12 injection was not scheduled yet. Need to verify with provider how often he would like patient to come in.

## 2021-04-05 ENCOUNTER — Telehealth: Payer: Self-pay

## 2021-04-05 NOTE — Telephone Encounter (Signed)
Pt has been informed that PCP recommends the injections and he expressed understanding.

## 2021-04-05 NOTE — Telephone Encounter (Signed)
Pt is wanting to know if he can take Oral B-12 5000 mcg vs the injection.  Please advise [phone]

## 2021-04-07 ENCOUNTER — Other Ambulatory Visit: Payer: Self-pay

## 2021-04-07 ENCOUNTER — Telehealth: Payer: Self-pay | Admitting: Family Medicine

## 2021-04-07 DIAGNOSIS — M5416 Radiculopathy, lumbar region: Secondary | ICD-10-CM

## 2021-04-07 NOTE — Telephone Encounter (Signed)
Patient called asking if another epidural order could be sent over to [place] for him? ? ?Please advise. ?

## 2021-04-07 NOTE — Telephone Encounter (Signed)
Ordered. Patient notified. 

## 2021-04-14 ENCOUNTER — Ambulatory Visit
Admission: RE | Admit: 2021-04-14 | Discharge: 2021-04-14 | Disposition: A | Discharge status: Home / Self Care | Payer: Medicare PPO | Source: Ambulatory Visit | Attending: Family Medicine | Admitting: Family Medicine

## 2021-04-14 ENCOUNTER — Other Ambulatory Visit: Payer: Self-pay

## 2021-04-14 DIAGNOSIS — M47817 Spondylosis without myelopathy or radiculopathy, lumbosacral region: Secondary | ICD-10-CM | POA: Diagnosis not present

## 2021-04-14 DIAGNOSIS — M5416 Radiculopathy, lumbar region: Secondary | ICD-10-CM

## 2021-04-14 MED ORDER — METHYLPREDNISOLONE ACETATE 40 MG/ML INJ SUSP (RADIOLOG
80.0000 mg | Freq: Once | INTRAMUSCULAR | Status: AC
  Administered 2021-04-14: 80 mg via EPIDURAL

## 2021-04-14 MED ORDER — IOPAMIDOL (ISOVUE-M 200) INJECTION 41%
1.0000 mL | Freq: Once | INTRAMUSCULAR | Status: AC
  Administered 2021-04-14: 1 mL via EPIDURAL

## 2021-04-14 NOTE — Discharge Instructions (Signed)

## 2021-05-19 ENCOUNTER — Other Ambulatory Visit: Payer: Self-pay | Admitting: Internal Medicine

## 2021-05-19 DIAGNOSIS — E785 Hyperlipidemia, unspecified: Secondary | ICD-10-CM

## 2021-05-27 ENCOUNTER — Ambulatory Visit (INDEPENDENT_AMBULATORY_CARE_PROVIDER_SITE_OTHER): Payer: Medicare PPO

## 2021-05-27 DIAGNOSIS — E538 Deficiency of other specified B group vitamins: Secondary | ICD-10-CM | POA: Diagnosis not present

## 2021-05-27 MED ORDER — CYANOCOBALAMIN 1000 MCG/ML IJ SOLN
1000.0000 ug | Freq: Once | INTRAMUSCULAR | Status: AC
  Administered 2021-05-27: 1000 ug via INTRAMUSCULAR

## 2021-05-27 NOTE — Progress Notes (Signed)
Patient here for monthly B12 injection per Dr. Ronnald Ramp. B12 1000 mcg given IM and patient tolerated injection well today.  ?

## 2021-06-30 ENCOUNTER — Ambulatory Visit (INDEPENDENT_AMBULATORY_CARE_PROVIDER_SITE_OTHER): Payer: Medicare PPO

## 2021-06-30 DIAGNOSIS — E538 Deficiency of other specified B group vitamins: Secondary | ICD-10-CM

## 2021-06-30 MED ORDER — CYANOCOBALAMIN 1000 MCG/ML IJ SOLN
1000.0000 ug | Freq: Once | INTRAMUSCULAR | Status: AC
  Administered 2021-06-30: 1000 ug via INTRAMUSCULAR

## 2021-06-30 NOTE — Progress Notes (Signed)
Administered Vitamin B12 1021mg/ml; per PCP order; to patients right deltoid. Patient tolerated the injection well w/o reports of discomfort.

## 2021-07-01 ENCOUNTER — Ambulatory Visit (INDEPENDENT_AMBULATORY_CARE_PROVIDER_SITE_OTHER): Payer: Medicare PPO

## 2021-07-01 DIAGNOSIS — Z0000 Encounter for general adult medical examination without abnormal findings: Secondary | ICD-10-CM | POA: Diagnosis not present

## 2021-07-01 NOTE — Progress Notes (Signed)
I connected with Keith [name] today by telephone and verified that I am speaking with the correct person using two identifiers. Location patient: home Location provider: work Persons participating in the virtual visit: patient, provider.   I discussed the limitations, risks, security and privacy concerns of performing an evaluation and management service by telephone and the availability of in person appointments. I also discussed with the patient that there may be a patient responsible charge related to this service. The patient expressed understanding and verbally consented to this telephonic visit.    Interactive audio and video telecommunications were attempted between this provider and patient, however failed, due to patient having technical difficulties OR patient did not have access to video capability.  We continued and completed visit with audio only.  Some vital signs may be absent or patient reported.   Time Spent with patient on telephone encounter: 30 minutes  Subjective:   Keith [name] is a 81 y.o. male who presents for Medicare Annual/Subsequent preventive examination.  Review of Systems     Cardiac Risk Factors include: advanced age (>69mn, >>32women);dyslipidemia;family history of premature cardiovascular disease;male gender     Objective:    There were no vitals filed for this visit. There is no height or weight on file to calculate BMI.     07/01/2021    2:05 PM 01/30/2021    1:25 PM 08/04/2020   10:54 AM 06/30/2020    9:52 AM 06/06/2019    2:05 PM 11/29/2017    1:15 PM 11/27/2017    2:11 PM  Advanced Directives  Does Patient Have a Medical Advance Directive? Yes Yes No Yes Yes No No  Type of Advance Directive Living will;[place]care Power of [place]  [place] Living [place];Healthcare Power of Attorney    Does patient want to make changes to medical advance directive? No - Patient declined    No - Patient declined No - Patient declined    Copy of [place] Chart? No - copy requested   No - copy requested No - copy requested    Would patient like information on creating a medical advance directive?   No - Patient declined   No - Patient declined No - Patient declined    Current Medications (verified) Outpatient Encounter Medications as of 07/01/2021  Medication Sig   albuterol (VENTOLIN HFA) 108 (90 Base) MCG/ACT inhaler Inhale 1-2 puffs into the lungs every 6 (six) hours as needed for wheezing or shortness of breath.   ferrous sulfate 324 MG TBEC Take 324 mg by mouth daily.   fluticasone (FLONASE) 50 MCG/ACT nasal spray Place 2 sprays into both nostrils daily. (Patient taking differently: Place 1 spray into both nostrils 2 (two) times daily.)   levothyroxine (SYNTHROID) 112 MCG tablet Take 1 tablet (112 mcg total) by mouth daily before breakfast.   magnesium oxide (MAG-OX) 400 MG tablet Take 400 mg by mouth daily.   Misc Natural Products ([employer] PROSTATE MAX + PO) Take 1 tablet by mouth 2 (two) times daily.   Pitavastatin Calcium (LIVALO) 1 MG TABS Take 1 tablet (1 mg total) by mouth daily.   No facility-administered encounter medications on file as of 07/01/2021.    Allergies (verified) Atorvastatin and Rosuvastatin   History: Past Medical History:  Diagnosis Date   Allergic rhinitis    [place]    SKIN CANCER ON LIP (30 YR AGO)   DDD (degenerative disc disease), cervical  Hyperlipidemia    Hypothyroidism    PONV (postoperative nausea and vomiting)    2016 BACK SURGERY, IN RECOVERY HE HAD N/V   Past Surgical History:  Procedure Laterality Date   AMPUTATED Right 1961   TRAUMATIC AMPUTATED FINGERS RIGHT    BACK SURGERY  2016   TONSILLECTOMY     TRANSFORAMINAL LUMBAR INTERBODY FUSION (TLIF) WITH PEDICLE SCREW FIXATION 1 LEVEL Right 11/29/2017   Procedure: RIGHT SIDED LUMBAR 4-5 TRANSFORAMINAL LUMBAR INTERBODY FUSION WITH INSTRUMENTATION AND  ALLOGRAFT;  Surgeon: Phylliss Bob, MD;  Location: [facility];  Service: Orthopedics;  Laterality: Right;   Family History  Problem Relation Age of Onset   Diabetes Mother    Coronary artery disease Father        Fatal MI   Diabetes Father    Coronary artery disease Other    Breast cancer Other    Social History   Socioeconomic History   Marital status: Married    Spouse name: Not on file   Number of children: Not on file   Years of education: Not on file   Highest education level: Not on file  Occupational History   Occupation: Retired Nurse, children's: [employer]   Occupation: Designer, industrial/product bus  Tobacco Use   Smoking status: Never   Smokeless tobacco: Never  Scientific laboratory technician Use: Never used  Substance and Sexual Activity   Alcohol use: No   Drug use: No   Sexual activity: Not Currently  Other Topics Concern   Not on file  Social History Narrative   Regular Exercise -  YES         Social Determinants of Health   Financial Resource Strain: Low Risk    Difficulty of Paying Living Expenses: Not hard at all  Food Insecurity: No Food Insecurity   Worried About Charity fundraiser in the Last Year: Never true   [place] in the Last Year: Never true  Transportation Needs: No Transportation Needs   Lack of Transportation (Medical): No   Lack of Transportation (Non-Medical): No  Physical Activity: Sufficiently Active   Days of Exercise per Week: 5 days   Minutes of Exercise per Session: 30 min  Stress: No Stress Concern Present   Feeling of Stress : Not at all  Social Connections: Socially Integrated   Frequency of Communication with Friends and Family: More than three times a week   Frequency of Social Gatherings with Friends and Family: More than three times a week   Attends Religious Services: More than 4 times per year   Active Member of [employer] or Organizations: No   Attends Music therapist: More than 4 times per year   Marital  Status: Married    Tobacco Counseling Counseling given: Not Answered   Clinical Intake:  Pre-visit preparation completed: Yes  Pain : No/denies pain     BMI - recorded: 28.12 Nutritional Status: BMI 25 -29 Overweight Nutritional Risks: None Diabetes: No  How often do you need to have someone help you when you read instructions, pamphlets, or other written materials from your doctor or pharmacy?: 1 - Never What is the last grade level you completed in school?: HSG  Diabetic? no  Interpreter Needed?: No  Information entered by :: Lisette Abu, LPN   Activities of Daily Living    07/01/2021    2:17 PM  In your present state of health, do you have any difficulty performing the  following activities:  Hearing? 0  Vision? 0  Difficulty concentrating or making decisions? 0  Walking or climbing stairs? 0  Dressing or bathing? 0  Doing errands, shopping? 0  Preparing Food and eating ? N  Using the Toilet? N  In the past six months, have you accidently leaked urine? N  Do you have problems with loss of bowel control? N  Managing your Medications? N  Managing your Finances? N  Housekeeping or managing your Housekeeping? N    Patient Care Team: Janith Lima, MD as PCP - General  Indicate any recent Medical Services you may have received from other than [facility] providers in the past year (date may be approximate).     Assessment:   This is a routine wellness examination for Brach.  Hearing/Vision screen Hearing Screening - Comments:: Patient denied any hearing difficulty.   No hearing aids.  Vision Screening - Comments:: Patient does wear corrective lenses/contacts.  Eye exam done by: [facility]   Dietary issues and exercise activities discussed: Current Exercise Habits: Home exercise routine, Type of exercise: walking, Time (Minutes): 30, Frequency (Times/Week): 5, Weekly Exercise (Minutes/Week): 150, Intensity: Mild, Exercise limited by: orthopedic  condition(s)   Goals Addressed             This Visit's Progress    My goal is to keep taking care of my wife.        Depression Screen    07/01/2021    2:10 PM 06/30/2020   10:05 AM 06/06/2019    2:05 PM 11/05/2018    4:08 PM 01/01/2018    3:20 PM 12/06/2016    4:20 PM 11/04/2014   10:12 AM  PHQ 2/9 Scores  PHQ - 2 Score 0 0 0 0 0 4 0  PHQ- 9 Score      7     Fall Risk    07/01/2021    2:07 PM 06/30/2020   10:06 AM 06/06/2019    2:05 PM 11/05/2018    4:08 PM 01/01/2018    3:20 PM  Fall Risk   Falls in the past year? 1 0 0 0 0  Number falls in past yr: 0 0 0 0 0  Injury with Fall? 0 0 0 0 0  Risk for fall due to : Orthopedic patient No Fall Risks No Fall Risks    Follow up Falls evaluation completed Falls evaluation completed Falls evaluation completed;Education provided;Falls prevention discussed Falls evaluation completed Falls evaluation completed    FALL RISK PREVENTION PERTAINING TO THE HOME:  Any stairs in or around the home? No  If so, are there any without handrails? No  Home free of loose throw rugs in walkways, pet beds, electrical cords, etc? Yes  Adequate lighting in your home to reduce risk of falls? Yes   ASSISTIVE DEVICES UTILIZED TO PREVENT FALLS:  Life alert? No  Use of a cane, walker or w/c? No  Grab bars in the bathroom? No  Shower chair or bench in shower? Yes  Elevated toilet seat or a handicapped toilet? Yes   TIMED UP AND GO:  Was the test performed? No .  Length of time to ambulate 10 feet: n/a sec.   Appearance of gait: Patient not evaluated for gait during this visit.  Cognitive Function:        07/01/2021    2:18 PM  6CIT Screen  What Year? 0 points  What month? 0 points  What time? 0 points  Count back from 20  0 points  Months in reverse 0 points  Repeat phrase 0 points  Total Score 0 points    Immunizations Immunization History  Administered Date(s) Administered   Fluad Quad(high Dose 65+) 11/05/2018   Influenza  Split 12/29/2010, 11/09/2011, 11/21/2013   Influenza Whole 10/06/2009   Influenza, High Dose Seasonal PF 11/07/2016, 01/01/2018   Influenza,inj,Quad PF,6+ Mos 10/23/2012, 11/04/2014, 10/01/2015   Moderna SARS-COV2 Booster Vaccination 01/12/2020   Moderna Sars-Covid-2 Vaccination 03/25/2019, 04/23/2019   Pneumococcal Conjugate-13 03/07/2014   Pneumococcal Polysaccharide-23 10/23/2012   Tdap 12/29/2010    TDAP status: Due, Education has been provided regarding the importance of this vaccine. Advised may receive this vaccine at local pharmacy or Health Dept. Aware to provide a copy of the vaccination record if obtained from local pharmacy or Health Dept. Verbalized acceptance and understanding.  Flu Vaccine status: Up to date  Pneumococcal vaccine status: Up to date  Covid-19 vaccine status: Completed vaccines  Qualifies for Shingles Vaccine? Yes   Zostavax completed No   Shingrix Completed?: No.    Education has been provided regarding the importance of this vaccine. Patient has been advised to call insurance company to determine out of pocket expense if they have not yet received this vaccine. Advised may also receive vaccine at local pharmacy or Health Dept. Verbalized acceptance and understanding.  Screening Tests Health Maintenance  Topic Date Due   Hepatitis C Screening  Never done   Zoster Vaccines- Shingrix (1 of 2) Never done   COVID-19 Vaccine (3 - Booster for Moderna series) 03/08/2020   TETANUS/TDAP  12/28/2020   INFLUENZA VACCINE  09/07/2021   Pneumonia Vaccine 42+ Years old  Completed   HPV VACCINES  Aged Out    Health Maintenance  Health Maintenance Due  Topic Date Due   Hepatitis C Screening  Never done   Zoster Vaccines- Shingrix (1 of 2) Never done   COVID-19 Vaccine (3 - Booster for Moderna series) 03/08/2020   TETANUS/TDAP  12/28/2020    Colorectal cancer screening: Type of screening: Cologuard. Completed 05/01/2020. Repeat every 3 years  Lung Cancer  Screening: (Low Dose CT Chest recommended if Age 17-80 years, 30 pack-year currently smoking OR have quit w/in 15years.) does not qualify.   Lung Cancer Screening Referral: no  Additional Screening:  Hepatitis C Screening: does qualify; Completed no  Vision Screening: Recommended annual ophthalmology exams for early detection of glaucoma and other disorders of the eye. Is the patient up to date with their annual eye exam?  Yes  Who is the provider or what is the name of the office in which the patient attends annual eye exams? [facility] If pt is not established with a provider, would they like to be referred to a provider to establish care? No .   Dental Screening: Recommended annual dental exams for proper oral hygiene  Community Resource Referral / Chronic Care Management: CRR required this visit?  No   CCM required this visit?  No      Plan:     I have personally reviewed and noted the following in the patient's chart:   Medical and social history Use of alcohol, tobacco or illicit drugs  Current medications and supplements including opioid prescriptions. Patient is not currently taking opioid prescriptions. Functional ability and status Nutritional status Physical activity Advanced directives List of other physicians Hospitalizations, surgeries, and ER visits in previous 12 months Vitals Screenings to include cognitive, depression, and falls Referrals and appointments  In addition, I have reviewed and discussed  with patient certain preventive protocols, quality metrics, and best practice recommendations. A written personalized care plan for preventive services as well as general preventive health recommendations were provided to patient.     Sheral Flow, LPN   [id number]/[id number]   Nurse Notes:  There were no vitals filed for this visit. There is no height or weight on file to calculate BMI. Patient stated that he has no issues with gait or balance; does not use  any assistive devices.

## 2021-07-01 NOTE — Patient Instructions (Signed)
Keith [name] , Thank you for taking time to come for your Medicare Wellness Visit. I appreciate your ongoing commitment to your health goals. Please review the following plan we discussed and let me know if I can assist you in the future.   Screening recommendations/referrals: Cologuard: 05/01/2020; due every 3 years or to age 80 Recommended yearly ophthalmology/optometry visit for glaucoma screening and checkup Recommended yearly dental visit for hygiene and checkup  Vaccinations: Influenza vaccine: due Fall 2023 Pneumococcal vaccine: 10/23/2012, 03/07/2014 Tdap vaccine: 12/29/2010; due every 10 years (overdue) Shingles vaccine: never done   Covid-19: 2/15/221, 04/23/2019, 01/12/2020, 12/15/2020  Advanced directives: Yes; Please bring a copy of your health care power of attorney and living will to the office at your convenience.  Conditions/risks identified: Yes  Next appointment: Please schedule your next Medicare Wellness Visit with your Nurse Health Advisor in 1 year by calling [phone].  Preventive Care 80 Years and Older, Male Preventive care refers to lifestyle choices and visits with your health care provider that can promote health and wellness. What does preventive care include? A yearly physical exam. This is also called an annual well check. Dental exams once or twice a year. Routine eye exams. Ask your health care provider how often you should have your eyes checked. Personal lifestyle choices, including: Daily care of your teeth and gums. Regular physical activity. Eating a healthy diet. Avoiding tobacco and drug use. Limiting alcohol use. Practicing safe sex. Taking low doses of aspirin every day. Taking vitamin and mineral supplements as recommended by your health care provider. What happens during an annual well check? The services and screenings done by your health care provider during your annual well check will depend on your age, overall health, lifestyle risk  factors, and family history of disease. Counseling  Your health care provider may ask you questions about your: Alcohol use. Tobacco use. Drug use. Emotional well-being. Home and relationship well-being. Sexual activity. Eating habits. History of falls. Memory and ability to understand (cognition). Work and work Statistician. Screening  You may have the following tests or measurements: Height, weight, and BMI. Blood pressure. Lipid and cholesterol levels. These may be checked every 5 years, or more frequently if you are over 37 years old. Skin check. Lung cancer screening. You may have this screening every year starting at age 12 if you have a 30-pack-year history of smoking and currently smoke or have quit within the past 15 years. Fecal occult blood test (FOBT) of the stool. You may have this test every year starting at age 68. Flexible sigmoidoscopy or colonoscopy. You may have a sigmoidoscopy every 5 years or a colonoscopy every 10 years starting at age 109. Prostate cancer screening. Recommendations will vary depending on your family history and other risks. Hepatitis C blood test. Hepatitis B blood test. Sexually transmitted disease (STD) testing. Diabetes screening. This is done by checking your blood sugar (glucose) after you have not eaten for a while (fasting). You may have this done every 1-3 years. Abdominal aortic aneurysm (AAA) screening. You may need this if you are a current or former smoker. Osteoporosis. You may be screened starting at age 78 if you are at high risk. Talk with your health care provider about your test results, treatment options, and if necessary, the need for more tests. Vaccines  Your health care provider may recommend certain vaccines, such as: Influenza vaccine. This is recommended every year. Tetanus, diphtheria, and acellular pertussis (Tdap, Td) vaccine. You may need a Td booster  every 10 years. Zoster vaccine. You may need this after age  66. Pneumococcal 13-valent conjugate (PCV13) vaccine. One dose is recommended after age 32. Pneumococcal polysaccharide (PPSV23) vaccine. One dose is recommended after age 54. Talk to your health care provider about which screenings and vaccines you need and how often you need them. This information is not intended to replace advice given to you by your health care provider. Make sure you discuss any questions you have with your health care provider. Document Released: 02/20/2015 Document Revised: 10/14/2015 Document Reviewed: 11/25/2014 Elsevier Interactive Patient Education  2017 [place] Prevention in the Home Falls can cause injuries. They can happen to people of all ages. There are many things you can do to make your home safe and to help prevent falls. What can I do on the outside of my home? Regularly fix the edges of walkways and driveways and fix any cracks. Remove anything that might make you trip as you walk through a door, such as a raised step or threshold. Trim any bushes or trees on the path to your home. Use bright outdoor lighting. Clear any walking paths of anything that might make someone trip, such as rocks or tools. Regularly check to see if handrails are loose or broken. Make sure that both sides of any steps have handrails. Any raised decks and porches should have guardrails on the edges. Have any leaves, snow, or ice cleared regularly. Use sand or salt on walking paths during winter. Clean up any spills in your garage right away. This includes oil or grease spills. What can I do in the bathroom? Use night lights. Install grab bars by the toilet and in the tub and shower. Do not use towel bars as grab bars. Use non-skid mats or decals in the tub or shower. If you need to sit down in the shower, use a plastic, non-slip stool. Keep the floor dry. Clean up any water that spills on the floor as soon as it happens. Remove soap buildup in the tub or shower  regularly. Attach bath mats securely with double-sided non-slip rug tape. Do not have throw rugs and other things on the floor that can make you trip. What can I do in the bedroom? Use night lights. Make sure that you have a light by your bed that is easy to reach. Do not use any sheets or blankets that are too big for your bed. They should not hang down onto the floor. Have a firm chair that has side arms. You can use this for support while you get dressed. Do not have throw rugs and other things on the floor that can make you trip. What can I do in the kitchen? Clean up any spills right away. Avoid walking on wet floors. Keep items that you use a lot in easy-to-reach places. If you need to reach something above you, use a strong step stool that has a grab bar. Keep electrical cords out of the way. Do not use floor polish or wax that makes floors slippery. If you must use wax, use non-skid floor wax. Do not have throw rugs and other things on the floor that can make you trip. What can I do with my stairs? Do not leave any items on the stairs. Make sure that there are handrails on both sides of the stairs and use them. Fix handrails that are broken or loose. Make sure that handrails are as long as the stairways. Check any carpeting to  make sure that it is firmly attached to the stairs. Fix any carpet that is loose or worn. Avoid having throw rugs at the top or bottom of the stairs. If you do have throw rugs, attach them to the floor with carpet tape. Make sure that you have a light switch at the top of the stairs and the bottom of the stairs. If you do not have them, ask someone to add them for you. What else can I do to help prevent falls? Wear shoes that: Do not have high heels. Have rubber bottoms. Are comfortable and fit you well. Are closed at the toe. Do not wear sandals. If you use a stepladder: Make sure that it is fully opened. Do not climb a closed stepladder. Make sure that  both sides of the stepladder are locked into place. Ask someone to hold it for you, if possible. Clearly mark and make sure that you can see: Any grab bars or handrails. First and last steps. Where the edge of each step is. Use tools that help you move around (mobility aids) if they are needed. These include: Canes. Walkers. Scooters. Crutches. Turn on the lights when you go into a dark area. Replace any light bulbs as soon as they burn out. Set up your furniture so you have a clear path. Avoid moving your furniture around. If any of your floors are uneven, fix them. If there are any pets around you, be aware of where they are. Review your medicines with your doctor. Some medicines can make you feel dizzy. This can increase your chance of falling. Ask your doctor what other things that you can do to help prevent falls. This information is not intended to replace advice given to you by your health care provider. Make sure you discuss any questions you have with your health care provider. Document Released: 11/20/2008 Document Revised: 07/02/2015 Document Reviewed: 02/28/2014 Elsevier Interactive Patient Education  2017 [employer].

## 2021-07-12 ENCOUNTER — Encounter: Payer: Self-pay | Admitting: Family Medicine

## 2021-07-12 ENCOUNTER — Ambulatory Visit: Payer: Medicare PPO | Admitting: Family Medicine

## 2021-07-12 VITALS — BP 132/82 | HR 60 | Temp 97.8°F | Ht 70.0 in | Wt 202.0 lb

## 2021-07-12 DIAGNOSIS — R21 Rash and other nonspecific skin eruption: Secondary | ICD-10-CM | POA: Diagnosis not present

## 2021-07-12 MED ORDER — VALACYCLOVIR HCL 1 G PO TABS: 1000.0000 mg | ORAL_TABLET | Freq: Three times a day (TID) | ORAL | 0 refills | Status: AC

## 2021-07-12 MED ORDER — TRIAMCINOLONE ACETONIDE 0.1 % EX CREA: 1.0000 "application " | TOPICAL_CREAM | Freq: Two times a day (BID) | CUTANEOUS | 0 refills | Status: DC

## 2021-07-12 NOTE — Patient Instructions (Signed)
Take the antiviral medication as prescribed and use the steroid cream for the next 5-7 days.   Follow up if the rash is getting worse or not improving over the next 7 days.

## 2021-07-12 NOTE — Progress Notes (Signed)
Subjective:     Patient ID: Keith [name], male    DOB: Dec 01, 1940, 81 y.o.   MRN: [id number]  Chief Complaint  Patient presents with   Rash    First noticed about 4-5 days ago.    HPI Patient is in today for a slightly pruritic rash on his right lower back x 3-4 days. States his skin feels differently around the rash also. He is not needing pain medication.   No fever, chills, N/V/D. No arthralgias or myalgias.   No change is soaps, detergents, lotions, etc. No new medications.   States he did get Shingrix vaccines.   Health Maintenance Due  Topic Date Due   Hepatitis C Screening  Never done   Zoster Vaccines- Shingrix (1 of 2) Never done   TETANUS/TDAP  12/28/2020    Past Medical History:  Diagnosis Date   Allergic rhinitis    Cancer ([place])    SKIN CANCER ON LIP (30 YR AGO)   DDD (degenerative disc disease), cervical    Hyperlipidemia    Hypothyroidism    PONV (postoperative nausea and vomiting)    2016 BACK SURGERY, IN RECOVERY HE HAD N/V    Past Surgical History:  Procedure Laterality Date   AMPUTATED Right 1961   TRAUMATIC AMPUTATED FINGERS RIGHT    BACK SURGERY  2016   TONSILLECTOMY     TRANSFORAMINAL LUMBAR INTERBODY FUSION (TLIF) WITH PEDICLE SCREW FIXATION 1 LEVEL Right 11/29/2017   Procedure: RIGHT SIDED LUMBAR 4-5 TRANSFORAMINAL LUMBAR INTERBODY FUSION WITH INSTRUMENTATION AND ALLOGRAFT;  Surgeon: Phylliss Bob, MD;  Location: [facility];  Service: Orthopedics;  Laterality: Right;    Family History  Problem Relation Age of Onset   Diabetes Mother    Coronary artery disease Father        Fatal MI   Diabetes Father    Coronary artery disease Other    Breast cancer Other     Social History   Socioeconomic History   Marital status: Married    Spouse name: Not on file   Number of children: Not on file   Years of education: Not on file   Highest education level: Not on file  Occupational History   Occupation: Retired Nurse, children's:  [employer]   Occupation: Designer, industrial/product bus  Tobacco Use   Smoking status: Never   Smokeless tobacco: Never  Scientific laboratory technician Use: Never used  Substance and Sexual Activity   Alcohol use: No   Drug use: No   Sexual activity: Not Currently  Other Topics Concern   Not on file  Social History Narrative   Regular Exercise -  YES         Social Determinants of Health   Financial Resource Strain: Low Risk    Difficulty of Paying Living Expenses: Not hard at all  Food Insecurity: No Food Insecurity   Worried About Charity fundraiser in the Last Year: Never true   [place] in the Last Year: Never true  Transportation Needs: No Transportation Needs   Lack of Transportation (Medical): No   Lack of Transportation (Non-Medical): No  Physical Activity: Sufficiently Active   Days of Exercise per Week: 5 days   Minutes of Exercise per Session: 30 min  Stress: No Stress Concern Present   Feeling of Stress : Not at all  Social Connections: Socially Integrated   Frequency of Communication with Friends and Family: More than three times a  week   Frequency of Social Gatherings with Friends and Family: More than three times a week   Attends Religious Services: More than 4 times per year   Active Member of Clubs or Organizations: No   Attends Music therapist: More than 4 times per year   Marital Status: Married  Human resources officer Violence: Not At Risk   Fear of Current or Ex-Partner: No   Emotionally Abused: No   Physically Abused: No   Sexually Abused: No    Outpatient Medications Prior to Visit  Medication Sig Dispense Refill   albuterol (VENTOLIN HFA) 108 (90 Base) MCG/ACT inhaler Inhale 1-2 puffs into the lungs every 6 (six) hours as needed for wheezing or shortness of breath. 18 g 0   ferrous sulfate 324 MG TBEC Take 324 mg by mouth daily.     fluticasone (FLONASE) 50 MCG/ACT nasal spray Place 2 sprays into both nostrils daily. (Patient taking  differently: Place 1 spray into both nostrils 2 (two) times daily.) 48 g 1   levothyroxine (SYNTHROID) 112 MCG tablet Take 1 tablet (112 mcg total) by mouth daily before breakfast. 90 tablet 1   magnesium oxide (MAG-OX) 400 MG tablet Take 400 mg by mouth daily.     Misc Natural Products ([employer] PROSTATE MAX + PO) Take 1 tablet by mouth 2 (two) times daily.     Pitavastatin Calcium (LIVALO) 1 MG TABS Take 1 tablet (1 mg total) by mouth daily. 90 tablet 1   No facility-administered medications prior to visit.    Allergies  Allergen Reactions   Atorvastatin Other (See Comments)    leg cramps   Rosuvastatin Other (See Comments)    myalgias    ROS Pertinent positives and negatives in the history of present illness.     Objective:    Physical Exam  BP 132/82 (BP Location: Left Arm, Patient Position: Sitting, Cuff Size: Normal)   Pulse 60   Temp 97.8 F (36.6 C) (Temporal)   Ht '5\' 10"'$  (1.778 m)   Wt 202 lb (91.6 kg)   SpO2 100%   BMI 28.98 kg/m  Wt Readings from Last 3 Encounters:  07/12/21 202 lb (91.6 kg)  03/16/21 196 lb (88.9 kg)  01/30/21 200 lb (90.7 kg)      Clusters of erythematous papules and some scabbing over and flakiness to his right lower back. Hyperesthesia to surrounding skin.     Assessment & Plan:   Problem List Items Addressed This Visit   None Visit Diagnoses     Rash of back    -  Primary   Relevant Medications   valACYclovir (VALTREX) 1000 MG tablet   triamcinolone cream (KENALOG) 0.1 %      Take the prescribed valacyclovir and use topical steroid cream. Declines pain medication. Follow up if worsening or not improving in the next 3-4 days.   I am having Keith [name] start on valACYclovir and triamcinolone cream. I am also having him maintain his fluticasone, magnesium oxide, ferrous sulfate, albuterol, Misc Natural Products ([employer]S PROSTATE MAX + PO), levothyroxine, and Livalo.  Meds ordered this encounter  Medications   valACYclovir  (VALTREX) 1000 MG tablet    Sig: Take 1 tablet (1,000 mg total) by mouth 3 (three) times daily for 7 days.    Dispense:  21 tablet    Refill:  0    Order Specific Question:   Supervising Provider    Answer:   Pricilla Holm A [[id number]]   triamcinolone cream (  KENALOG) 0.1 %    Sig: Apply 1 application. topically 2 (two) times daily.    Dispense:  30 g    Refill:  0    Order Specific Question:   Supervising Provider    Answer:   Pricilla Holm A [[id number]]

## 2021-07-15 ENCOUNTER — Encounter (HOSPITAL_COMMUNITY): Payer: Self-pay

## 2021-07-15 ENCOUNTER — Emergency Department (HOSPITAL_COMMUNITY)
Admission: EM | Admit: 2021-07-15 | Discharge: 2021-07-15 | Disposition: A | Discharge status: Home / Self Care | Payer: Medicare PPO | Attending: Emergency Medicine | Admitting: Emergency Medicine

## 2021-07-15 ENCOUNTER — Other Ambulatory Visit: Payer: Self-pay

## 2021-07-15 ENCOUNTER — Emergency Department (HOSPITAL_COMMUNITY): Payer: Medicare PPO

## 2021-07-15 DIAGNOSIS — W231XXA Caught, crushed, jammed, or pinched between stationary objects, initial encounter: Secondary | ICD-10-CM | POA: Insufficient documentation

## 2021-07-15 DIAGNOSIS — S92422B Displaced fracture of distal phalanx of left great toe, initial encounter for open fracture: Secondary | ICD-10-CM

## 2021-07-15 DIAGNOSIS — Z23 Encounter for immunization: Secondary | ICD-10-CM | POA: Diagnosis not present

## 2021-07-15 DIAGNOSIS — S99922A Unspecified injury of left foot, initial encounter: Secondary | ICD-10-CM | POA: Diagnosis present

## 2021-07-15 DIAGNOSIS — M7989 Other specified soft tissue disorders: Secondary | ICD-10-CM | POA: Diagnosis not present

## 2021-07-15 DIAGNOSIS — Y9301 Activity, walking, marching and hiking: Secondary | ICD-10-CM | POA: Diagnosis not present

## 2021-07-15 DIAGNOSIS — S92422A Displaced fracture of distal phalanx of left great toe, initial encounter for closed fracture: Secondary | ICD-10-CM | POA: Diagnosis not present

## 2021-07-15 MED ORDER — CEPHALEXIN 500 MG PO CAPS
500.0000 mg | ORAL_CAPSULE | Freq: Once | ORAL | Status: AC
  Administered 2021-07-15: 500 mg via ORAL
  Filled 2021-07-15: qty 1

## 2021-07-15 MED ORDER — CEPHALEXIN 500 MG PO CAPS: 500.0000 mg | ORAL_CAPSULE | Freq: Four times a day (QID) | ORAL | 0 refills | Status: DC

## 2021-07-15 MED ORDER — TETANUS-DIPHTH-ACELL PERTUSSIS 5-2.5-18.5 LF-MCG/0.5 IM SUSY
0.5000 mL | PREFILLED_SYRINGE | Freq: Once | INTRAMUSCULAR | Status: AC
  Administered 2021-07-15: 0.5 mL via INTRAMUSCULAR
  Filled 2021-07-15: qty 0.5

## 2021-07-15 MED ORDER — ACETAMINOPHEN 500 MG PO TABS: 500.0000 mg | ORAL_TABLET | Freq: Four times a day (QID) | ORAL | 0 refills | Status: DC | PRN

## 2021-07-15 NOTE — Discharge Instructions (Addendum)
You have been evaluated for your toe injury.  X-ray demonstrating a break in the bone on your left great toe.  Please take antibiotic as prescribed to decrease risk of infection.  Take Tylenol as needed for pain.  Use postop shoe for the next 4 to 6 weeks to help stabilize and decrease pressure upon your great toe.  You may follow-up with orthopedist as needed.  Monitor wound for any signs of infection.

## 2021-07-15 NOTE — ED Provider Triage Note (Signed)
Emergency Medicine Provider Triage Evaluation Note  Keith [name] , a 81 y.o. male  was evaluated in triage.  Pt complains of left great toe injury.  Patient states that he accidentally bent the great toe of his left foot under the foot when he got home on carpet.  He states that he feels like it is either fractured or dislocated but that he popped it back into place.  He does have bleeding around the nailbed.  Denies hitting head or loss of consciousness.  Review of Systems  Positive: Left great toe injury Negative: Loss of consciousness  Physical Exam  BP 109/78   Pulse 73   Temp 98.6 F (37 C) (Oral)   Resp 18   Ht '5\' 10"'$  (1.778 m)   Wt 91.6 kg   SpO2 98%   BMI 28.98 kg/m  Gen:   Awake, no distress   Resp:  Normal effort  MSK:   Moves extremities without difficulty  Other:    Medical Decision Making  Medically screening exam initiated at 5:13 PM.  Appropriate orders placed.  Keith [name] was informed that the remainder of the evaluation will be completed by another provider, this initial triage assessment does not replace that evaluation, and the importance of remaining in the ED until their evaluation is complete.     Dorothyann Peng, PA-C 07/15/21 1714

## 2021-07-15 NOTE — ED Triage Notes (Addendum)
Patient reports that he got his left big toe caught on the carpet and it bent under. Patient states he pulled the toe back into place. Patient has bleeding to the left nail bed.   Patient added that he has shingles on the right mid back x 1 week. Patient has been on medication x 4 days for shingles.

## 2021-07-15 NOTE — ED Notes (Signed)
An After Visit Summary was printed and given to the patient. Discharge instructions given and no further questions at this time.  

## 2021-07-15 NOTE — ED Provider Notes (Signed)
[facility] DEPT Provider Note   CSN: [id number] Arrival date & time: 07/15/21  1620     History  Chief Complaint  Patient presents with   Toe Injury    Keith [name] is a 81 y.o. male.  The history is provided by the patient. No language interpreter was used.    81 year old male presenting for evaluation of toe injury.  Patient reported his left great toe was caught in the carpet and bent under while was walking earlier today.  States he was walking barefoot.  He noticed bleeding coming from his toe he did try to reset his toe but bleeding persist.  He is not on any blood thinner medication.  He mention pain is minimal at this time.  He denies any numbness and denies any other injury.   Home Medications Prior to Admission medications   Medication Sig Start Date End Date Taking? Authorizing Provider  albuterol (VENTOLIN HFA) 108 (90 Base) MCG/ACT inhaler Inhale 1-2 puffs into the lungs every 6 (six) hours as needed for wheezing or shortness of breath. 01/30/21   Myna Bright M, PA-C  ferrous sulfate 324 MG TBEC Take 324 mg by mouth daily.    [provider]  fluticasone (FLONASE) 50 MCG/ACT nasal spray Place 2 sprays into both nostrils daily. Patient taking differently: Place 1 spray into both nostrils 2 (two) times daily. 01/11/21   Janith Lima, MD  levothyroxine (SYNTHROID) 112 MCG tablet Take 1 tablet (112 mcg total) by mouth daily before breakfast. 03/19/21   Janith Lima, MD  magnesium oxide (MAG-OX) 400 MG tablet Take 400 mg by mouth daily.    [provider]  Misc Natural Products ([employer]S PROSTATE MAX + PO) Take 1 tablet by mouth 2 (two) times daily.    [provider]  Pitavastatin Calcium (LIVALO) 1 MG TABS Take 1 tablet (1 mg total) by mouth daily. 05/19/21   Janith Lima, MD  triamcinolone cream (KENALOG) 0.1 % Apply 1 application. topically 2 (two) times daily. 07/12/21   Henson, Vickie L, NP-C  valACYclovir  (VALTREX) 1000 MG tablet Take 1 tablet (1,000 mg total) by mouth 3 (three) times daily for 7 days. 07/12/21 07/19/21  Harland Dingwall L, NP-C      Allergies    Atorvastatin and Rosuvastatin    Review of Systems   Review of Systems  All other systems reviewed and are negative.   Physical Exam Updated Vital Signs BP 109/78   Pulse 73   Temp 98.6 F (37 C) (Oral)   Resp 18   Ht '5\' 10"'$  (1.778 m)   Wt 91.6 kg   SpO2 98%   BMI 28.98 kg/m  Physical Exam Vitals and nursing note reviewed.  Constitutional:      General: He is not in acute distress.    Appearance: He is well-developed.  HENT:     Head: Atraumatic.  Eyes:     Conjunctiva/sclera: Conjunctivae normal.  Musculoskeletal:        General: Signs of injury (Left great toe: The base of toenail is shifted out of place with some active bleeding and a small subungual hematoma noted.  Area is tender to palpation without any deep laceration.  Brisk cap refill at the tip of the toe) present.     Cervical back: Neck supple.  Skin:    Findings: No rash.  Neurological:     Mental Status: He is alert.     ED Results / Procedures /  Treatments   Labs (all labs ordered are listed, but only abnormal results are displayed) Labs Reviewed - No data to display  EKG None  Radiology DG Foot Complete Left  Result Date: 07/15/2021 CLINICAL DATA:  left great toe injury EXAM: LEFT FOOT-three views COMPARISON:  May 06, 2014 FINDINGS: There is mildly displaced comminuted fracture of the distal half of the distal phalanx of the great toe seen. Soft tissue swelling. Mild degenerative changes and are more prominent at the distal IP joints of the toes. IMPRESSION: Mildly displaced comminuted fracture of the distal half of the distal phalanx of the great toe. Electronically Signed   By: Frazier Richards M.D.   On: 07/15/2021 17:47    Procedures Procedures    Medications Ordered in ED Medications  Tdap (BOOSTRIX) injection 0.5 mL (0.5 mLs  Intramuscular Given 07/15/21 1818)  cephALEXin (KEFLEX) capsule 500 mg (500 mg Oral Given 07/15/21 1818)    ED Course/ Medical Decision Making/ A&P                           Medical Decision Making Amount and/or Complexity of Data Reviewed Radiology: ordered.  Risk Prescription drug management.   BP 109/78   Pulse 73   Temp 98.6 F (37 C) (Oral)   Resp 18   Ht '5\' 10"'$  (1.778 m)   Wt 91.6 kg   SpO2 98%   BMI 28.98 kg/m   6:04 PM This is a 82 year old male who injured his left great toe when it got caught underneath a carpet and bent underneath his foot.  He reports he has to reset his toe but was concerned about the bleeding.  On exam, the base of his toenail was lifted with a small subungual hematoma noted and minimal laceration noted as well.  Laceration not amenable for suture repair.  Wound will be cleaned, will apply Xeroform dressing and will buddy tape the toe.  We will place patient in a postop shoe.  We will initiate antibiotic for this injury.  X-ray of the foot was independently viewed interpreted by me and I agree with radiologist interpretation.  X-ray demonstrate a mildly displaced comminuted fracture of the distal half of the distal phalanx of the great toe.  He is neurovascular intact.  Since patient denies having significant pain, pain medication was not given.  7:03 PM The toe was cleansed and wrapped appropriately.  Will d/c home with postop shoe, keflex and outpt f/u with ortho as needed. Care discussed with Dr. Tamera Punt.          Final Clinical Impression(s) / ED Diagnoses Final diagnoses:  Open displaced fracture of distal phalanx of left great toe, initial encounter    Rx / DC Orders ED Discharge Orders          Ordered    cephALEXin (KEFLEX) 500 MG capsule  4 times daily        07/15/21 1905    acetaminophen (TYLENOL) 500 MG tablet  Every 6 hours PRN        07/15/21 1905              Domenic Moras, PA-C 07/15/21 1907    Malvin Johns,  MD 07/15/21 1932

## 2021-07-15 NOTE — ED Triage Notes (Signed)
Vitals charted at 1624 charted on the wrong patient.

## 2021-07-19 ENCOUNTER — Ambulatory Visit: Payer: Medicare PPO | Admitting: Family Medicine

## 2021-07-19 VITALS — BP 132/84 | HR 68 | Wt 202.4 lb

## 2021-07-19 DIAGNOSIS — S92425B Nondisplaced fracture of distal phalanx of left great toe, initial encounter for open fracture: Secondary | ICD-10-CM | POA: Diagnosis not present

## 2021-07-19 MED ORDER — MUPIROCIN 2 % EX OINT: TOPICAL_OINTMENT | CUTANEOUS | 3 refills | Status: DC

## 2021-07-19 NOTE — Patient Instructions (Addendum)
Thank you for coming in today.   Check back in 10 days  Use the ointment on the toenial when changing the dressing.   Get a Steel Turf Toe insole.  Do a Producer, television/film/video for [employer]

## 2021-07-19 NOTE — Progress Notes (Signed)
   I, Peterson Lombard, LAT, ATC acting as a scribe for Lynne Leader, MD.  Subjective:    CC: L Great toe/foot pain  HPI: Pt is a 81 y/o male c/o L Great toe/foot pain. Pt was previously seen by Dr. Tamala Julian in 2021 for lumbar radiculopathy. Today, pt c/o L Great toe/foot pain that he injured on 07/15/21. Pt reports he caught his L Great toe in the carpet when walking, and got bent under his foot. Pt self-reduced the fx of his Great toe. Pt was seen at [facility] ED following the injury and XR were obtained revealing a mildly displaced comminuted fracture of the distal half of the distal phalanx of the great toe. At ED, pt's Tdap was updated, he was prescribed keflex, and placed in a post-op shoe. Pt reports not having any pain and has a hx of neuropathy.  L foot swelling: yes Treatments tried: post-op shoe, keflex,  Dx imaging: 07/15/21 L foot XR  Pertinent review of Systems: No fevers or chills  Relevant historical information: Contralateral right foot neuropathy.   Objective:    Vitals:   07/19/21 1521  BP: 132/84  Pulse: 68  SpO2: 98%   General: Well Developed, well nourished, and in no acute distress.   MSK: Left foot mild erythema at the base of the toenail.  Decreased toe motion.  Lab and Radiology Results No results found for this or any previous visit (from the past 72 hour(s)). DG Foot Complete Left  Result Date: 07/15/2021 CLINICAL DATA:  left great toe injury EXAM: LEFT FOOT-three views COMPARISON:  May 06, 2014 FINDINGS: There is mildly displaced comminuted fracture of the distal half of the distal phalanx of the great toe seen. Soft tissue swelling. Mild degenerative changes and are more prominent at the distal IP joints of the toes. IMPRESSION: Mildly displaced comminuted fracture of the distal half of the distal phalanx of the great toe. Electronically Signed   By: Frazier Richards M.D.   On: 07/15/2021 17:47    I, Lynne Leader, personally (independently) visualized  and performed the interpretation of the images attached in this note.   Impression and Recommendations:    Assessment and Plan: 81 y.o. male with fracture of the left great toe distal phalanx.  This probably is an open fracture based on a toenail laceration that probably extends to the bone.  He was treated with oral Keflex in the emergency room for 1 week which I think is reasonable.  We will provide some antibiotic ointment to apply on the base of the toenail as well.  Continue postop shoe and check back in 10 days.  At that point we will repeat x-ray and reexamine the toe.Marland Kitchen  PDMP not reviewed this encounter. No orders of the defined types were placed in this encounter.  Meds ordered this encounter  Medications   mupirocin ointment (BACTROBAN) 2 %    Sig: Apply to affected area 1-2x daily for 7 days.    Dispense:  30 g    Refill:  3    Discussed warning signs or symptoms. Please see discharge instructions. Patient expresses understanding.   The above documentation has been reviewed and is accurate and complete Lynne Leader, M.D.

## 2021-07-26 ENCOUNTER — Telehealth: Payer: Self-pay | Admitting: Internal Medicine

## 2021-07-26 NOTE — Telephone Encounter (Signed)
Pt called and stated his rash is not clearing up and he would like a refill on Triamcinolone Acetonide 0.1 %.  [id number] - [place], [address] AT [address]

## 2021-07-26 NOTE — Telephone Encounter (Signed)
Would you like to refill both these medications? Pt states rash has not cleared up.

## 2021-07-26 NOTE — Telephone Encounter (Signed)
Pt also needs a refill on valACYclovir HCl  for his rash.  [facility] #[id number] - [place], [address] AT [place]

## 2021-07-27 NOTE — Telephone Encounter (Signed)
Pt scheduled appt for tomorrow, 6/21

## 2021-07-28 ENCOUNTER — Ambulatory Visit: Payer: Medicare PPO | Admitting: Family Medicine

## 2021-07-28 ENCOUNTER — Encounter: Payer: Self-pay | Admitting: Family Medicine

## 2021-07-28 VITALS — BP 126/82 | HR 62 | Temp 97.7°F | Ht 70.0 in | Wt 203.0 lb

## 2021-07-28 DIAGNOSIS — R21 Rash and other nonspecific skin eruption: Secondary | ICD-10-CM | POA: Diagnosis not present

## 2021-07-28 NOTE — Patient Instructions (Signed)
Your skin is healing. Continue using a good lotion such as Aveeno or Eucerin.

## 2021-07-28 NOTE — Progress Notes (Signed)
Subjective:     Patient ID: Keith [name], male    DOB: 22-Oct-1940, 81 y.o.   MRN: [id number]  Chief Complaint  Patient presents with   Rash    F/u on rash from 6/5, has not gone away fully yet    HPI Patient is in today for a follow up for rash thought to be shingles. He questions if he should continue taking antiviral and using topical steroids.  Denies burning or itching. No new rash.   Health Maintenance Due  Topic Date Due   Hepatitis C Screening  Never done   Zoster Vaccines- Shingrix (1 of 2) Never done   COVID-19 Vaccine (4 - Moderna series) 04/14/2021    Past Medical History:  Diagnosis Date   Allergic rhinitis    Cancer ([place])    SKIN CANCER ON LIP (30 YR AGO)   DDD (degenerative disc disease), cervical    Hyperlipidemia    Hypothyroidism    PONV (postoperative nausea and vomiting)    2016 BACK SURGERY, IN RECOVERY HE HAD N/V    Past Surgical History:  Procedure Laterality Date   AMPUTATED Right 1961   TRAUMATIC AMPUTATED FINGERS RIGHT    BACK SURGERY  2016   TONSILLECTOMY     TRANSFORAMINAL LUMBAR INTERBODY FUSION (TLIF) WITH PEDICLE SCREW FIXATION 1 LEVEL Right 11/29/2017   Procedure: RIGHT SIDED LUMBAR 4-5 TRANSFORAMINAL LUMBAR INTERBODY FUSION WITH INSTRUMENTATION AND ALLOGRAFT;  Surgeon: Phylliss Bob, MD;  Location: [facility];  Service: Orthopedics;  Laterality: Right;    Family History  Problem Relation Age of Onset   Diabetes Mother    Coronary artery disease Father        Fatal MI   Diabetes Father    Coronary artery disease Other    Breast cancer Other     Social History   Socioeconomic History   Marital status: Married    Spouse name: Not on file   Number of children: Not on file   Years of education: Not on file   Highest education level: Not on file  Occupational History   Occupation: Retired Nurse, children's: [employer]   Occupation: Designer, industrial/product bus  Tobacco Use   Smoking status: Never   Smokeless tobacco:  Never  Scientific laboratory technician Use: Never used  Substance and Sexual Activity   Alcohol use: No   Drug use: No   Sexual activity: Not Currently  Other Topics Concern   Not on file  Social History Narrative   Regular Exercise -  YES         Social Determinants of Health   Financial Resource Strain: Low Risk  (07/01/2021)   Overall Financial Resource Strain (CARDIA)    Difficulty of Paying Living Expenses: Not hard at all  Food Insecurity: No Food Insecurity (07/01/2021)   Hunger Vital Sign    Worried About Running Out of Food in the Last Year: Never true    Dibble in the Last Year: Never true  Transportation Needs: No Transportation Needs (07/01/2021)   PRAPARE - Hydrologist (Medical): No    Lack of Transportation (Non-Medical): No  Physical Activity: Sufficiently Active (07/01/2021)   Exercise Vital Sign    Days of Exercise per Week: 5 days    Minutes of Exercise per Session: 30 min  Stress: No Stress Concern Present (07/01/2021)   [place]  Feeling of Stress : Not at all  Social Connections: Socially Integrated (07/01/2021)   Social Connection and Isolation Panel [NHANES]    Frequency of Communication with Friends and Family: More than three times a week    Frequency of Social Gatherings with Friends and Family: More than three times a week    Attends Religious Services: More than 4 times per year    Active Member of [employer] or Organizations: No    Attends Music therapist: More than 4 times per year    Marital Status: Married  Human resources officer Violence: Not At Risk (07/01/2021)   Humiliation, Afraid, Rape, and Kick questionnaire    Fear of Current or Ex-Partner: No    Emotionally Abused: No    Physically Abused: No    Sexually Abused: No    Outpatient Medications Prior to Visit  Medication Sig Dispense Refill   acetaminophen (TYLENOL) 500 MG tablet Take 1  tablet (500 mg total) by mouth every 6 (six) hours as needed. 30 tablet 0   albuterol (VENTOLIN HFA) 108 (90 Base) MCG/ACT inhaler Inhale 1-2 puffs into the lungs every 6 (six) hours as needed for wheezing or shortness of breath. 18 g 0   ferrous sulfate 324 MG TBEC Take 324 mg by mouth daily.     fluticasone (FLONASE) 50 MCG/ACT nasal spray Place 2 sprays into both nostrils daily. (Patient taking differently: Place 1 spray into both nostrils 2 (two) times daily.) 48 g 1   levothyroxine (SYNTHROID) 112 MCG tablet Take 1 tablet (112 mcg total) by mouth daily before breakfast. 90 tablet 1   magnesium oxide (MAG-OX) 400 MG tablet Take 400 mg by mouth daily.     Misc Natural Products ([employer] PROSTATE MAX + PO) Take 1 tablet by mouth 2 (two) times daily.     mupirocin ointment (BACTROBAN) 2 % Apply to affected area 1-2x daily for 7 days. 30 g 3   Pitavastatin Calcium (LIVALO) 1 MG TABS Take 1 tablet (1 mg total) by mouth daily. 90 tablet 1   cephALEXin (KEFLEX) 500 MG capsule Take 1 capsule (500 mg total) by mouth 4 (four) times daily. 20 capsule 0   triamcinolone cream (KENALOG) 0.1 % Apply 1 application. topically 2 (two) times daily. (Patient not taking: Reported on 07/28/2021) 30 g 0   No facility-administered medications prior to visit.    Allergies  Allergen Reactions   Atorvastatin Other (See Comments)    leg cramps   Rosuvastatin Other (See Comments)    myalgias    ROS     Objective:    Physical Exam  BP 126/82 (BP Location: Left Arm, Patient Position: Sitting, Cuff Size: Large)   Pulse 62   Temp 97.7 F (36.5 C) (Temporal)   Ht '5\' 10"'$  (1.778 m)   Wt 203 lb (92.1 kg)   SpO2 99%   BMI 29.13 kg/m  Wt Readings from Last 3 Encounters:  07/28/21 203 lb (92.1 kg)  07/19/21 202 lb 6.4 oz (91.8 kg)  07/15/21 202 lb (91.6 kg)       Hyperpigmentation and healing tissue from previous presumed shingles rash. No hypo or hyperesthesia.  Assessment & Plan:   Problem List Items  Addressed This Visit   None Visit Diagnoses     Rash of back    -  Primary      Discussed that his skin is healing and to continue using a good lotion such as Aveeno or Eucerin.   I have discontinued  Breslin A. Wyne's triamcinolone cream and cephALEXin. I am also having him maintain his fluticasone, magnesium oxide, ferrous sulfate, albuterol, Misc Natural Products (CVS PROSTATE MAX + PO), levothyroxine, Livalo, acetaminophen, and mupirocin ointment.  No orders of the defined types were placed in this encounter.

## 2021-08-02 ENCOUNTER — Ambulatory Visit (INDEPENDENT_AMBULATORY_CARE_PROVIDER_SITE_OTHER): Payer: Medicare PPO

## 2021-08-02 ENCOUNTER — Ambulatory Visit: Payer: Medicare PPO | Admitting: Family Medicine

## 2021-08-02 ENCOUNTER — Encounter: Payer: Self-pay | Admitting: Family Medicine

## 2021-08-02 VITALS — BP 120/68 | HR 59 | Ht 70.0 in | Wt 201.8 lb

## 2021-08-02 DIAGNOSIS — S92425B Nondisplaced fracture of distal phalanx of left great toe, initial encounter for open fracture: Secondary | ICD-10-CM | POA: Diagnosis not present

## 2021-08-02 DIAGNOSIS — S62522A Displaced fracture of distal phalanx of left thumb, initial encounter for closed fracture: Secondary | ICD-10-CM | POA: Diagnosis not present

## 2021-08-02 NOTE — Progress Notes (Signed)
   I, Philbert Riser, LAT, ATC acting as a scribe for Clementeen Graham, MD.  Keith [name] is a 81 y.o. male who presents to [facility] at [facility] today for f/u L open nondisplaced L Great toe fx. that he injured on 07/15/21. Pt reports he caught his L Great toe in the carpet when walking, and got bent under his foot. Pt self-reduced the fx of his Great toe. Pt was seen at the [facility] ED following the injury and XR were obtained revealing a mildly displaced comminuted fracture of the distal half of the distal phalanx of the great toe. Pt was last seen by Dr. Denyse Amass on 07/19/21 and was prescribe mupirocin to apply to the base of the toenail and was advised to cont wearing the post-op shoe. Today, pt reports that his L great toe is feeling much better.  He took the bandage off last Friday and applied a band-aid.  He has been wearing the post-op shoe consistently.  Dx imaging: 07/15/21 L foot XR  Pertinent review of systems: No fevers or chills  Relevant historical information: Hypothyroidism   Exam:  BP 120/68 (BP Location: Left Arm, Patient Position: Sitting, Cuff Size: Normal)   Pulse (!) 59   Ht 5\' 10"  (1.778 m)   Wt 201 lb 12.8 oz (91.5 kg)   SpO2 96%   BMI 28.96 kg/m  General: Well Developed, well nourished, and in no acute distress.   MSK: Left toe mildly tender palpation Nonerythematous.    Lab and Radiology Results  X-ray images left great toe show healing fracture at distal phalanx.  Minimal callus formation present.  Nondisplaced. X-ray images personally and independently interpreted today. Await formal radiology review.     Assessment and Plan: 81 y.o. male with left great toe fracture.  Improving with immobilization.  Plan to continue immobilization with postop shoe and recheck in 1 month.  Okay to slowly wean into normal footwear when feeling comfortable.   PDMP not reviewed this encounter. Orders Placed This Encounter  Procedures   DG Toe Great  Left    Standing Status:   Future    Number of Occurrences:   1    Standing Expiration Date:   09/01/2021    Order Specific Question:   Reason for Exam (SYMPTOM  OR DIAGNOSIS REQUIRED)    Answer:   left great toe fx    Order Specific Question:   Preferred imaging location?    Answer:   [facility]   No orders of the defined types were placed in this encounter.    Discussed warning signs or symptoms. Please see discharge instructions. Patient expresses understanding.   The above documentation has been reviewed and is accurate and complete Clementeen Graham, M.D.

## 2021-08-04 ENCOUNTER — Ambulatory Visit (INDEPENDENT_AMBULATORY_CARE_PROVIDER_SITE_OTHER): Payer: Medicare PPO

## 2021-08-04 DIAGNOSIS — E538 Deficiency of other specified B group vitamins: Secondary | ICD-10-CM

## 2021-08-04 MED ORDER — CYANOCOBALAMIN 1000 MCG/ML IJ SOLN
1000.0000 ug | Freq: Once | INTRAMUSCULAR | Status: AC
  Administered 2021-08-04: 1000 ug via INTRAMUSCULAR

## 2021-08-04 NOTE — Progress Notes (Signed)
Pt here for monthly B12 injection per Dr Ronnald Ramp.   B12 1012mg given IM right deltoid and pt tolerated injection well.   Scheduled next appointment for September 03, 2021 at 240 pm

## 2021-08-10 ENCOUNTER — Ambulatory Visit
Admission: EM | Admit: 2021-08-10 | Discharge: 2021-08-10 | Disposition: A | Discharge status: Other Acute Inpatient Hospital | Payer: Medicare PPO

## 2021-08-10 ENCOUNTER — Encounter (HOSPITAL_COMMUNITY): Payer: Self-pay

## 2021-08-10 ENCOUNTER — Ambulatory Visit (INDEPENDENT_AMBULATORY_CARE_PROVIDER_SITE_OTHER): Payer: Medicare PPO

## 2021-08-10 ENCOUNTER — Ambulatory Visit (HOSPITAL_COMMUNITY)
Admission: EM | Admit: 2021-08-10 | Discharge: 2021-08-10 | Disposition: A | Discharge status: Home / Self Care | Payer: Medicare PPO | Attending: Physician Assistant | Admitting: Physician Assistant

## 2021-08-10 DIAGNOSIS — M79642 Pain in left hand: Secondary | ICD-10-CM | POA: Diagnosis not present

## 2021-08-10 DIAGNOSIS — W19XXXA Unspecified fall, initial encounter: Secondary | ICD-10-CM

## 2021-08-10 DIAGNOSIS — S62307A Unspecified fracture of fifth metacarpal bone, left hand, initial encounter for closed fracture: Secondary | ICD-10-CM | POA: Diagnosis not present

## 2021-08-10 DIAGNOSIS — S62305A Unspecified fracture of fourth metacarpal bone, left hand, initial encounter for closed fracture: Secondary | ICD-10-CM

## 2021-08-10 DIAGNOSIS — M7989 Other specified soft tissue disorders: Secondary | ICD-10-CM | POA: Diagnosis not present

## 2021-08-10 DIAGNOSIS — Z538 Procedure and treatment not carried out for other reasons: Secondary | ICD-10-CM

## 2021-08-10 NOTE — Discharge Instructions (Addendum)
Call orthopedics in the morning for follow up  Can take Tylenol as needed for pain, elevate to reduce swelling

## 2021-08-10 NOTE — ED Provider Notes (Signed)
Patient was redirected to our urgent care by [facility] as he needs to have a ring cut off of his finger due to substantial swelling. He also needs imaging but unfortunately, we do not have the equipment to cut a ring off. Patient is agreeable and will go to our site by [place], [place] 08/10/21 1927

## 2021-08-10 NOTE — Progress Notes (Signed)
Orthopedic Tech Progress Note Patient Details:  Keith [name] 01-19-1941 [id number]  Ortho Devices Type of Ortho Device: Ulna gutter splint Ortho Device/Splint Location: lue Ortho Device/Splint Interventions: Ordered, Application, Adjustment   Post Interventions Patient Tolerated: Well  Edwina Barth 08/10/2021, 9:06 PM

## 2021-08-10 NOTE — ED Provider Notes (Signed)
[place]    CSN: [id number] Arrival date & time: 08/10/21  1946      History   Chief Complaint Chief Complaint  Patient presents with   Hand Injury    HPI Keith [name] is a 81 y.o. male.   Pt presents with bruising and swelling to his left hand after he fell yesterday.  Reports he tripped, catching himself with the left hand.  He has a gold ring on his left ring finger with significant swelling and discomfort around the ring.  No other injuries.  He presented to the UC at [facility] and was sent here to have the ring removed, no equipment to remove the ring at [facility]. Pt right handed.  He reports he fractured toes 3-4 weeks ago and is scheduled to see ortho this week.     Past Medical History:  Diagnosis Date   Allergic rhinitis    Cancer ([place])    SKIN CANCER ON LIP (30 YR AGO)   DDD (degenerative disc disease), cervical    Hyperlipidemia    Hypothyroidism    PONV (postoperative nausea and vomiting)    2016 BACK SURGERY, IN RECOVERY HE HAD N/V    Patient Active Problem List   Diagnosis Date Noted   Thrombocytopenia ([place]) 03/16/2021   Chronic hyperglycemia 03/16/2021   B12 nutritional deficiency 03/16/2021   Vitamin B12 deficiency anemia due to intrinsic factor deficiency 03/16/2021   Blurred vision, bilateral 09/10/2020   Olecranon bursitis of left elbow 09/10/2020   Lumbar radiculopathy, right 09/18/2019   AC (acromioclavicular) arthritis 10/11/2018   Routine general medical examination at a health care facility 12/07/2016   Senile purpura ([place]) 01/14/2015   Arthritis of sacroiliac joint 08/27/2014   Renal cyst 05/11/2014   CMC arthritis, thumb, degenerative 07/24/2013   Prostate nodule 12/15/2011   Osteoarthritis of hip 10/06/2009   Hypothyroidism 10/15/2008   Hyperlipidemia with target LDL less than 130 03/03/2008   Allergic rhinitis 03/03/2008    Past Surgical History:  Procedure Laterality Date   AMPUTATED Right 1961   TRAUMATIC  AMPUTATED FINGERS RIGHT    BACK SURGERY  2016   TONSILLECTOMY     TRANSFORAMINAL LUMBAR INTERBODY FUSION (TLIF) WITH PEDICLE SCREW FIXATION 1 LEVEL Right 11/29/2017   Procedure: RIGHT SIDED LUMBAR 4-5 TRANSFORAMINAL LUMBAR INTERBODY FUSION WITH INSTRUMENTATION AND ALLOGRAFT;  Surgeon: Phylliss Bob, MD;  Location: [place];  Service: Orthopedics;  Laterality: Right;       Home Medications    Prior to Admission medications   Medication Sig Start Date End Date Taking? Authorizing Provider  levothyroxine (SYNTHROID) 112 MCG tablet Take 1 tablet (112 mcg total) by mouth daily before breakfast. 03/19/21  Yes Janith Lima, MD  Pitavastatin Calcium (LIVALO) 1 MG TABS Take 1 tablet (1 mg total) by mouth daily. 05/19/21  Yes Janith Lima, MD  acetaminophen (TYLENOL) 500 MG tablet Take 1 tablet (500 mg total) by mouth every 6 (six) hours as needed. 07/15/21   Domenic Moras, PA-C  albuterol (VENTOLIN HFA) 108 (90 Base) MCG/ACT inhaler Inhale 1-2 puffs into the lungs every 6 (six) hours as needed for wheezing or shortness of breath. 01/30/21   Myna Bright M, PA-C  ferrous sulfate 324 MG TBEC Take 324 mg by mouth daily.    [provider]  fluticasone (FLONASE) 50 MCG/ACT nasal spray Place 2 sprays into both nostrils daily. Patient taking differently: Place 1 spray into both nostrils 2 (two) times daily. 01/11/21   Janith Lima,  MD  magnesium oxide (MAG-OX) 400 MG tablet Take 400 mg by mouth daily.    [provider]  Misc Natural Products (CVS PROSTATE MAX + PO) Take 1 tablet by mouth 2 (two) times daily.    [provider]  mupirocin ointment (BACTROBAN) 2 % Apply to affected area 1-2x daily for 7 days. 07/19/21   Gregor Hams, MD    Family History Family History  Problem Relation Age of Onset   Diabetes Mother    Coronary artery disease Father        Fatal MI   Diabetes Father    Coronary artery disease Other    Breast cancer Other     Social History Social  History   Tobacco Use   Smoking status: Never   Smokeless tobacco: Never  Vaping Use   Vaping Use: Never used  Substance Use Topics   Alcohol use: No   Drug use: No     Allergies   Atorvastatin and Rosuvastatin   Review of Systems Review of Systems  Constitutional:  Negative for chills and fever.  HENT:  Negative for ear pain and sore throat.   Eyes:  Negative for pain and visual disturbance.  Respiratory:  Negative for cough and shortness of breath.   Cardiovascular:  Negative for chest pain and palpitations.  Gastrointestinal:  Negative for abdominal pain and vomiting.  Genitourinary:  Negative for dysuria and hematuria.  Musculoskeletal:  Positive for arthralgias. Negative for back pain.  Skin:  Negative for color change and rash.  Neurological:  Negative for seizures and syncope.  All other systems reviewed and are negative.    Physical Exam Triage Vital Signs ED Triage Vitals  Enc Vitals Group     BP 08/10/21 1952 (!) 144/82     Pulse Rate 08/10/21 1952 77     Resp 08/10/21 1952 16     Temp 08/10/21 1952 97.7 F (36.5 C)     Temp Source 08/10/21 1952 Oral     SpO2 08/10/21 1952 95 %     Weight 08/10/21 1954 202 lb (91.6 kg)     Height 08/10/21 1954 '5\' 10"'$  (1.778 m)     Head Circumference --      Peak Flow --      Pain Score 08/10/21 1953 2     Pain Loc --      Pain Edu? --      Excl. in [place]? --    No data found.  Updated Vital Signs BP (!) 144/82 (BP Location: Left Arm)   Pulse 77   Temp 97.7 F (36.5 C) (Oral)   Resp 16   Ht '5\' 10"'$  (1.778 m)   Wt 202 lb (91.6 kg)   SpO2 95%   BMI 28.98 kg/m   Visual Acuity Right Eye Distance:   Left Eye Distance:   Bilateral Distance:    Right Eye Near:   Left Eye Near:    Bilateral Near:     Physical Exam Vitals and nursing note reviewed.  Constitutional:      General: He is not in acute distress.    Appearance: He is well-developed.  HENT:     Head: Normocephalic and atraumatic.  Eyes:      Conjunctiva/sclera: Conjunctivae normal.  Cardiovascular:     Rate and Rhythm: Normal rate and regular rhythm.     Heart sounds: No murmur heard. Pulmonary:     Effort: Pulmonary effort is normal. No respiratory distress.  Breath sounds: Normal breath sounds.  Abdominal:     Palpations: Abdomen is soft.     Tenderness: There is no abdominal tenderness.  Musculoskeletal:        General: No swelling.     Left hand: Swelling and tenderness present.     Cervical back: Neck supple.     Comments: Swelling and bruising to the left hand around 4th and 5th digit.  Significant swelling around thin gold ring on 4th digit.  Cap refill normal, neurovascularly intact.   Skin:    General: Skin is warm and dry.     Capillary Refill: Capillary refill takes less than 2 seconds.  Neurological:     Mental Status: He is alert.  Psychiatric:        Mood and Affect: Mood normal.      UC Treatments / Results  Labs (all labs ordered are listed, but only abnormal results are displayed) Labs Reviewed - No data to display  EKG   Radiology DG Hand Complete Left  Result Date: 08/10/2021 CLINICAL DATA:  Patient fell yesterday with swelling in the left hand. EXAM: LEFT HAND - COMPLETE 3+ VIEW COMPARISON:  None Available. FINDINGS: Oblique moderately displaced, mildly impacted, fracture of the shaft of the little finger metacarpal. Oblique mildly displaced, mildly impacted, fracture of the shaft of the ring finger metacarpal. No significant angulation. Overlying soft tissue swelling. Narrowing of the radiocarpal joint. 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 Moderate osteoarthritis of the middle finger MCP joint. Mild osteoarthritis of scattered IP joints. Sequela of prior trauma at the ulnar styloid. Vascular calcifications in the visualized distal left forearm and left hand. IMPRESSION: 1. Oblique moderately displaced, mildly impacted fracture of the shaft of the little finger metacarpal. No  significant angulation. 2. Oblique mildly displaced, mildly impacted fracture of the shaft of the ring finger metacarpal. No significant angulation. 3. Soft tissue swelling at the ulnar and dorsal aspect of the hand. 4. Osteoarthritis of scattered joints in the wrist and hand as described in the body of the report. Electronically Signed   By: Ileana Roup M.D.   On: 08/10/2021 20:23    Procedures Procedures (including critical care time)  Medications Ordered in UC Medications - No data to display  Initial Impression / Assessment and Plan / UC Course  I have reviewed the triage vital signs and the nursing notes.  Pertinent labs & imaging results that were available during my care of the patient were reviewed by me and considered in my medical decision making (see chart for details).     5th and 4th metacarpal fractures to left hand.  Ulnar gutter splint place by ortho tech.  Advised follow up with Hand tomorrow.  Ring cut off in clinic today.  Ring cutters here at the clinic were non functioning.  Paramedics here to transport a different patient were able to cut the ring off with their ring cutters.  Pt tolerated well.   Final Clinical Impressions(s) / UC Diagnoses   Final diagnoses:  Closed displaced fracture of fourth metacarpal bone of left hand, unspecified portion of metacarpal, initial encounter  Closed displaced fracture of fifth metacarpal bone of left hand, unspecified portion of metacarpal, initial encounter     Discharge Instructions      Call orthopedics in the morning for follow up  Can take Tylenol as needed for pain, elevate to reduce swelling      ED Prescriptions   None    PDMP not reviewed this  encounter.   Ward, Lenise Arena, PA-C 08/10/21 2032    Ward, Lenise Arena, PA-C 08/11/21 [phone]

## 2021-08-10 NOTE — ED Notes (Signed)
Ortho tech at bedside 

## 2021-08-10 NOTE — ED Notes (Signed)
Called ortho, made aware of splint ordered, ETA 20 minutes. Janett Billow PA made aware.

## 2021-08-10 NOTE — ED Triage Notes (Signed)
Patient fell yesterday, swelling in the left hand.  Ring finger swollen and needing to be cut off.

## 2021-08-11 ENCOUNTER — Ambulatory Visit (INDEPENDENT_AMBULATORY_CARE_PROVIDER_SITE_OTHER): Payer: Medicare PPO | Admitting: Sports Medicine

## 2021-08-11 ENCOUNTER — Telehealth: Payer: Self-pay | Admitting: Family Medicine

## 2021-08-11 VITALS — BP 118/79 | HR 64 | Ht 70.0 in | Wt 202.0 lb

## 2021-08-11 DIAGNOSIS — S62325A Displaced fracture of shaft of fourth metacarpal bone, left hand, initial encounter for closed fracture: Secondary | ICD-10-CM | POA: Diagnosis not present

## 2021-08-11 DIAGNOSIS — S62327A Displaced fracture of shaft of fifth metacarpal bone, left hand, initial encounter for closed fracture: Secondary | ICD-10-CM

## 2021-08-11 NOTE — Telephone Encounter (Signed)
Patient called stating that he fell on his porch on Monday and injured his left hand. He was seen at [facility] and diagnosed with a closed displaced fracture of fourth and fifth metacarpals.  He said that they wrapped it and was told to follow up with orthopedics.  He is a current patient of Dr Ocie Cornfield. Would Dr Glennon Mac be willing to see him this week (he currently has no openings) or does he think he will need orthopedic surgery based on xrays taken at Urgent Care?  Please advise.

## 2021-08-11 NOTE — Telephone Encounter (Signed)
Patient scheduled with Dr Glennon Mac today at 330.

## 2021-08-11 NOTE — Patient Instructions (Addendum)
Good to see you  Follow up in 1 week (xray first)

## 2021-08-11 NOTE — Progress Notes (Signed)
Keith [name] D.[place] Phone: [phone]   Assessment and Plan:     1. Closed displaced fracture of shaft of fifth metacarpal bone of left hand, initial encounter 2. Closed displaced fracture of shaft of fourth metacarpal bone of left hand, initial encounter - Acute, initial sports medicine visit - Closed, displaced fractures of fourth and fifth metacarpal with fifth metacarpal shaft fracture angulated <40 degrees and fourth metacarpal shaft fracture angulated at <30 degrees, neurovascularly intact, so we will proceed with attempted conservative therapy of fractures - Ulnar splint was clean and well placed, and with expected fluctuations in patient's swelling, we will leave ulnar splint on until follow-up visit   Pertinent previous records reviewed include left hand x-ray 08/10/2021, ER note 07/11/2021, Ortho tech note 08/10/2021   Follow Up: 1 week for removal of splints, repeat imaging, and placement of Evoshield if appropriate after x-ray   Subjective:   I, [name], am serving as a Education administrator for Keith [name]  Chief Complaint: Left hand pain   HPI:  08/10/2021 Keith [name] is a 81 y.o. male.    Pt presents with bruising and swelling to his left hand after he fell yesterday.  Reports he tripped, catching himself with the left hand.  He has a gold ring on his left ring finger with significant swelling and discomfort around the ring.  No other injuries.  He presented to the UC at [facility] and was sent here to have the ring removed, no equipment to remove the ring at [employer]. Pt right handed.  He reports he fractured toes 3-4 weeks ago and is scheduled to see ortho this week.   08/11/21 Patient states that he has minimal pain that only occurs if he accidentally tries to move his fourth or fifth digit.  States that he has feeling in his fourth and fifth digit.  Has not needed any medication.  Has  had no further incident or injury since ER visit yesterday  Relevant Historical Information: None pertinent  Additional pertinent review of systems negative.   Current Outpatient Medications:    acetaminophen (TYLENOL) 500 MG tablet, Take 1 tablet (500 mg total) by mouth every 6 (six) hours as needed., Disp: 30 tablet, Rfl: 0   albuterol (VENTOLIN HFA) 108 (90 Base) MCG/ACT inhaler, Inhale 1-2 puffs into the lungs every 6 (six) hours as needed for wheezing or shortness of breath., Disp: 18 g, Rfl: 0   ferrous sulfate 324 MG TBEC, Take 324 mg by mouth daily., Disp: , Rfl:    fluticasone (FLONASE) 50 MCG/ACT nasal spray, Place 2 sprays into both nostrils daily. (Patient taking differently: Place 1 spray into both nostrils 2 (two) times daily.), Disp: 48 g, Rfl: 1   levothyroxine (SYNTHROID) 112 MCG tablet, Take 1 tablet (112 mcg total) by mouth daily before breakfast., Disp: 90 tablet, Rfl: 1   magnesium oxide (MAG-OX) 400 MG tablet, Take 400 mg by mouth daily., Disp: , Rfl:    Misc Natural Products ([employer] PROSTATE MAX + PO), Take 1 tablet by mouth 2 (two) times daily., Disp: , Rfl:    mupirocin ointment (BACTROBAN) 2 %, Apply to affected area 1-2x daily for 7 days., Disp: 30 g, Rfl: 3   Pitavastatin Calcium (LIVALO) 1 MG TABS, Take 1 tablet (1 mg total) by mouth daily., Disp: 90 tablet, Rfl: 1   Objective:     Vitals:   08/11/21 1526  BP: 118/79  Pulse: 64  SpO2: 98%  Weight: 202 lb (91.6 kg)  Height: '5\' 10"'$  (1.778 m)      Body mass index is 28.98 kg/m.    Physical Exam:    General: Well-appearing, cooperative, sitting comfortably in no acute distress.  MSK: Sensation and capillary refill intact in fourth and fifth digit.  Generalized hand swelling without gross deformity Skin: Warm and dry; no focal rashes identified on limited exam. Extremities: No cyanosis   Neuro: Gross motor and sensory intact.   Psychiatric: Mood and affect are appropriate.   Neuro: CN II-XII intact;  strength and sensation intact generally, reflexes 2+ in upper and lower extremities     Electronically signed by:  Keith [name] D.[facility] 4:36 PM 08/11/21

## 2021-08-13 DIAGNOSIS — I739 Peripheral vascular disease, unspecified: Secondary | ICD-10-CM | POA: Diagnosis not present

## 2021-08-13 DIAGNOSIS — M1990 Unspecified osteoarthritis, unspecified site: Secondary | ICD-10-CM | POA: Diagnosis not present

## 2021-08-13 DIAGNOSIS — E785 Hyperlipidemia, unspecified: Secondary | ICD-10-CM | POA: Diagnosis not present

## 2021-08-13 DIAGNOSIS — N529 Male erectile dysfunction, unspecified: Secondary | ICD-10-CM | POA: Diagnosis not present

## 2021-08-13 DIAGNOSIS — Z89021 Acquired absence of right finger(s): Secondary | ICD-10-CM | POA: Diagnosis not present

## 2021-08-13 DIAGNOSIS — R32 Unspecified urinary incontinence: Secondary | ICD-10-CM | POA: Diagnosis not present

## 2021-08-13 DIAGNOSIS — E039 Hypothyroidism, unspecified: Secondary | ICD-10-CM | POA: Diagnosis not present

## 2021-08-17 NOTE — Progress Notes (Unsigned)
    [name] D.[place] Phone: [phone]   Assessment and Plan:     There are no diagnoses linked to this encounter.  ***   Pertinent previous records reviewed include ***   Follow Up: ***     Subjective:   I, Lakeishia Truluck, am serving as a Education administrator for Doctor Glennon Mac   Chief Complaint: Left hand pain    HPI:  08/10/2021 Keith [name] is a 81 y.o. male.    Pt presents with bruising and swelling to his left hand after he fell yesterday.  Reports he tripped, catching himself with the left hand.  He has a gold ring on his left ring finger with significant swelling and discomfort around the ring.  No other injuries.  He presented to the UC at [facility] and was sent here to have the ring removed, no equipment to remove the ring at [employer]. Pt right handed.  He reports he fractured toes 3-4 weeks ago and is scheduled to see ortho this week.    08/11/21 Patient states that he has minimal pain that only occurs if he accidentally tries to move his fourth or fifth digit.  States that he has feeling in his fourth and fifth digit.  Has not needed any medication.  Has had no further incident or injury since ER visit yesterday  08/18/2021 Patient states   Additional pertinent review of systems negative.   Current Outpatient Medications:    acetaminophen (TYLENOL) 500 MG tablet, Take 1 tablet (500 mg total) by mouth every 6 (six) hours as needed., Disp: 30 tablet, Rfl: 0   albuterol (VENTOLIN HFA) 108 (90 Base) MCG/ACT inhaler, Inhale 1-2 puffs into the lungs every 6 (six) hours as needed for wheezing or shortness of breath., Disp: 18 g, Rfl: 0   ferrous sulfate 324 MG TBEC, Take 324 mg by mouth daily., Disp: , Rfl:    fluticasone (FLONASE) 50 MCG/ACT nasal spray, Place 2 sprays into both nostrils daily. (Patient taking differently: Place 1 spray into both nostrils 2 (two) times daily.), Disp: 48 g, Rfl:  1   levothyroxine (SYNTHROID) 112 MCG tablet, Take 1 tablet (112 mcg total) by mouth daily before breakfast., Disp: 90 tablet, Rfl: 1   magnesium oxide (MAG-OX) 400 MG tablet, Take 400 mg by mouth daily., Disp: , Rfl:    Misc Natural Products (CVS PROSTATE MAX + PO), Take 1 tablet by mouth 2 (two) times daily., Disp: , Rfl:    mupirocin ointment (BACTROBAN) 2 %, Apply to affected area 1-2x daily for 7 days., Disp: 30 g, Rfl: 3   Pitavastatin Calcium (LIVALO) 1 MG TABS, Take 1 tablet (1 mg total) by mouth daily., Disp: 90 tablet, Rfl: 1   Objective:     There were no vitals filed for this visit.    There is no height or weight on file to calculate BMI.    Physical Exam:    ***   Electronically signed by:  [name] D.[facility] 8:18 AM 08/17/21

## 2021-08-18 ENCOUNTER — Ambulatory Visit (INDEPENDENT_AMBULATORY_CARE_PROVIDER_SITE_OTHER): Payer: Medicare PPO

## 2021-08-18 ENCOUNTER — Ambulatory Visit: Payer: Medicare PPO | Admitting: Sports Medicine

## 2021-08-18 VITALS — BP 126/82 | HR 69 | Ht 70.0 in | Wt 199.0 lb

## 2021-08-18 DIAGNOSIS — S62327A Displaced fracture of shaft of fifth metacarpal bone, left hand, initial encounter for closed fracture: Secondary | ICD-10-CM | POA: Diagnosis not present

## 2021-08-18 DIAGNOSIS — S62325D Displaced fracture of shaft of fourth metacarpal bone, left hand, subsequent encounter for fracture with routine healing: Secondary | ICD-10-CM | POA: Diagnosis not present

## 2021-08-18 DIAGNOSIS — S62325A Displaced fracture of shaft of fourth metacarpal bone, left hand, initial encounter for closed fracture: Secondary | ICD-10-CM

## 2021-08-18 DIAGNOSIS — S62317A Displaced fracture of base of fifth metacarpal bone. left hand, initial encounter for closed fracture: Secondary | ICD-10-CM | POA: Diagnosis not present

## 2021-08-18 DIAGNOSIS — S62327D Displaced fracture of shaft of fifth metacarpal bone, left hand, subsequent encounter for fracture with routine healing: Secondary | ICD-10-CM | POA: Diagnosis not present

## 2021-08-18 NOTE — Patient Instructions (Signed)
Good to see you  4 week follow up   

## 2021-08-31 ENCOUNTER — Ambulatory Visit (INDEPENDENT_AMBULATORY_CARE_PROVIDER_SITE_OTHER): Payer: Medicare PPO

## 2021-08-31 ENCOUNTER — Ambulatory Visit (INDEPENDENT_AMBULATORY_CARE_PROVIDER_SITE_OTHER): Payer: Medicare PPO | Admitting: Family Medicine

## 2021-08-31 VITALS — BP 130/82 | HR 68 | Ht 70.0 in | Wt 200.2 lb

## 2021-08-31 DIAGNOSIS — S92425B Nondisplaced fracture of distal phalanx of left great toe, initial encounter for open fracture: Secondary | ICD-10-CM

## 2021-08-31 DIAGNOSIS — S62305A Unspecified fracture of fourth metacarpal bone, left hand, initial encounter for closed fracture: Secondary | ICD-10-CM | POA: Diagnosis not present

## 2021-08-31 DIAGNOSIS — S62327D Displaced fracture of shaft of fifth metacarpal bone, left hand, subsequent encounter for fracture with routine healing: Secondary | ICD-10-CM

## 2021-08-31 DIAGNOSIS — S62617A Displaced fracture of proximal phalanx of left little finger, initial encounter for closed fracture: Secondary | ICD-10-CM | POA: Diagnosis not present

## 2021-08-31 NOTE — Progress Notes (Unsigned)
I, Peterson Lombard, LAT, ATC acting as a scribe for Lynne Leader, MD.  Keith [name] is a 81 y.o. male who presents to [place] at [facility] today for f/u L open nondisplaced L Great toe fx. that he injured on 07/15/21. Pt reports he caught his L Great toe in the carpet when walking, and got bent under his foot. Pt self-reduced the fx of his Great toe. He was last seen by Dr. Georgina Snell on 08/02/21 and was advised to continue immobilization with postop shoe and slowly wean into normal footwear. Today, L Great toe isn't hurting but feels weakness when walking barefoot.   Pt was then seen at the [facility] on 08/10/21 after suffering a fall on 7/3 c/o L hand pain and dx w/ a 4th and 5th MC fx. Pt was placed in an ulnar gutter splint and his wedding band was cut off. Pt was seen for f/u visits by Dr. Glennon Mac on 7/5 and 7/12 and was taken out of the boxer's fx cast and placed in an Exos. Pt feels L hand is coming along and has been compliant w/ wearing Exos.   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  He cannot take time away from his caregiving needs to have surgery at this time.   Exam:  BP 130/82   Pulse 68   Ht '5\' 10"'$  (1.778 m)   Wt 200 lb 3.2 oz (90.8 kg)   SpO2 98%   BMI 28.73 kg/m  General: Well Developed, well nourished, and in no acute distress.   MSK: Left hand in EXOS cast.  Mildly tender to palpation.  Motion not tested. Left great toe.  Nontender.   Lab and Radiology Results  X-ray images left hand and left great toe obtained today personally and independently interpreted.  Left hand: Oblique fractures of the fourth and fifth metacarpal shafts with displacement of the fifth metacarpal shaft.  Alignment and displacement is not significantly  changed.  Callus formation is present.  Left great toe comminuted mildly displaced fracture of the first distal phalanx with some callus formation present.  Await formal radiology review    Assessment and Plan: 81 y.o. male with left hand fracture.  This fracture has been ongoing for about 20 days.  He has been treated with immobilization initially with ulnar gutter splint and subsequently with Exos boxers cast.  Ideally this should be surgically treated however his caregiving needs prevent his ability to have surgery at this time unless its life-threatening or absolutely necessary.  Fortunately the fracture does seem to be healing reasonably well.  Follow-up with Dr. Glennon Mac as scheduled on 14 August.  Recheck with me sooner if needed.  Left great toe fracture: Not yet healed.  Encouraged him to be very diligent about not walking barefoot.  Use turf toe insole or postop shoe consistently.  Again follow-up as above.  PDMP not reviewed this encounter. Orders Placed This Encounter  Procedures   DG Hand Complete Left    Standing Status:   Future    Number of Occurrences:   1    Standing Expiration Date:   10/01/2021    Order Specific Question:   Reason for Exam (SYMPTOM  OR DIAGNOSIS REQUIRED)  Answer:   left hand pain    Order Specific Question:   Preferred imaging location?    Answer:   [facility]   DG Toe Great Left    Standing Status:   Future    Number of Occurrences:   1    Standing Expiration Date:   10/01/2021    Order Specific Question:   Reason for Exam (SYMPTOM  OR DIAGNOSIS REQUIRED)    Answer:   left foot pain    Order Specific Question:   Preferred imaging location?    Answer:   [facility]   No orders of the defined types were placed in this encounter.    Discussed warning signs or symptoms. Please see discharge instructions. Patient expresses understanding.   The above documentation has been reviewed and is accurate and complete Lynne Leader,  M.D.

## 2021-08-31 NOTE — Progress Notes (Deleted)
   I, Peterson Lombard, LAT, ATC acting as a scribe for Lynne Leader, MD.     Pertinent review of systems: ***  Relevant historical information: ***   Exam:  There were no vitals taken for this visit. General: Well Developed, well nourished, and in no acute distress.   MSK: ***    Lab and Radiology Results No results found for this or any previous visit (from the past 72 hour(s)). No results found.     Assessment and Plan: 81 y.o. male with ***   PDMP not reviewed this encounter. No orders of the defined types were placed in this encounter.  No orders of the defined types were placed in this encounter.    Discussed warning signs or symptoms. Please see discharge instructions. Patient expresses understanding.   ***

## 2021-08-31 NOTE — Patient Instructions (Addendum)
Thank you for coming in today.   Recheck with Glennon Mac on Aug 14 as scheduled.

## 2021-09-01 ENCOUNTER — Ambulatory Visit: Payer: Medicare PPO | Admitting: Family Medicine

## 2021-09-02 NOTE — Progress Notes (Signed)
Left hand x-ray shows healing fracture as we talked about in clinic

## 2021-09-02 NOTE — Progress Notes (Signed)
Toe fracture is slowly healing

## 2021-09-03 ENCOUNTER — Ambulatory Visit (INDEPENDENT_AMBULATORY_CARE_PROVIDER_SITE_OTHER): Payer: Medicare PPO | Admitting: *Deleted

## 2021-09-03 DIAGNOSIS — E538 Deficiency of other specified B group vitamins: Secondary | ICD-10-CM | POA: Diagnosis not present

## 2021-09-03 MED ORDER — CYANOCOBALAMIN 1000 MCG/ML IJ SOLN
1000.0000 ug | Freq: Once | INTRAMUSCULAR | Status: AC
  Administered 2021-09-03: 1000 ug via INTRAMUSCULAR

## 2021-09-03 NOTE — Progress Notes (Signed)
Patient here for his b12 injection .Given in right deltoid. Patient tolerated well   Please co sign

## 2021-09-13 NOTE — Progress Notes (Signed)
Keith [name] D.[place] Phone: [phone]   Assessment and Plan:     1. Closed displaced fracture of shaft of fifth metacarpal bone of left hand with routine healing, subsequent encounter 2. Closed displaced fracture of shaft of fourth metacarpal bone of left hand with routine healing, subsequent encounter -Subacute, subsequent visit - Similar displacement with routine healing of fractures of fourth and fifth metacarpal shafts on x-ray - Patient remains neurovascularly intact with ability to flex fingers in parallel.  Patient has had mild reduction in movement of fourth and fifth fingers, so encouraged coming out of exocast twice a day for light range of motion exercises - Plan for final imaging in 3 weeks if patient still has TTP or reduction in movement.  Could consider removing cast and starting physical therapy at that time  3. Open nondisplaced fracture of distal phalanx of left great toe with routine healing, subsequent encounter  -Subacute, subsequent visit - Patient continued to have minimal discharge from base of toenail bed without active signs of infection - Patient does not recall completing an oral course of antibiotics at any time.  With no current active signs of infection, we will not prescribe antibiotics, however patient was educated on signs of infection and told to call us or go to urgent care immediately if signs should present. - Encouraged use of metal insert for toe protection  Pertinent previous records reviewed include great toe x-ray 08/02/2021 and 08/31/2021   Follow Up: - Plan for final imaging in 3 weeks if patient still has TTP or reduction in movement.  Could consider removing cast and starting physical therapy at that time   Subjective:   I, Keith [name], am serving as a Education administrator for Doctor Glennon Mac   Chief Complaint: Left hand pain    HPI:  08/10/2021 Keith [name]  is a 81 y.o. male.    Pt presents with bruising and swelling to his left hand after he fell yesterday.  Reports he tripped, catching himself with the left hand.  He has a gold ring on his left ring finger with significant swelling and discomfort around the ring.  No other injuries.  He presented to the UC at [facility] and was sent here to have the ring removed, no equipment to remove the ring at [employer]. Pt right handed.  He reports he fractured toes 3-4 weeks ago and is scheduled to see ortho this week.    08/11/21 Patient states that he has minimal pain that only occurs if he accidentally tries to move his fourth or fifth digit.  States that he has feeling in his fourth and fifth digit.  Has not needed any medication.  Has had no further incident or injury since ER visit yesterday   08/18/2021 Patient states hes doing well, still has pain where the bone is split    08/31/2021 Keith [name] is a 81 y.o. male who presents to [place] at [facility] today for f/u L open nondisplaced L Great toe fx. that he injured on 07/15/21. Pt reports he caught his L Great toe in the carpet when walking, and got bent under his foot. Pt self-reduced the fx of his Great toe. He was last seen by Dr. Georgina Snell on 08/02/21 and was advised to continue immobilization with postop shoe and slowly wean into normal footwear. Today, L Great toe isn't hurting but feels weakness when walking barefoot.  Pt was then seen at the [facility] on 08/10/21 after suffering a fall on 7/3 c/o L hand pain and dx w/ a 4th and 5th MC fx. Pt was placed in an ulnar gutter splint and his wedding band was cut off. Pt was seen for f/u visits by Dr. Glennon Mac on 7/5 and 7/12 and was taken out of the boxer's fx cast and placed in an Exos. Pt feels L hand is coming along and has been compliant w/ wearing Exos.    Dx imaging: 08/18/21 L Hand XR              08/10/21 L Hand XR             08/02/21 L Great toe XR 07/15/21 L foot  XR  09/20/2021 Patient states that he is pretty good   Additional pertinent review of systems negative.   Current Outpatient Medications:    acetaminophen (TYLENOL) 500 MG tablet, Take 1 tablet (500 mg total) by mouth every 6 (six) hours as needed., Disp: 30 tablet, Rfl: 0   albuterol (VENTOLIN HFA) 108 (90 Base) MCG/ACT inhaler, Inhale 1-2 puffs into the lungs every 6 (six) hours as needed for wheezing or shortness of breath., Disp: 18 g, Rfl: 0   ferrous sulfate 324 MG TBEC, Take 324 mg by mouth daily., Disp: , Rfl:    fluticasone (FLONASE) 50 MCG/ACT nasal spray, Place 2 sprays into both nostrils daily. (Patient taking differently: Place 1 spray into both nostrils 2 (two) times daily.), Disp: 48 g, Rfl: 1   levothyroxine (SYNTHROID) 112 MCG tablet, Take 1 tablet (112 mcg total) by mouth daily before breakfast., Disp: 90 tablet, Rfl: 1   magnesium oxide (MAG-OX) 400 MG tablet, Take 400 mg by mouth daily., Disp: , Rfl:    Misc Natural Products ([employer]S PROSTATE MAX + PO), Take 1 tablet by mouth 2 (two) times daily., Disp: , Rfl:    mupirocin ointment (BACTROBAN) 2 %, Apply to affected area 1-2x daily for 7 days., Disp: 30 g, Rfl: 3   Pitavastatin Calcium (LIVALO) 1 MG TABS, Take 1 tablet (1 mg total) by mouth daily., Disp: 90 tablet, Rfl: 1   Objective:     Vitals:   09/20/21 1450  BP: 118/82  Pulse: 63  SpO2: 98%  Weight: 199 lb (90.3 kg)  Height: '5\' 10"'$  (1.778 m)      Body mass index is 28.55 kg/m.    Physical Exam:    General: Well-appearing, cooperative, sitting comfortably in no acute distress.  MSK: Sensation and capillary refill intact in fourth and fifth digit.  Generalized hand swelling without gross deformity.  Ecchymosis resolved along medial hand, fifth digit, fourth digit.  Patient able to flex digits 2-5 in parallel with reduced flexion in 4th and 5th digit Skin: Warm and dry; no focal rashes identified on limited exam. Extremities: No cyanosis   Neuro: Gross motor  and sensory intact.   Psychiatric: Mood and affect are appropriate.   Neuro: CN II-XII intact; strength and sensation intact generally, reflexes 2+ in upper and lower extremities     Electronically signed by:  Keith [name] D.[facility] 3:40 PM 09/20/21

## 2021-09-20 ENCOUNTER — Ambulatory Visit: Payer: Medicare PPO | Admitting: Sports Medicine

## 2021-09-20 ENCOUNTER — Ambulatory Visit (INDEPENDENT_AMBULATORY_CARE_PROVIDER_SITE_OTHER): Payer: Medicare PPO

## 2021-09-20 VITALS — BP 118/82 | HR 63 | Ht 70.0 in | Wt 199.0 lb

## 2021-09-20 DIAGNOSIS — S62325D Displaced fracture of shaft of fourth metacarpal bone, left hand, subsequent encounter for fracture with routine healing: Secondary | ICD-10-CM | POA: Diagnosis not present

## 2021-09-20 DIAGNOSIS — M79642 Pain in left hand: Secondary | ICD-10-CM | POA: Diagnosis not present

## 2021-09-20 DIAGNOSIS — S62327D Displaced fracture of shaft of fifth metacarpal bone, left hand, subsequent encounter for fracture with routine healing: Secondary | ICD-10-CM

## 2021-09-20 DIAGNOSIS — S92425D Nondisplaced fracture of distal phalanx of left great toe, subsequent encounter for fracture with routine healing: Secondary | ICD-10-CM | POA: Diagnosis not present

## 2021-09-20 NOTE — Patient Instructions (Addendum)
Good to see you 3 week follow up  

## 2021-10-04 ENCOUNTER — Ambulatory Visit: Payer: Medicare PPO | Admitting: Internal Medicine

## 2021-10-04 ENCOUNTER — Ambulatory Visit (INDEPENDENT_AMBULATORY_CARE_PROVIDER_SITE_OTHER): Payer: Medicare PPO

## 2021-10-04 DIAGNOSIS — E538 Deficiency of other specified B group vitamins: Secondary | ICD-10-CM | POA: Diagnosis not present

## 2021-10-04 MED ORDER — CYANOCOBALAMIN 1000 MCG/ML IJ SOLN
1000.0000 ug | Freq: Once | INTRAMUSCULAR | Status: AC
  Administered 2021-10-04: 1000 ug via INTRAMUSCULAR

## 2021-10-04 NOTE — Progress Notes (Signed)
B12 given Please cosign 

## 2021-10-08 NOTE — Progress Notes (Unsigned)
Keith [name] D.[name] Phone: [phone]   Assessment and Plan:     There are no diagnoses linked to this encounter.  ***   Pertinent previous records reviewed include ***   Follow Up: ***     Subjective:   I, Keith [name], am serving as a Education administrator for Doctor Glennon Mac   Chief Complaint: Left hand pain    HPI:  08/10/2021 Keith [name] is a 81 y.o. male.    Pt presents with bruising and swelling to his left hand after he fell yesterday.  Reports he tripped, catching himself with the left hand.  He has a gold ring on his left ring finger with significant swelling and discomfort around the ring.  No other injuries.  He presented to the UC at [facility] and was sent here to have the ring removed, no equipment to remove the ring at [employer]. Pt right handed.  He reports he fractured toes 3-4 weeks ago and is scheduled to see ortho this week.    08/11/21 Patient states that he has minimal pain that only occurs if he accidentally tries to move his fourth or fifth digit.  States that he has feeling in his fourth and fifth digit.  Has not needed any medication.  Has had no further incident or injury since ER visit yesterday   08/18/2021 Patient states hes doing well, still has pain where the bone is split    08/31/2021 Keith [name] is a 81 y.o. male who presents to [place] at [facility] today for f/u L open nondisplaced L Great toe fx. that he injured on 07/15/21. Pt reports he caught his L Great toe in the carpet when walking, and got bent under his foot. Pt self-reduced the fx of his Great toe. He was last seen by Dr. Georgina Snell on 08/02/21 and was advised to continue immobilization with postop shoe and slowly wean into normal footwear. Today, L Great toe isn't hurting but feels weakness when walking barefoot.    Pt was then seen at the [facility] on 08/10/21 after suffering a fall on 7/3  c/o L hand pain and dx w/ a 4th and 5th MC fx. Pt was placed in an ulnar gutter splint and his wedding band was cut off. Pt was seen for f/u visits by Dr. Glennon Mac on 7/5 and 7/12 and was taken out of the boxer's fx cast and placed in an Exos. Pt feels L hand is coming along and has been compliant w/ wearing Exos.    Dx imaging: 08/18/21 L Hand XR              08/10/21 L Hand XR             08/02/21 L Great toe XR 07/15/21 L foot XR   09/20/2021 Patient states that he is pretty good   10/12/2021 Patient states   Additional pertinent review of systems negative.   Current Outpatient Medications:    acetaminophen (TYLENOL) 500 MG tablet, Take 1 tablet (500 mg total) by mouth every 6 (six) hours as needed., Disp: 30 tablet, Rfl: 0   albuterol (VENTOLIN HFA) 108 (90 Base) MCG/ACT inhaler, Inhale 1-2 puffs into the lungs every 6 (six) hours as needed for wheezing or shortness of breath., Disp: 18 g, Rfl: 0   ferrous sulfate 324 MG TBEC, Take 324 mg by mouth daily., Disp: , Rfl:    fluticasone (  FLONASE) 50 MCG/ACT nasal spray, Place 2 sprays into both nostrils daily. (Patient taking differently: Place 1 spray into both nostrils 2 (two) times daily.), Disp: 48 g, Rfl: 1   levothyroxine (SYNTHROID) 112 MCG tablet, Take 1 tablet (112 mcg total) by mouth daily before breakfast., Disp: 90 tablet, Rfl: 1   magnesium oxide (MAG-OX) 400 MG tablet, Take 400 mg by mouth daily., Disp: , Rfl:    Misc Natural Products (CVS PROSTATE MAX + PO), Take 1 tablet by mouth 2 (two) times daily., Disp: , Rfl:    mupirocin ointment (BACTROBAN) 2 %, Apply to affected area 1-2x daily for 7 days., Disp: 30 g, Rfl: 3   Pitavastatin Calcium (LIVALO) 1 MG TABS, Take 1 tablet (1 mg total) by mouth daily., Disp: 90 tablet, Rfl: 1   Objective:     There were no vitals filed for this visit.    There is no height or weight on file to calculate BMI.    Physical Exam:    ***   Electronically signed by:  Keith [name] D.[facility] 7:54 AM 10/08/21

## 2021-10-12 ENCOUNTER — Ambulatory Visit: Payer: Medicare PPO | Admitting: Sports Medicine

## 2021-10-12 ENCOUNTER — Ambulatory Visit (INDEPENDENT_AMBULATORY_CARE_PROVIDER_SITE_OTHER): Payer: Medicare PPO

## 2021-10-12 VITALS — BP 110/86 | HR 62 | Ht 70.0 in | Wt 199.0 lb

## 2021-10-12 DIAGNOSIS — S62325D Displaced fracture of shaft of fourth metacarpal bone, left hand, subsequent encounter for fracture with routine healing: Secondary | ICD-10-CM | POA: Diagnosis not present

## 2021-10-12 DIAGNOSIS — S62327D Displaced fracture of shaft of fifth metacarpal bone, left hand, subsequent encounter for fracture with routine healing: Secondary | ICD-10-CM

## 2021-10-12 DIAGNOSIS — M79642 Pain in left hand: Secondary | ICD-10-CM | POA: Diagnosis not present

## 2021-10-12 NOTE — Patient Instructions (Addendum)
Good to see you Okay to discontinue brace  PT referral  As needed follow up

## 2021-10-13 ENCOUNTER — Encounter: Payer: Self-pay | Admitting: Internal Medicine

## 2021-10-13 ENCOUNTER — Ambulatory Visit: Payer: Medicare PPO | Admitting: Internal Medicine

## 2021-10-13 VITALS — BP 130/78 | HR 100 | Temp 97.8°F | Ht 70.0 in | Wt 197.0 lb

## 2021-10-13 DIAGNOSIS — R000 Tachycardia, unspecified: Secondary | ICD-10-CM | POA: Diagnosis not present

## 2021-10-13 DIAGNOSIS — D510 Vitamin B12 deficiency anemia due to intrinsic factor deficiency: Secondary | ICD-10-CM

## 2021-10-13 DIAGNOSIS — E039 Hypothyroidism, unspecified: Secondary | ICD-10-CM | POA: Diagnosis not present

## 2021-10-13 DIAGNOSIS — Z23 Encounter for immunization: Secondary | ICD-10-CM

## 2021-10-13 DIAGNOSIS — I4891 Unspecified atrial fibrillation: Secondary | ICD-10-CM

## 2021-10-13 DIAGNOSIS — R739 Hyperglycemia, unspecified: Secondary | ICD-10-CM

## 2021-10-13 LAB — CBC WITH DIFFERENTIAL/PLATELET
Basophils Absolute: 0.1 10*3/uL (ref 0.0–0.1)
Basophils Relative: 0.9 % (ref 0.0–3.0)
Eosinophils Absolute: 0.1 10*3/uL (ref 0.0–0.7)
Eosinophils Relative: 1.3 % (ref 0.0–5.0)
HCT: 49 % (ref 39.0–52.0)
Hemoglobin: 16.3 g/dL (ref 13.0–17.0)
Lymphocytes Relative: 23.4 % (ref 12.0–46.0)
Lymphs Abs: 2 10*3/uL (ref 0.7–4.0)
MCHC: 33.3 g/dL (ref 30.0–36.0)
MCV: 93.3 fl (ref 78.0–100.0)
Monocytes Absolute: 0.5 10*3/uL (ref 0.1–1.0)
Monocytes Relative: 5.8 % (ref 3.0–12.0)
Neutro Abs: 5.9 10*3/uL (ref 1.4–7.7)
Neutrophils Relative %: 68.6 % (ref 43.0–77.0)
Platelets: 164 10*3/uL (ref 150.0–400.0)
RBC: 5.25 Mil/uL (ref 4.22–5.81)
RDW: 13.9 % (ref 11.5–15.5)
WBC: 8.7 10*3/uL (ref 4.0–10.5)

## 2021-10-13 LAB — FOLATE: Folate: >23.9 ng/mL (ref >5.9)

## 2021-10-13 LAB — TSH: TSH: 2.21 u[IU]/mL (ref 0.35–5.50)

## 2021-10-13 LAB — BASIC METABOLIC PANEL
BUN: 17 mg/dL (ref 6–23)
CO2: 26 mEq/L (ref 19–32)
Calcium: 9 mg/dL (ref 8.4–10.5)
Chloride: 105 mEq/L (ref 96–112)
Creatinine, Ser: 1.04 mg/dL (ref 0.40–1.50)
GFR: 68.03 mL/min (ref >60.00)
Glucose, Bld: 101 mg/dL — ABNORMAL HIGH (ref 70–99)
Potassium: 3.8 mEq/L (ref 3.5–5.1)
Sodium: 139 mEq/L (ref 135–145)

## 2021-10-13 MED ORDER — DILTIAZEM HCL ER COATED BEADS 120 MG PO CP24: 120.0000 mg | ORAL_CAPSULE | Freq: Every day | ORAL | 0 refills | Status: DC

## 2021-10-13 NOTE — Patient Instructions (Signed)
Atrial Fibrillation  Atrial fibrillation is a type of irregular or rapid heartbeat (arrhythmia). In atrial fibrillation, the top part of the heart (atria) beats in an irregular pattern. This makes the heart unable to pump blood normally and effectively. The goal of treatment is to prevent blood clots from forming, control your heart rate, or restore your heartbeat to a normal rhythm. If this condition is not treated, it can cause serious problems, such as a weakened heart muscle (cardiomyopathy) or a stroke. What are the causes? This condition is often caused by medical conditions that damage the heart's electrical system. These include: High blood pressure (hypertension). This is the most common cause. Certain heart problems or conditions, such as heart failure, coronary artery disease, heart valve problems, or heart surgery. Diabetes. Overactive thyroid (hyperthyroidism). Obesity. Chronic kidney disease. In some cases, the cause of this condition is not known. What increases the risk? This condition is more likely to develop in: Older people. People who smoke. Athletes who do endurance exercise. People who have a family history of atrial fibrillation. Men. People who use drugs. People who drink a lot of alcohol. People who have lung conditions, such as emphysema, pneumonia, or COPD. People who have obstructive sleep apnea. What are the signs or symptoms? Symptoms of this condition include: A feeling that your heart is racing or beating irregularly. Discomfort or pain in your chest. Shortness of breath. Sudden light-headedness or weakness. Tiring easily during exercise or activity. Fatigue. Syncope (fainting). Sweating. In some cases, there are no symptoms. How is this diagnosed? Your health care provider may detect atrial fibrillation when taking your pulse. If detected, this condition may be diagnosed with: An electrocardiogram (ECG) to check electrical signals of the  heart. An ambulatory cardiac monitor to record your heart's activity for a few days. A transthoracic echocardiogram (TTE) to create pictures of your heart. A transesophageal echocardiogram (TEE) to create even closer pictures of your heart. A stress test to check your blood supply while you exercise. Imaging tests, such as a CT scan or chest X-ray. Blood tests. How is this treated? Treatment depends on underlying conditions and how you feel when you experience atrial fibrillation. This condition may be treated with: Medicines to prevent blood clots or to treat heart rate or heart rhythm problems. Electrical cardioversion to reset the heart's rhythm. A pacemaker to correct abnormal heart rhythm. Ablation to remove the heart tissue that sends abnormal signals. Left atrial appendage closure to seal the area where blood clots can form. In some cases, underlying conditions will be treated. Follow these instructions at home: Medicines Take over-the counter and prescription medicines only as told by your health care provider. Do not take any new medicines without talking to your health care provider. If you are taking blood thinners: Talk with your health care provider before you take any medicines that contain aspirin or NSAIDs, such as ibuprofen. These medicines increase your risk for dangerous bleeding. Take your medicine exactly as told, at the same time every day. Avoid activities that could cause injury or bruising, and follow instructions about how to prevent falls. Wear a medical alert bracelet or carry a card that lists what medicines you take. Lifestyle     Do not use any products that contain nicotine or tobacco, such as cigarettes, e-cigarettes, and chewing tobacco. If you need help quitting, ask your health care provider. Eat heart-healthy foods. Talk with a dietitian to make an eating plan that is right for you. Exercise regularly as told   by your health care provider. Do not  drink alcohol. Lose weight if you are overweight. Do not use drugs, including cannabis. General instructions If you have obstructive sleep apnea, manage your condition as told by your health care provider. Do not use diet pills unless your health care provider approves. Diet pills can make heart problems worse. Keep all follow-up visits as told by your health care provider. This is important. Contact a health care provider if you: Notice a change in the rate, rhythm, or strength of your heartbeat. Are taking a blood thinner and you notice more bruising. Tire more easily when you exercise or do heavy work. Have a sudden change in weight. Get help right away if you have:  Chest pain, abdominal pain, sweating, or weakness. Trouble breathing. Side effects of blood thinners, such as blood in your vomit, stool, or urine, or bleeding that cannot stop. Any symptoms of a stroke. "BE FAST" is an easy way to remember the main warning signs of a stroke: B - Balance. Signs are dizziness, sudden trouble walking, or loss of balance. E - Eyes. Signs are trouble seeing or a sudden change in vision. F - Face. Signs are sudden weakness or numbness of the face, or the face or eyelid drooping on one side. A - Arms. Signs are weakness or numbness in an arm. This happens suddenly and usually on one side of the body. S - Speech. Signs are sudden trouble speaking, slurred speech, or trouble understanding what people say. T - Time. Time to call emergency services. Write down what time symptoms started. Other signs of a stroke, such as: A sudden, severe headache with no known cause. Nausea or vomiting. Seizure. These symptoms may represent a serious problem that is an emergency. Do not wait to see if the symptoms will go away. Get medical help right away. Call your local emergency services (911 in the [place] Do not drive yourself to the hospital. Summary Atrial fibrillation is a type of irregular or rapid  heartbeat (arrhythmia). Symptoms include a feeling that your heart is beating fast or irregularly. You may be given medicines to prevent blood clots or to treat heart rate or heart rhythm problems. Get help right away if you have signs or symptoms of a stroke. Get help right away if you cannot catch your breath or have chest pain or pressure. This information is not intended to replace advice given to you by your health care provider. Make sure you discuss any questions you have with your health care provider. Document Revised: 07/18/2018 Document Reviewed: 07/18/2018 Elsevier Patient Education  2023 [employer] Inc.  

## 2021-10-13 NOTE — Progress Notes (Signed)
Subjective:  Patient ID: Keith [name], male    DOB: [birth year]  Age: 81 y.o. MRN: [id number]  CC: Atrial Fibrillation, Hypertension, and Hypothyroidism   HPI Keith [name] presents for f/up -  He has recently noticed that his blood pressure and pulse have been elevated.  He is active and denies chest pain, shortness of breath, palpitations, diaphoresis, dizziness, lightheadedness, edema, or near syncope.  Outpatient Medications Prior to Visit  Medication Sig Dispense Refill   acetaminophen (TYLENOL) 500 MG tablet Take 1 tablet (500 mg total) by mouth every 6 (six) hours as needed. 30 tablet 0   albuterol (VENTOLIN HFA) 108 (90 Base) MCG/ACT inhaler Inhale 1-2 puffs into the lungs every 6 (six) hours as needed for wheezing or shortness of breath. 18 g 0   ferrous sulfate 324 MG TBEC Take 324 mg by mouth daily.     fluticasone (FLONASE) 50 MCG/ACT nasal spray Place 2 sprays into both nostrils daily. (Patient taking differently: Place 1 spray into both nostrils 2 (two) times daily.) 48 g 1   levothyroxine (SYNTHROID) 112 MCG tablet Take 1 tablet (112 mcg total) by mouth daily before breakfast. 90 tablet 1   magnesium oxide (MAG-OX) 400 MG tablet Take 400 mg by mouth daily.     Misc Natural Products ([employer] PROSTATE MAX + PO) Take 1 tablet by mouth 2 (two) times daily.     mupirocin ointment (BACTROBAN) 2 % Apply to affected area 1-2x daily for 7 days. 30 g 3   Pitavastatin Calcium (LIVALO) 1 MG TABS Take 1 tablet (1 mg total) by mouth daily. 90 tablet 1   No facility-administered medications prior to visit.    ROS Review of Systems  Constitutional: Negative.  Negative for diaphoresis and fatigue.  HENT: Negative.    Eyes: Negative.   Respiratory:  Negative for cough, chest tightness, shortness of breath and wheezing.   Cardiovascular:  Negative for chest pain, palpitations and leg swelling.  Gastrointestinal:  Negative for abdominal pain, diarrhea, nausea and vomiting.  Endocrine:  Negative.   Genitourinary: Negative.  Negative for difficulty urinating.  Musculoskeletal: Negative.  Negative for arthralgias and myalgias.  Skin: Negative.   Neurological: Negative.  Negative for dizziness and light-headedness.  Hematological:  Negative for adenopathy. Does not bruise/bleed easily.  Psychiatric/Behavioral: Negative.      Objective:  BP 130/78 (BP Location: Right Arm, Patient Position: Sitting, Cuff Size: Large)   Pulse 100   Temp 97.8 F (36.6 C) (Oral)   Ht '5\' 10"'$  (1.778 m)   Wt 197 lb (89.4 kg)   SpO2 98%   BMI 28.27 kg/m   BP Readings from Last 3 Encounters:  10/13/21 130/78  10/12/21 110/86  09/20/21 118/82    Wt Readings from Last 3 Encounters:  10/13/21 197 lb (89.4 kg)  10/12/21 199 lb (90.3 kg)  09/20/21 199 lb (90.3 kg)    Physical Exam Vitals reviewed.  Constitutional:      Appearance: Normal appearance.  HENT:     Mouth/Throat:     Mouth: Mucous membranes are moist.  Eyes:     General: No scleral icterus.    Conjunctiva/sclera: Conjunctivae normal.  Cardiovascular:     Rate and Rhythm: Tachycardia present. Rhythm irregularly irregular.     Heart sounds: Normal heart sounds, S1 normal and S2 normal. No murmur heard.    No gallop.     Comments: EKG-  A fib with RVR, 121 bpm NS ST/T wave changes No LVH or Q  waves Pulmonary:     Effort: Pulmonary effort is normal.     Breath sounds: No stridor. No wheezing, rhonchi or rales.  Abdominal:     General: Abdomen is flat.     Palpations: There is no mass.     Tenderness: There is no abdominal tenderness. There is no guarding.     Hernia: No hernia is present.  Musculoskeletal:     Right lower leg: No edema.     Left lower leg: No edema.  Skin:    General: Skin is warm and dry.  Neurological:     General: No focal deficit present.     Mental Status: He is alert.     Lab Results  Component Value Date   WBC 8.7 10/13/2021   HGB 16.3 10/13/2021   HCT 49.0 10/13/2021   PLT  164.0 10/13/2021   GLUCOSE 101 (H) 10/13/2021   CHOL 127 03/16/2021   TRIG 116.0 03/16/2021   HDL 35.80 (L) 03/16/2021   LDLCALC 68 03/16/2021   ALT 19 08/04/2020   AST 21 08/04/2020   NA 139 10/13/2021   K 3.8 10/13/2021   CL 105 10/13/2021   CREATININE 1.04 10/13/2021   BUN 17 10/13/2021   CO2 26 10/13/2021   TSH 2.21 10/13/2021   PSA 3.28 03/16/2021   INR 1.01 11/27/2017   HGBA1C 6.1 03/16/2021    DG Hand Complete Left  Result Date: 08/10/2021 CLINICAL DATA:  Patient fell yesterday with swelling in the left hand. EXAM: LEFT HAND - COMPLETE 3+ VIEW COMPARISON:  None Available. FINDINGS: Oblique moderately displaced, mildly impacted, fracture of the shaft of the little finger metacarpal. Oblique mildly displaced, mildly impacted, fracture of the shaft of the ring finger metacarpal. No significant angulation. Overlying soft tissue swelling. Narrowing of the radiocarpal joint. 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 Moderate osteoarthritis of the middle finger MCP joint. Mild osteoarthritis of scattered IP joints. Sequela of prior trauma at the ulnar styloid. Vascular calcifications in the visualized distal left forearm and left hand. IMPRESSION: 1. Oblique moderately displaced, mildly impacted fracture of the shaft of the little finger metacarpal. No significant angulation. 2. Oblique mildly displaced, mildly impacted fracture of the shaft of the ring finger metacarpal. No significant angulation. 3. Soft tissue swelling at the ulnar and dorsal aspect of the hand. 4. Osteoarthritis of scattered joints in the wrist and hand as described in the body of the report. Electronically Signed   By: Ileana Roup M.D.   On: 08/10/2021 20:23    Assessment & Plan:   Keith [name] was seen today for atrial fibrillation, hypertension and hypothyroidism.  Diagnoses and all orders for this visit:  Acquired hypothyroidism- He is euthyroid. -     TSH; Future -     TSH  Vitamin B12 deficiency anemia due  to intrinsic factor deficiency -     CBC with Differential/Platelet; Future -     Folate; Future -     Folate -     CBC with Differential/Platelet  Tachycardia, unspecified- He has developed A-fib with RVR. -     TSH; Future -     CBC with Differential/Platelet; Future -     EKG 12-Lead -     CBC with Differential/Platelet -     TSH  Atrial fibrillation with RVR ([place])- His CHA2DS2-VASc score is 2.  Will refer to cardiology and see if they recommend anticoagulation. -     Ambulatory referral to Cardiology -  Basic metabolic panel; Future -     diltiazem (CARDIZEM CD) 120 MG 24 hr capsule; Take 1 capsule (120 mg total) by mouth daily. -     Basic metabolic panel  Chronic hyperglycemia -     Basic metabolic panel; Future -     Basic metabolic panel  Other orders -     Pneumococcal polysaccharide vaccine 23-valent greater than or equal to 2yo subcutaneous/IM   I am having Keith [name] start on diltiazem. I am also having him maintain his fluticasone, magnesium oxide, ferrous sulfate, albuterol, Misc Natural Products ([employer] PROSTATE MAX + PO), levothyroxine, Livalo, acetaminophen, and mupirocin ointment.  Meds ordered this encounter  Medications   diltiazem (CARDIZEM CD) 120 MG 24 hr capsule    Sig: Take 1 capsule (120 mg total) by mouth daily.    Dispense:  90 capsule    Refill:  0     Follow-up: Return in about 3 months (around 01/12/2022).  Scarlette Calico, MD

## 2021-10-14 ENCOUNTER — Encounter: Payer: Self-pay | Admitting: Internal Medicine

## 2021-10-26 ENCOUNTER — Encounter: Payer: Self-pay | Admitting: Physical Therapy

## 2021-10-26 ENCOUNTER — Ambulatory Visit: Payer: Medicare PPO | Attending: Sports Medicine | Admitting: Physical Therapy

## 2021-10-26 DIAGNOSIS — M25642 Stiffness of left hand, not elsewhere classified: Secondary | ICD-10-CM | POA: Insufficient documentation

## 2021-10-26 DIAGNOSIS — M79645 Pain in left finger(s): Secondary | ICD-10-CM | POA: Insufficient documentation

## 2021-10-26 DIAGNOSIS — S62325D Displaced fracture of shaft of fourth metacarpal bone, left hand, subsequent encounter for fracture with routine healing: Secondary | ICD-10-CM | POA: Insufficient documentation

## 2021-10-26 DIAGNOSIS — S62327D Displaced fracture of shaft of fifth metacarpal bone, left hand, subsequent encounter for fracture with routine healing: Secondary | ICD-10-CM | POA: Diagnosis not present

## 2021-10-26 DIAGNOSIS — M6281 Muscle weakness (generalized): Secondary | ICD-10-CM | POA: Insufficient documentation

## 2021-10-26 DIAGNOSIS — R29898 Other symptoms and signs involving the musculoskeletal system: Secondary | ICD-10-CM | POA: Diagnosis not present

## 2021-10-26 NOTE — Therapy (Signed)
OUTPATIENT PHYSICAL THERAPY SHOULDER EVALUATION   Patient Name: Keith [name] MRN: [id number] DOB:[birth year], 81 y.o., male Today's Date: 10/26/2021   PT End of Session - 10/26/21 1155     Visit Number 1    Number of Visits 13    Date for PT Re-Evaluation 12/10/21    Authorization Type [employer]    Authorization Time Period 10/26/21 to 12/10/21    Progress Note Due on Visit 10    PT Start Time 1100    PT Stop Time 1138    PT Time Calculation (min) 38 min    Activity Tolerance Patient tolerated treatment well    Behavior During Therapy WFL for tasks assessed/performed             Past Medical History:  Diagnosis Date   Allergic rhinitis    Cancer ([place])    SKIN CANCER ON LIP (30 YR AGO)   DDD (degenerative disc disease), cervical    Hyperlipidemia    Hypothyroidism    PONV (postoperative nausea and vomiting)    2016 BACK SURGERY, IN RECOVERY HE HAD N/V   Past Surgical History:  Procedure Laterality Date   AMPUTATED Right 1961   TRAUMATIC AMPUTATED FINGERS RIGHT    BACK SURGERY  2016   TONSILLECTOMY     TRANSFORAMINAL LUMBAR INTERBODY FUSION (TLIF) WITH PEDICLE SCREW FIXATION 1 LEVEL Right 11/29/2017   Procedure: RIGHT SIDED LUMBAR 4-5 TRANSFORAMINAL LUMBAR INTERBODY FUSION WITH INSTRUMENTATION AND ALLOGRAFT;  Surgeon: Phylliss Bob, MD;  Location: [place];  Service: Orthopedics;  Laterality: Right;   Patient Active Problem List   Diagnosis Date Noted   Tachycardia, unspecified 10/13/2021   Atrial fibrillation with RVR ([facility]) 10/13/2021   Chronic hyperglycemia 03/16/2021   B12 nutritional deficiency 03/16/2021   Vitamin B12 deficiency anemia due to intrinsic factor deficiency 03/16/2021   Olecranon bursitis of left elbow 09/10/2020   Lumbar radiculopathy, right 09/18/2019   AC (acromioclavicular) arthritis 10/11/2018   Routine general medical examination at a health care facility 12/07/2016   Senile purpura ([place]) 01/14/2015   Arthritis of sacroiliac joint 08/27/2014    Renal cyst 05/11/2014   CMC arthritis, thumb, degenerative 07/24/2013   Prostate nodule 12/15/2011   Osteoarthritis of hip 10/06/2009   Hypothyroidism 10/15/2008   Hyperlipidemia with target LDL less than 130 03/03/2008   Allergic rhinitis 03/03/2008    PCP: Scarlette Calico   REFERRING PROVIDER: Glennon Mac, DO   REFERRING DIAG: [phone] (ICD-10-CM) - Closed displaced fracture of shaft of fifth metacarpal bone of left hand with routine healing, subsequent encounter S62.325D (ICD-10-CM) - Closed displaced fracture of shaft of fourth metacarpal bone of left hand with routine healing, subsequent encounter   THERAPY DIAG:  Pain in left finger(s) - Plan: PT plan of care cert/re-cert  Stiffness of left hand, not elsewhere classified - Plan: PT plan of care cert/re-cert  Muscle weakness (generalized) - Plan: PT plan of care cert/re-cert  Other symptoms and signs involving the musculoskeletal system - Plan: PT plan of care cert/re-cert  Rationale for Evaluation and Treatment Rehabilitation  ONSET DATE: 10/12/2021   SUBJECTIVE:  SUBJECTIVE STATEMENT:  It feels like I have arthritis. Still having a bit of pain in my hand. I can't completely close my hand. I was in a brace for awhile I think that caused my 3rd finger to not straighten out.   PERTINENT HISTORY: 1. Closed displaced fracture of shaft of fifth metacarpal bone of left hand with routine healing, subsequent encounter 2. Closed displaced fracture of shaft of fourth metacarpal bone of left hand with routine healing, subsequent encounter  -Subacute, improving, subsequent visit - Similar displacement with interval healing of fractures of fourth and fifth metacarpal shafts on x-ray without significant TTP on physical exam - Patient has likely  experienced nearly full healing of fracture sites with it being just over 8 weeks since fracture.  At this time he may discontinue exocast - Start physical therapy to improve finger flexion and extension range of motion and strength   Pertinent previous records reviewed include none   Follow Up: As needed  PAIN:  Are you having pain? No just tight   PRECAUTIONS: None  WEIGHT BEARING RESTRICTIONS No  FALLS:  Has patient fallen in last 6 months? Yes. 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 Fracture lines remain visible.      PATIENT SURVEYS:  FOTO 64.5  COGNITION:  Overall cognitive status: Within functional limits for tasks assessed     SENSATION: WFL Not tested no reports of numbness   POSTURE:   UPPER EXTREMITY ROM:   Active ROM Right eval Left eval  Shoulder flexion    Shoulder extension    Shoulder abduction    Shoulder adduction    Shoulder internal rotation    Shoulder external rotation    Elbow flexion    Elbow extension    Wrist flexion  45  Wrist extension  56  Wrist ulnar deviation  42  Wrist radial deviation  22  Wrist pronation  WNL   Wrist supination  WNL   (Blank rows = not tested)   MCP ROM seems WNL in general  4th PIP joint moderately limited with flexion, 3rd PIP joint moderately limited with flexion 4th PIP joint lacking about 15  degrees from full extension, 3rd PIP lacking about 30 degrees from full extension DIPS on 3rd and 4th L hand lacking small amount of extension  UPPER EXTREMITY MMT:  MMT Right eval Left eval  Shoulder flexion    Shoulder extension    Shoulder abduction    Shoulder adduction    Shoulder internal rotation    Shoulder external rotation    Middle trapezius    Lower trapezius    Elbow flexion    Elbow extension    Wrist flexion    Wrist extension    Wrist ulnar deviation    Wrist radial deviation    Wrist pronation    Wrist supination    Grip strength (lbs)    (Blank rows = not tested)  Finger  ABD 3/5 L hand, 4/5 R hand Grip strength limited by about 75% L hand as compared to R, limited by finger ROM Finger extension 3-/5 L UE, 4/5 R UE   SHOULDER SPECIAL TESTS:    JOINT MOBILITY TESTING:  PIPs and DIPS L 3rd and 4th fingers significantly limited and hypomobile   PALPATION:     TODAY'S TREATMENT:  Education about POC, HEP, prognosis    PATIENT EDUCATION: Education details: prognosis, HEP, POC  Person educated: Patient Education method: Handouts Education comprehension: verbalized understanding, returned demonstration, and needs further education   HOME EXERCISE PROGRAM: [id number]  ASSESSMENT:  CLINICAL IMPRESSION: Patient is a 81 y.o. M who was seen today for physical therapy evaluation and treatment for care s/p 3rd and 4th metacarpal fractures. Per MD note, fractures are generally healed, no known restrictions for therapy. At this point Mr. Reczek is having quite a bit of difficulty fully extending PIPS and DIPS on 3rd and 4th fingers, also shows significant limitation in ability to functionally open and close his hand at this time. Will benefit from skilled PT services to address functional limitations- we will plan to monitor closely, may need to consider referral to hand specialist OT/PT if he does not progress as expected with general PT interventions.     OBJECTIVE IMPAIRMENTS decreased ROM, decreased strength, hypomobility, increased fascial restrictions, impaired flexibility, impaired UE functional use, and pain.   ACTIVITY LIMITATIONS carrying, lifting, bathing, toileting, dressing, and hygiene/grooming  PARTICIPATION LIMITATIONS: meal prep, cleaning, laundry, medication management, driving, shopping, and community activity  PERSONAL FACTORS Past/current experiences are also affecting patient's functional outcome.   REHAB POTENTIAL: Good  CLINICAL DECISION MAKING: Stable/uncomplicated  EVALUATION COMPLEXITY: Low   GOALS: Goals reviewed with patient? Yes  SHORT TERM GOALS: Target date: 11/16/2021  (Remove Blue Hyperlink)  Will be compliant with appropriate progressive HEP  Baseline: Goal status: INITIAL  2.  Will demonstrate full active extension in PIPs and DIPs 3rd and 4th fingers of L hand  Baseline:  Goal status: INITIAL  3.  Will demonstrate the ability to actively make a complete fist with L hand  Baseline:  Goal status: INITIAL    LONG TERM GOALS: Target date: 12/07/2021  (Remove Blue Hyperlink)  Will demonstrate no more than a 25% difference in grip strength between L and R hands  Baseline:  Goal status: INITIAL  2.  Will score 4/5 on finger extension and ABD L hand  Baseline:  Goal status: INITIAL  3.  Will be able to carry objects up to 20# with L hand for at least 10 minutes without difficulty  Baseline:  Goal status: INITIAL  4.  Will be able to oppose fingers on L hand without difficulty  Baseline:  Goal status: INITIAL  5.  Will be able to use L hand for functional tasks like dressing and bathing without difficulty  Baseline:  Goal status: INITIAL     PLAN: PT FREQUENCY: 2x/week  PT DURATION: 6 weeks  PLANNED INTERVENTIONS: Therapeutic exercises, Therapeutic activity, Neuromuscular re-education, Patient/Family education, Self Care, Electrical stimulation, Cryotherapy, Moist heat,  Ultrasound, Ionotophoresis '4mg'$ /ml Dexamethasone, Manual therapy, and Re-evaluation  PLAN FOR NEXT SESSION: joint mobs to PIPs and DIPS, work on hand opening and closing/finger extension and flexion, grip strength, finger strength, manual. Monitor closely, might need to refer to hand specialist if progress is limited.     Tonatiuh Mallon U PT DPT PN2  10/26/2021, 12:00 PM

## 2021-10-29 ENCOUNTER — Ambulatory Visit: Payer: Medicare PPO

## 2021-11-01 ENCOUNTER — Ambulatory Visit (INDEPENDENT_AMBULATORY_CARE_PROVIDER_SITE_OTHER): Payer: Medicare PPO

## 2021-11-01 DIAGNOSIS — E538 Deficiency of other specified B group vitamins: Secondary | ICD-10-CM | POA: Diagnosis not present

## 2021-11-01 MED ORDER — CYANOCOBALAMIN 1000 MCG/ML IJ SOLN
1000.0000 ug | Freq: Once | INTRAMUSCULAR | Status: AC
  Administered 2021-11-01: 1000 ug via INTRAMUSCULAR

## 2021-11-01 NOTE — Progress Notes (Signed)
After obtaining consent, and per orders of Dr. Jones, injection of B12 given in the left deltoid by Kristena Wilhelmi P Raygen Linquist. Patient instructed to report any adverse reaction to me immediately.  

## 2021-11-02 ENCOUNTER — Ambulatory Visit: Payer: Medicare PPO

## 2021-11-02 DIAGNOSIS — R29898 Other symptoms and signs involving the musculoskeletal system: Secondary | ICD-10-CM

## 2021-11-02 DIAGNOSIS — S62325D Displaced fracture of shaft of fourth metacarpal bone, left hand, subsequent encounter for fracture with routine healing: Secondary | ICD-10-CM | POA: Diagnosis not present

## 2021-11-02 DIAGNOSIS — M79645 Pain in left finger(s): Secondary | ICD-10-CM | POA: Diagnosis not present

## 2021-11-02 DIAGNOSIS — M25642 Stiffness of left hand, not elsewhere classified: Secondary | ICD-10-CM | POA: Diagnosis not present

## 2021-11-02 DIAGNOSIS — S62327D Displaced fracture of shaft of fifth metacarpal bone, left hand, subsequent encounter for fracture with routine healing: Secondary | ICD-10-CM | POA: Diagnosis not present

## 2021-11-02 DIAGNOSIS — M6281 Muscle weakness (generalized): Secondary | ICD-10-CM | POA: Diagnosis not present

## 2021-11-02 NOTE — Therapy (Signed)
OUTPATIENT PHYSICAL THERAPY HAND TREATMENT   Patient Name: Keith [name] MRN: [id number] DOB:1940/03/16, 81 y.o., male Today's Date: 11/02/2021   PT End of Session - 11/02/21 1545     Visit Number 2    Number of Visits 13    Date for PT Re-Evaluation 12/10/21    Authorization Type [employer]    Authorization Time Period 10/26/21 to 12/10/21    Progress Note Due on Visit 10    PT Start Time 1545    PT Stop Time 1626    PT Time Calculation (min) 41 min    Activity Tolerance Patient tolerated treatment well    Behavior During Therapy WFL for tasks assessed/performed              Past Medical History:  Diagnosis Date   Allergic rhinitis    Cancer ([place])    SKIN CANCER ON LIP (30 YR AGO)   DDD (degenerative disc disease), cervical    Hyperlipidemia    Hypothyroidism    PONV (postoperative nausea and vomiting)    2016 BACK SURGERY, IN RECOVERY HE HAD N/V   Past Surgical History:  Procedure Laterality Date   AMPUTATED Right 1961   TRAUMATIC AMPUTATED FINGERS RIGHT    BACK SURGERY  2016   TONSILLECTOMY     TRANSFORAMINAL LUMBAR INTERBODY FUSION (TLIF) WITH PEDICLE SCREW FIXATION 1 LEVEL Right 11/29/2017   Procedure: RIGHT SIDED LUMBAR 4-5 TRANSFORAMINAL LUMBAR INTERBODY FUSION WITH INSTRUMENTATION AND ALLOGRAFT;  Surgeon: Phylliss Bob, MD;  Location: [facility];  Service: Orthopedics;  Laterality: Right;   Patient Active Problem List   Diagnosis Date Noted   Tachycardia, unspecified 10/13/2021   Atrial fibrillation with RVR (HCC) 10/13/2021   Chronic hyperglycemia 03/16/2021   B12 nutritional deficiency 03/16/2021   Vitamin B12 deficiency anemia due to intrinsic factor deficiency 03/16/2021   Olecranon bursitis of left elbow 09/10/2020   Lumbar radiculopathy, right 09/18/2019   AC (acromioclavicular) arthritis 10/11/2018   Routine general medical examination at a health care facility 12/07/2016   Senile purpura ([place]) 01/14/2015   Arthritis of sacroiliac joint 08/27/2014    Renal cyst 05/11/2014   CMC arthritis, thumb, degenerative 07/24/2013   Prostate nodule 12/15/2011   Osteoarthritis of hip 10/06/2009   Hypothyroidism 10/15/2008   Hyperlipidemia with target LDL less than 130 03/03/2008   Allergic rhinitis 03/03/2008    PCP: Scarlette Calico   REFERRING PROVIDER: Glennon Mac, DO   REFERRING DIAG: [phone] (ICD-10-CM) - Closed displaced fracture of shaft of fifth metacarpal bone of left hand with routine healing, subsequent encounter S62.325D (ICD-10-CM) - Closed displaced fracture of shaft of fourth metacarpal bone of left hand with routine healing, subsequent encounter   THERAPY DIAG:  Pain in left finger(s)  Stiffness of left hand, not elsewhere classified  Muscle weakness (generalized)  Other symptoms and signs involving the musculoskeletal system  Rationale for Evaluation and Treatment Rehabilitation  ONSET DATE: 10/12/2021   SUBJECTIVE:  SUBJECTIVE STATEMENT:  Good, I have been bending and stretching. It feels like it's healing but it feels arthritis in my hand.   PERTINENT HISTORY: 1. Closed displaced fracture of shaft of fifth metacarpal bone of left hand with routine healing, subsequent encounter 2. Closed displaced fracture of shaft of fourth metacarpal bone of left hand with routine healing, subsequent encounter  -Subacute, improving, subsequent visit - Similar displacement with interval healing of fractures of fourth and fifth metacarpal shafts on x-ray without significant TTP on physical exam - Patient has likely experienced nearly full healing of fracture sites with it being just over 8 weeks since fracture.  At this time he may discontinue exocast - Start physical therapy to improve finger flexion and extension range of motion and strength    Pertinent previous records reviewed include none   Follow Up: As needed  PAIN:  Are you having pain? No just tight   PRECAUTIONS: None  WEIGHT BEARING RESTRICTIONS No  FALLS:  Has patient fallen in last 6 months? Yes. 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 Fracture lines remain visible.      PATIENT SURVEYS:  FOTO 64.5  COGNITION:  Overall cognitive status: Within functional limits for tasks assessed     SENSATION: WFL Not tested no reports of numbness   POSTURE:   UPPER EXTREMITY ROM:   Active ROM Right eval Left eval  Shoulder flexion    Shoulder extension    Shoulder abduction    Shoulder adduction    Shoulder internal rotation    Shoulder external rotation    Elbow flexion    Elbow extension    Wrist flexion  45  Wrist extension  56  Wrist ulnar deviation  42  Wrist radial deviation  22  Wrist pronation  WNL   Wrist supination  WNL   (Blank rows = not tested)   MCP ROM seems WNL in general  4th PIP joint moderately limited with flexion, 3rd PIP joint moderately limited with flexion 4th PIP joint lacking about 15 degrees from full extension, 3rd PIP lacking about 30 degrees from full extension DIPS on 3rd and 4th L hand lacking small amount of extension  UPPER EXTREMITY MMT:  MMT Right eval Left eval  Shoulder flexion    Shoulder extension     Shoulder abduction    Shoulder adduction    Shoulder internal rotation    Shoulder external rotation    Middle trapezius    Lower trapezius    Elbow flexion    Elbow extension    Wrist flexion    Wrist extension    Wrist ulnar deviation    Wrist radial deviation    Wrist pronation    Wrist supination    Grip strength (lbs)    (Blank rows = not tested)  Finger ABD 3/5 L hand, 4/5 R hand Grip strength limited by about 75% L hand as compared to R, limited  by finger ROM Finger extension 3-/5 L UE, 4/5 R UE   SHOULDER SPECIAL TESTS:    JOINT MOBILITY TESTING:  PIPs and DIPS L 3rd and 4th fingers significantly limited and hypomobile   PALPATION:     TODAY'S TREATMENT:  Finger abd/add x10  Fingertip to thumb x10  Flexion/extension of fingers x10  Single finger extension, difficulty with ring finger x10 Wrist flexion stretch 30s  Wrist ext stretch 30s  Seated DIP and PIP flexion 2x10 Play with putty  -finger ext x10 -finger add x10 Wrist curls and ext 5# 2x10 Supination and pronation 5# 2x10      PATIENT EDUCATION: Education details: prognosis, HEP, POC  Person educated: Patient Education method: Handouts Education comprehension: verbalized understanding, returned demonstration, and needs further education   HOME EXERCISE PROGRAM: [id number]  ASSESSMENT:  CLINICAL IMPRESSION: Patient returns with no changes in hand. We continued working with some ROM and strengthening of the hands. He was given putty to use at home. Patient is concerned that PT won't help and he can do a lot of his exercises at home. We agreed to continue therapy for 2x a week for the first couple of weeks and decide from there. He has the most difficulty with finger and wrist extension exercises today.    OBJECTIVE IMPAIRMENTS decreased ROM, decreased strength, hypomobility, increased fascial restrictions, impaired flexibility, impaired UE functional use, and pain.   ACTIVITY LIMITATIONS  carrying, lifting, bathing, toileting, dressing, and hygiene/grooming  PARTICIPATION LIMITATIONS: meal prep, cleaning, laundry, medication management, driving, shopping, and community activity  PERSONAL FACTORS Past/current experiences are also affecting patient's functional outcome.   REHAB POTENTIAL: Good  CLINICAL DECISION MAKING: Stable/uncomplicated  EVALUATION COMPLEXITY: Low   GOALS: Goals reviewed with patient? Yes  SHORT TERM GOALS: Target date: 11/16/2021  (Remove Blue Hyperlink)  Will be compliant with appropriate progressive HEP  Baseline: Goal status: INITIAL  2.  Will demonstrate full active extension in PIPs and DIPs 3rd and 4th fingers of L hand  Baseline:  Goal status: INITIAL  3.  Will demonstrate the ability to actively make a complete fist with L hand  Baseline:  Goal status: INITIAL    LONG TERM GOALS: Target date: 12/07/2021  (Remove Blue Hyperlink)  Will demonstrate no more than a 25% difference in grip strength between L and R hands  Baseline:  Goal status: INITIAL  2.  Will score 4/5 on finger extension and ABD L hand  Baseline:  Goal status: INITIAL  3.  Will be able to carry objects up to 20# with L hand for at least 10 minutes without difficulty  Baseline:  Goal status: INITIAL  4.  Will be able to oppose fingers on L hand without difficulty  Baseline:  Goal status: INITIAL  5.  Will be able to use L hand for functional tasks like dressing and bathing without difficulty  Baseline:  Goal status: INITIAL     PLAN: PT FREQUENCY: 2x/week  PT DURATION: 6 weeks  PLANNED INTERVENTIONS: Therapeutic exercises, Therapeutic activity, Neuromuscular re-education, Patient/Family education, Self Care, Electrical stimulation, Cryotherapy, Moist heat, Ultrasound, Ionotophoresis '4mg'$ /ml Dexamethasone, Manual therapy, and Re-evaluation  PLAN FOR NEXT SESSION: joint mobs to PIPs and DIPS, work on hand opening and closing/finger extension and  flexion, grip strength, finger strength, manual. Monitor closely, might need to refer to hand specialist if progress is limited.     Ann Lions PT DPT PN2  11/02/2021, 4:27 PM

## 2021-11-08 ENCOUNTER — Ambulatory Visit: Payer: Medicare PPO | Attending: Sports Medicine

## 2021-11-08 DIAGNOSIS — M6281 Muscle weakness (generalized): Secondary | ICD-10-CM | POA: Insufficient documentation

## 2021-11-08 DIAGNOSIS — M79645 Pain in left finger(s): Secondary | ICD-10-CM | POA: Insufficient documentation

## 2021-11-08 DIAGNOSIS — R29898 Other symptoms and signs involving the musculoskeletal system: Secondary | ICD-10-CM | POA: Diagnosis not present

## 2021-11-08 DIAGNOSIS — M25642 Stiffness of left hand, not elsewhere classified: Secondary | ICD-10-CM | POA: Diagnosis not present

## 2021-11-08 NOTE — Therapy (Signed)
OUTPATIENT PHYSICAL THERAPY SHOULDER TREATMENT   Patient Name: Keith [name] MRN: [id number] DOB:[birth year], 81 y.o., male Today's Date: 11/08/2021   PT End of Session - 11/08/21 1613     Visit Number 3    Number of Visits 13    Date for PT Re-Evaluation 12/10/21    Authorization Type [employer]    Authorization Time Period 10/26/21 to 12/10/21    Progress Note Due on Visit 10    PT Start Time 1615    PT Stop Time 1700    PT Time Calculation (min) 45 min    Activity Tolerance Patient tolerated treatment well    Behavior During Therapy WFL for tasks assessed/performed              Past Medical History:  Diagnosis Date   Allergic rhinitis    Cancer ([place])    SKIN CANCER ON LIP (30 YR AGO)   DDD (degenerative disc disease), cervical    Hyperlipidemia    Hypothyroidism    PONV (postoperative nausea and vomiting)    2016 BACK SURGERY, IN RECOVERY HE HAD N/V   Past Surgical History:  Procedure Laterality Date   AMPUTATED Right 1961   TRAUMATIC AMPUTATED FINGERS RIGHT    BACK SURGERY  2016   TONSILLECTOMY     TRANSFORAMINAL LUMBAR INTERBODY FUSION (TLIF) WITH PEDICLE SCREW FIXATION 1 LEVEL Right 11/29/2017   Procedure: RIGHT SIDED LUMBAR 4-5 TRANSFORAMINAL LUMBAR INTERBODY FUSION WITH INSTRUMENTATION AND ALLOGRAFT;  Surgeon: Phylliss Bob, MD;  Location: [facility];  Service: Orthopedics;  Laterality: Right;   Patient Active Problem List   Diagnosis Date Noted   Tachycardia, unspecified 10/13/2021   Atrial fibrillation with RVR (HCC) 10/13/2021   Chronic hyperglycemia 03/16/2021   B12 nutritional deficiency 03/16/2021   Vitamin B12 deficiency anemia due to intrinsic factor deficiency 03/16/2021   Olecranon bursitis of left elbow 09/10/2020   Lumbar radiculopathy, right 09/18/2019   AC (acromioclavicular) arthritis 10/11/2018   Routine general medical examination at a health care facility 12/07/2016   Senile purpura ([place]) 01/14/2015   Arthritis of sacroiliac joint 08/27/2014    Renal cyst 05/11/2014   CMC arthritis, thumb, degenerative 07/24/2013   Prostate nodule 12/15/2011   Osteoarthritis of hip 10/06/2009   Hypothyroidism 10/15/2008   Hyperlipidemia with target LDL less than 130 03/03/2008   Allergic rhinitis 03/03/2008    PCP: Scarlette Calico   REFERRING PROVIDER: Glennon Mac, DO   REFERRING DIAG: [phone] (ICD-10-CM) - Closed displaced fracture of shaft of fifth metacarpal bone of left hand with routine healing, subsequent encounter S62.325D (ICD-10-CM) - Closed displaced fracture of shaft of fourth metacarpal bone of left hand with routine healing, subsequent encounter   THERAPY DIAG:  Stiffness of left hand, not elsewhere classified  Muscle weakness (generalized)  Other symptoms and signs involving the musculoskeletal system  Pain in left finger(s)  Rationale for Evaluation and Treatment Rehabilitation  ONSET DATE: 10/12/2021   SUBJECTIVE:  SUBJECTIVE STATEMENT:  Good, I have been bending and stretching. It feels like it's healing but it feels arthritis in my hand.   PERTINENT HISTORY: 1. Closed displaced fracture of shaft of fifth metacarpal bone of left hand with routine healing, subsequent encounter 2. Closed displaced fracture of shaft of fourth metacarpal bone of left hand with routine healing, subsequent encounter  -Subacute, improving, subsequent visit - Similar displacement with interval healing of fractures of fourth and fifth metacarpal shafts on x-ray without significant TTP on physical exam - Patient has likely experienced nearly full healing of fracture sites with it being just over 8 weeks since fracture.  At this time he may discontinue exocast - Start physical therapy to improve finger flexion and extension range of motion and strength    Pertinent previous records reviewed include none   Follow Up: As needed  PAIN:  Are you having pain? No just tight   PRECAUTIONS: None  WEIGHT BEARING RESTRICTIONS No  FALLS:  Has patient fallen in last 6 months? Yes. 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 Fracture lines remain visible.      PATIENT SURVEYS:  FOTO 64.5  COGNITION:  Overall cognitive status: Within functional limits for tasks assessed     SENSATION: WFL Not tested no reports of numbness   POSTURE:   UPPER EXTREMITY ROM:   Active ROM Right eval Left eval  Shoulder flexion    Shoulder extension    Shoulder abduction    Shoulder adduction    Shoulder internal rotation    Shoulder external rotation    Elbow flexion    Elbow extension    Wrist flexion  45  Wrist extension  56  Wrist ulnar deviation  42  Wrist radial deviation  22  Wrist pronation  WNL   Wrist supination  WNL   (Blank rows = not tested)   MCP ROM seems WNL in general  4th PIP joint moderately limited with flexion, 3rd PIP joint moderately limited with flexion 4th PIP joint lacking about 15 degrees from full extension, 3rd PIP lacking about 30 degrees from full extension DIPS on 3rd and 4th L hand lacking small amount of extension  UPPER EXTREMITY MMT:  MMT Right eval Left eval  Shoulder flexion    Shoulder extension     Shoulder abduction    Shoulder adduction    Shoulder internal rotation    Shoulder external rotation    Middle trapezius    Lower trapezius    Elbow flexion    Elbow extension    Wrist flexion    Wrist extension    Wrist ulnar deviation    Wrist radial deviation    Wrist pronation    Wrist supination    Grip strength (lbs)    (Blank rows = not tested)  Finger ABD 3/5 L hand, 4/5 R hand Grip strength limited by about 75% L hand as compared to R, limited  by finger ROM Finger extension 3-/5 L UE, 4/5 R UE   SHOULDER SPECIAL TESTS:    JOINT MOBILITY TESTING:  PIPs and DIPS L 3rd and 4th fingers significantly limited and hypomobile   PALPATION:     TODAY'S TREATMENT:  11/08/21 Making a fist x10 DIP flexion and extension x10 Ring and pinky finger ext- unable to ligt off ring finger Ball squeezes yellow smiley face ball 2x10 Hand gripper black coil 25# x10, then 35# x10 difficulty noted  Supination and pronation with greenTB 2x10 Wrist curls 5# 2x10 Wrist ext 5# 2x10  Finger adduction  Velcro board supination and pronation and lifting with ring and pinky finger    11/02/21 Finger abd/add x10  Fingertip to thumb x10  Flexion/extension of fingers x10  Single finger extension, difficulty with ring finger x10 Wrist flexion stretch 30s  Wrist ext stretch 30s  Seated DIP and PIP flexion 2x10 Play with putty  -finger ext x10 -finger add x10 Wrist curls and ext 5# 2x10 Supination and pronation 5# 2x10  Education about POC, HEP, prognosis    PATIENT EDUCATION: Education details: prognosis, HEP, POC  Person educated: Patient Education method: Handouts Education comprehension: verbalized understanding, returned demonstration, and needs further education   HOME EXERCISE PROGRAM: [id number]  ASSESSMENT:  CLINICAL IMPRESSION: Patient returns with no changes in hand. Tested his grip strength today, 60# on R and 20# on the left. He has the most difficulty with ring  finger and wrist extension exercises today. More difficulty with supination exercise using band compared to pronation. He would like me to ask his doctor for an OT referral as he hopes that she can help him make faster progress with his strength and ROM.   OBJECTIVE IMPAIRMENTS decreased ROM, decreased strength, hypomobility, increased fascial restrictions, impaired flexibility, impaired UE functional use, and pain.   ACTIVITY LIMITATIONS carrying, lifting, bathing, toileting, dressing, and hygiene/grooming  PARTICIPATION LIMITATIONS: meal prep, cleaning, laundry, medication management, driving, shopping, and community activity  PERSONAL FACTORS Past/current experiences are also affecting patient's functional outcome.   REHAB POTENTIAL: Good  CLINICAL DECISION MAKING: Stable/uncomplicated  EVALUATION COMPLEXITY: Low   GOALS: Goals reviewed with patient? Yes  SHORT TERM GOALS: Target date: 11/16/2021  (Remove Blue Hyperlink)  Will be compliant with appropriate progressive HEP  Baseline: Goal status: INITIAL  2.  Will demonstrate full active extension in PIPs and DIPs 3rd and 4th fingers of L hand  Baseline:  Goal status: INITIAL  3.  Will demonstrate the ability to actively make a complete fist with L hand  Baseline:  Goal status: INITIAL    LONG TERM GOALS: Target date: 12/07/2021  (Remove Blue Hyperlink)  Will demonstrate no more than a 25% difference in grip strength between L and R hands  Baseline:  Goal status: INITIAL  2.  Will score 4/5 on finger extension and ABD L hand  Baseline:  Goal status: INITIAL  3.  Will be able to carry objects up to 20# with L hand for at least 10 minutes without difficulty  Baseline:  Goal status: INITIAL  4.  Will be able to oppose fingers on L hand without difficulty  Baseline:  Goal status: INITIAL  5.  Will be able to use L hand for functional tasks like dressing and bathing without difficulty  Baseline:  Goal status:  INITIAL     PLAN: PT FREQUENCY: 2x/week  PT DURATION: 6 weeks  PLANNED INTERVENTIONS: Therapeutic exercises, Therapeutic activity, Neuromuscular re-education, Patient/Family education, Self Care, Electrical stimulation,  Cryotherapy, Moist heat, Ultrasound, Ionotophoresis '4mg'$ /ml Dexamethasone, Manual therapy, and Re-evaluation  PLAN FOR NEXT SESSION: joint mobs to PIPs and DIPS, work on hand opening and closing/finger extension and flexion, grip strength, finger strength, manual. Monitor closely, might need to refer to hand specialist if progress is limited.     Ann Lions PT DPT PN2  11/08/2021, 5:42 PM

## 2021-11-09 ENCOUNTER — Other Ambulatory Visit: Payer: Self-pay | Admitting: Sports Medicine

## 2021-11-09 DIAGNOSIS — S62325A Displaced fracture of shaft of fourth metacarpal bone, left hand, initial encounter for closed fracture: Secondary | ICD-10-CM

## 2021-11-09 DIAGNOSIS — S92425D Nondisplaced fracture of distal phalanx of left great toe, subsequent encounter for fracture with routine healing: Secondary | ICD-10-CM

## 2021-11-09 DIAGNOSIS — S62327A Displaced fracture of shaft of fifth metacarpal bone, left hand, initial encounter for closed fracture: Secondary | ICD-10-CM

## 2021-11-09 DIAGNOSIS — S62327D Displaced fracture of shaft of fifth metacarpal bone, left hand, subsequent encounter for fracture with routine healing: Secondary | ICD-10-CM

## 2021-11-09 DIAGNOSIS — S92425B Nondisplaced fracture of distal phalanx of left great toe, initial encounter for open fracture: Secondary | ICD-10-CM

## 2021-11-09 DIAGNOSIS — S62325D Displaced fracture of shaft of fourth metacarpal bone, left hand, subsequent encounter for fracture with routine healing: Secondary | ICD-10-CM

## 2021-11-10 ENCOUNTER — Ambulatory Visit: Payer: Medicare PPO

## 2021-11-10 DIAGNOSIS — M6281 Muscle weakness (generalized): Secondary | ICD-10-CM | POA: Diagnosis not present

## 2021-11-10 DIAGNOSIS — M25642 Stiffness of left hand, not elsewhere classified: Secondary | ICD-10-CM | POA: Diagnosis not present

## 2021-11-10 DIAGNOSIS — R29898 Other symptoms and signs involving the musculoskeletal system: Secondary | ICD-10-CM

## 2021-11-10 DIAGNOSIS — M79645 Pain in left finger(s): Secondary | ICD-10-CM | POA: Diagnosis not present

## 2021-11-10 NOTE — Therapy (Signed)
OUTPATIENT PHYSICAL THERAPY SHOULDER TREATMENT   Patient Name: Keith [name] MRN: [id number] DOB:10-04-1940, 81 y.o., male Today's Date: 11/10/2021   PT End of Session - 11/10/21 1614     Visit Number 4    Number of Visits 13    Date for PT Re-Evaluation 12/10/21    Authorization Type [employer]    Authorization Time Period 10/26/21 to 12/10/21    Progress Note Due on Visit 10    PT Start Time 8527    PT Stop Time 1700    PT Time Calculation (min) 45 min    Activity Tolerance Patient tolerated treatment well    Behavior During Therapy WFL for tasks assessed/performed               Past Medical History:  Diagnosis Date   Allergic rhinitis    Cancer ([place])    SKIN CANCER ON LIP (30 YR AGO)   DDD (degenerative disc disease), cervical    Hyperlipidemia    Hypothyroidism    PONV (postoperative nausea and vomiting)    2016 BACK SURGERY, IN RECOVERY HE HAD N/V   Past Surgical History:  Procedure Laterality Date   AMPUTATED Right 1961   TRAUMATIC AMPUTATED FINGERS RIGHT    BACK SURGERY  2016   TONSILLECTOMY     TRANSFORAMINAL LUMBAR INTERBODY FUSION (TLIF) WITH PEDICLE SCREW FIXATION 1 LEVEL Right 11/29/2017   Procedure: RIGHT SIDED LUMBAR 4-5 TRANSFORAMINAL LUMBAR INTERBODY FUSION WITH INSTRUMENTATION AND ALLOGRAFT;  Surgeon: Phylliss Bob, MD;  Location: [facility];  Service: Orthopedics;  Laterality: Right;   Patient Active Problem List   Diagnosis Date Noted   Tachycardia, unspecified 10/13/2021   Atrial fibrillation with RVR (HCC) 10/13/2021   Chronic hyperglycemia 03/16/2021   B12 nutritional deficiency 03/16/2021   Vitamin B12 deficiency anemia due to intrinsic factor deficiency 03/16/2021   Olecranon bursitis of left elbow 09/10/2020   Lumbar radiculopathy, right 09/18/2019   AC (acromioclavicular) arthritis 10/11/2018   Routine general medical examination at a health care facility 12/07/2016   Senile purpura ([place]) 01/14/2015   Arthritis of sacroiliac joint  08/27/2014   Renal cyst 05/11/2014   CMC arthritis, thumb, degenerative 07/24/2013   Prostate nodule 12/15/2011   Osteoarthritis of hip 10/06/2009   Hypothyroidism 10/15/2008   Hyperlipidemia with target LDL less than 130 03/03/2008   Allergic rhinitis 03/03/2008    PCP: Scarlette Calico   REFERRING PROVIDER: Glennon Mac, DO   REFERRING DIAG: [phone] (ICD-10-CM) - Closed displaced fracture of shaft of fifth metacarpal bone of left hand with routine healing, subsequent encounter S62.325D (ICD-10-CM) - Closed displaced fracture of shaft of fourth metacarpal bone of left hand with routine healing, subsequent encounter   THERAPY DIAG:  Stiffness of left hand, not elsewhere classified  Muscle weakness (generalized)  Other symptoms and signs involving the musculoskeletal system  Pain in left finger(s)  Rationale for Evaluation and Treatment Rehabilitation  ONSET DATE: 10/12/2021   SUBJECTIVE:  SUBJECTIVE STATEMENT:  Hand is doing good, it's hanging in there.   PERTINENT HISTORY: 1. Closed displaced fracture of shaft of fifth metacarpal bone of left hand with routine healing, subsequent encounter 2. Closed displaced fracture of shaft of fourth metacarpal bone of left hand with routine healing, subsequent encounter  -Subacute, improving, subsequent visit - Similar displacement with interval healing of fractures of fourth and fifth metacarpal shafts on x-ray without significant TTP on physical exam - Patient has likely experienced nearly full healing of fracture sites with it being just over 8 weeks since fracture.  At this time he may discontinue exocast - Start physical therapy to improve finger flexion and extension range of motion and strength   Pertinent previous records reviewed include none    Follow Up: As needed  PAIN:  Are you having pain? No just tight   PRECAUTIONS: None  WEIGHT BEARING RESTRICTIONS No  FALLS:  Has patient fallen in last 6 months? Yes. 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 Fracture lines remain visible.      PATIENT SURVEYS:  FOTO 64.5  COGNITION:  Overall cognitive status: Within functional limits for tasks assessed     SENSATION: WFL Not tested no reports of numbness   POSTURE:   UPPER EXTREMITY ROM:   Active ROM Right eval Left eval  Shoulder flexion    Shoulder extension    Shoulder abduction    Shoulder adduction    Shoulder internal rotation    Shoulder external rotation    Elbow flexion    Elbow extension    Wrist flexion  45  Wrist extension  56  Wrist ulnar deviation  42  Wrist radial deviation  22  Wrist pronation  WNL   Wrist supination  WNL   (Blank rows = not tested)   MCP ROM seems WNL in general  4th PIP joint moderately limited with flexion, 3rd PIP joint moderately limited with flexion 4th PIP joint lacking about 15 degrees from full extension, 3rd PIP lacking about 30 degrees from full extension DIPS on 3rd and 4th L hand lacking small amount of extension  UPPER EXTREMITY MMT:  MMT Right eval Left eval  Shoulder flexion    Shoulder extension    Shoulder abduction    Shoulder adduction     Shoulder internal rotation    Shoulder external rotation    Middle trapezius    Lower trapezius    Elbow flexion    Elbow extension    Wrist flexion    Wrist extension    Wrist ulnar deviation    Wrist radial deviation    Wrist pronation    Wrist supination    Grip strength (lbs)    (Blank rows = not tested)  Finger ABD 3/5 L hand, 4/5 R hand Grip strength limited by about 75% L hand as compared to R, limited by finger ROM Finger extension 3-/5 L UE, 4/5 R UE  SHOULDER SPECIAL TESTS:    JOINT MOBILITY TESTING:  PIPs and DIPS L 3rd and 4th fingers significantly limited and hypomobile   PALPATION:     TODAY'S TREATMENT:  11/10/21 Wrist flexion/ext stretches 30s  Rubber band around all five fingers 2x10 Rubber band abd 2x10  Picking up marbles with ring and pinky finger only abduction  Hand gripper 35# 2x10  Red ball supination/pronation 2x10 each Wrist ext/flexion with red ball x10 each Wrist extension with WaTE 8# 2x10   11/08/21 Making a fist x10 DIP flexion and extension x10 Ring and pinky finger ext- unable to ligt off ring finger Ball squeezes yellow smiley face ball 2x10 Hand gripper black coil 25# x10, then 35# x10 difficulty noted  Supination and pronation with greenTB 2x10 Wrist curls 5# 2x10 Wrist ext 5# 2x10  Finger adduction  Velcro board supination and pronation and lifting with ring and pinky finger    11/02/21 Finger abd/add x10  Fingertip to thumb x10  Flexion/extension of fingers x10  Single finger extension, difficulty with ring finger x10 Wrist flexion stretch 30s  Wrist ext stretch 30s  Seated DIP and PIP flexion 2x10 Play with putty  -finger ext x10 -finger add x10 Wrist curls and ext 5# 2x10 Supination and pronation 5# 2x10  Education about POC, HEP, prognosis    PATIENT EDUCATION: Education details: prognosis, HEP, POC  Person educated: Patient Education method: Handouts Education comprehension: verbalized understanding,  returned demonstration, and needs further education   HOME EXERCISE PROGRAM: [id number]  ASSESSMENT:  CLINICAL IMPRESSION: Patient returns with no changes in hand. He has received an OT evaluation to see if that will help with more fine motor skills and grip strength. We continued to work on some hand strengthening today, tolerates session well. Still has the most difficulty with grip strength.   OBJECTIVE IMPAIRMENTS decreased ROM, decreased strength, hypomobility, increased fascial restrictions, impaired flexibility, impaired UE functional use, and pain.   ACTIVITY LIMITATIONS carrying, lifting, bathing, toileting, dressing, and hygiene/grooming  PARTICIPATION LIMITATIONS: meal prep, cleaning, laundry, medication management, driving, shopping, and community activity  PERSONAL FACTORS Past/current experiences are also affecting patient's functional outcome.   REHAB POTENTIAL: Good  CLINICAL DECISION MAKING: Stable/uncomplicated  EVALUATION COMPLEXITY: Low   GOALS: Goals reviewed with patient? Yes  SHORT TERM GOALS: Target date: 11/16/2021  (Remove Blue Hyperlink)  Will be compliant with appropriate progressive HEP  Baseline: Goal status: MET  2.  Will demonstrate full active extension in PIPs and DIPs 3rd and 4th fingers of L hand  Baseline:  Goal status: IN PROGRESS  3.  Will demonstrate the ability to actively make a complete fist with L hand  Baseline:  Goal status: MET    LONG TERM GOALS: Target date: 12/07/2021  (Remove Blue Hyperlink)  Will demonstrate no more than a 25% difference in grip strength between L and R hands  Baseline: 60 v 20 11/08/21 Goal status: IN PROGRESS  2.  Will score 4/5 on finger extension and ABD L hand  Baseline:  Goal status: INITIAL  3.  Will be able to carry objects up to 20# with L hand for at least 10 minutes without difficulty  Baseline:  Goal status: INITIAL  4.  Will be able to oppose fingers on L hand without difficulty   Baseline:  Goal status: MET  5.  Will be able to use L hand for functional tasks like dressing and bathing without difficulty  Baseline:  Goal status: INITIAL     PLAN: PT  FREQUENCY: 2x/week  PT DURATION: 6 weeks  PLANNED INTERVENTIONS: Therapeutic exercises, Therapeutic activity, Neuromuscular re-education, Patient/Family education, Self Care, Electrical stimulation, Cryotherapy, Moist heat, Ultrasound, Ionotophoresis 63m/ml Dexamethasone, Manual therapy, and Re-evaluation  PLAN FOR NEXT SESSION: finger ext against rubber band, grip strengthening    Kristen U PT DPT PN2  11/10/2021, 4:58 PM

## 2021-11-10 NOTE — Progress Notes (Unsigned)
Cardiology Office Note   Date:  11/10/2021   ID:  Keith [name], DOB 1940-04-11, MRN [id number]  PCP:  Janith Lima, MD  Cardiologist:   None Referring:  ***  No chief complaint on file.     History of Present Illness: Keith [name] is a 81 y.o. male who presents for ***  evaluation of atrial fibrillation.  ***    Past Medical History:  Diagnosis Date   Allergic rhinitis    Cancer ([place])    SKIN CANCER ON LIP (30 YR AGO)   DDD (degenerative disc disease), cervical    Hyperlipidemia    Hypothyroidism    PONV (postoperative nausea and vomiting)    2016 BACK SURGERY, IN RECOVERY HE HAD N/V    Past Surgical History:  Procedure Laterality Date   AMPUTATED Right 1961   TRAUMATIC AMPUTATED FINGERS RIGHT    BACK SURGERY  2016   TONSILLECTOMY     TRANSFORAMINAL LUMBAR INTERBODY FUSION (TLIF) WITH PEDICLE SCREW FIXATION 1 LEVEL Right 11/29/2017   Procedure: RIGHT SIDED LUMBAR 4-5 TRANSFORAMINAL LUMBAR INTERBODY FUSION WITH INSTRUMENTATION AND ALLOGRAFT;  Surgeon: Phylliss Bob, MD;  Location: [facility];  Service: Orthopedics;  Laterality: Right;     Current Outpatient Medications  Medication Sig Dispense Refill   acetaminophen (TYLENOL) 500 MG tablet Take 1 tablet (500 mg total) by mouth every 6 (six) hours as needed. 30 tablet 0   albuterol (VENTOLIN HFA) 108 (90 Base) MCG/ACT inhaler Inhale 1-2 puffs into the lungs every 6 (six) hours as needed for wheezing or shortness of breath. 18 g 0   diltiazem (CARDIZEM CD) 120 MG 24 hr capsule Take 1 capsule (120 mg total) by mouth daily. 90 capsule 0   ferrous sulfate 324 MG TBEC Take 324 mg by mouth daily.     fluticasone (FLONASE) 50 MCG/ACT nasal spray Place 2 sprays into both nostrils daily. (Patient taking differently: Place 1 spray into both nostrils 2 (two) times daily.) 48 g 1   levothyroxine (SYNTHROID) 112 MCG tablet Take 1 tablet (112 mcg total) by mouth daily before breakfast. 90 tablet 1   magnesium oxide (MAG-OX)  400 MG tablet Take 400 mg by mouth daily.     Misc Natural Products ([employer] PROSTATE MAX + PO) Take 1 tablet by mouth 2 (two) times daily.     mupirocin ointment (BACTROBAN) 2 % Apply to affected area 1-2x daily for 7 days. 30 g 3   Pitavastatin Calcium (LIVALO) 1 MG TABS Take 1 tablet (1 mg total) by mouth daily. 90 tablet 1   No current facility-administered medications for this visit.    Allergies:   Atorvastatin and Rosuvastatin    Social History:  The patient  reports that he has never smoked. He has never used smokeless tobacco. He reports that he does not drink alcohol and does not use drugs.   Family History:  The patient's ***family history includes Breast cancer in an other family member; Coronary artery disease in his father and another family member; Diabetes in his father and mother.    ROS:  Please see the history of present illness.   Otherwise, review of systems are positive for {NONE DEFAULTED:18576}.   All other systems are reviewed and negative.    PHYSICAL EXAM: VS:  There were no vitals taken for this visit. , BMI There is no height or weight on file to calculate BMI. GENERAL:  Well appearing HEENT:  Pupils equal round and reactive, fundi not  visualized, oral mucosa unremarkable NECK:  No jugular venous distention, waveform within normal limits, carotid upstroke brisk and symmetric, no bruits, no thyromegaly LYMPHATICS:  No cervical, inguinal adenopathy LUNGS:  Clear to auscultation bilaterally BACK:  No CVA tenderness CHEST:  Unremarkable HEART:  PMI not displaced or sustained,S1 and S2 within normal limits, no S3, no S4, no clicks, no rubs, *** murmurs ABD:  Flat, positive bowel sounds normal in frequency in pitch, no bruits, no rebound, no guarding, no midline pulsatile mass, no hepatomegaly, no splenomegaly EXT:  2 plus pulses throughout, no edema, no cyanosis no clubbing SKIN:  No rashes no nodules NEURO:  Cranial nerves II through XII grossly intact, motor  grossly intact throughout PSYCH:  Cognitively intact, oriented to person place and time    EKG:  EKG {ACTION; IS/IS INO:[id number]} ordered today. The ekg ordered today demonstrates ***   Recent Labs: 10/13/2021: BUN 17; Creatinine, Ser 1.04; Hemoglobin 16.3; Platelets 164.0; Potassium 3.8; Sodium 139; TSH 2.21    Lipid Panel    Component Value Date/Time   CHOL 127 03/16/2021 1336   TRIG 116.0 03/16/2021 1336   HDL 35.80 (L) 03/16/2021 1336   CHOLHDL 4 03/16/2021 1336   VLDL 23.2 03/16/2021 1336   LDLCALC 68 03/16/2021 1336   LDLCALC 85 09/04/2019 1435      Wt Readings from Last 3 Encounters:  10/13/21 197 lb (89.4 kg)  10/12/21 199 lb (90.3 kg)  09/20/21 199 lb (90.3 kg)      Other studies Reviewed: Additional studies/ records that were reviewed today include: ***. Review of the above records demonstrates:  Please see elsewhere in the note.  ***   ASSESSMENT AND PLAN:  Atrial fib:  ***   Current medicines are reviewed at length with the patient today.  The patient {ACTIONS; HAS/DOES NOT HAVE:19233} concerns regarding medicines.  The following changes have been made:  {PLAN; NO CHANGE:13088:s}  Labs/ tests ordered today include: *** No orders of the defined types were placed in this encounter.    Disposition:   FU with ***    Signed, Minus Breeding, MD  11/10/2021 8:47 PM    [place]

## 2021-11-11 ENCOUNTER — Ambulatory Visit (INDEPENDENT_AMBULATORY_CARE_PROVIDER_SITE_OTHER): Payer: Medicare PPO

## 2021-11-11 ENCOUNTER — Ambulatory Visit: Payer: Medicare PPO | Attending: Cardiology | Admitting: Cardiology

## 2021-11-11 ENCOUNTER — Encounter: Payer: Self-pay | Admitting: Cardiology

## 2021-11-11 VITALS — BP 102/66 | HR 59 | Ht 70.0 in | Wt 198.8 lb

## 2021-11-11 DIAGNOSIS — I4891 Unspecified atrial fibrillation: Secondary | ICD-10-CM

## 2021-11-11 NOTE — Patient Instructions (Signed)
Testing/Procedures:  Your physician has requested that you have an echocardiogram. Echocardiography is a painless test that uses sound waves to create images of your heart. It provides your doctor with information about the size and shape of your heart and how well your heart's chambers and valves are working. This procedure takes approximately one hour. There are no restrictions for this procedure. [place] Instructions  Your physician has requested you wear a ZIO patch monitor for 3 days.  This is a single patch monitor. [employer] supplies one patch monitor per enrollment. Additional stickers are not available. Please do not apply patch if you will be having a Nuclear Stress Test,  Echocardiogram, Cardiac CT, MRI, or Chest Xray during the period you would be wearing the  monitor. The patch cannot be worn during these tests. You cannot remove and re-apply the  ZIO XT patch monitor.  Your ZIO patch monitor will be mailed 3 day USPS to your address on file. It may take 3-5 days  to receive your monitor after you have been enrolled.  Once you have received your monitor, please review the enclosed instructions. Your monitor  has already been registered assigning a specific monitor serial # to you.  Billing and Patient Assistance Program Information  We have supplied [employer] with any of your insurance information on file for billing purposes. [employer] offers a sliding scale Patient Assistance Program for patients that do not have  insurance, or whose insurance does not completely cover the cost of the ZIO monitor.  You must apply for the Patient Assistance Program to qualify for this discounted rate.  To apply, please call [employer] at [phone], select option 4, select option 2, ask to apply for  Patient Assistance Program. Theodore Demark will ask your household income, and how many people  are in your household. They will quote your out-of-pocket cost  based on that information.  [employer] will also be able to set up a 7-month interest-free payment plan if needed.  Applying the monitor   Shave hair from upper left chest.  Hold abrader disc by orange tab. Rub abrader in 40 strokes over the upper left chest as  indicated in your monitor instructions.  Clean area with 4 enclosed alcohol pads. Let dry.  Apply patch as indicated in monitor instructions. Patch will be placed under collarbone on left  side of chest with arrow pointing upward.  Rub patch adhesive wings for 2 minutes. Remove white label marked "1". Remove the white  label marked "2". Rub patch adhesive wings for 2 additional minutes.  While looking in a mirror, press and release button in center of patch. A small green light will  flash 3-4 times. This will be your only indicator that the monitor has been turned on.  Do not shower for the first 24 hours. You may shower after the first 24 hours.  Press the button if you feel a symptom. You will hear a small click. Record Date, Time and  Symptom in the Patient Logbook.  When you are ready to remove the patch, follow instructions on the last 2 pages of Patient  Logbook. Stick patch monitor onto the last page of Patient Logbook.  Place Patient Logbook in the blue and white box. Use locking tab on box and tape box closed  securely. The blue and white box has prepaid postage on it. Please place it in the mailbox as  soon as possible. Your physician should have  your test results approximately 7 days after the  monitor has been mailed back to [facility].  Call [place] at [phone] if you have questions regarding  your ZIO XT patch monitor. Call them immediately if you see an orange light blinking on your  monitor.  If your monitor falls off in less than 4 days, contact our Monitor department at [phone].  If your monitor becomes loose or falls off after 4 days call [employer] at [phone] for   suggestions on securing your monitor    Follow-Up: At [facility], you and your health needs are our priority.  As part of our continuing mission to provide you with exceptional heart care, we have created designated Provider Care Teams.  These Care Teams include your primary Cardiologist (physician) and Advanced Practice Providers (APPs -  Physician Assistants and Nurse Practitioners) who all work together to provide you with the care you need, when you need it.  We recommend signing up for the patient portal called "MyChart".  Sign up information is provided on this After Visit Summary.  MyChart is used to connect with patients for Virtual Visits (Telemedicine).  Patients are able to view lab/test results, encounter notes, upcoming appointments, etc.  Non-urgent messages can be sent to your provider as well.   To learn more about what you can do with MyChart, go to [email or web address].    Your next appointment:   1 month(s)  The format for your next appointment:   In Person  Provider:   Minus Breeding MD

## 2021-11-11 NOTE — Progress Notes (Unsigned)
Enrolled for [employer] to mail a ZIO XT long term holter monitor to the patients address on file.  

## 2021-11-15 ENCOUNTER — Other Ambulatory Visit: Payer: Self-pay | Admitting: Internal Medicine

## 2021-11-15 ENCOUNTER — Ambulatory Visit: Payer: Medicare PPO

## 2021-11-15 ENCOUNTER — Ambulatory Visit: Payer: Medicare PPO | Admitting: Occupational Therapy

## 2021-11-15 DIAGNOSIS — M6281 Muscle weakness (generalized): Secondary | ICD-10-CM

## 2021-11-15 DIAGNOSIS — M79645 Pain in left finger(s): Secondary | ICD-10-CM | POA: Diagnosis not present

## 2021-11-15 DIAGNOSIS — R29898 Other symptoms and signs involving the musculoskeletal system: Secondary | ICD-10-CM

## 2021-11-15 DIAGNOSIS — M25642 Stiffness of left hand, not elsewhere classified: Secondary | ICD-10-CM

## 2021-11-15 DIAGNOSIS — E785 Hyperlipidemia, unspecified: Secondary | ICD-10-CM

## 2021-11-15 NOTE — Therapy (Signed)
OUTPATIENT OCCUPATIONAL THERAPY ORTHO EVALUATION  Patient Name: Keith [name] MRN: [id number] DOB:1940/12/19, 81 y.o., male Today's Date: 11/15/2021  PCP: Janith Lima, MD REFERRING PROVIDER: Glennon Mac, DO   OT End of Session - 11/15/21 1606     Visit Number 1    Number of Visits 7    Date for OT Re-Evaluation 12/31/21    Authorization Type [employer]    OT Start Time 1520    OT Stop Time 1600    OT Time Calculation (min) 40 min    Behavior During Therapy WFL for tasks assessed/performed            Past Medical History:  Diagnosis Date   Allergic rhinitis    Cancer ([place])    SKIN CANCER ON LIP (30 YR AGO)   DDD (degenerative disc disease), cervical    Hyperlipidemia    Hypothyroidism    PONV (postoperative nausea and vomiting)    2016 BACK SURGERY, IN RECOVERY HE HAD N/V   Past Surgical History:  Procedure Laterality Date   AMPUTATED Right 1961   TRAUMATIC AMPUTATED FINGERS RIGHT    BACK SURGERY  2016   TONSILLECTOMY     TRANSFORAMINAL LUMBAR INTERBODY FUSION (TLIF) WITH PEDICLE SCREW FIXATION 1 LEVEL Right 11/29/2017   Procedure: RIGHT SIDED LUMBAR 4-5 TRANSFORAMINAL LUMBAR INTERBODY FUSION WITH INSTRUMENTATION AND ALLOGRAFT;  Surgeon: Phylliss Bob, MD;  Location: [facility];  Service: Orthopedics;  Laterality: Right;   Patient Active Problem List   Diagnosis Date Noted   Tachycardia, unspecified 10/13/2021   Atrial fibrillation with RVR (HCC) 10/13/2021   Chronic hyperglycemia 03/16/2021   B12 nutritional deficiency 03/16/2021   Vitamin B12 deficiency anemia due to intrinsic factor deficiency 03/16/2021   Olecranon bursitis of left elbow 09/10/2020   Lumbar radiculopathy, right 09/18/2019   AC (acromioclavicular) arthritis 10/11/2018   Routine general medical examination at a health care facility 12/07/2016   Senile purpura ([place]) 01/14/2015   Arthritis of sacroiliac joint 08/27/2014   Renal cyst 05/11/2014   CMC arthritis, thumb, degenerative  07/24/2013   Prostate nodule 12/15/2011   Osteoarthritis of hip 10/06/2009   Hypothyroidism 10/15/2008   Hyperlipidemia with target LDL less than 130 03/03/2008   Allergic rhinitis 03/03/2008    ONSET DATE: 08/09/21  REFERRING DIAG: J85.631S (ICD-10-CM) - Closed displaced fracture of shaft of fifth metacarpal bone of left hand with routine healing, subsequent encounter S62.325D (ICD-10-CM) - Closed displaced fracture of shaft of fourth metacarpal bone of left hand with routine healing, subsequent encounter   THERAPY DIAG:  Stiffness of left hand, not elsewhere classified  Muscle weakness (generalized)  Other symptoms and signs involving the musculoskeletal system  Rationale for Evaluation and Treatment Rehabilitation  SUBJECTIVE:   SUBJECTIVE STATEMENT: Pt arrives to OP OT evaluation w/ primary concern related to decreased range of motion/stiffness of his L hand. States he fractured it back in July and was in a splint for 8 weeks. Started PT a few weeks ago, who then recommended him to OT instead. Pt reports he is very impatient in regards to his healing and is the primary caregiver for his wife with dementia, so he cannot attend therapy very frequently. Pt accompanied by: self  PERTINENT HISTORY: L hand fracture of 4th and 5th MCP shaft on 08/09/21; Per x-ray on 08/10/21: Oblique moderately displaced, mildly impacted fracture of the shaft of the little finger metacarpal. No significant angulation. Oblique mildly displaced, mildly impacted fracture of the shaft of the ring finger metacarpal. No  significant angulation. Soft tissue swelling at the ulnar and dorsal aspect of the hand.  PRECAUTIONS: Fall  WEIGHT BEARING RESTRICTIONS No  PAIN: Are you having pain? No  FALLS: Has patient fallen in last 6 months? Yes. Number of falls 1  LIVING ENVIRONMENT: Lives with:  wife who has severe NCD; son and son's girlfriend who are able to help w/ caregiving Lives in:  House/apartment Stairs:  single-level home; 2 steps to enter w/ 1 rail Has following equipment at home: Environmental consultant - 2 wheeled  PLOF: Independent and Vocation/Vocational requirements: retired  PATIENT GOALS: get better as quickly as possible   OBJECTIVE:   HAND DOMINANCE: Right  ADLs: Overall ADLs: Mod Ind w/ BADLs  FUNCTIONAL OUTCOME MEASURES: Quick Dash: 11.4/100 Moderate difficulty: opening a tight/new jar Mild difficulty: washing back, slight interference w/ normal social activities, mild hand pain in the past week  UPPER EXTREMITY ROM     Active ROM Left Eval - 10/9  Thumb MCP (0-60)   Thumb IP (0-80)   Thumb Radial abd/add (0-55)    Thumb Palmar abd/add (0-45)    Thumb Opposition to Small Finger    Index MCP (0-90) 68  Index PIP (0-100) 90  Index DIP (0-70)    Long MCP (0-90)  55  Long PIP (0-100)  86  Long DIP (0-70)    Ring MCP (0-90)  49  Ring PIP (0-100)  90  Ring DIP (0-70)    Little MCP (0-90)  2  Little PIP (0-100)  79  Little DIP (0-70)    (Blank rows = not tested)  HAND FUNCTION: Grip strength: Right: 62 lbs; Left: 24 lbs  COORDINATION: WFL  SENSATION: WFL, per pt report  EDEMA: no observable edema  OBSERVATIONS: Some difficulty observed w/ sequencing of exercises; benefited from repetition   TODAY'S TREATMENT:  Therapeutic Exercise Tendon gliding (open hand, hook fist, full fist, MPJ flexion, straight/flat fist) x10 for L hand; OT providing verbal cues and demonstration to facilitate understanding, gradation, and pacing  EDC gliding (full fist to hook fist) completed x15 w/ L hand; OT providing verbal cues and demonstration throughout for understanding  AROM of MPJ flexion/extension completed x15 w/ L hand. Pt required physical assist for repositioning of MPJ flexion w/ IPJs extended; carryover improved w/ repetition and OT provided demonstration prn     PATIENT EDUCATION: Educated on role and purpose of OT as well as potential  interventions and goals for therapy based on initial evaluation findings. Provided condition-specific education related to healing and anticipated outcomes of therapy. Person educated: Patient Education method: Explanation Education comprehension: verbalized understanding   HOME EXERCISE PROGRAM: EDC glide PIPJ stretch MCPJ flex/extension (Printed handouts provided)  GOALS: Goals reviewed with patient? No  SHORT TERM GOALS: Target date: 12/06/2021      Status:  1 Pt will demonstrate independence w/ HEP designed for AROM/tendon gliding, strength, and stretching to prevent further deformity and maximize functional use of LUE during ADLs Baseline: to be administered Initial  2 Pt will report being able to open various containers and/or tight/new jars w/ mild difficulty or better, incorporating compensatory strategies or AE prn Baseline: moderate difficulty, per QuickDASH questionnaire Initial    LONG TERM GOALS: Target date: 12/27/2021      Status:  1 Pt will improve grip strength in L hand to at least 35 lbs w/out pain for functional use at home and during IADLs  Baseline: Right: 62 lbs; Left: 24 lbs Initial  2 Pt will increase L  ring finger PIPJ extension by at least 5 degrees for improved functional use of L hand and to prevent further deformity Baseline: about 15 deg from neutral Initial  3 Pt will increase L little finger MCPJ flexion to at least 25 degrees to improve functional grasp/grip Baseline: 2 degrees of MCPJ flexion Initial    ASSESSMENT:  CLINICAL IMPRESSION: Pt is an 81 y/o who presents to OP OT 14 weeks s/p L hand 4th and 5th finger metacarpal shaft fracture on 08/09/21. Pt has been treated non-surgically and was placed in an ulnar gutter splint before being transitioned to an Exos boxer's fx brace to protect and facilitate healing for several weeks. Evaluation indicated decreased ROM of both MPJ flex/ext of ring and little fingers and PIP extension of ring finger, as  well as decreased grip strength. Pt is also the primary caregiver for his wife who has NCD and reports this will limit his ability to attend therapy multiple times/week or for an extended period of time. Pt will benefit from skilled occupational therapy services to address strength and coordination, ROM, pain management, and implementation of an HEP to improve participation and safety during ADLs and ensure maximal functional use of LUE.  PERFORMANCE DEFICITS in functional skills including ROM, strength, pain, body mechanics, and UE functional use.  IMPAIRMENTS are limiting patient from IADLs.   COMORBIDITIES may have co-morbidities  that affects occupational performance. Patient will benefit from skilled OT to address above impairments and improve overall function.  MODIFICATION OR ASSISTANCE TO COMPLETE EVALUATION: No modification of tasks or assist necessary to complete an evaluation.  OT OCCUPATIONAL PROFILE AND HISTORY: Problem focused assessment: Including review of records relating to presenting problem.  CLINICAL DECISION MAKING: LOW - limited treatment options, no task modification necessary  REHAB POTENTIAL: Fair due to time since onset and prolonged immobilization  EVALUATION COMPLEXITY: Low   PLAN: OT FREQUENCY: 1x/week (decreased frequency due to pt scheduling/request)  OT DURATION: 6 weeks  PLANNED INTERVENTIONS: self care/ADL training, therapeutic exercise, therapeutic activity, manual therapy, passive range of motion, splinting, paraffin, fluidotherapy, moist heat, cryotherapy, patient/family education, and DME and/or AE instructions  RECOMMENDED OTHER SERVICES: None at this time  CONSULTED AND AGREED WITH PLAN OF CARE: Patient  PLAN FOR NEXT SESSION: Review exercises administered this session; soft tissue massage and stretching; trial oval-8 orthosis for L ring PIPJ extension   Kathrine Cords, MSOT, OTR/L 11/15/2021, 4:08 PM

## 2021-11-17 ENCOUNTER — Ambulatory Visit: Payer: Medicare PPO

## 2021-11-21 DIAGNOSIS — I4891 Unspecified atrial fibrillation: Secondary | ICD-10-CM

## 2021-11-22 ENCOUNTER — Ambulatory Visit: Payer: Medicare PPO

## 2021-11-22 ENCOUNTER — Ambulatory Visit: Payer: Medicare PPO | Admitting: Occupational Therapy

## 2021-11-22 DIAGNOSIS — M6281 Muscle weakness (generalized): Secondary | ICD-10-CM

## 2021-11-22 DIAGNOSIS — M25642 Stiffness of left hand, not elsewhere classified: Secondary | ICD-10-CM | POA: Diagnosis not present

## 2021-11-22 DIAGNOSIS — R29898 Other symptoms and signs involving the musculoskeletal system: Secondary | ICD-10-CM

## 2021-11-22 DIAGNOSIS — M79645 Pain in left finger(s): Secondary | ICD-10-CM

## 2021-11-22 NOTE — Therapy (Signed)
OUTPATIENT OCCUPATIONAL THERAPY TREATMENT NOTE  Patient Name: Keith [name] MRN: [id number] DOB:[birth year], 81 y.o., male Today's Date: 11/22/2021  PCP: Janith Lima, MD REFERRING PROVIDER: Glennon Mac, DO   OT End of Session - 11/22/21 1628     Visit Number 2    Number of Visits 7    Date for OT Re-Evaluation 12/31/21    Authorization Type [employer]    OT Start Time 9024    OT Stop Time 1619    OT Time Calculation (min) 48 min    Activity Tolerance Patient tolerated treatment well    Behavior During Therapy WFL for tasks assessed/performed            Past Medical History:  Diagnosis Date   Allergic rhinitis    Cancer ([place])    SKIN CANCER ON LIP (30 YR AGO)   DDD (degenerative disc disease), cervical    Hyperlipidemia    Hypothyroidism    PONV (postoperative nausea and vomiting)    2016 BACK SURGERY, IN RECOVERY HE HAD N/V   Past Surgical History:  Procedure Laterality Date   AMPUTATED Right 1961   TRAUMATIC AMPUTATED FINGERS RIGHT    BACK SURGERY  2016   TONSILLECTOMY     TRANSFORAMINAL LUMBAR INTERBODY FUSION (TLIF) WITH PEDICLE SCREW FIXATION 1 LEVEL Right 11/29/2017   Procedure: RIGHT SIDED LUMBAR 4-5 TRANSFORAMINAL LUMBAR INTERBODY FUSION WITH INSTRUMENTATION AND ALLOGRAFT;  Surgeon: Phylliss Bob, MD;  Location: [facility];  Service: Orthopedics;  Laterality: Right;   Patient Active Problem List   Diagnosis Date Noted   Tachycardia, unspecified 10/13/2021   Atrial fibrillation with RVR (HCC) 10/13/2021   Chronic hyperglycemia 03/16/2021   B12 nutritional deficiency 03/16/2021   Vitamin B12 deficiency anemia due to intrinsic factor deficiency 03/16/2021   Olecranon bursitis of left elbow 09/10/2020   Lumbar radiculopathy, right 09/18/2019   AC (acromioclavicular) arthritis 10/11/2018   Routine general medical examination at a health care facility 12/07/2016   Senile purpura ([place]) 01/14/2015   Arthritis of sacroiliac joint 08/27/2014    Renal cyst 05/11/2014   CMC arthritis, thumb, degenerative 07/24/2013   Prostate nodule 12/15/2011   Osteoarthritis of hip 10/06/2009   Hypothyroidism 10/15/2008   Hyperlipidemia with target LDL less than 130 03/03/2008   Allergic rhinitis 03/03/2008    ONSET DATE: DOI: 08/09/21  REFERRING DIAG: O97.353G (ICD-10-CM) - Closed displaced fracture of shaft of fifth metacarpal bone of left hand with routine healing, subsequent encounter S62.325D (ICD-10-CM) - Closed displaced fracture of shaft of fourth metacarpal bone of left hand with routine healing, subsequent encounter   THERAPY DIAG:  Stiffness of left hand, not elsewhere classified  Muscle weakness (generalized)  Other symptoms and signs involving the musculoskeletal system  Pain in left finger(s)  Rationale for Evaluation and Treatment Rehabilitation  SUBJECTIVE:   SUBJECTIVE STATEMENT: Pt reports he has been doing his exercises at home Pt accompanied by: self  PERTINENT HISTORY: L hand fracture of 4th and 5th MCP shaft on 08/09/21; Per x-ray on 08/10/21: Oblique moderately displaced, mildly impacted fracture of the shaft of the little finger metacarpal. No significant angulation. Oblique mildly displaced, mildly impacted fracture of the shaft of the ring finger metacarpal. No significant angulation. Soft tissue swelling at the ulnar and dorsal aspect of the hand.  PRECAUTIONS: Fall  PAIN: Are you having pain? No  LIVING ENVIRONMENT: Lives with:  wife who has severe NCD; son and son's girlfriend are able to help w/ caregiving  PLOF: Independent and Vocation/Vocational  requirements: retired  PATIENT GOALS: get better as quickly as possible   OBJECTIVE:   HAND DOMINANCE: Right  FUNCTIONAL OUTCOME MEASURES: Quick Dash: 11.4/100 Moderate difficulty: opening a tight/new jar Mild difficulty: washing back, slight interference w/ social activities, mild hand pain  UPPER EXTREMITY ROM     Active ROM Left Eval - 10/9   Thumb MCP (0-60)   Thumb IP (0-80)   Thumb Radial abd/add (0-55)    Thumb Palmar abd/add (0-45)    Thumb Opposition to Small Finger    Index MCP (0-90) 68  Index PIP (0-100) 90  Index DIP (0-70)    Long MCP (0-90)  55  Long PIP (0-100)  86  Long DIP (0-70)    Ring MCP (0-90)  49  Ring PIP (0-100)  90  Ring DIP (0-70)    Little MCP (0-90)  2  Little PIP (0-100)  79  Little DIP (0-70)    (Blank rows = not tested)  HAND FUNCTION: Grip strength: Right: 62 lbs; Left: 24 lbs  ------------------------------------------------------------------------------------------------------------------------------------------------------ MEASUREMENTS ABOVE TAKEN AT TIME OF EVALUATION UNLESS DESIGNATED OTHERWISE  TODAY'S TREATMENT:  Therapeutic Exercise Place and hold of whole hand MPJ flex x10; success inconsistent, particularly w/ maintaining 5th finger MCPJ flexion  AROM of L MPJ flex/ext completed x15; pt required assist for repositioning of MPJ flexion w/ IPJs extended; technique improved w/ repetition and OT provided demonstration prn  AROM of isolated L finger flex/ext w/ blocking of other digits 15x each finger  AROM of isolated L PIPJ flex/ext w/ blocking of other joints 15x each finger  OT-facilitated PROM of L hand MCPJ flexion of 4th and 5th digits, providing support of arches of hand during movement; pt able to tolerate w/ no pain  Gross grasp w/ soft, yellow putty, focusing on slow movement against resistance to allow for conforming opposed to straining to force grasp   Thumb-to-finger opp 5x along length of rolled putty w/ L ring and little fingers  Attempted hook fist and MPJ flexion w/ putty w/out success due to limitations w/ AROM  Self-Care/ADLs Education provided on benefit of oval-8 orthosis for PIPJ flexion contracture of L hand ring finger; pt sized and reported comfortable fit of sample orthosis, but declined further information on options for self-purchase  Education  provided on potentially beneficial compensatory strategies for opening jars/bottles, including AE prn; practiced w/ non-slip pad w/ pt able to return demonstration w/ no difficulty     PATIENT EDUCATION: See tx section above; ongoing condition-specific education Person educated: Patient Education method: Explanation Education comprehension: verbalized understanding   HOME EXERCISE PROGRAM: EDC glide PIPJ stretch MCPJ flex/extension (Printed handouts provided)  MedBridge Access Code: [email or web address] URL: [email or web address] - Hand PROM MCP Flexion  - 2 x daily - 10 reps - 5-10 sec hold - Composite Flexion with Putty  - 2 x daily - 1 sets - 25 reps - Thumb Opposition with Putty  - 2 x daily - 3 sets - 10 reps  GOALS: Goals reviewed with patient? Yes  SHORT TERM GOALS: Target date: 12/06/2021      Status:  1 Pt will demonstrate independence w/ HEP designed for AROM/tendon gliding, strength, and stretching to prevent further deformity and maximize functional use of LUE during ADLs Baseline: to be administered Progressing  2 Pt will report being able to open various containers and/or tight/new jars w/ mild difficulty or better, incorporating compensatory strategies or AE prn Baseline: moderate difficulty, per QuickDASH questionnaire Progressing    LONG TERM  GOALS: Target date: 12/27/2021      Status:  1 Pt will improve grip strength in L hand to at least 35 lbs w/out pain for functional use at home and during IADLs  Baseline: Right: 62 lbs; Left: 24 lbs Progressing  2 Pt will increase L ring finger PIPJ extension by at least 5 degrees for improved functional use of L hand and to prevent further deformity Baseline: about 15 deg from neutral Progressing  3 Pt will increase L little finger MCPJ flexion to at least 25 degrees to improve functional grasp/grip Baseline: 2 degrees of MCPJ flexion Progressing    ASSESSMENT:  CLINICAL IMPRESSION: Pt arrives for first  treatment session following initial evaluation on 11/15/21. OT reviewed goals w/ pt who is agreeable to plan of care at this time, and reviewed exercises included in initial HEP to ensure technique. OT then focused session on MCPJ flexion of L hand 4th and 5th digits, attempting isolated AROM and place-and-hold exercises, isolated finger flex/ext for tendon excursion, and light hand strengthening w/ therapy putty. Pt able to return demonstration of exercises w/ no incr pain. Will continue to progress w/ ROM and strengthening, as well as introduce potentially beneficial compensatory strategies, including AE prn.  PERFORMANCE DEFICITS in functional skills including ROM, strength, pain, body mechanics, and UE functional use.  IMPAIRMENTS are limiting patient from IADLs.   COMORBIDITIES may have co-morbidities  that affects occupational performance. Patient will benefit from skilled OT to address above impairments and improve overall function.   PLAN: OT FREQUENCY: 1x/week (decreased frequency due to pt scheduling/request)  OT DURATION: 6 weeks  PLANNED INTERVENTIONS: self care/ADL training, therapeutic exercise, therapeutic activity, manual therapy, passive range of motion, splinting, paraffin, fluidotherapy, moist heat, cryotherapy, patient/family education, and DME and/or AE instructions  RECOMMENDED OTHER SERVICES: None at this time  CONSULTED AND AGREED WITH PLAN OF CARE: Patient  PLAN FOR NEXT SESSION: Review exercises administered this session; soft tissue massage and stretching   Kathrine Cords, MSOT, OTR/L 11/22/2021, 4:28 PM

## 2021-11-24 ENCOUNTER — Ambulatory Visit: Payer: Medicare PPO

## 2021-11-25 ENCOUNTER — Ambulatory Visit (HOSPITAL_COMMUNITY): Payer: Medicare PPO | Attending: Cardiology

## 2021-11-25 DIAGNOSIS — I4891 Unspecified atrial fibrillation: Secondary | ICD-10-CM | POA: Insufficient documentation

## 2021-11-25 LAB — ECHOCARDIOGRAM COMPLETE
Area-P 1/2: 3.29 cm2
P 1/2 time: 359 msec
S' Lateral: 2.9 cm

## 2021-11-29 ENCOUNTER — Ambulatory Visit: Payer: Medicare PPO

## 2021-11-29 ENCOUNTER — Ambulatory Visit: Payer: Medicare PPO | Admitting: Occupational Therapy

## 2021-11-29 ENCOUNTER — Encounter: Payer: Self-pay | Admitting: Occupational Therapy

## 2021-11-29 DIAGNOSIS — M79645 Pain in left finger(s): Secondary | ICD-10-CM

## 2021-11-29 DIAGNOSIS — R29898 Other symptoms and signs involving the musculoskeletal system: Secondary | ICD-10-CM | POA: Diagnosis not present

## 2021-11-29 DIAGNOSIS — M25642 Stiffness of left hand, not elsewhere classified: Secondary | ICD-10-CM | POA: Diagnosis not present

## 2021-11-29 DIAGNOSIS — M6281 Muscle weakness (generalized): Secondary | ICD-10-CM

## 2021-11-29 NOTE — Therapy (Signed)
OUTPATIENT OCCUPATIONAL THERAPY TREATMENT NOTE  Patient Name: Keith [name] MRN: [id number] DOB:20-Feb-1940, 81 y.o., male Today's Date: 11/29/2021  PCP: Janith Lima, MD REFERRING PROVIDER: Glennon Mac, DO   OT End of Session - 11/29/21 1728     Visit Number 3    Number of Visits 7    Date for OT Re-Evaluation 12/31/21    Authorization Type [employer]    OT Start Time 3299    OT Stop Time 1617    OT Time Calculation (min) 46 min    Activity Tolerance Patient tolerated treatment well    Behavior During Therapy WFL for tasks assessed/performed            Past Medical History:  Diagnosis Date   Allergic rhinitis    Cancer ([place])    SKIN CANCER ON LIP (30 YR AGO)   DDD (degenerative disc disease), cervical    Hyperlipidemia    Hypothyroidism    PONV (postoperative nausea and vomiting)    2016 BACK SURGERY, IN RECOVERY HE HAD N/V   Past Surgical History:  Procedure Laterality Date   AMPUTATED Right 1961   TRAUMATIC AMPUTATED FINGERS RIGHT    BACK SURGERY  2016   TONSILLECTOMY     TRANSFORAMINAL LUMBAR INTERBODY FUSION (TLIF) WITH PEDICLE SCREW FIXATION 1 LEVEL Right 11/29/2017   Procedure: RIGHT SIDED LUMBAR 4-5 TRANSFORAMINAL LUMBAR INTERBODY FUSION WITH INSTRUMENTATION AND ALLOGRAFT;  Surgeon: Phylliss Bob, MD;  Location: [facility];  Service: Orthopedics;  Laterality: Right;   Patient Active Problem List   Diagnosis Date Noted   Tachycardia, unspecified 10/13/2021   Atrial fibrillation with RVR (HCC) 10/13/2021   Chronic hyperglycemia 03/16/2021   B12 nutritional deficiency 03/16/2021   Vitamin B12 deficiency anemia due to intrinsic factor deficiency 03/16/2021   Olecranon bursitis of left elbow 09/10/2020   Lumbar radiculopathy, right 09/18/2019   AC (acromioclavicular) arthritis 10/11/2018   Routine general medical examination at a health care facility 12/07/2016   Senile purpura ([place]) 01/14/2015   Arthritis of sacroiliac joint 08/27/2014    Renal cyst 05/11/2014   CMC arthritis, thumb, degenerative 07/24/2013   Prostate nodule 12/15/2011   Osteoarthritis of hip 10/06/2009   Hypothyroidism 10/15/2008   Hyperlipidemia with target LDL less than 130 03/03/2008   Allergic rhinitis 03/03/2008    ONSET DATE: DOI: 08/09/21  REFERRING DIAG: M42.683M (ICD-10-CM) - Closed displaced fracture of shaft of fifth metacarpal bone of left hand with routine healing, subsequent encounter S62.325D (ICD-10-CM) - Closed displaced fracture of shaft of fourth metacarpal bone of left hand with routine healing, subsequent encounter   THERAPY DIAG:  Stiffness of left hand, not elsewhere classified  Muscle weakness (generalized)  Other symptoms and signs involving the musculoskeletal system  Pain in left finger(s)  Rationale for Evaluation and Treatment Rehabilitation  SUBJECTIVE:   SUBJECTIVE STATEMENT: Pt reports continued stiffness and wonders if this could be arthritis-type pain Pt accompanied by: self  PERTINENT HISTORY: L hand fracture of 4th and 5th MCP shaft on 08/09/21; Per x-ray on 08/10/21: Oblique moderately displaced, mildly impacted fracture of the shaft of the little finger metacarpal. No significant angulation. Oblique mildly displaced, mildly impacted fracture of the shaft of the ring finger metacarpal. No significant angulation. Soft tissue swelling at the ulnar and dorsal aspect of the hand.  PRECAUTIONS: Fall  PAIN: Are you having pain? No  LIVING ENVIRONMENT: Lives with wife who has severe NCD; son and son's girlfriend are able to help w/ caregiving  PLOF: Independent and  Vocation/Vocational requirements: retired  PATIENT GOALS: "get better as quickly as possible"   OBJECTIVE:   HAND DOMINANCE: Right  FUNCTIONAL OUTCOME MEASURES: Quick Dash: 11.4/100 Moderate difficulty: opening a tight/new jar Mild difficulty: washing back, slight interference w/ social activities, mild hand pain  UPPER EXTREMITY ROM      Active ROM Left Eval - 10/9 Left 10/23  Thumb MCP (0-60)    Thumb IP (0-80)    Thumb Radial abd/add (0-55)     Thumb Palmar abd/add (0-45)     Thumb Opposition to Small Finger     Index MCP (0-90) 68 67  Index PIP (0-100) 90 104  Index DIP (0-70)     Long MCP (0-90)  55 65  Long PIP (0-100)  86 102  Long DIP (0-70)     Ring MCP (0-90)  49 68  Ring PIP (0-100)  90 97  Ring DIP (0-70)     Little MCP (0-90)  2 75  Little PIP (0-100)  79 101  Little DIP (0-70)     (Blank rows = not tested)   ------------------------------------------------------------------------------------------------------------------------------------------------------ MEASUREMENTS ABOVE TAKEN AT TIME OF EVALUATION UNLESS DESIGNATED OTHERWISE  TODAY'S TREATMENT:  11/29/21 Manual Therapy Gentle soft tissue mobilization to L hand in order to facilitate mobility within tissue structures and decrease tension, stiffness, and pain; pt tolerated intervention w/out discomfort  Therapeutic Exercise AROM of L hand finger abduct/adduct 2x15 for intrinsic hand strengthening; OT providing verbal cues for consistency w/ arc of motion prn  AROM of isolated L ring finger PIPJ flex/ext and PIPJ flex/ext w/ OT blocking 15x; OT facilitated 2nd set w/ self-blocking, providing verbal cues, demo, and assist for understanding of positioning prn  AROM and PROM of MPJ flex/ext for L little finger 10x w/ blocking of other digits; completed 2 sets w/ self-blocking during 2nd set  Place and hold of whole hand MPJ flex x10 followed by AROM of MPJ flex 10x; success w/ maintaining 5th finger flexion inconsistent but improved compared to prior visit  EDC glide exercise completed 15x w/ OT providing verbal cues and demonstration prn to facilitate little finger alignment consistently     PATIENT EDUCATION: See tx section above; ongoing condition-specific education Person educated: Patient Education method: Explanation Education  comprehension: verbalized understanding   HOME EXERCISE PROGRAM: EDC glide PIPJ stretch MCPJ flex/extension (Printed handouts provided)  MedBridge Access Code: [email or web address] URL: [email or web address] - Hand PROM MCP Flexion  - 2 x daily - 10 reps - 5-10 sec hold - Composite Flexion with Putty  - 2 x daily - 1 sets - 25 reps - Thumb Opposition with Putty  - 2 x daily - 3 sets - 10 reps  GOALS: Goals reviewed with patient? Yes  SHORT TERM GOALS: Target date: 12/06/2021      Status:  1 Pt will demonstrate independence w/ HEP designed for AROM/tendon gliding, strength, and stretching to prevent further deformity and maximize functional use of LUE during ADLs Baseline: to be administered Progressing  2 Pt will report being able to open various containers and/or tight/new jars w/ mild difficulty or better, incorporating compensatory strategies or AE prn Baseline: moderate difficulty, per QuickDASH questionnaire Met - 11/29/21    LONG TERM GOALS: Target date: 12/27/2021      Status:  1 Pt will improve grip strength in L hand to at least 35 lbs w/out pain for functional use at home and during IADLs  Baseline: Right: 62 lbs; Left: 24 lbs Met - 11/29/21: 39 lbs  2 Pt will increase L ring finger PIPJ extension by at least 5 degrees for improved functional use of L hand and to prevent further deformity Baseline: about 15 deg from neutral Progressing  3 Pt will increase L little finger MCPJ flexion to at least 25 degrees to improve functional grasp/grip Baseline: 2 degrees of MCPJ flexion Progressing    ASSESSMENT:  CLINICAL IMPRESSION: Pt arrives today reporting good consistency w/ HEP and demonstrating improved L hand ROM to support this. OT continued to focus on MCPJ L 4th and 5th digits and intrinsic hand strengthening, incorporating AROM and place-and-hold exercises again w/ good results and improved MPJ flexion compared to previous visit. OT also re-attempted hook fist  exercise, focusing on not hyperextending little finger MPJ extension w/ good carryover during exercise. Will continue to progress w/ L hand ROM and intrinsic hand/grip strengthening.  PERFORMANCE DEFICITS in functional skills including ROM, strength, pain, body mechanics, and UE functional use.  IMPAIRMENTS are limiting patient from IADLs.   COMORBIDITIES may have co-morbidities  that affects occupational performance. Patient will benefit from skilled OT to address above impairments and improve overall function.   PLAN: OT FREQUENCY: 1x/week (decreased frequency due to pt scheduling/request)  OT DURATION: 6 weeks  PLANNED INTERVENTIONS: self care/ADL training, therapeutic exercise, therapeutic activity, manual therapy, passive range of motion, splinting, paraffin, fluidotherapy, moist heat, cryotherapy, patient/family education, and DME and/or AE instructions  RECOMMENDED OTHER SERVICES: None at this time  CONSULTED AND AGREED WITH PLAN OF CARE: Patient  PLAN FOR NEXT SESSION: Progress w/ protocol: L hand strength, tendon excursion, and ROM   Kathrine Cords, MSOT, OTR/L 11/29/2021, 5:28 PM

## 2021-11-30 DIAGNOSIS — I4891 Unspecified atrial fibrillation: Secondary | ICD-10-CM | POA: Diagnosis not present

## 2021-12-01 ENCOUNTER — Encounter: Payer: Self-pay | Admitting: Cardiology

## 2021-12-01 ENCOUNTER — Ambulatory Visit (INDEPENDENT_AMBULATORY_CARE_PROVIDER_SITE_OTHER): Payer: Medicare PPO

## 2021-12-01 ENCOUNTER — Ambulatory Visit: Payer: Medicare PPO

## 2021-12-01 DIAGNOSIS — E538 Deficiency of other specified B group vitamins: Secondary | ICD-10-CM | POA: Diagnosis not present

## 2021-12-01 MED ORDER — CYANOCOBALAMIN 1000 MCG/ML IJ SOLN
1000.0000 ug | Freq: Once | INTRAMUSCULAR | Status: AC
  Administered 2021-12-01: 1000 ug via INTRAMUSCULAR

## 2021-12-01 NOTE — Progress Notes (Signed)
After obtaining consent, and per orders of Dr. Jones, injection of B12 given in the left deltoid by Mauricio Dahlen P Gabrial Poppell. Patient instructed to report any adverse reaction to me immediately.  

## 2021-12-05 ENCOUNTER — Other Ambulatory Visit: Payer: Self-pay | Admitting: Internal Medicine

## 2021-12-05 DIAGNOSIS — E039 Hypothyroidism, unspecified: Secondary | ICD-10-CM

## 2021-12-06 ENCOUNTER — Encounter: Payer: Self-pay | Admitting: Rehabilitative and Restorative Service Providers"

## 2021-12-06 ENCOUNTER — Ambulatory Visit: Payer: Medicare PPO | Admitting: Rehabilitative and Restorative Service Providers"

## 2021-12-06 DIAGNOSIS — M79645 Pain in left finger(s): Secondary | ICD-10-CM | POA: Diagnosis not present

## 2021-12-06 DIAGNOSIS — M25642 Stiffness of left hand, not elsewhere classified: Secondary | ICD-10-CM

## 2021-12-06 DIAGNOSIS — M6281 Muscle weakness (generalized): Secondary | ICD-10-CM

## 2021-12-06 DIAGNOSIS — R29898 Other symptoms and signs involving the musculoskeletal system: Secondary | ICD-10-CM

## 2021-12-06 NOTE — Therapy (Signed)
OUTPATIENT OCCUPATIONAL THERAPY TREATMENT & DISCHARGE NOTE  Patient Name: Keith [name] MRN: [id number] DOB:11/27/1940, 81 y.o., male Today's Date: 12/06/2021  PCP: Janith Lima, MD REFERRING PROVIDER: Glennon Mac, DO   OT End of Session - 12/06/21 1515     Visit Number 4    Number of Visits 7    Date for OT Re-Evaluation 12/31/21    Authorization Type [employer]    OT Start Time 5885    OT Stop Time 1547    OT Time Calculation (min) 32 min    Activity Tolerance Patient tolerated treatment well;No increased pain    Behavior During Therapy WFL for tasks assessed/performed             Past Medical History:  Diagnosis Date   Allergic rhinitis    Cancer ([place])    SKIN CANCER ON LIP (30 YR AGO)   DDD (degenerative disc disease), cervical    Hyperlipidemia    Hypothyroidism    PONV (postoperative nausea and vomiting)    2016 BACK SURGERY, IN RECOVERY HE HAD N/V   Past Surgical History:  Procedure Laterality Date   AMPUTATED Right 1961   TRAUMATIC AMPUTATED FINGERS RIGHT    BACK SURGERY  2016   TONSILLECTOMY     TRANSFORAMINAL LUMBAR INTERBODY FUSION (TLIF) WITH PEDICLE SCREW FIXATION 1 LEVEL Right 11/29/2017   Procedure: RIGHT SIDED LUMBAR 4-5 TRANSFORAMINAL LUMBAR INTERBODY FUSION WITH INSTRUMENTATION AND ALLOGRAFT;  Surgeon: Phylliss Bob, MD;  Location: [facility];  Service: Orthopedics;  Laterality: Right;   Patient Active Problem List   Diagnosis Date Noted   Tachycardia, unspecified 10/13/2021   Atrial fibrillation with RVR (HCC) 10/13/2021   Chronic hyperglycemia 03/16/2021   B12 nutritional deficiency 03/16/2021   Vitamin B12 deficiency anemia due to intrinsic factor deficiency 03/16/2021   Olecranon bursitis of left elbow 09/10/2020   Lumbar radiculopathy, right 09/18/2019   AC (acromioclavicular) arthritis 10/11/2018   Routine general medical examination at a health care facility 12/07/2016   Senile purpura ([place]) 01/14/2015   Arthritis of  sacroiliac joint 08/27/2014   Renal cyst 05/11/2014   CMC arthritis, thumb, degenerative 07/24/2013   Prostate nodule 12/15/2011   Osteoarthritis of hip 10/06/2009   Hypothyroidism 10/15/2008   Hyperlipidemia with target LDL less than 130 03/03/2008   Allergic rhinitis 03/03/2008    ONSET DATE: DOI: 08/09/21  REFERRING DIAG: O27.741O (ICD-10-CM) - Closed displaced fracture of shaft of fifth metacarpal bone of left hand with routine healing, subsequent encounter S62.325D (ICD-10-CM) - Closed displaced fracture of shaft of fourth metacarpal bone of left hand with routine healing, subsequent encounter   THERAPY DIAG:  Pain in left finger(s)  Stiffness of left hand, not elsewhere classified  Muscle weakness (generalized)  Other symptoms and signs involving the musculoskeletal system  Rationale for Evaluation and Treatment Rehabilitation  PERTINENT HISTORY: L hand fracture of 4th and 5th MCP shaft on 08/09/21; Per x-ray on 08/10/21: Oblique moderately displaced, mildly impacted fracture of the shaft of the little finger metacarpal. No significant angulation. Oblique mildly displaced, mildly impacted fracture of the shaft of the ring finger metacarpal. No significant angulation. Soft tissue swelling at the ulnar and dorsal aspect of the hand.  PRECAUTIONS: Fall   SUBJECTIVE:   SUBJECTIVE STATEMENT: He states no longer needing therapy, feeling strong, can make a functional fist (though not WNL). He states doing some stretches and using putty and that is helpful to him. 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    PAIN: Are you having pain? No Rating: 0/10 at rest now, occasional pains in MCP Js with over-use (OA sharp pains, likely)    PATIENT GOALS: "get better as quickly as possible"    OBJECTIVE: (All objective assessments below are from initial evaluation on: 11/15/21 unless otherwise specified.)    HAND DOMINANCE: Right  FUNCTIONAL  OUTCOME MEASURES: 12/06/21:  He states no functional issues now.   Quick Dash: 11.4/100 Moderate difficulty: opening a tight/new jar Mild difficulty: washing back, slight interference w/ social activities, mild hand pain  UPPER EXTREMITY ROM     Active ROM Left Eval - 10/9 Left 10/23  Thumb MCP (0-60)    Thumb IP (0-80)    Thumb Radial abd/add (0-55)     Thumb Palmar abd/add (0-45)     Thumb Opposition to Small Finger     Index MCP (0-90) 68 67  Index PIP (0-100) 90 104  Index DIP (0-70)     Long MCP (0-90)  55 65  Long PIP (0-100)  86 102  Long DIP (0-70)     Ring MCP (0-90)  49 68  Ring PIP (0-100)  90 97  Ring DIP (0-70)     Little MCP (0-90)  2 75  Little PIP (0-100)  79 101  Little DIP (0-70)     (Blank rows = not tested)   TODAY'S TREATMENT: 12/06/21: OT discusses home care/self-care, spouse care and he states no issues now.  OT discusses HEP and edu on new ideas to stretch thumbs safely to prevent worse deformity and pain.  OT also reviews tendon gliding and MCP J stretching.  Otherwise, he is doing well, did not require much treatment today, and will only be billed for the skilled treatment time (not entire session length- as he was just talking with OT at times).    11/29/21 Manual Therapy Gentle soft tissue mobilization to L hand in order to facilitate mobility within tissue structures and decrease tension, stiffness, and pain; pt tolerated intervention w/out discomfort  Therapeutic Exercise AROM of L hand finger abduct/adduct 2x15 for intrinsic hand strengthening; OT providing verbal cues for consistency w/ arc of motion prn  AROM of isolated L ring finger PIPJ flex/ext and PIPJ flex/ext w/ OT blocking 15x; OT facilitated 2nd set w/ self-blocking, providing verbal cues, demo, and assist for understanding of positioning prn  AROM and PROM of MPJ flex/ext for L little finger 10x w/ blocking of other digits; completed 2 sets w/ self-blocking during 2nd set  Place  and hold of whole hand MPJ flex x10 followed by AROM of MPJ flex 10x; success w/ maintaining 5th finger flexion inconsistent but improved compared to prior visit  EDC glide exercise completed 15x w/ OT providing verbal cues and demonstration prn to facilitate little finger alignment consistently     PATIENT EDUCATION: See tx section above; ongoing condition-specific education Person educated: Patient Education method: Explanation Education comprehension: verbalized understanding   HOME EXERCISE PROGRAM: EDC glide PIPJ stretch MCPJ flex/extension (Printed handouts provided)  GOALS: Goals reviewed with patient? Yes  SHORT TERM GOALS: Target date: 12/06/2021      Status:  1 Pt will demonstrate independence w/ HEP designed for AROM/tendon gliding, strength, and stretching to prevent further deformity and maximize functional use of LUE during ADLs Baseline: to be administered MET  2 Pt will report being able to open various containers and/or tight/new jars w/ mild difficulty or better, incorporating compensatory strategies or AE prn  Baseline: moderate difficulty, per QuickDASH questionnaire Met - 11/29/21    LONG TERM GOALS: Target date: 12/27/2021      Status:  1 Pt will improve grip strength in L hand to at least 35 lbs w/out pain for functional use at home and during IADLs  Baseline: Right: 62 lbs; Left: 24 lbs Met - 11/29/21: 39 lbs  2 Pt will increase L ring finger PIPJ extension by at least 5 degrees for improved functional use of L hand and to prevent further deformity Baseline: about 15 deg from neutral D/C goal still with slight deficit in PIP J extension  3 Pt will increase L little finger MCPJ flexion to at least 25 degrees to improve functional grasp/grip Baseline: 2 degrees of MCPJ flexion MET    ASSESSMENT:  CLINICAL IMPRESSION: 12/06/21: He states no functional problems- able to do FMS, open jars, lift his wife, etc.  He only has occasional "sharp pains" in his  MCJ s of Lt hand digits 4,5, which OT feels is OA due to poor joint alignment now and use of hands. He does have significant OA and collapse deformities in b/l thumbs (CMC Js) and in Lt MF MCP J as well, but he states no pain there and these don't bother him. He was recommended to keep stretching MCPJs and was shown how to stretch thumb CMC Js safely. He states able to manage everything on his own and no further need for therapy.    PLAN: OT FREQUENCY: 1x/week (decreased frequency due to pt scheduling/request)  OT DURATION: 6 weeks (through 12/27/21)  PLANNED INTERVENTIONS: self care/ADL training, therapeutic exercise, therapeutic activity, manual therapy, passive range of motion, splinting, paraffin, fluidotherapy, moist heat, cryotherapy, patient/family education, and DME and/or AE instructions  RECOMMENDED OTHER SERVICES: None at this time  CONSULTED AND AGREED WITH PLAN OF CARE: Patient  PLAN FOR NEXT SESSION: He is d/c today as he states no significant pain or problems at home, and need to stay home and take care of his wife.     [name], OTR/L, CHT 12/06/2021, 3:59 PM   OCCUPATIONAL THERAPY DISCHARGE SUMMARY  Visits from Start of Care: 4  Current functional level related to goals / functional outcomes: Pt has met all goals to satisfactory levels and is pleased with outcomes.   Remaining deficits: Pt has no more significant functional deficits or pain.   Education / Equipment: Pt has all needed materials and education. Pt understands how to continue on with self-management. See tx notes for more details.   Patient agrees to discharge due to max benefits received from outpatient occupational therapy / hand therapy at this time.   [name], OTR/L, CHT 12/06/21

## 2021-12-13 ENCOUNTER — Ambulatory Visit: Payer: Medicare PPO | Admitting: Rehabilitative and Restorative Service Providers"

## 2021-12-14 ENCOUNTER — Ambulatory Visit: Payer: Medicare PPO | Admitting: Cardiology

## 2021-12-20 ENCOUNTER — Ambulatory Visit: Payer: Medicare PPO | Admitting: Rehabilitative and Restorative Service Providers"

## 2021-12-21 ENCOUNTER — Ambulatory Visit: Payer: Medicare PPO | Admitting: Cardiology

## 2021-12-21 NOTE — Progress Notes (Unsigned)
Cardiology Office Note   Date:  12/22/2021   ID:  Keith [name], DOB 07-14-1940, MRN [id number]  PCP:  Janith Lima, MD  Cardiologist:   Minus Breeding, MD Referring:  Janith Lima, MD  Chief Complaint  Patient presents with   Atrial Fibrillation      History of Present Illness: Keith [name] is a 81 y.o. male who presents for   evaluation of atrial fibrillation.  He is referred by Janith Lima, MD. he has had no past cardiac history.  He occasionally gets some dizziness and blurred vision but he is treated this with hydration in the past.  He is never felt palpitations.  He was incidentally found to have atrial fibrillation at his recent primary care appointment.   He would not of noticed he was in fibrillation.   At the last visit I ordered an echo which was unremarkable.  Zio demonstrated paroxysmal atrial tachycardia and SVT but no atrial fibrillation.  He does not feel his arrhythmia.  He is not having any palpitations, presyncope or syncope.  He is not having any chest pressure, neck or arm discomfort.  He has had no new shortness of breath, PND or orthopnea.  He has to do a lot of activities lifting his wife who he cares for.  She has dementia.   Past Medical History:  Diagnosis Date   Allergic rhinitis    Cancer ([place])    SKIN CANCER ON LIP (30 YR AGO)   DDD (degenerative disc disease), cervical    Hyperlipidemia    Hypothyroidism    PONV (postoperative nausea and vomiting)    2016 BACK SURGERY, IN RECOVERY HE HAD N/V    Past Surgical History:  Procedure Laterality Date   AMPUTATED Right 1961   TRAUMATIC AMPUTATED FINGERS RIGHT    BACK SURGERY  2016   TONSILLECTOMY     TRANSFORAMINAL LUMBAR INTERBODY FUSION (TLIF) WITH PEDICLE SCREW FIXATION 1 LEVEL Right 11/29/2017   Procedure: RIGHT SIDED LUMBAR 4-5 TRANSFORAMINAL LUMBAR INTERBODY FUSION WITH INSTRUMENTATION AND ALLOGRAFT;  Surgeon: Phylliss Bob, MD;  Location: [facility];  Service: Orthopedics;   Laterality: Right;     Current Outpatient Medications  Medication Sig Dispense Refill   diltiazem (CARDIZEM CD) 120 MG 24 hr capsule Take 1 capsule (120 mg total) by mouth daily. 90 capsule 3   ferrous sulfate 324 MG TBEC Take 324 mg by mouth daily.     fluticasone (FLONASE) 50 MCG/ACT nasal spray Place 2 sprays into both nostrils daily.     levothyroxine (SYNTHROID) 112 MCG tablet TAKE 1 TABLET(112 MCG) BY MOUTH DAILY BEFORE BREAKFAST 90 tablet 1   LIVALO 1 MG TABS TAKE 1 TABLET(1 MG) BY MOUTH DAILY 90 tablet 1   magnesium oxide (MAG-OX) 400 MG tablet Take 400 mg by mouth daily.     Misc Natural Products ([employer] PROSTATE MAX + PO) Take 1 tablet by mouth 2 (two) times daily.     No current facility-administered medications for this visit.    Allergies:   Atorvastatin and Rosuvastatin    ROS:  Please see the history of present illness.   Otherwise, review of systems are positive for none.   All other systems are reviewed and negative.    PHYSICAL EXAM: VS:  BP 96/60 (BP Location: Left Arm, Patient Position: Sitting, Cuff Size: Large)   Pulse (!) 59   Ht '5\' 10"'$  (1.778 m)   Wt 200 lb 3.2 oz (90.8 kg)  SpO2 96%   BMI 28.73 kg/m  , BMI Body mass index is 28.73 kg/m. GENERAL:  Well appearing NECK:  No jugular venous distention, waveform within normal limits, carotid upstroke brisk and symmetric, no bruits, no thyromegaly LUNGS:  Clear to auscultation bilaterally CHEST:  Unremarkable HEART:  PMI not displaced or sustained,S1 and S2 within normal limits, no S3, no S4, no clicks, no rubs, no murmurs ABD:  Flat, positive bowel sounds normal in frequency in pitch, no bruits, no rebound, no guarding, no midline pulsatile mass, no hepatomegaly, no splenomegaly EXT:  2 plus pulses throughout, no edema, no cyanosis no clubbing   Kidney EKG:  EKG is not ordered today.    Recent Labs: 10/13/2021: BUN 17; Creatinine, Ser 1.04; Hemoglobin 16.3; Platelets 164.0; Potassium 3.8; Sodium 139; TSH  2.21    Lipid Panel    Component Value Date/Time   CHOL 127 03/16/2021 1336   TRIG 116.0 03/16/2021 1336   HDL 35.80 (L) 03/16/2021 1336   CHOLHDL 4 03/16/2021 1336   VLDL 23.2 03/16/2021 1336   LDLCALC 68 03/16/2021 1336   LDLCALC 85 09/04/2019 1435      Wt Readings from Last 3 Encounters:  12/22/21 200 lb 3.2 oz (90.8 kg)  11/11/21 198 lb 12.8 oz (90.2 kg)  10/13/21 197 lb (89.4 kg)      Other studies Reviewed: Additional studies/ records that were reviewed today include: Zio patch and echo Review of the above records demonstrates:  Please see elsewhere in the note.     ASSESSMENT AND PLAN:  Atrial fib: CHA2DS2-VASc score would be 2.   Echo was unremarkable.  Monitor demonstrates no paroxysmal atrial fibrillation and he really wants to stay off blood thinners.  Clarified that his Cardizem he is on 120 mg once a day and we will make sure that it is long-acting.  He has some low blood pressure but he is tolerating this.  I told him to look out for symptoms of dizziness or lightheadedness.  He is going to follow his heart rate with his pulse oximeter.  For now I do not see a strong indication for anticoagulation and he prefers this.  Current medicines are reviewed at length with the patient today.  The patient does not have concerns regarding medicines.  The following changes have been made:  None  Labs/ tests ordered today include: None  No orders of the defined types were placed in this encounter.    Disposition:   FU with me in 12 month.     Signed, Minus Breeding, MD  12/22/2021 4:30 PM    [place]

## 2021-12-22 ENCOUNTER — Ambulatory Visit: Payer: Medicare PPO | Attending: Cardiology | Admitting: Cardiology

## 2021-12-22 ENCOUNTER — Encounter: Payer: Self-pay | Admitting: Cardiology

## 2021-12-22 VITALS — BP 96/60 | HR 59 | Ht 70.0 in | Wt 200.2 lb

## 2021-12-22 DIAGNOSIS — I480 Paroxysmal atrial fibrillation: Secondary | ICD-10-CM | POA: Diagnosis not present

## 2021-12-22 MED ORDER — DILTIAZEM HCL ER COATED BEADS 120 MG PO CP24: 120.0000 mg | ORAL_CAPSULE | Freq: Every day | ORAL | 3 refills | Status: DC

## 2021-12-22 NOTE — Patient Instructions (Signed)
Medication Instructions:   Changed to Diltiazem( Cardizem)  CD 120 mg daily   *If you need a refill on your cardiac medications before your next appointment, please call your pharmacy*   Lab Work: Not needed    Testing/Procedures:  Not needed  Follow-Up: At [facility], you and your health needs are our priority.  As part of our continuing mission to provide you with exceptional heart care, we have created designated Provider Care Teams.  These Care Teams include your primary Cardiologist (physician) and Advanced Practice Providers (APPs -  Physician Assistants and Nurse Practitioners) who all work together to provide you with the care you need, when you need it.     Your next appointment:   12 month(s)  The format for your next appointment:   In Person  Provider:   Minus Breeding, MD

## 2022-01-03 ENCOUNTER — Ambulatory Visit (INDEPENDENT_AMBULATORY_CARE_PROVIDER_SITE_OTHER): Payer: Medicare PPO

## 2022-01-03 DIAGNOSIS — E538 Deficiency of other specified B group vitamins: Secondary | ICD-10-CM

## 2022-01-03 MED ORDER — CYANOCOBALAMIN 1000 MCG/ML IJ SOLN
1000.0000 ug | Freq: Once | INTRAMUSCULAR | Status: AC
  Administered 2022-01-03: 1000 ug via INTRAMUSCULAR

## 2022-01-03 NOTE — Progress Notes (Signed)
After obtaining consent, and per orders of Dr. Ronnald Ramp, injection of B12 given in the right deltoid by Marrian Salvage. Patient instructed to report any adverse reaction to me immediately.

## 2022-02-02 ENCOUNTER — Ambulatory Visit (INDEPENDENT_AMBULATORY_CARE_PROVIDER_SITE_OTHER): Payer: Medicare PPO

## 2022-02-02 DIAGNOSIS — E538 Deficiency of other specified B group vitamins: Secondary | ICD-10-CM | POA: Diagnosis not present

## 2022-02-02 MED ORDER — CYANOCOBALAMIN 1000 MCG/ML IJ SOLN
1000.0000 ug | Freq: Once | INTRAMUSCULAR | Status: AC
  Administered 2022-02-02: 1000 ug via INTRAMUSCULAR

## 2022-02-02 NOTE — Progress Notes (Signed)
After obtaining consent, and per orders of Dr. Jones, injection of B12 given by Izaia Say P Lizet Kelso. Patient instructed to report any adverse reaction to me immediately.  

## 2022-02-04 DIAGNOSIS — J209 Acute bronchitis, unspecified: Secondary | ICD-10-CM | POA: Diagnosis not present

## 2022-02-16 ENCOUNTER — Ambulatory Visit: Payer: Medicare PPO | Admitting: Internal Medicine

## 2022-02-28 ENCOUNTER — Other Ambulatory Visit: Payer: Self-pay | Admitting: Internal Medicine

## 2022-02-28 DIAGNOSIS — E785 Hyperlipidemia, unspecified: Secondary | ICD-10-CM

## 2022-03-01 ENCOUNTER — Telehealth: Payer: Self-pay | Admitting: Internal Medicine

## 2022-03-01 NOTE — Telephone Encounter (Signed)
Patient called stating he is allergic to the Pitavastatin '1mg'$  and would like a prescription sent in for Livalo '1mg'$  instead.

## 2022-03-03 ENCOUNTER — Other Ambulatory Visit: Payer: Self-pay | Admitting: Internal Medicine

## 2022-03-03 DIAGNOSIS — E785 Hyperlipidemia, unspecified: Secondary | ICD-10-CM

## 2022-03-03 MED ORDER — LIVALO 1 MG PO TABS: 1.0000 | ORAL_TABLET | Freq: Every day | ORAL | 1 refills | Status: DC

## 2022-03-07 ENCOUNTER — Telehealth: Payer: Self-pay | Admitting: Internal Medicine

## 2022-03-07 NOTE — Telephone Encounter (Signed)
Patient called and is requesting a refill on his medication  LIVALO 1 MG TABS to be sent to the [facility] (Clinical). Patient provided phone number and reference number. Phone number is [phone], Reference # is [id number]. Best callback number for patient is [phone].

## 2022-03-07 NOTE — Telephone Encounter (Signed)
Pt scheduled for tomorrow 1/30 @ 11.20am

## 2022-03-08 ENCOUNTER — Encounter: Payer: Self-pay | Admitting: Internal Medicine

## 2022-03-08 ENCOUNTER — Ambulatory Visit: Payer: Medicare PPO | Admitting: Internal Medicine

## 2022-03-08 VITALS — BP 128/80 | HR 57 | Temp 98.3°F | Resp 16 | Ht 70.0 in | Wt 200.2 lb

## 2022-03-08 DIAGNOSIS — I4891 Unspecified atrial fibrillation: Secondary | ICD-10-CM | POA: Diagnosis not present

## 2022-03-08 DIAGNOSIS — E785 Hyperlipidemia, unspecified: Secondary | ICD-10-CM | POA: Diagnosis not present

## 2022-03-08 DIAGNOSIS — E039 Hypothyroidism, unspecified: Secondary | ICD-10-CM | POA: Diagnosis not present

## 2022-03-08 NOTE — Progress Notes (Signed)
Subjective:  Patient ID: Keith [name], male    DOB: March 18, 1940  Age: 82 y.o. MRN: [id number]  CC: Hypothyroidism, Hyperlipidemia, and Atrial Fibrillation   HPI Keith [name] presents for f/up -  He is active and denies chest pain, shortness of breath, diaphoresis, or edema.  Outpatient Medications Prior to Visit  Medication Sig Dispense Refill   diltiazem (CARDIZEM CD) 120 MG 24 hr capsule Take 1 capsule (120 mg total) by mouth daily. 90 capsule 3   ferrous sulfate 324 MG TBEC Take 324 mg by mouth daily.     fluticasone (FLONASE) 50 MCG/ACT nasal spray Place 2 sprays into both nostrils daily.     levothyroxine (SYNTHROID) 112 MCG tablet TAKE 1 TABLET(112 MCG) BY MOUTH DAILY BEFORE BREAKFAST 90 tablet 1   LIVALO 1 MG TABS Take 1 tablet (1 mg total) by mouth daily. 90 tablet 1   magnesium oxide (MAG-OX) 400 MG tablet Take 400 mg by mouth daily.     Misc Natural Products ([employer] PROSTATE MAX + PO) Take 1 tablet by mouth 2 (two) times daily.     No facility-administered medications prior to visit.    ROS Review of Systems  Constitutional:  Negative for diaphoresis and fatigue.  HENT: Negative.    Eyes: Negative.   Respiratory:  Negative for cough, chest tightness, shortness of breath and wheezing.   Cardiovascular:  Negative for chest pain, palpitations and leg swelling.  Gastrointestinal:  Negative for abdominal pain, diarrhea, nausea and vomiting.  Endocrine: Negative.   Genitourinary: Negative.  Negative for difficulty urinating.  Musculoskeletal:  Negative for arthralgias and myalgias.  Skin: Negative.  Negative for color change.  Neurological:  Negative for dizziness and weakness.  Hematological:  Negative for adenopathy. Does not bruise/bleed easily.  Psychiatric/Behavioral: Negative.      Objective:  BP 128/80 (BP Location: Left Arm, Patient Position: Sitting, Cuff Size: Normal)   Pulse (!) 57   Temp 98.3 F (36.8 C) (Oral)   Resp 16   Ht '5\' 10"'$  (1.778 m)   Wt 200  lb 3.2 oz (90.8 kg)   SpO2 97%   BMI 28.73 kg/m   BP Readings from Last 3 Encounters:  03/08/22 128/80  12/22/21 96/60  11/11/21 102/66    Wt Readings from Last 3 Encounters:  03/08/22 200 lb 3.2 oz (90.8 kg)  12/22/21 200 lb 3.2 oz (90.8 kg)  11/11/21 198 lb 12.8 oz (90.2 kg)    Physical Exam Vitals reviewed.  HENT:     Nose: Nose normal.     Mouth/Throat:     Mouth: Mucous membranes are moist.  Eyes:     General: No scleral icterus.    Conjunctiva/sclera: Conjunctivae normal.  Cardiovascular:     Rate and Rhythm: Normal rate and regular rhythm.     Heart sounds: No murmur heard. Pulmonary:     Effort: Pulmonary effort is normal.     Breath sounds: No stridor. No wheezing, rhonchi or rales.  Abdominal:     General: Abdomen is flat.     Palpations: There is no mass.     Tenderness: There is no abdominal tenderness. There is no guarding.     Hernia: No hernia is present.  Musculoskeletal:        General: Normal range of motion.     Cervical back: Neck supple.     Right lower leg: No edema.     Left lower leg: No edema.  Lymphadenopathy:     Cervical:  No cervical adenopathy.  Skin:    General: Skin is warm and dry.  Neurological:     General: No focal deficit present.  Psychiatric:        Mood and Affect: Mood normal.        Behavior: Behavior normal.     Lab Results  Component Value Date   WBC 8.7 10/13/2021   HGB 16.3 10/13/2021   HCT 49.0 10/13/2021   PLT 164.0 10/13/2021   GLUCOSE 101 (H) 10/13/2021   CHOL 127 03/16/2021   TRIG 116.0 03/16/2021   HDL 35.80 (L) 03/16/2021   LDLCALC 68 03/16/2021   ALT 19 08/04/2020   AST 21 08/04/2020   NA 139 10/13/2021   K 3.8 10/13/2021   CL 105 10/13/2021   CREATININE 1.04 10/13/2021   BUN 17 10/13/2021   CO2 26 10/13/2021   TSH 2.21 10/13/2021   PSA 3.28 03/16/2021   INR 1.01 11/27/2017   HGBA1C 6.1 03/16/2021    DG Hand Complete Left  Result Date: 08/10/2021 CLINICAL DATA:  Patient fell yesterday  with swelling in the left hand. EXAM: LEFT HAND - COMPLETE 3+ VIEW COMPARISON:  None Available. FINDINGS: Oblique moderately displaced, mildly impacted, fracture of the shaft of the little finger metacarpal. Oblique mildly displaced, mildly impacted, fracture of the shaft of the ring finger metacarpal. No significant angulation. Overlying soft tissue swelling. Narrowing of the radiocarpal joint. 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 Moderate osteoarthritis of the middle finger MCP joint. Mild osteoarthritis of scattered IP joints. Sequela of prior trauma at the ulnar styloid. Vascular calcifications in the visualized distal left forearm and left hand. IMPRESSION: 1. Oblique moderately displaced, mildly impacted fracture of the shaft of the little finger metacarpal. No significant angulation. 2. Oblique mildly displaced, mildly impacted fracture of the shaft of the ring finger metacarpal. No significant angulation. 3. Soft tissue swelling at the ulnar and dorsal aspect of the hand. 4. Osteoarthritis of scattered joints in the wrist and hand as described in the body of the report. Electronically Signed   By: Ileana Roup M.D.   On: 08/10/2021 20:23    Assessment & Plan:   Keith [name] was seen today for hypothyroidism, hyperlipidemia and atrial fibrillation.  Diagnoses and all orders for this visit:  Acquired hypothyroidism- He is euthyroid.  Atrial fibrillation with RVR ([place])- He has good rate and rhythm control.  Hyperlipidemia with target LDL less than 130- LDL goal achieved. Doing well on the statin    I am having Keith [name] maintain his magnesium oxide, ferrous sulfate, Misc Natural Products (CVS PROSTATE MAX + PO), levothyroxine, fluticasone, diltiazem, and Livalo.  No orders of the defined types were placed in this encounter.    Follow-up: No follow-ups on file.  Scarlette Calico, MD

## 2022-03-09 ENCOUNTER — Ambulatory Visit (INDEPENDENT_AMBULATORY_CARE_PROVIDER_SITE_OTHER): Payer: Medicare PPO | Admitting: *Deleted

## 2022-03-09 DIAGNOSIS — E538 Deficiency of other specified B group vitamins: Secondary | ICD-10-CM | POA: Diagnosis not present

## 2022-03-09 MED ORDER — CYANOCOBALAMIN 1000 MCG/ML IJ SOLN
1000.0000 ug | Freq: Once | INTRAMUSCULAR | Status: AC
  Administered 2022-03-09: 1000 ug via INTRAMUSCULAR

## 2022-03-09 NOTE — Progress Notes (Signed)
Pls cosign for B12 inj../lmb  

## 2022-04-06 ENCOUNTER — Ambulatory Visit (INDEPENDENT_AMBULATORY_CARE_PROVIDER_SITE_OTHER): Payer: Medicare PPO | Admitting: *Deleted

## 2022-04-06 DIAGNOSIS — E538 Deficiency of other specified B group vitamins: Secondary | ICD-10-CM

## 2022-04-06 MED ORDER — CYANOCOBALAMIN 1000 MCG/ML IJ SOLN
1000.0000 ug | Freq: Once | INTRAMUSCULAR | Status: AC
  Administered 2022-04-06: 1000 ug via INTRAMUSCULAR

## 2022-04-06 NOTE — Progress Notes (Signed)
Pls cosign for B12 inj../lmb  

## 2022-04-07 ENCOUNTER — Ambulatory Visit: Payer: Medicare PPO

## 2022-05-11 ENCOUNTER — Ambulatory Visit (INDEPENDENT_AMBULATORY_CARE_PROVIDER_SITE_OTHER): Payer: Medicare PPO | Admitting: *Deleted

## 2022-05-11 DIAGNOSIS — E538 Deficiency of other specified B group vitamins: Secondary | ICD-10-CM

## 2022-05-11 MED ORDER — CYANOCOBALAMIN 1000 MCG/ML IJ SOLN
1000.0000 ug | Freq: Once | INTRAMUSCULAR | Status: AC
  Administered 2022-05-11: 1000 ug via INTRAMUSCULAR

## 2022-05-11 NOTE — Progress Notes (Signed)
Pls cosign for B12 inj../lmb  

## 2022-06-07 ENCOUNTER — Other Ambulatory Visit: Payer: Self-pay | Admitting: Internal Medicine

## 2022-06-07 DIAGNOSIS — E039 Hypothyroidism, unspecified: Secondary | ICD-10-CM

## 2022-06-15 ENCOUNTER — Ambulatory Visit (INDEPENDENT_AMBULATORY_CARE_PROVIDER_SITE_OTHER): Payer: Medicare PPO

## 2022-06-15 DIAGNOSIS — E538 Deficiency of other specified B group vitamins: Secondary | ICD-10-CM

## 2022-06-15 MED ORDER — CYANOCOBALAMIN 1000 MCG/ML IJ SOLN
1000.0000 ug | Freq: Once | INTRAMUSCULAR | Status: AC
  Administered 2022-06-15: 1000 ug via INTRAMUSCULAR

## 2022-06-15 NOTE — Progress Notes (Signed)
Pt here for monthly B12 injection   B12 given IM and pt tolerated injection well.

## 2022-06-28 DIAGNOSIS — G3184 Mild cognitive impairment, so stated: Secondary | ICD-10-CM | POA: Diagnosis not present

## 2022-06-28 DIAGNOSIS — I10 Essential (primary) hypertension: Secondary | ICD-10-CM | POA: Diagnosis not present

## 2022-06-28 DIAGNOSIS — M4800 Spinal stenosis, site unspecified: Secondary | ICD-10-CM | POA: Diagnosis not present

## 2022-06-28 DIAGNOSIS — E039 Hypothyroidism, unspecified: Secondary | ICD-10-CM | POA: Diagnosis not present

## 2022-06-28 DIAGNOSIS — J309 Allergic rhinitis, unspecified: Secondary | ICD-10-CM | POA: Diagnosis not present

## 2022-06-28 DIAGNOSIS — R32 Unspecified urinary incontinence: Secondary | ICD-10-CM | POA: Diagnosis not present

## 2022-06-28 DIAGNOSIS — N529 Male erectile dysfunction, unspecified: Secondary | ICD-10-CM | POA: Diagnosis not present

## 2022-06-28 DIAGNOSIS — E785 Hyperlipidemia, unspecified: Secondary | ICD-10-CM | POA: Diagnosis not present

## 2022-06-28 DIAGNOSIS — M1990 Unspecified osteoarthritis, unspecified site: Secondary | ICD-10-CM | POA: Diagnosis not present

## 2022-07-05 ENCOUNTER — Telehealth: Payer: Self-pay

## 2022-07-05 NOTE — Telephone Encounter (Signed)
Contacted Keith [name] to schedule their annual wellness visit. Patient declined to schedule AWV at this time.  [employer] completed AWVS a few weeks ago per pt.  Agnes Lawrence, CMA (AAMA)  [facility]- AWV Program [phone]

## 2022-07-06 ENCOUNTER — Other Ambulatory Visit: Payer: Self-pay | Admitting: Internal Medicine

## 2022-07-18 ENCOUNTER — Ambulatory Visit (INDEPENDENT_AMBULATORY_CARE_PROVIDER_SITE_OTHER): Payer: Medicare PPO | Admitting: *Deleted

## 2022-07-18 DIAGNOSIS — E538 Deficiency of other specified B group vitamins: Secondary | ICD-10-CM

## 2022-07-18 MED ORDER — CYANOCOBALAMIN 1000 MCG/ML IJ SOLN
1000.0000 ug | Freq: Once | INTRAMUSCULAR | Status: AC
Start: 2022-07-18 — End: 2022-07-18
  Administered 2022-07-18: 1000 ug via INTRAMUSCULAR

## 2022-07-18 NOTE — Progress Notes (Signed)
Pls cosign for B12 inj../lmb  

## 2022-07-26 ENCOUNTER — Telehealth: Payer: Self-pay | Admitting: Family Medicine

## 2022-07-26 ENCOUNTER — Other Ambulatory Visit: Payer: Self-pay

## 2022-07-26 DIAGNOSIS — M5416 Radiculopathy, lumbar region: Secondary | ICD-10-CM

## 2022-07-26 NOTE — Telephone Encounter (Signed)
Spoke with patient. Epidural ordered.  

## 2022-07-26 NOTE — Telephone Encounter (Signed)
Pt requesting Dr. Katrinka Blazing to order repeat epidural. Per pt, last one done about six months ago (looks like 04/14/2021).  Pt has not had a visit in awhile, advised him a visit may be needed.

## 2022-08-02 NOTE — Progress Notes (Signed)
[facility] [address] [place] [address] Phone: [phone] Subjective:   Keith [name], am serving as a scribe for Dr. Antoine Primas.  I'm seeing this patient by the request  of:  [name], MD  CC: Back pain follow-up  [id number]  12/24/2019 Worsening symptoms worsening symptoms at this time.  We have seen patient previously.  Patient had responded previously to gabapentin and some other medications but he has discontinued them with him being the primary caregiver for his wife.  Patient wants to avoid anything that could make him a little somnolent.  I do think patient has responded fairly well to epidurals and will try an L5-S1 epidural with patient having some mild foot drop again.  Has responded in the past and states that the last one in September helped the back pain significantly but now the leg seems to be worse.  We asked multiple different questions and patient declined any type of headache, any difficulty with words, any chest pain or shortness of breath.  We discussed that he is due for labs again in January with primary care provider to check the thyroid.  Patient declined formal physical therapy.  Patient wants to avoid any surgical intervention but if he continues to have trouble he will consider it.  Follow-up with me again in 4 to 6 weeks after the epidural.   Updated 08/04/2022 Keith [name] is a 82 y.o. male coming in with complaint of back pain. Would like epidural. Patient states that he did see surgeon a couple years ago but said that he could not do anything for him. Had EMG. Pain and weakness in R leg all the time. Taking care of wife and bends over often to take care of her.         Past Medical History:  Diagnosis Date   Allergic rhinitis    Cancer (HCC)    SKIN CANCER ON LIP (30 YR AGO)   DDD (degenerative disc disease), cervical    Hyperlipidemia    Hypothyroidism    PONV (postoperative nausea and  vomiting)    2016 BACK SURGERY, IN RECOVERY HE HAD N/V   Past Surgical History:  Procedure Laterality Date   AMPUTATED Right 1961   TRAUMATIC AMPUTATED FINGERS RIGHT    BACK SURGERY  2016   TONSILLECTOMY     TRANSFORAMINAL LUMBAR INTERBODY FUSION (TLIF) WITH PEDICLE SCREW FIXATION 1 LEVEL Right 11/29/2017   Procedure: RIGHT SIDED LUMBAR 4-5 TRANSFORAMINAL LUMBAR INTERBODY FUSION WITH INSTRUMENTATION AND ALLOGRAFT;  Surgeon: Estill Bamberg, MD;  Location: [facility] OR;  Service: Orthopedics;  Laterality: Right;   Social History   Socioeconomic History   Marital status: Married    Spouse name: Not on file   Number of children: Not on file   Years of education: Not on file   Highest education level: Not on file  Occupational History   Occupation: Retired Scientist, research (life sciences): [employer]   Occupation: Physicist, medical bus  Tobacco Use   Smoking status: Never   Smokeless tobacco: Never  Building services engineer Use: Never used  Substance and Sexual Activity   Alcohol use: No   Drug use: No   Sexual activity: Not Currently  Other Topics Concern   Not on file  Social History Narrative   Regular Exercise -  YES         Social Determinants of Health   Financial Resource Strain: Low Risk  (  07/01/2021)   Overall Financial Resource Strain (CARDIA)    Difficulty of Paying Living Expenses: Not hard at all  Food Insecurity: No Food Insecurity (07/01/2021)   Hunger Vital Sign    Worried About Running Out of Food in the Last Year: Never true    Ran Out of Food in the Last Year: Never true  Transportation Needs: No Transportation Needs (07/01/2021)   PRAPARE - Administrator, Civil Service (Medical): No    Lack of Transportation (Non-Medical): No  Physical Activity: Sufficiently Active (07/01/2021)   Exercise Vital Sign    Days of Exercise per Week: 5 days    Minutes of Exercise per Session: 30 min  Stress: No Stress Concern Present (07/01/2021)   [employer] of  Occupational Health - Occupational Stress Questionnaire    Feeling of Stress : Not at all  Social Connections: Socially Integrated (07/01/2021)   Social Connection and Isolation Panel [NHANES]    Frequency of Communication with Friends and Family: More than three times a week    Frequency of Social Gatherings with Friends and Family: More than three times a week    Attends Religious Services: More than 4 times per year    Active Member of [employer] or Organizations: No    Attends Engineer, structural: More than 4 times per year    Marital Status: Married   Allergies  Allergen Reactions   Atorvastatin Other (See Comments)    leg cramps   Rosuvastatin Other (See Comments)    myalgias   Family History  Problem Relation Age of Onset   Diabetes Mother    Coronary artery disease Father 70       Fatal MI   Diabetes Father    Coronary artery disease Other    Breast cancer Other     Current Outpatient Medications (Endocrine & Metabolic):    levothyroxine (SYNTHROID) 112 MCG tablet, Take 1 tablet (112 mcg total) by mouth daily before breakfast. Annual appt due in Sept must see provider for future refills  Current Outpatient Medications (Cardiovascular):    diltiazem (CARDIZEM CD) 120 MG 24 hr capsule, Take 1 capsule (120 mg total) by mouth daily.   LIVALO 1 MG TABS, Take 1 tablet (1 mg total) by mouth daily.  Current Outpatient Medications (Respiratory):    fluticasone (FLONASE) 50 MCG/ACT nasal spray, SHAKE LIQUID AND USE 2 SPRAYS IN EACH NOSTRIL DAILY   Current Outpatient Medications (Hematological):    ferrous sulfate 324 MG TBEC, Take 324 mg by mouth daily.  Current Outpatient Medications (Other):    magnesium oxide (MAG-OX) 400 MG tablet, Take 400 mg by mouth daily.   Misc Natural Products (CVS PROSTATE MAX + PO), Take 1 tablet by mouth 2 (two) times daily.   Reviewed prior external information including notes and imaging from  primary care provider As well as notes  that were available from care everywhere and other healthcare systems.  Past medical history, social, surgical and family history all reviewed in electronic medical record.  No pertanent information unless stated regarding to the chief complaint.   Review of Systems:  No headache, visual changes, nausea, vomiting, diarrhea, constipation, dizziness, abdominal pain, skin rash, fevers, chills, night sweats, weight loss, swollen lymph nodes, body aches, joint swelling, chest pain, shortness of breath, mood changes. POSITIVE muscle aches  Objective  Blood pressure 110/82, pulse 62, height 5\' 10"  (1.778 m), weight 198 lb (89.8 kg), SpO2 97 %.   General: No apparent distress alert  and oriented x3 mood and affect normal, dressed appropriately.  HEENT: Pupils equal, extraocular movements intact  Respiratory: Patient's speak in full sentences and does not appear short of breath  Cardiovascular: No lower extremity edema, non tender, no erythema  Back exam does have significant loss of lordosis.  Patient does have weakness noted of the right lower extremity.  Some mild atrophy noted of the calf. Patient does have foot drop noted.  2 out of 5 strength in dorsiflexion on the right foot.  Antalgic gait noted.    Impression and Recommendations:     The above documentation has been reviewed and is accurate and complete Judi Saa, DO

## 2022-08-03 ENCOUNTER — Other Ambulatory Visit: Payer: Medicare PPO

## 2022-08-04 ENCOUNTER — Encounter: Payer: Self-pay | Admitting: Family Medicine

## 2022-08-04 ENCOUNTER — Ambulatory Visit (INDEPENDENT_AMBULATORY_CARE_PROVIDER_SITE_OTHER): Payer: Medicare PPO

## 2022-08-04 ENCOUNTER — Ambulatory Visit: Payer: Medicare PPO | Admitting: Family Medicine

## 2022-08-04 VITALS — BP 110/82 | HR 62 | Ht 70.0 in | Wt 198.0 lb

## 2022-08-04 DIAGNOSIS — Z981 Arthrodesis status: Secondary | ICD-10-CM | POA: Diagnosis not present

## 2022-08-04 DIAGNOSIS — M545 Low back pain, unspecified: Secondary | ICD-10-CM | POA: Diagnosis not present

## 2022-08-04 DIAGNOSIS — M5416 Radiculopathy, lumbar region: Secondary | ICD-10-CM | POA: Diagnosis not present

## 2022-08-04 DIAGNOSIS — M5116 Intervertebral disc disorders with radiculopathy, lumbar region: Secondary | ICD-10-CM | POA: Diagnosis not present

## 2022-08-04 NOTE — Assessment & Plan Note (Addendum)
Patient does have lumbar radiculopathy noted.  Does have foot drop on the right side.  We discussed with patient again at great length about the pulse as well as the AFL.  Patient declined these.  He does not want to have the intention.  Patient will continue to stay active otherwise.  We discussed with patient different treatment options and patient at this point would like to consider another epidural which has responded seems to get some benefit for at least 6 months or so.  Follow-up with me again in 6 to 8 weeks otherwise.

## 2022-08-04 NOTE — Patient Instructions (Addendum)
Epidural [phone] Xray today Consider AFO if needed See me when you need me

## 2022-08-17 ENCOUNTER — Ambulatory Visit: Payer: Medicare PPO

## 2022-08-18 ENCOUNTER — Ambulatory Visit (INDEPENDENT_AMBULATORY_CARE_PROVIDER_SITE_OTHER): Payer: Medicare PPO

## 2022-08-18 ENCOUNTER — Ambulatory Visit: Payer: Medicare PPO

## 2022-08-18 DIAGNOSIS — E538 Deficiency of other specified B group vitamins: Secondary | ICD-10-CM | POA: Diagnosis not present

## 2022-08-18 MED ORDER — CYANOCOBALAMIN 1000 MCG/ML IJ SOLN
1000.0000 ug | Freq: Once | INTRAMUSCULAR | Status: AC
Start: 2022-08-18 — End: 2022-08-18
  Administered 2022-08-18: 1000 ug via INTRAMUSCULAR

## 2022-08-18 NOTE — Progress Notes (Signed)
Pt here for monthly B12 injection per   B12 1000mcg given IM and pt tolerated injection well.    

## 2022-08-22 ENCOUNTER — Ambulatory Visit: Payer: Medicare PPO | Admitting: Family Medicine

## 2022-08-29 ENCOUNTER — Encounter: Payer: Self-pay | Admitting: Internal Medicine

## 2022-08-29 ENCOUNTER — Ambulatory Visit: Payer: Medicare PPO | Admitting: Internal Medicine

## 2022-08-29 VITALS — BP 128/76 | HR 71 | Temp 98.1°F | Resp 16 | Ht 70.0 in | Wt 199.0 lb

## 2022-08-29 DIAGNOSIS — E538 Deficiency of other specified B group vitamins: Secondary | ICD-10-CM

## 2022-08-29 DIAGNOSIS — E785 Hyperlipidemia, unspecified: Secondary | ICD-10-CM

## 2022-08-29 DIAGNOSIS — R739 Hyperglycemia, unspecified: Secondary | ICD-10-CM | POA: Diagnosis not present

## 2022-08-29 DIAGNOSIS — E039 Hypothyroidism, unspecified: Secondary | ICD-10-CM | POA: Diagnosis not present

## 2022-08-29 DIAGNOSIS — Z0001 Encounter for general adult medical examination with abnormal findings: Secondary | ICD-10-CM

## 2022-08-29 DIAGNOSIS — Z0000 Encounter for general adult medical examination without abnormal findings: Secondary | ICD-10-CM | POA: Diagnosis not present

## 2022-08-29 LAB — HEPATIC FUNCTION PANEL
ALT: 19 U/L (ref 0–53)
AST: 18 U/L (ref 0–37)
Albumin: 3.8 g/dL (ref 3.5–5.2)
Alkaline Phosphatase: 51 U/L (ref 39–117)
Bilirubin, Direct: 0.3 mg/dL (ref 0.0–0.3)
Total Bilirubin: 0.8 mg/dL (ref 0.2–1.2)
Total Protein: 6.7 g/dL (ref 6.0–8.3)

## 2022-08-29 LAB — CBC WITH DIFFERENTIAL/PLATELET
Basophils Absolute: 0.1 10*3/uL (ref 0.0–0.1)
Basophils Relative: 0.7 % (ref 0.0–3.0)
Eosinophils Absolute: 0.1 10*3/uL (ref 0.0–0.7)
Eosinophils Relative: 1.6 % (ref 0.0–5.0)
HCT: 47.9 % (ref 39.0–52.0)
Hemoglobin: 15.6 g/dL (ref 13.0–17.0)
Lymphocytes Relative: 19.8 % (ref 12.0–46.0)
Lymphs Abs: 1.6 10*3/uL (ref 0.7–4.0)
MCHC: 32.7 g/dL (ref 30.0–36.0)
MCV: 93.5 fl (ref 78.0–100.0)
Monocytes Absolute: 0.7 10*3/uL (ref 0.1–1.0)
Monocytes Relative: 8.4 % (ref 3.0–12.0)
Neutro Abs: 5.8 10*3/uL (ref 1.4–7.7)
Neutrophils Relative %: 69.5 % (ref 43.0–77.0)
Platelets: 185 10*3/uL (ref 150.0–400.0)
RBC: 5.12 Mil/uL (ref 4.22–5.81)
RDW: 14.2 % (ref 11.5–15.5)
WBC: 8.3 10*3/uL (ref 4.0–10.5)

## 2022-08-29 LAB — BASIC METABOLIC PANEL
BUN: 17 mg/dL (ref 6–23)
CO2: 27 mEq/L (ref 19–32)
Calcium: 8.9 mg/dL (ref 8.4–10.5)
Chloride: 105 mEq/L (ref 96–112)
Creatinine, Ser: 1.06 mg/dL (ref 0.40–1.50)
GFR: 66.08 mL/min (ref >60.00)
Glucose, Bld: 119 mg/dL — ABNORMAL HIGH (ref 70–99)
Potassium: 4 mEq/L (ref 3.5–5.1)
Sodium: 140 mEq/L (ref 135–145)

## 2022-08-29 LAB — LIPID PANEL
Cholesterol: 133 mg/dL (ref 0–200)
HDL: 35.3 mg/dL — ABNORMAL LOW (ref >39.00)
LDL Cholesterol: 68 mg/dL (ref 0–99)
NonHDL: 97.47
Total CHOL/HDL Ratio: 4
Triglycerides: 148 mg/dL (ref 0.0–149.0)
VLDL: 29.6 mg/dL (ref 0.0–40.0)

## 2022-08-29 LAB — TSH: TSH: 2.27 u[IU]/mL (ref 0.35–5.50)

## 2022-08-29 LAB — HEMOGLOBIN A1C: Hgb A1c MFr Bld: 6 % (ref 4.6–6.5)

## 2022-08-29 MED ORDER — CYANOCOBALAMIN 2000 MCG PO TABS
2000.0000 ug | ORAL_TABLET | Freq: Every day | ORAL | 1 refills | Status: DC
Start: 2022-08-29 — End: 2023-01-25

## 2022-08-29 MED ORDER — LEVOTHYROXINE SODIUM 112 MCG PO TABS
112.0000 ug | ORAL_TABLET | Freq: Every day | ORAL | 1 refills | Status: DC
Start: 2022-08-29 — End: 2023-01-25

## 2022-08-29 MED ORDER — LIVALO 1 MG PO TABS
1.0000 | ORAL_TABLET | Freq: Every day | ORAL | 1 refills | Status: DC
Start: 2022-08-29 — End: 2023-03-06

## 2022-08-29 NOTE — Patient Instructions (Signed)
Health Maintenance, Male Adopting a healthy lifestyle and getting preventive care are important in promoting health and wellness. Ask your health care provider about: The right schedule for you to have regular tests and exams. Things you can do on your own to prevent diseases and keep yourself healthy. What should I know about diet, weight, and exercise? Eat a healthy diet  Eat a diet that includes plenty of vegetables, fruits, low-fat dairy products, and lean protein. Do not eat a lot of foods that are high in solid fats, added sugars, or sodium. Maintain a healthy weight Body mass index (BMI) is a measurement that can be used to identify possible weight problems. It estimates body fat based on height and weight. Your health care provider can help determine your BMI and help you achieve or maintain a healthy weight. Get regular exercise Get regular exercise. This is one of the most important things you can do for your health. Most adults should: Exercise for at least 150 minutes each week. The exercise should increase your heart rate and make you sweat (moderate-intensity exercise). Do strengthening exercises at least twice a week. This is in addition to the moderate-intensity exercise. Spend less time sitting. Even light physical activity can be beneficial. Watch cholesterol and blood lipids Have your blood tested for lipids and cholesterol at 81 years of age, then have this test every 5 years. You may need to have your cholesterol levels checked more often if: Your lipid or cholesterol levels are high. You are older than 81 years of age. You are at high risk for heart disease. What should I know about cancer screening? Many types of cancers can be detected early and may often be prevented. Depending on your health history and family history, you may need to have cancer screening at various ages. This may include screening for: Colorectal cancer. Prostate cancer. Skin cancer. Lung  cancer. What should I know about heart disease, diabetes, and high blood pressure? Blood pressure and heart disease High blood pressure causes heart disease and increases the risk of stroke. This is more likely to develop in people who have high blood pressure readings or are overweight. Talk with your health care provider about your target blood pressure readings. Have your blood pressure checked: Every 3-5 years if you are 18-39 years of age. Every year if you are 40 years old or older. If you are between the ages of 65 and 75 and are a current or former smoker, ask your health care provider if you should have a one-time screening for abdominal aortic aneurysm (AAA). Diabetes Have regular diabetes screenings. This checks your fasting blood sugar level. Have the screening done: Once every three years after age 45 if you are at a normal weight and have a low risk for diabetes. More often and at a younger age if you are overweight or have a high risk for diabetes. What should I know about preventing infection? Hepatitis B If you have a higher risk for hepatitis B, you should be screened for this virus. Talk with your health care provider to find out if you are at risk for hepatitis B infection. Hepatitis C Blood testing is recommended for: Everyone born from 1945 through 1965. Anyone with known risk factors for hepatitis C. Sexually transmitted infections (STIs) You should be screened each year for STIs, including gonorrhea and chlamydia, if: You are sexually active and are younger than 81 years of age. You are older than 81 years of age and your   health care provider tells you that you are at risk for this type of infection. Your sexual activity has changed since you were last screened, and you are at increased risk for chlamydia or gonorrhea. Ask your health care provider if you are at risk. Ask your health care provider about whether you are at high risk for HIV. Your health care provider  may recommend a prescription medicine to help prevent HIV infection. If you choose to take medicine to prevent HIV, you should first get tested for HIV. You should then be tested every 3 months for as long as you are taking the medicine. Follow these instructions at home: Alcohol use Do not drink alcohol if your health care provider tells you not to drink. If you drink alcohol: Limit how much you have to 0-2 drinks a day. Know how much alcohol is in your drink. In the [place] one drink equals one 12 oz bottle of beer (355 mL), one 5 oz glass of wine (148 mL), or one 1 oz glass of hard liquor (44 mL). Lifestyle Do not use any products that contain nicotine or tobacco. These products include cigarettes, chewing tobacco, and vaping devices, such as e-cigarettes. If you need help quitting, ask your health care provider. Do not use street drugs. Do not share needles. Ask your health care provider for help if you need support or information about quitting drugs. General instructions Schedule regular health, dental, and eye exams. Stay current with your vaccines. Tell your health care provider if: You often feel depressed. You have ever been abused or do not feel safe at home. Summary Adopting a healthy lifestyle and getting preventive care are important in promoting health and wellness. Follow your health care provider's instructions about healthy diet, exercising, and getting tested or screened for diseases. Follow your health care provider's instructions on monitoring your cholesterol and blood pressure. This information is not intended to replace advice given to you by your health care provider. Make sure you discuss any questions you have with your health care provider. Document Revised: 06/15/2020 Document Reviewed: 06/15/2020 Elsevier Patient Education  2024 [employer] Inc[employer]  

## 2022-08-29 NOTE — Progress Notes (Unsigned)
Subjective:  Patient ID: Keith [name], male    DOB: 01/24/1941  Age: 82 y.o. MRN: [id number]  CC: Annual Exam, Anemia, Hyperlipidemia, and Hypothyroidism   HPI Keith [name] presents for a CPX and f/up ---  He is active and has intermittent dizziness.  He denies chest pain, shortness of breath, diaphoresis, or edema.  He would like to stop getting B12 injections and instead changed to an oral B12 supplement.  He does not like to leave the house because he takes care of his wife who is bedridden.  He is concerned about a lesion on his chest.   ROS Review of Systems  Constitutional: Negative.  Negative for diaphoresis and fatigue.  HENT: Negative.    Eyes:  Positive for visual disturbance.  Respiratory: Negative.  Negative for cough, chest tightness, shortness of breath and wheezing.   Cardiovascular:  Negative for chest pain, palpitations and leg swelling.  Gastrointestinal:  Negative for abdominal pain, constipation, diarrhea, nausea and vomiting.  Genitourinary: Negative.  Negative for difficulty urinating.  Musculoskeletal: Negative.  Negative for arthralgias and myalgias.  Skin: Negative.   Neurological:  Positive for dizziness. Negative for weakness.  Hematological:  Negative for adenopathy. Does not bruise/bleed easily.  Psychiatric/Behavioral:  Positive for dysphoric mood. Negative for sleep disturbance and suicidal ideas. The patient is not nervous/anxious.     Objective:  BP 128/76 (BP Location: Right Arm, Patient Position: Sitting, Cuff Size: Large)   Pulse 71   Temp 98.1 F (36.7 C) (Oral)   Resp 16   Ht 5\' 10"  (1.778 m)   Wt 199 lb (90.3 kg)   SpO2 96%   BMI 28.55 kg/m   BP Readings from Last 3 Encounters:  08/29/22 128/76  08/04/22 110/82  03/08/22 128/80    Wt Readings from Last 3 Encounters:  08/29/22 199 lb (90.3 kg)  08/04/22 198 lb (89.8 kg)  03/08/22 200 lb 3.2 oz (90.8 kg)    Physical Exam Vitals reviewed.  Constitutional:      Appearance:  Normal appearance.  HENT:     Mouth/Throat:     Mouth: Mucous membranes are moist.  Eyes:     General: No scleral icterus.    Conjunctiva/sclera: Conjunctivae normal.  Cardiovascular:     Rate and Rhythm: Normal rate and regular rhythm.     Heart sounds: No murmur heard. Pulmonary:     Effort: Pulmonary effort is normal.     Breath sounds: No stridor. No wheezing, rhonchi or rales.  Abdominal:     General: Abdomen is flat.     Palpations: There is no mass.     Tenderness: There is no abdominal tenderness. There is no guarding.     Hernia: A hernia is present.  Musculoskeletal:        General: Normal range of motion.     Cervical back: Neck supple.     Right lower leg: No edema.     Left lower leg: No edema.  Lymphadenopathy:     Cervical: No cervical adenopathy.  Skin:    Findings: Lesion present.  Neurological:     General: No focal deficit present.     Mental Status: He is alert. Mental status is at baseline.  Psychiatric:        Attention and Perception: Attention and perception normal.        Mood and Affect: Mood is depressed. Mood is not anxious. Affect is tearful.        Speech: Speech normal.  Behavior: Behavior normal. Behavior is cooperative.        Thought Content: Thought content normal. Thought content is not paranoid or delusional. Thought content does not include homicidal or suicidal ideation.        Cognition and Memory: Cognition normal.     Lab Results  Component Value Date   WBC 8.3 08/29/2022   HGB 15.6 08/29/2022   HCT 47.9 08/29/2022   PLT 185.0 08/29/2022   GLUCOSE 119 (H) 08/29/2022   CHOL 133 08/29/2022   TRIG 148.0 08/29/2022   HDL 35.30 (L) 08/29/2022   LDLCALC 68 08/29/2022   ALT 19 08/29/2022   AST 18 08/29/2022   NA 140 08/29/2022   K 4.0 08/29/2022   CL 105 08/29/2022   CREATININE 1.06 08/29/2022   BUN 17 08/29/2022   CO2 27 08/29/2022   TSH 2.27 08/29/2022   PSA 3.28 03/16/2021   INR 1.01 11/27/2017   HGBA1C 6.0  08/29/2022    DG Hand Complete Left  Result Date: 08/10/2021 CLINICAL DATA:  Patient fell yesterday with swelling in the left hand. EXAM: LEFT HAND - COMPLETE 3+ VIEW COMPARISON:  None Available. FINDINGS: Oblique moderately displaced, mildly impacted, fracture of the shaft of the little finger metacarpal. Oblique mildly displaced, mildly impacted, fracture of the shaft of the ring finger metacarpal. No significant angulation. Overlying soft tissue swelling. Narrowing of the radiocarpal joint. 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 Moderate osteoarthritis of the middle finger MCP joint. Mild osteoarthritis of scattered IP joints. Sequela of prior trauma at the ulnar styloid. Vascular calcifications in the visualized distal left forearm and left hand. IMPRESSION: 1. Oblique moderately displaced, mildly impacted fracture of the shaft of the little finger metacarpal. No significant angulation. 2. Oblique mildly displaced, mildly impacted fracture of the shaft of the ring finger metacarpal. No significant angulation. 3. Soft tissue swelling at the ulnar and dorsal aspect of the hand. 4. Osteoarthritis of scattered joints in the wrist and hand as described in the body of the report. Electronically Signed   By: Sherron Ales M.D.   On: 08/10/2021 20:23    Assessment & Plan:  B12 nutritional deficiency -     CBC with Differential/Platelet; Future -     Cyanocobalamin; Take 1 tablet (2,000 mcg total) by mouth daily.  Dispense: 90 tablet; Refill: 1  Hyperlipidemia with target LDL less than 130- LDL goal achieved. Doing well on the statin  -     Lipid panel; Future -     Hepatic function panel; Future -     Hepatic function panel; Future -     Livalo; Take 1 tablet (1 mg total) by mouth daily.  Dispense: 90 tablet; Refill: 1  Acquired hypothyroidism - He is euthyroid -     TSH; Future -     Hepatic function panel; Future -     Levothyroxine Sodium; Take 1 tablet (112 mcg total) by mouth daily before  breakfast. Annual appt due in Sept must see provider for future refills  Dispense: 90 tablet; Refill: 1  Chronic hyperglycemia -     Basic metabolic panel; Future -     Hemoglobin A1c; Future  Encounter for general adult medical examination with abnormal findings - Exam completed, labs reviewed, vaccines reviewed and updated, cancer screenings are not indicated, pt ed material was given.      Follow-up: Return in about 6 months (around 03/01/2023).  Sanda Linger, MD

## 2022-09-11 ENCOUNTER — Encounter: Payer: Self-pay | Admitting: Internal Medicine

## 2022-09-19 ENCOUNTER — Ambulatory Visit (INDEPENDENT_AMBULATORY_CARE_PROVIDER_SITE_OTHER): Payer: Medicare PPO

## 2022-09-19 DIAGNOSIS — E538 Deficiency of other specified B group vitamins: Secondary | ICD-10-CM | POA: Diagnosis not present

## 2022-09-19 MED ORDER — CYANOCOBALAMIN 1000 MCG/ML IJ SOLN
1000.0000 ug | Freq: Once | INTRAMUSCULAR | Status: AC
Start: 2022-09-19 — End: 2022-09-19
  Administered 2022-09-19: 1000 ug via INTRAMUSCULAR

## 2022-09-19 NOTE — Progress Notes (Signed)
Pt here for monthly B12 injection per Dr. Yetta Barre  B12 given IM. and pt tolerated injection well.

## 2022-09-27 ENCOUNTER — Telehealth: Payer: Self-pay | Admitting: Internal Medicine

## 2022-09-27 NOTE — Telephone Encounter (Signed)
Patient gets regular B12 shots and said he spoke with Dr. Yetta Barre about stopping them. He would like to know if that is possible and if he needs to take a different form of it when he stops the injections. Patient would like a call back at [phone].

## 2022-09-27 NOTE — Telephone Encounter (Signed)
Pt has been informed that B12 Supplement was sent to his pharmacy on 7/22. Pt expressed understanding.

## 2022-11-21 NOTE — Discharge Instructions (Signed)

## 2022-11-22 ENCOUNTER — Inpatient Hospital Stay
Admission: RE | Admit: 2022-11-22 | Discharge: 2022-11-22 | Disposition: A | Discharge status: Home / Self Care | Payer: Medicare PPO | Source: Ambulatory Visit | Attending: Family Medicine | Admitting: Family Medicine

## 2022-11-22 DIAGNOSIS — M47817 Spondylosis without myelopathy or radiculopathy, lumbosacral region: Secondary | ICD-10-CM | POA: Diagnosis not present

## 2022-11-22 DIAGNOSIS — M545 Low back pain, unspecified: Secondary | ICD-10-CM

## 2022-11-22 MED ORDER — METHYLPREDNISOLONE ACETATE 40 MG/ML INJ SUSP (RADIOLOG
80.0000 mg | Freq: Once | INTRAMUSCULAR | Status: AC
  Administered 2022-11-22: 80 mg via EPIDURAL

## 2022-11-22 MED ORDER — IOPAMIDOL (ISOVUE-M 200) INJECTION 41%
1.0000 mL | Freq: Once | INTRAMUSCULAR | Status: AC
  Administered 2022-11-22: 1 mL via EPIDURAL

## 2022-11-23 ENCOUNTER — Telehealth: Payer: Self-pay | Admitting: Internal Medicine

## 2022-11-23 NOTE — Telephone Encounter (Signed)
Pt called inquiring about a handicap sign because of his leg and he can't walk that far. Please advise.

## 2022-11-24 NOTE — Telephone Encounter (Signed)
Form completed and signed by PCP  Pt requested that it be mailed to him.

## 2022-12-12 ENCOUNTER — Ambulatory Visit: Payer: Medicare PPO | Admitting: Family Medicine

## 2022-12-14 NOTE — Progress Notes (Unsigned)
  Cardiology Office Note:   Date:  12/15/2022  ID:  Keith [name], DOB [birth year], MRN [id number] PCP: [name], MD  [facility] Providers Cardiologist:  Rollene Rotunda, MD {  History of Present Illness:   Keith [name] is a 82 y.o. male who presents for   evaluation of atrial fibrillation.   This was an isolated episode so we did not start anticoagulation.  He wanted to try to stay off medications.  I ordered an echo which was unremarkable.  Zio demonstrated paroxysmal atrial tachycardia and SVT but no atrial fibrillation.  He returns for follow-up.  Since I saw him he has been getting episodes of numbness on the right side of his face.  This happens sporadically.  He also has some occasional blurred vision.  He is not really feeling tachypalpitations.  He has not had any presyncope or syncope.  He has not had any chest pressure, neck or arm discomfort.  He did lose his wife who died of Alzheimer's.  He has several other people living in his house including some foster children.  ROS: As stated in the HPI and negative for all other systems.  Studies Reviewed:    EKG:   EKG Interpretation Date/Time:  Thursday December 15 2022 09:36:54 EST Ventricular Rate:  63 PR Interval:  198 QRS Duration:  96 QT Interval:  406 QTC Calculation: 415 R Axis:   -18  Text Interpretation: Sinus rhythm with Premature atrial complexes When compared with ECG of 04-Aug-2020 13:32, No significant change since last tracing Confirmed by Rollene Rotunda ([id number]) on 12/15/2022 9:54:03 AM  Risk Assessment/Calculations:    CHA2DS2-VASc Score = 2   This indicates a 2.2% annual risk of stroke. The patient's score is based upon: CHF History: 0 HTN History: 0 Diabetes History: 0 Stroke History: 0 Vascular Disease History: 0 Age Score: 2 Gender Score: 0    Physical Exam:   VS:  BP 112/70 (BP Location: Left Arm, Patient Position: Sitting, Cuff Size: Normal)   Pulse 67   Ht 5\' 10"  (1.778 m)   Wt  196 lb (88.9 kg)   SpO2 97%   BMI 28.12 kg/m    Wt Readings from Last 3 Encounters:  12/15/22 196 lb (88.9 kg)  08/29/22 199 lb (90.3 kg)  08/04/22 198 lb (89.8 kg)     GEN: Well nourished, well developed in no acute distress NECK: No JVD; No carotid bruits CARDIAC: RRR, no murmurs, rubs, gallops RESPIRATORY:  Clear to auscultation without rales, wheezing or rhonchi  ABDOMEN: Soft, non-tender, non-distended EXTREMITIES:  No edema; No deformity   ASSESSMENT AND PLAN:   Atrial fib: CHA2DS2-VASc score would be 2.  We had a long discussion about this today.  He is worried about the possibility of his symptoms being TIA and they possibly could be.  He had documented fibrillation of 1 EKG.  I think given this and current recommendations it is reasonable to go ahead and start him on Xarelto 20 mg daily.  I will also check carotid Dopplers.  He needs a CBC in 6 weeks.  We talked about the possibility of doing an implanted monitor and he prefers not to do this proceed directly to anticoagulation which I think is reasonable.  Follow up with APP in six months.   Signed, Rollene Rotunda, MD

## 2022-12-15 ENCOUNTER — Encounter: Payer: Self-pay | Admitting: Cardiology

## 2022-12-15 ENCOUNTER — Ambulatory Visit: Payer: Medicare PPO | Attending: Cardiology | Admitting: Cardiology

## 2022-12-15 VITALS — BP 112/70 | HR 67 | Ht 70.0 in | Wt 196.0 lb

## 2022-12-15 DIAGNOSIS — Z79899 Other long term (current) drug therapy: Secondary | ICD-10-CM

## 2022-12-15 DIAGNOSIS — G459 Transient cerebral ischemic attack, unspecified: Secondary | ICD-10-CM

## 2022-12-15 DIAGNOSIS — R200 Anesthesia of skin: Secondary | ICD-10-CM | POA: Diagnosis not present

## 2022-12-15 DIAGNOSIS — I4891 Unspecified atrial fibrillation: Secondary | ICD-10-CM

## 2022-12-15 MED ORDER — RIVAROXABAN 20 MG PO TABS: 20.0000 mg | ORAL_TABLET | Freq: Every day | ORAL | 11 refills | Status: DC

## 2022-12-15 NOTE — Patient Instructions (Addendum)
Medication Instructions:  START XARELTO 20 MG DAILY *If you need a refill on your cardiac medications before your next appointment, please call your pharmacy*  Lab: CBC- Please return for Blood Work in 6 WEEKS. No appointment needed, lab here at the office is open Monday-Friday from 8AM to 4PM and closed daily for lunch from 12:45-1:45.    Testing/Procedures: Your physician has requested that you have a carotid duplex. This test is an ultrasound of the carotid arteries in your neck. It looks at blood flow through these arteries that supply the brain with blood. Allow one hour for this exam. There are no restrictions or special instructions.    Follow-Up: At [facility], you and your health needs are our priority.  As part of our continuing mission to provide you with exceptional heart care, we have created designated Provider Care Teams.  These Care Teams include your primary Cardiologist (physician) and Advanced Practice Providers (APPs -  Physician Assistants and Nurse Practitioners) who all work together to provide you with the care you need, when you need it.  We recommend signing up for the patient portal called "MyChart".  Sign up information is provided on this After Visit Summary.  MyChart is used to connect with patients for Virtual Visits (Telemedicine).  Patients are able to view lab/test results, encounter notes, upcoming appointments, etc.  Non-urgent messages can be sent to your provider as well.   To learn more about what you can do with MyChart, go to [email or web address].    Your next appointment:    APP in 6 months

## 2022-12-16 ENCOUNTER — Ambulatory Visit (HOSPITAL_COMMUNITY)
Admission: RE | Admit: 2022-12-16 | Discharge: 2022-12-16 | Disposition: A | Discharge status: Home / Self Care | Payer: Medicare PPO | Source: Ambulatory Visit | Attending: Cardiology | Admitting: Cardiology

## 2022-12-16 DIAGNOSIS — G459 Transient cerebral ischemic attack, unspecified: Secondary | ICD-10-CM | POA: Diagnosis not present

## 2022-12-16 DIAGNOSIS — R200 Anesthesia of skin: Secondary | ICD-10-CM | POA: Diagnosis not present

## 2022-12-23 ENCOUNTER — Ambulatory Visit: Payer: Medicare PPO | Admitting: Cardiology

## 2022-12-28 ENCOUNTER — Ambulatory Visit: Payer: Medicare PPO

## 2022-12-28 VITALS — Ht 70.0 in | Wt 196.0 lb

## 2022-12-28 DIAGNOSIS — Z0000 Encounter for general adult medical examination without abnormal findings: Secondary | ICD-10-CM | POA: Diagnosis not present

## 2022-12-28 NOTE — Patient Instructions (Signed)
Keith [name] , Thank you for taking time to come for your Medicare Wellness Visit. I appreciate your ongoing commitment to your health goals. Please review the following plan we discussed and let me know if I can assist you in the future.   Referrals/Orders/Follow-Ups/Clinician Recommendations: No  This is a list of the screening recommended for you and due dates:  Health Maintenance  Topic Date Due   Zoster (Shingles) Vaccine (1 of 2) Never done   Flu Shot  09/08/2022   COVID-19 Vaccine (4 - 2023-24 season) 10/09/2022   Medicare Annual Wellness Visit  12/28/2023   DTaP/Tdap/Td vaccine (3 - Td or Tdap) 07/16/2031   Pneumonia Vaccine  Completed   HPV Vaccine  Aged Out    Advanced directives: (Copy Requested) Please bring a copy of your health care power of attorney and living will to the office to be added to your chart at your convenience.  Next Medicare Annual Wellness Visit scheduled for next year: No

## 2022-12-28 NOTE — Progress Notes (Signed)
Subjective:   Keith [name] is a 82 y.o. male who presents for Medicare Annual/Subsequent preventive examination.  Visit Complete: Virtual I connected with  [name] on 12/28/22 by a audio enabled telemedicine application and verified that I am speaking with the correct person using two identifiers.  Patient Location: Home  Provider Location: Office/Clinic  I discussed the limitations of evaluation and management by telemedicine. The patient expressed understanding and agreed to proceed.  Vital Signs: Because this visit was a virtual/telehealth visit, some criteria may be missing or patient reported. Any vitals not documented were not able to be obtained and vitals that have been documented are patient reported.  Cardiac Risk Factors include: advanced age (>20men, >71 women);dyslipidemia;family history of premature cardiovascular disease;male gender;sedentary lifestyle     Objective:    Today's Vitals   12/28/22 1012  Weight: 196 lb (88.9 kg)  Height: 5\' 10"  (1.778 m)  PainSc: 0-No pain   Body mass index is 28.12 kg/m.     12/28/2022   10:14 AM 10/26/2021   11:01 AM 07/15/2021    4:53 PM 07/01/2021    2:05 PM 01/30/2021    1:25 PM 08/04/2020   10:54 AM 06/30/2020    9:52 AM  Advanced Directives  Does Patient Have a Medical Advance Directive? Yes Yes Yes Yes Yes No Yes  Type of Estate agent of [place];Living will Healthcare Power of [place];Living will Healthcare Power of [place];Living will Living will;Healthcare Power of [employer] Power of [place];Living will  Healthcare Power of [place];Living will  Does patient want to make changes to medical advance directive?  No - Patient declined  No - Patient declined   No - Patient declined  Copy of Healthcare Power of Attorney in Chart? No - copy requested No - copy requested  No - copy requested   No - copy requested  Would patient like information on creating a medical advance directive?       No - Patient declined     Current Medications (verified) Outpatient Encounter Medications as of 12/28/2022  Medication Sig   Boswellia-Glucosamine-Vit D (OSTEO BI-FLEX ONE PER DAY PO) Take by mouth.   cyanocobalamin 2000 MCG tablet Take 1 tablet (2,000 mcg total) by mouth daily.   diltiazem (CARDIZEM CD) 120 MG 24 hr capsule Take 1 capsule (120 mg total) by mouth daily.   ferrous sulfate 324 MG TBEC Take 324 mg by mouth daily.   fluticasone (FLONASE) 50 MCG/ACT nasal spray SHAKE LIQUID AND USE 2 SPRAYS IN EACH NOSTRIL DAILY (Patient not taking: Reported on 12/15/2022)   levothyroxine (SYNTHROID) 112 MCG tablet Take 1 tablet (112 mcg total) by mouth daily before breakfast. Annual appt due in Sept must see provider for future refills   LIVALO 1 MG TABS Take 1 tablet (1 mg total) by mouth daily.   magnesium oxide (MAG-OX) 400 MG tablet Take 400 mg by mouth daily.   Misc Natural Products ([employer] PROSTATE MAX + PO) Take 1 tablet by mouth 2 (two) times daily.   rivaroxaban (XARELTO) 20 MG TABS tablet Take 1 tablet (20 mg total) by mouth daily with supper.   No facility-administered encounter medications on file as of 12/28/2022.    Allergies (verified) Atorvastatin and Rosuvastatin   History: Past Medical History:  Diagnosis Date   Allergic rhinitis    Cancer (HCC)    SKIN CANCER ON LIP (30 YR AGO)   DDD (degenerative disc disease), cervical    Hyperlipidemia  Hypothyroidism    PONV (postoperative nausea and vomiting)    2016 BACK SURGERY, IN RECOVERY HE HAD N/V   Past Surgical History:  Procedure Laterality Date   AMPUTATED Right 1961   TRAUMATIC AMPUTATED FINGERS RIGHT    BACK SURGERY  2016   TONSILLECTOMY     TRANSFORAMINAL LUMBAR INTERBODY FUSION (TLIF) WITH PEDICLE SCREW FIXATION 1 LEVEL Right 11/29/2017   Procedure: RIGHT SIDED LUMBAR 4-5 TRANSFORAMINAL LUMBAR INTERBODY FUSION WITH INSTRUMENTATION AND ALLOGRAFT;  Surgeon: Estill Bamberg, MD;  Location: [facility] OR;  Service:  Orthopedics;  Laterality: Right;   Family History  Problem Relation Age of Onset   Diabetes Mother    Coronary artery disease Father 7       Fatal MI   Diabetes Father    Coronary artery disease Other    Breast cancer Other    Social History   Socioeconomic History   Marital status: Married    Spouse name: Not on file   Number of children: Not on file   Years of education: Not on file   Highest education level: Not on file  Occupational History   Occupation: Retired Scientist, research (life sciences): [employer]   Occupation: Physicist, medical bus  Tobacco Use   Smoking status: Never   Smokeless tobacco: Never  Vaping Use   Vaping status: Never Used  Substance and Sexual Activity   Alcohol use: No   Drug use: No   Sexual activity: Not Currently  Other Topics Concern   Not on file  Social History Narrative   Regular Exercise -  YES         Social Determinants of Health   Financial Resource Strain: Low Risk  (12/28/2022)   Overall Financial Resource Strain (CARDIA)    Difficulty of Paying Living Expenses: Not hard at all  Food Insecurity: No Food Insecurity (12/28/2022)   Hunger Vital Sign    Worried About Running Out of Food in the Last Year: Never true    Ran Out of Food in the Last Year: Never true  Transportation Needs: No Transportation Needs (12/28/2022)   PRAPARE - Administrator, Civil Service (Medical): No    Lack of Transportation (Non-Medical): No  Physical Activity: Sufficiently Active (12/28/2022)   Exercise Vital Sign    Days of Exercise per Week: 5 days    Minutes of Exercise per Session: 30 min  Stress: No Stress Concern Present (12/28/2022)   [employer] of Occupational Health - Occupational Stress Questionnaire    Feeling of Stress : Not at all  Social Connections: Socially Integrated (12/28/2022)   Social Connection and Isolation Panel [NHANES]    Frequency of Communication with Friends and Family: More than three times a week     Frequency of Social Gatherings with Friends and Family: More than three times a week    Attends Religious Services: More than 4 times per year    Active Member of [employer] or Organizations: No    Attends Engineer, structural: More than 4 times per year    Marital Status: Married    Tobacco Counseling Counseling given: Not Answered   Clinical Intake:  Pre-visit preparation completed: Yes  Pain : No/denies pain Pain Score: 0-No pain     BMI - recorded: 28.12 Nutritional Status: BMI 25 -29 Overweight Nutritional Risks: None Diabetes: No  How often do you need to have someone help you when you read instructions, pamphlets, or other written materials from your  doctor or pharmacy?: 1 - Never What is the last grade level you completed in school?: HSG  Interpreter Needed?: No  Information entered by :: Amyrah Pinkhasov N. Benino Korinek, LPN.   Activities of Daily Living    12/28/2022   10:18 AM  In your present state of health, do you have any difficulty performing the following activities:  Hearing? 0  Vision? 0  Difficulty concentrating or making decisions? 0  Walking or climbing stairs? 0  Dressing or bathing? 0  Doing errands, shopping? 0  Preparing Food and eating ? N  Using the Toilet? N  In the past six months, have you accidently leaked urine? N  Do you have problems with loss of bowel control? N  Managing your Medications? N  Managing your Finances? N  Housekeeping or managing your Housekeeping? N    Patient Care Team: [name], MD as PCP - Reatha Armour, MD as PCP - Cardiology (Cardiology) Judi Saa, DO as Consulting Physician (Sports Medicine)  Indicate any recent Medical Services you may have received from other than [facility] providers in the past year (date may be approximate).     Assessment:   This is a routine wellness examination for Keith [name].  Hearing/Vision screen Hearing Screening - Comments:: Patient denied any hearing  difficulty.   No hearing aids.  Vision Screening - Comments:: Any vision impairment: Yes, patient wears corrective lenses/contacts/readers. Eye exam done annually by: [facility]    Goals Addressed             This Visit's Progress    Patient Stated       My goal is to stay safe in my home and not to fall.      Depression Screen    12/28/2022   10:15 AM 03/08/2022   11:17 AM 07/13/2021    8:11 AM 07/01/2021    2:10 PM 06/30/2020   10:05 AM 06/06/2019    2:05 PM 11/05/2018    4:08 PM  PHQ 2/9 Scores  PHQ - 2 Score 0 0 0 0 0 0 0  PHQ- 9 Score 0          Fall Risk    12/28/2022   10:15 AM 03/08/2022   11:17 AM 07/01/2021    2:07 PM 06/30/2020   10:06 AM 06/06/2019    2:05 PM  Fall Risk   Falls in the past year? 0 0 1 0 0  Number falls in past yr: 0 0 0 0 0  Injury with Fall? 0 0 0 0 0  Risk for fall due to : No Fall Risks No Fall Risks Orthopedic patient No Fall Risks No Fall Risks  Follow up Falls prevention discussed Falls evaluation completed Falls evaluation completed Falls evaluation completed Falls evaluation completed;Education provided;Falls prevention discussed    MEDICARE RISK AT HOME: Medicare Risk at Home Any stairs in or around the home?: No If so, are there any without handrails?: No Home free of loose throw rugs in walkways, pet beds, electrical cords, etc?: Yes Adequate lighting in your home to reduce risk of falls?: Yes Life alert?: No Use of a cane, walker or w/c?: No Grab bars in the bathroom?: No Shower chair or bench in shower?: No Elevated toilet seat or a handicapped toilet?: No  TIMED UP AND GO:  Was the test performed?  No    Cognitive Function:    12/28/2022   10:18 AM  MMSE - Mini Mental State Exam  Not completed: Unable to  complete        12/28/2022   10:18 AM 07/01/2021    2:18 PM  6CIT Screen  What Year? 0 points 0 points  What month? 0 points 0 points  What time? 0 points 0 points  Count back from 20 0 points 0 points   Months in reverse 0 points 0 points  Repeat phrase 0 points 0 points  Total Score 0 points 0 points    Immunizations Immunization History  Administered Date(s) Administered   Fluad Quad(high Dose 65+) 11/05/2018   Influenza Split 12/29/2010, 11/09/2011, 11/21/2013   Influenza Whole 10/06/2009   Influenza, High Dose Seasonal PF 11/07/2016, 01/01/2018   Influenza,inj,Quad PF,6+ Mos 10/23/2012, 11/04/2014, 10/01/2015   Moderna Covid-19 Vaccine Bivalent Booster 53yrs & up 12/15/2020   Moderna SARS-COV2 Booster Vaccination 01/12/2020   Moderna Sars-Covid-2 Vaccination 03/25/2019, 04/23/2019   Pneumococcal Conjugate-13 03/07/2014   Pneumococcal Polysaccharide-23 10/23/2012, 10/13/2021   Tdap 12/29/2010, 07/15/2021    TDAP status: Up to date  Flu Vaccine status: Up to date  Pneumococcal vaccine status: Up to date  Covid-19 vaccine status: Completed vaccines  Qualifies for Shingles Vaccine? Yes   Zostavax completed No   Shingrix Completed?: No.    Education has been provided regarding the importance of this vaccine. Patient has been advised to call insurance company to determine out of pocket expense if they have not yet received this vaccine. Advised may also receive vaccine at local pharmacy or Health Dept. Verbalized acceptance and understanding.  Screening Tests Health Maintenance  Topic Date Due   Zoster Vaccines- Shingrix (1 of 2) Never done   INFLUENZA VACCINE  09/08/2022   COVID-19 Vaccine (4 - 2023-24 season) 10/09/2022   Medicare Annual Wellness (AWV)  12/28/2023   DTaP/Tdap/Td (3 - Td or Tdap) 07/16/2031   Pneumonia Vaccine 29+ Years old  Completed   HPV VACCINES  Aged Out    Health Maintenance  Health Maintenance Due  Topic Date Due   Zoster Vaccines- Shingrix (1 of 2) Never done   INFLUENZA VACCINE  09/08/2022   COVID-19 Vaccine (4 - 2023-24 season) 10/09/2022    Colorectal cancer screening: No longer required.   Lung Cancer Screening: (Low Dose CT Chest  recommended if Age 27-80 years, 20 pack-year currently smoking OR have quit w/in 15years.) does not qualify.   Lung Cancer Screening Referral: no  Additional Screening:  Hepatitis C Screening: does not qualify; Completed: no  Vision Screening: Recommended annual ophthalmology exams for early detection of glaucoma and other disorders of the eye. Is the patient up to date with their annual eye exam?  Yes  Who is the provider or what is the name of the office in which the patient attends annual eye exams? [facility] If pt is not established with a provider, would they like to be referred to a provider to establish care? No .   Dental Screening: Recommended annual dental exams for proper oral hygiene  Diabetic Foot Exam: N/A  Community Resource Referral / Chronic Care Management: CRR required this visit?  No   CCM required this visit?  No     Plan:     I have personally reviewed and noted the following in the patient's chart:   Medical and social history Use of alcohol, tobacco or illicit drugs  Current medications and supplements including opioid prescriptions. Patient is not currently taking opioid prescriptions. Functional ability and status Nutritional status Physical activity Advanced directives List of other physicians Hospitalizations, surgeries, and ER visits in previous  12 months Vitals Screenings to include cognitive, depression, and falls Referrals and appointments  In addition, I have reviewed and discussed with patient certain preventive protocols, quality metrics, and best practice recommendations. A written personalized care plan for preventive services as well as general preventive health recommendations were provided to patient.     Mickeal Needy, LPN   40/11/2723   After Visit Summary: (MyChart) Due to this being a telephonic visit, the after visit summary with patients personalized plan was offered to patient via MyChart   Nurse Notes:  None

## 2023-01-09 ENCOUNTER — Other Ambulatory Visit: Payer: Self-pay | Admitting: Cardiology

## 2023-01-12 ENCOUNTER — Telehealth: Payer: Self-pay | Admitting: Cardiology

## 2023-01-12 ENCOUNTER — Other Ambulatory Visit: Payer: Self-pay

## 2023-01-12 DIAGNOSIS — I4891 Unspecified atrial fibrillation: Secondary | ICD-10-CM

## 2023-01-12 DIAGNOSIS — G459 Transient cerebral ischemic attack, unspecified: Secondary | ICD-10-CM

## 2023-01-12 MED ORDER — DILTIAZEM HCL ER COATED BEADS 120 MG PO CP24: 120.0000 mg | ORAL_CAPSULE | Freq: Every day | ORAL | 3 refills | Status: DC

## 2023-01-12 NOTE — Telephone Encounter (Addendum)
Patient identification verified by 2 forms. Keith Rail, RN    Called and spoke to patient  Patient states:   -he would like to have implanted monitor   -plans to present to lab on 12/10 Informed patient message sent to Dr. Antoine Poche

## 2023-01-12 NOTE — Telephone Encounter (Signed)
  Patient is calling stating that he would like to revisit the possibility of doing an implant that he discussed with Dr Antoine Poche at his last visit. Please advise.

## 2023-01-13 NOTE — Telephone Encounter (Signed)
Spoke with the patient and the next available date for Loop insertion= 01/26/23 at 1200- the patient agreed to this date and time. Went over the directions below with the patient. He reports that he will be coming to the office on Tuesday 01/17/23 for labs- Informed him that I will leave a copy of the instructions and the CHG scrub for him at the front desk, just to ask for it. He verbalized understanding of all instructions and will pick up scrub 12/10. Told him he will receive a call if his insurance does not approve the procedure.         [facility], [address]  [place], [place] [address]  Phone: [phone] Fax: [phone]   You are scheduled for a Loop Recorder Implant [id number] 01/26/23 at 12:00  This will take place at [address] [facility], [address].    Please arrive at your appointment 15 minutes early.   You do not need to be fasting.    The procedure is performed with local anesthesia. You will not receive sedatives nor will an IV be placed.    Wash your chest and neck with the surgical soap the evening before and the morning of your procedure. Please following the washing instructions provided.    As with all surgical implants, there is a small risk of infection. If an infection occurs, the device will be removed. To help reduce the risk, please use the surgical scrub provided. Additional antiseptic precautions will be taken at the time of the procedure.    Please bring your insurance cards.    You will be scheduled for a two week wound check visit with Dr. Royann Shivers. This will be a video visit. If you are unable to do a video visit then you may come into the office.   *Please note that scheduled loop recorder implants may need to be rescheduled if Dr. Royann Shivers has a procedure urgently added on at the hospital     Preparing for the Procedure   Before the procedure, you can play an important role. Because skin is not sterile, your skin needs to  be as free of germs as possible. You can reduce the number of germs on your skin by washing with CHG (chlorhexidine gluconate) Soap before the procedure. CHG is an antiseptic cleaner which kills germs and bonds with the skin to continue killing germs even after washing.   Please do not use if you have an allergy to CHG or antibacterial soaps. If your skin becomes reddened/irritated, STOP using the CHG.   DO NOT SHAVE (including legs and underarms) for at least 48 hours prior to first CHG shower. It is OK to shave your face.   Please follow these instructions carefully: Shower the night before the procedure and the morning of with CHG Soap. If you chose to wash your hair, wash your hair first as usual with your normal shampoo/conditioner. After you shampoo/condition, rinse you hair and body thoroughly to remove shampoo/conditioner. Use CHG as you would any other liquid soap. You can apply CHG directly to the skin and wash gently with a loofah or a clean washcloth. Apply the CHG Soap to your body ONLY FROM THE NECK DOWN. Do not use on open wounds or open sores. Avoid contact with your eyes, ears, mouth, and genitals (private parts).  Wash thoroughly, paying special attention to the area where your surgery will be performed. Thoroughly rinse your body with warm water from the neck down. DO  NOT shower/wash with your normal soap after using and rinsing off the CHG Soap. Pat yourself dry with a clean towel. Wear clean pajamas to bed. Place clean sheets on your bed the night of your first shower and do not sleep with pets..   Day of Surgery: Shower with the CHG Soap following the instructions listed above. DO NOT apply deodorants or lotions.

## 2023-01-16 ENCOUNTER — Ambulatory Visit: Payer: Medicare PPO | Attending: Cardiovascular Disease | Admitting: Cardiovascular Disease

## 2023-01-16 DIAGNOSIS — I480 Paroxysmal atrial fibrillation: Secondary | ICD-10-CM

## 2023-01-16 NOTE — Telephone Encounter (Signed)
Informed the patient that Dr Royann Shivers would like to do a MyChart visit today sometime. Requirement to be "seen," within 30 days of a procedure. MyChart visit scheduled for 1245 today. Pt verbalized understanding.

## 2023-01-16 NOTE — Progress Notes (Signed)
Virtual Visit via Video Note   Because of Keith [name]'s co-morbid illnesses, he is at least at moderate risk for complications without adequate follow up.  This format is felt to be most appropriate for this patient at this time.  All issues noted in this document were discussed and addressed.  A limited physical exam was performed with this format.  Please refer to the patient's chart for his consent to telehealth for Keith [name].       Date:  01/16/2023   ID:  Keith [name], DOB [birth year], MRN [id number] The patient was identified using 2 identifiers.  Patient Location: Home Provider Location: Office/Clinic   PCP:  [name], MD    [facility] Cardiologist:  Rollene Rotunda, MD     Evaluation Performed:  Follow-Up Visit  Chief Complaint: Atrial fibrillation  History of Present Illness:    Keith [name] is a 82 y.o. male with a single episode of atrial fibrillation and subsequent episodes of brief paroxysmal atrial tachycardia.  Referred for loop recorder implantation by Dr. Antoine Poche since he has been having some sporadic episodes of unilateral numbness and changes in vision.  CHA2DS2-VASc score is 2, based on age alone.  MRI of the brain in 2022 did not show evidence of frank stroke although there were some changes of atrophy and chronic microvascular ischemic changes in the white matter.  He does not have hypertension, diabetes, known CAD or PAD or congestive heart failure.  By echocardiography does not have any significant structural cardiac abnormalities and both atria are normal in size.  He prefers not to take anticoagulation.   Past Medical History:  Diagnosis Date   Allergic rhinitis    Cancer (HCC)    SKIN CANCER ON LIP (30 YR AGO)   DDD (degenerative disc disease), cervical    Hyperlipidemia    Hypothyroidism    PONV (postoperative nausea and vomiting)    2016 BACK SURGERY, IN RECOVERY HE HAD N/V   Past Surgical History:   Procedure Laterality Date   AMPUTATED Right 1961   TRAUMATIC AMPUTATED FINGERS RIGHT    BACK SURGERY  2016   TONSILLECTOMY     TRANSFORAMINAL LUMBAR INTERBODY FUSION (TLIF) WITH PEDICLE SCREW FIXATION 1 LEVEL Right 11/29/2017   Procedure: RIGHT SIDED LUMBAR 4-5 TRANSFORAMINAL LUMBAR INTERBODY FUSION WITH INSTRUMENTATION AND ALLOGRAFT;  Surgeon: Estill Bamberg, MD;  Location: [facility] OR;  Service: Orthopedics;  Laterality: Right;     No outpatient medications have been marked as taking for the 01/16/23 encounter (Appointment) with Alivea Gladson, Rachelle Hora, MD.     Allergies:   Atorvastatin and Rosuvastatin   Social History   Tobacco Use   Smoking status: Never   Smokeless tobacco: Never  Vaping Use   Vaping status: Never Used  Substance Use Topics   Alcohol use: No   Drug use: No     Family Hx: The patient's family history includes Breast cancer in an other family member; Coronary artery disease in an other family member; Coronary artery disease (age of onset: 65) in his father; Diabetes in his father and mother.  ROS:   Please see the history of present illness.     All other systems reviewed and are negative.   Prior CV studies:   The following studies were reviewed today: Echocardiogram and arrhythmia monitor October 2023 and carotid ultrasound from November 2024   Labs/Other Tests and Data Reviewed:    EKG:  An ECG dated 10/13/2021 was personally reviewed  today and demonstrated:  Atrial fibrillation with rapid ventricular response the ECG from 12/15/2022 shows sinus rhythm with premature atrial contractions  Recent Labs: 08/29/2022: ALT 19; BUN 17; Creatinine, Ser 1.06; Hemoglobin 15.6; Platelets 185.0; Potassium 4.0; Sodium 140; TSH 2.27   Recent Lipid Panel Lab Results  Component Value Date/Time   CHOL 133 08/29/2022 11:18 AM   TRIG 148.0 08/29/2022 11:18 AM   HDL 35.30 (L) 08/29/2022 11:18 AM   CHOLHDL 4 08/29/2022 11:18 AM   LDLCALC 68 08/29/2022 11:18 AM   LDLCALC 85  09/04/2019 02:35 PM    Wt Readings from Last 3 Encounters:  12/28/22 196 lb (88.9 kg)  12/15/22 196 lb (88.9 kg)  08/29/22 199 lb (90.3 kg)     Risk Assessment/Calculations:    CHA2DS2-VASc Score = 2   This indicates a 2.2% annual risk of stroke. The patient's score is based upon: CHF History: 0 HTN History: 0 Diabetes History: 0 Stroke History: 0 Vascular Disease History: 0 Age Score: 2 Gender Score: 0         Objective:    Vital Signs:  There were no vitals taken for this visit.     ASSESSMENT & PLAN:    Keith [name] has had a single episode of atrial fibrillation documented on electrocardiogram from 10/13/2021.  He is reluctant to consider anticoagulation.  Would like to confirm that this was not a one-time event and the most reliable way to do this is with an implantable loop recorder. This procedure has been fully reviewed with the patient and written informed consent has been obtained.  Scheduled for this Thursday.        Time:   Today, I have spent 10 minutes with the patient with telehealth technology discussing the above problems.     Medication Adjustments/Labs and Tests Ordered: Current medicines are reviewed at length with the patient today.  Concerns regarding medicines are outlined above.   Tests Ordered: No orders of the defined types were placed in this encounter.   Medication Changes: No orders of the defined types were placed in this encounter.   Follow Up:  In Person  01/19/2023  Signed, Thurmon Fair, MD  01/16/2023 1:16 PM    [facility]

## 2023-01-17 DIAGNOSIS — Z79899 Other long term (current) drug therapy: Secondary | ICD-10-CM | POA: Diagnosis not present

## 2023-01-17 DIAGNOSIS — I4891 Unspecified atrial fibrillation: Secondary | ICD-10-CM | POA: Diagnosis not present

## 2023-01-17 NOTE — Telephone Encounter (Signed)
Patient is requesting to speak with RN again. He states he inquired about whether or not the procedure would be covered by insurance and he was told he will receive a call back, but he hasn't heard back yet. He would like to know if RN has any updates.

## 2023-01-18 ENCOUNTER — Telehealth: Payer: Self-pay | Admitting: Cardiovascular Disease

## 2023-01-18 LAB — CBC
Hematocrit: 48.8 % (ref 37.5–51.0)
Hemoglobin: 15.9 g/dL (ref 13.0–17.7)
MCH: 31.1 pg (ref 26.6–33.0)
MCHC: 32.6 g/dL (ref 31.5–35.7)
MCV: 95 fL (ref 79–97)
Platelets: 170 10*3/uL (ref 150–450)
RBC: 5.12 x10E6/uL (ref 4.14–5.80)
RDW: 13.5 % (ref 11.6–15.4)
WBC: 8.3 10*3/uL (ref 3.4–10.8)

## 2023-01-18 NOTE — Telephone Encounter (Signed)
Patient identification verified by 2 forms. [name], RN    Called and spoke to patient  Informed patient, per appointment note, procedure was authorized  Patient has no further questions at this time

## 2023-01-18 NOTE — Telephone Encounter (Signed)
Patient identification verified by 2 forms. [name], RN    Called and spoke to patient  Informed patient CHG soap and instructions will be available for pick up at front desk  RN reviewed instructions for soap use  Patient verbalized understanding, no questions at this time

## 2023-01-18 NOTE — Telephone Encounter (Signed)
Pt called in asking about the CHG soap he is supposed to use prior to his ILR implant next Thursday.

## 2023-01-23 ENCOUNTER — Encounter: Payer: Self-pay | Admitting: Internal Medicine

## 2023-01-23 ENCOUNTER — Ambulatory Visit: Payer: Medicare PPO | Admitting: Internal Medicine

## 2023-01-23 VITALS — BP 108/64 | HR 60 | Temp 97.6°F | Resp 16 | Ht 70.0 in | Wt 202.8 lb

## 2023-01-23 DIAGNOSIS — E538 Deficiency of other specified B group vitamins: Secondary | ICD-10-CM

## 2023-01-23 DIAGNOSIS — J3489 Other specified disorders of nose and nasal sinuses: Secondary | ICD-10-CM | POA: Diagnosis not present

## 2023-01-23 DIAGNOSIS — H04209 Unspecified epiphora, unspecified lacrimal gland: Secondary | ICD-10-CM

## 2023-01-23 DIAGNOSIS — F4321 Adjustment disorder with depressed mood: Secondary | ICD-10-CM

## 2023-01-23 DIAGNOSIS — E039 Hypothyroidism, unspecified: Secondary | ICD-10-CM

## 2023-01-23 DIAGNOSIS — R739 Hyperglycemia, unspecified: Secondary | ICD-10-CM | POA: Diagnosis not present

## 2023-01-23 LAB — BASIC METABOLIC PANEL
BUN: 15 mg/dL (ref 6–23)
CO2: 27 meq/L (ref 19–32)
Calcium: 8.6 mg/dL (ref 8.4–10.5)
Chloride: 105 meq/L (ref 96–112)
Creatinine, Ser: 1.04 mg/dL (ref 0.40–1.50)
GFR: 67.42 mL/min (ref >60.00)
Glucose, Bld: 114 mg/dL — ABNORMAL HIGH (ref 70–99)
Potassium: 4 meq/L (ref 3.5–5.1)
Sodium: 140 meq/L (ref 135–145)

## 2023-01-23 LAB — CBC WITH DIFFERENTIAL/PLATELET
Basophils Absolute: 0.1 10*3/uL (ref 0.0–0.1)
Basophils Relative: 1 % (ref 0.0–3.0)
Eosinophils Absolute: 0.1 10*3/uL (ref 0.0–0.7)
Eosinophils Relative: 1.3 % (ref 0.0–5.0)
HCT: 48.4 % (ref 39.0–52.0)
Hemoglobin: 15.9 g/dL (ref 13.0–17.0)
Lymphocytes Relative: 21.6 % (ref 12.0–46.0)
Lymphs Abs: 1.9 10*3/uL (ref 0.7–4.0)
MCHC: 32.8 g/dL (ref 30.0–36.0)
MCV: 96 fL (ref 78.0–100.0)
Monocytes Absolute: 0.8 10*3/uL (ref 0.1–1.0)
Monocytes Relative: 9 % (ref 3.0–12.0)
Neutro Abs: 5.9 10*3/uL (ref 1.4–7.7)
Neutrophils Relative %: 67.1 % (ref 43.0–77.0)
Platelets: 172 10*3/uL (ref 150.0–400.0)
RBC: 5.05 Mil/uL (ref 4.22–5.81)
RDW: 14.8 % (ref 11.5–15.5)
WBC: 8.8 10*3/uL (ref 4.0–10.5)

## 2023-01-23 LAB — HEMOGLOBIN A1C: Hgb A1c MFr Bld: 6.2 % (ref 4.6–6.5)

## 2023-01-23 LAB — TSH: TSH: 4.34 u[IU]/mL (ref 0.35–5.50)

## 2023-01-23 LAB — VITAMIN B12: Vitamin B-12: 1424 pg/mL — ABNORMAL HIGH (ref 211–911)

## 2023-01-23 LAB — FOLATE: Folate: 20.7 ng/mL (ref >5.9)

## 2023-01-23 MED ORDER — AZELASTINE HCL 0.1 % NA SOLN: 2.0000 | Freq: Two times a day (BID) | NASAL | 1 refills | Status: AC

## 2023-01-23 NOTE — Patient Instructions (Signed)

## 2023-01-23 NOTE — Progress Notes (Unsigned)
Subjective:  Patient ID: Keith [name], male    DOB: [birth year]  Age: 82 y.o. MRN: [id number]  CC: Hypothyroidism   HPI Keith [name] presents for f/up ----  Discussed the use of AI scribe software for clinical note transcription with the patient, who gave verbal consent to proceed.  History of Present Illness   The patient, with a history of atrial fibrillation, reports experiencing brief episodes of visual disturbances while driving, lasting approximately six to eight seconds. He denies any associated dizziness or lightheadedness.  The patient also reports a recent weight gain of about five pounds, attributing it to overeating. He denies any other symptoms of thyroid dysfunction.  The patient has been experiencing grief following the loss of his spouse in September. He attended a Hospice grief meeting and found it helpful, but he occasionally still experiences emotional spells. He denies the need for further therapeutic intervention at this time.  The patient also reports chronic watery eyes for the past four to five years. He has tried over-the-counter remedies without significant relief. His last eye exam was approximately one to two years ago.       Outpatient Medications Prior to Visit  Medication Sig Dispense Refill   Boswellia-Glucosamine-Vit D (OSTEO BI-FLEX ONE PER DAY PO) Take by mouth.     cyanocobalamin 2000 MCG tablet Take 1 tablet (2,000 mcg total) by mouth daily. 90 tablet 1   diltiazem (CARDIZEM CD) 120 MG 24 hr capsule Take 1 capsule (120 mg total) by mouth daily. 90 capsule 3   ferrous sulfate 324 MG TBEC Take 324 mg by mouth daily.     fluticasone (FLONASE) 50 MCG/ACT nasal spray SHAKE LIQUID AND USE 2 SPRAYS IN EACH NOSTRIL DAILY (Patient taking differently: Place 1 spray into both nostrils as needed for allergies.) 48 g 1   levothyroxine (SYNTHROID) 112 MCG tablet Take 1 tablet (112 mcg total) by mouth daily before breakfast. Annual appt due in Sept must see  provider for future refills 90 tablet 1   LIVALO 1 MG TABS Take 1 tablet (1 mg total) by mouth daily. 90 tablet 1   magnesium oxide (MAG-OX) 400 MG tablet Take 400 mg by mouth daily.     Misc Natural Products ([employer] PROSTATE MAX + PO) Take 1 tablet by mouth 2 (two) times daily.     rivaroxaban (XARELTO) 20 MG TABS tablet Take 1 tablet (20 mg total) by mouth daily with supper. 30 tablet 11   No facility-administered medications prior to visit.    ROS Review of Systems  Objective:  BP 100/66 (BP Location: Left Arm, Patient Position: Sitting, Cuff Size: Normal)   Pulse 60   Temp 97.6 F (36.4 C) (Oral)   Ht 5\' 10"  (1.778 m)   Wt 202 lb 12.8 oz (92 kg)   SpO2 95%   BMI 29.10 kg/m   BP Readings from Last 3 Encounters:  01/23/23 100/66  12/15/22 112/70  11/22/22 (!) 153/98    Wt Readings from Last 3 Encounters:  01/23/23 202 lb 12.8 oz (92 kg)  12/28/22 196 lb (88.9 kg)  12/15/22 196 lb (88.9 kg)    Physical Exam Cardiovascular:     Rate and Rhythm: Normal rate. Rhythm irregularly irregular.     Heart sounds: Normal heart sounds, S1 normal and S2 normal. No murmur heard. Musculoskeletal:     Right lower leg: No edema.     Left lower leg: No edema.  Psychiatric:  Attention and Perception: Attention and perception normal.        Mood and Affect: Mood is depressed. Mood is not anxious. Affect is tearful. Affect is not blunt or angry.        Speech: Speech normal.        Behavior: Behavior normal.        Thought Content: Thought content normal.        Cognition and Memory: Cognition normal.     Lab Results  Component Value Date   WBC 8.3 01/17/2023   HGB 15.9 01/17/2023   HCT 48.8 01/17/2023   PLT 170 01/17/2023   GLUCOSE 119 (H) 08/29/2022   CHOL 133 08/29/2022   TRIG 148.0 08/29/2022   HDL 35.30 (L) 08/29/2022   LDLCALC 68 08/29/2022   ALT 19 08/29/2022   AST 18 08/29/2022   NA 140 08/29/2022   K 4.0 08/29/2022   CL 105 08/29/2022   CREATININE 1.06  08/29/2022   BUN 17 08/29/2022   CO2 27 08/29/2022   TSH 2.27 08/29/2022   PSA 3.28 03/16/2021   INR 1.01 11/27/2017   HGBA1C 6.0 08/29/2022    VAS US CAROTID Result Date: 12/17/2022 Carotid Arterial Duplex Study Patient Name:  CARLA JANICKE Capri  Date of Exam:   12/16/2022 Medical Rec #: [id number]       Accession #:    [id number] Date of Birth: Feb 02, 1941       Patient Gender: M Patient Age:   17 years Exam Location:  [facility] Procedure:      VAS US CAROTID Referring Phys: Rollene Rotunda --------------------------------------------------------------------------------  Indications:       Numbness and patient reports for the past one year he has                    been having intermittent right-sided facial numbness,                    dizziness and blurred vision. He reports symptoms seems to                    have gotten a little worse in the past 2-3 months. He denies                    any other cerebrovascular symptoms. Risk Factors:      Hyperlipidemia, no history of smoking. Comparison Study:  NA Performing Technologist: Tyna Jaksch RVT  Examination Guidelines: A complete evaluation includes B-mode imaging, spectral Doppler, color Doppler, and power Doppler as needed of all accessible portions of each vessel. Bilateral testing is considered an integral part of a complete examination. Limited examinations for reoccurring indications may be performed as noted.  Right Carotid Findings: +----------+--------+--------+--------+----------------------+--------+           PSV cm/sEDV cm/sStenosisPlaque Description    Comments +----------+--------+--------+--------+----------------------+--------+ CCA Prox  61      10                                             +----------+--------+--------+--------+----------------------+--------+ CCA Distal51      14                                             +----------+--------+--------+--------+----------------------+--------+ ICA  Prox  39      11       Normal  heterogenous and focal         +----------+--------+--------+--------+----------------------+--------+ ICA Distal33      13                                             +----------+--------+--------+--------+----------------------+--------+ ECA       69      9                                              +----------+--------+--------+--------+----------------------+--------+ +----------+--------+-------+----------------+-------------------+           PSV cm/sEDV cmsDescribe        Arm Pressure (mmHG) +----------+--------+-------+----------------+-------------------+ [id number]             Multiphasic, [id number]                 +----------+--------+-------+----------------+-------------------+ +---------+--------+--+--------+--+---------+ VertebralPSV cm/s36EDV cm/s14Antegrade +---------+--------+--+--------+--+---------+  Left Carotid Findings: +----------+--------+--------+--------+----------------------+--------+           PSV cm/sEDV cm/sStenosisPlaque Description    Comments +----------+--------+--------+--------+----------------------+--------+ CCA Prox  60      16                                             +----------+--------+--------+--------+----------------------+--------+ CCA Distal45      15                                             +----------+--------+--------+--------+----------------------+--------+ ICA Prox  53      12      Normal  heterogenous and focal         +----------+--------+--------+--------+----------------------+--------+ ICA Distal58      26                                             +----------+--------+--------+--------+----------------------+--------+ ECA       71      13                                             +----------+--------+--------+--------+----------------------+--------+ +----------+--------+--------+----------------+-------------------+           PSV cm/sEDV cm/sDescribe         Arm Pressure (mmHG) +----------+--------+--------+----------------+-------------------+ [id number]              Multiphasic, [id number]                 +----------+--------+--------+----------------+-------------------+ +---------+--------+--+--------+--+---------+ VertebralPSV cm/s36EDV cm/s13Antegrade +---------+--------+--+--------+--+---------+   Summary: Right Carotid: There is no evidence of stenosis in the right ICA. The                extracranial vessels were near-normal with only minimal wall                thickening or plaque. Left Carotid:  There is no evidence of stenosis in the left ICA. The extracranial               vessels were near-normal with only minimal wall thickening or               plaque. Vertebrals:  Bilateral vertebral arteries demonstrate antegrade flow. Subclavians: Normal flow hemodynamics were seen in bilateral subclavian              arteries. *See table(s) above for measurements and observations.  Electronically signed by Julien Nordmann MD on 12/17/2022 at 10:04:55 PM.    Final     Assessment & Plan:  Nasal discharge with watery eyes -     Azelastine HCl; Place 2 sprays into both nostrils 2 (two) times daily. Use in each nostril as directed  Dispense: 90 mL; Refill: 1  B12 nutritional deficiency -     CBC with Differential/Platelet; Future -     Folate; Future -     Vitamin B12; Future  Acquired hypothyroidism -     TSH; Future  Chronic hyperglycemia -     Hemoglobin A1c; Future -     Basic metabolic panel; Future     Follow-up: Return in about 6 months (around 07/24/2023).  Sanda Linger, MD

## 2023-01-25 MED ORDER — LEVOTHYROXINE SODIUM 112 MCG PO TABS: 112.0000 ug | ORAL_TABLET | Freq: Every day | ORAL | 1 refills | Status: DC

## 2023-01-26 ENCOUNTER — Ambulatory Visit: Payer: Medicare PPO | Attending: Cardiovascular Disease | Admitting: Cardiovascular Disease

## 2023-01-26 DIAGNOSIS — G459 Transient cerebral ischemic attack, unspecified: Secondary | ICD-10-CM

## 2023-01-26 DIAGNOSIS — Z95818 Presence of other cardiac implants and grafts: Secondary | ICD-10-CM

## 2023-01-26 MED ORDER — LIDOCAINE-EPINEPHRINE 1 %-1:100000 IJ SOLN
10.0000 mL | Freq: Once | INTRAMUSCULAR | Status: AC
  Administered 2023-01-26: 10 mL via INTRADERMAL

## 2023-01-26 NOTE — Patient Instructions (Addendum)
  Implantable Loop Recorder Placement, Care After This sheet gives you information about how to care for yourself after your procedure. Your health care provider may also give you more specific instructions. If you have problems or questions, contact your health care provider. What can I expect after the procedure? After the procedure, it is common to have: Soreness or discomfort near the incision. Some swelling or bruising near the incision.  Follow these instructions at home: Incision care  Monitor your cardiac device site for redness, swelling, and drainage. Call the device clinic at [phone] if you experience these symptoms or fever/chills.  Keep the large square bandage on your site for 24 hours and then you may remove it yourself. Keep the steri-strips underneath in place.   You may shower after 72 hours / 3 days from your procedure with the steri-strips in place. They will usually fall off on their own, or may be removed after 10 days. Pat dry.   Avoid lotions, ointments, or perfumes over your incision until it is well-healed.  Please do not submerge in water until your site is completely healed.   Your device is MRI compatible.   Remote monitoring is used to monitor your cardiac device from home. This monitoring is scheduled every month by our office. It allows [place] to keep an eye on the function of your device to ensure it is working properly.  If your wound site starts to bleed apply pressure.    For help with the monitor please call [employer] Monitor Support Specialist directly at [phone].    If you have any questions/concerns please call the device clinic at [phone].  Activity  Return to your normal activities.  General instructions Follow instructions from your health care provider about how to manage your implantable loop recorder and transmit the information. Learn how to activate a recording if this is necessary for your type of device. You may go  through a metal detection gate, and you may let someone hold a metal detector over your chest. Show your ID card if needed. Do not have an MRI unless you check with your health care provider first. Take over-the-counter and prescription medicines only as told by your health care provider. Keep all follow-up visits as told by your health care provider. This is important. Contact a health care provider if: You have redness, swelling, or pain around your incision. You have a fever. You have pain that is not relieved by your pain medicine. You have triggered your device because of fainting (syncope) or because of a heartbeat that feels like it is racing, slow, fluttering, or skipping (palpitations). Get help right away if you have: Chest pain. Difficulty breathing. Summary After the procedure, it is common to have soreness or discomfort near the incision. Change your dressing as told by your health care provider. Follow instructions from your health care provider about how to manage your implantable loop recorder and transmit the information. Keep all follow-up visits as told by your health care provider. This is important. This information is not intended to replace advice given to you by your health care provider. Make sure you discuss any questions you have with your health care provider. Document Released: 01/05/2015 Document Revised: 03/11/2017 Document Reviewed: 03/11/2017 Elsevier Patient Education  2020 [employer]vier Inc.    Wound check visit will be on Friday 02/03/23 at 10:00= MyChart Visit

## 2023-01-26 NOTE — Progress Notes (Signed)
LOOP RECORDER IMPLANT   Procedure report  Procedure performed:  Loop recorder implantation   Reason for procedure:  1. Cryptogenic stroke Procedure performed by:  Thurmon Fair, MD  Complications:  None  Estimated blood loss:  <5 mL  Medications administered during procedure:  Lidocaine 1% with 1/100,000 epinephrine 10 mL locally Device details:  Medtronic Reveal Linq model number [id number], serial number [id number] Procedure details:  After the risks and benefits of the procedure were discussed the patient provided informed consent. Site was marked and identification time out was performed. The patient was prepped and draped in usual sterile fashion. Local anesthesia was administered to an area 2 cm to the left of the sternum in the 4th intercostal space. A cutaneous incision was made using the incision tool. The introducer was then used to create a subcutaneous tunnel and carefully deploy the device. Local pressure was held to ensure hemostasis.  The incision was closed with SteriStrips and a sterile dressing was applied.  R waves 0.38 mV.  Thurmon Fair, MD, [facility] [phone] office [phone] pager 01/26/2023 12:38 PM

## 2023-01-27 ENCOUNTER — Encounter: Payer: Self-pay | Admitting: *Deleted

## 2023-02-03 ENCOUNTER — Ambulatory Visit: Payer: Medicare PPO | Attending: Cardiovascular Disease | Admitting: Cardiovascular Disease

## 2023-02-03 ENCOUNTER — Telehealth: Payer: Self-pay

## 2023-02-03 ENCOUNTER — Encounter: Payer: Self-pay | Admitting: Cardiovascular Disease

## 2023-02-03 VITALS — Ht 70.0 in | Wt 202.0 lb

## 2023-02-03 DIAGNOSIS — Z95818 Presence of other cardiac implants and grafts: Secondary | ICD-10-CM

## 2023-02-03 NOTE — Telephone Encounter (Signed)
  Patient Consent for Virtual Visit        Keith [name] has provided verbal consent on 02/03/2023 for a virtual visit (video or telephone).   CONSENT FOR VIRTUAL VISIT FOR:  Keith [name]  By participating in this virtual visit I agree to the following:  I hereby voluntarily request, consent and authorize [facility] and its employed or contracted physicians, physician assistants, nurse practitioners or other licensed health care professionals (the Practitioner), to provide me with telemedicine health care services (the "Services") as deemed necessary by the treating Practitioner. I acknowledge and consent to receive the Services by the Practitioner via telemedicine. I understand that the telemedicine visit will involve communicating with the Practitioner through live audiovisual communication technology and the disclosure of certain medical information by electronic transmission. I acknowledge that I have been given the opportunity to request an in-person assessment or other available alternative prior to the telemedicine visit and am voluntarily participating in the telemedicine visit.  I understand that I have the right to withhold or withdraw my consent to the use of telemedicine in the course of my care at any time, without affecting my right to future care or treatment, and that the Practitioner or I may terminate the telemedicine visit at any time. I understand that I have the right to inspect all information obtained and/or recorded in the course of the telemedicine visit and may receive copies of available information for a reasonable fee.  I understand that some of the potential risks of receiving the Services via telemedicine include:  Delay or interruption in medical evaluation due to technological equipment failure or disruption; Information transmitted may not be sufficient (e.g. poor resolution of images) to allow for appropriate medical decision making by the  Practitioner; and/or  In rare instances, security protocols could fail, causing a breach of personal health information.  Furthermore, I acknowledge that it is my responsibility to provide information about my medical history, conditions and care that is complete and accurate to the best of my ability. I acknowledge that Practitioner's advice, recommendations, and/or decision may be based on factors not within their control, such as incomplete or inaccurate data provided by me or distortions of diagnostic images or specimens that may result from electronic transmissions. I understand that the practice of medicine is not an exact science and that Practitioner makes no warranties or guarantees regarding treatment outcomes. I acknowledge that a copy of this consent can be made available to me via my patient portal [facility] MyChart), or I can request a printed copy by calling the office of [facility].    I understand that my insurance will be billed for this visit.   I have read or had this consent read to me. I understand the contents of this consent, which adequately explains the benefits and risks of the Services being provided via telemedicine.  I have been provided ample opportunity to ask questions regarding this consent and the Services and have had my questions answered to my satisfaction. I give my informed consent for the services to be provided through the use of telemedicine in my medical care

## 2023-02-03 NOTE — Progress Notes (Signed)
Video visit for follow-up after loop recorder implantation.  The site has healed well without any evidence of swelling or drainage.  No suspicion for infection.  The Steri-Strips are still in place but look ready to come off.  I think he can now remove them next time he takes a shower.  Enrolled in remote follow-up.

## 2023-02-06 ENCOUNTER — Telehealth: Payer: Self-pay

## 2023-02-06 NOTE — Telephone Encounter (Signed)
Alert received from [employer]:  Alert remote transmission: AF 1 AF x 11 hr 53 min on 12/28. On OAC per EMR.  Implanted for Cryptogenic stroke.  Hx of PAF.  On OAC per EMR. Alerts adusted to AF management indication per protocol.  Implanted on 12/19.  Sending for awareness.

## 2023-02-13 ENCOUNTER — Ambulatory Visit: Payer: Medicare PPO | Admitting: Cardiology

## 2023-02-19 NOTE — Telephone Encounter (Signed)
 12 hour episode of paroxysmal atrial fibrillation with good rate control. Confirms that he should be on lifelong anticoagulation for stroke prevention.

## 2023-03-03 ENCOUNTER — Ambulatory Visit (INDEPENDENT_AMBULATORY_CARE_PROVIDER_SITE_OTHER): Payer: Medicare PPO

## 2023-03-03 DIAGNOSIS — I4891 Unspecified atrial fibrillation: Secondary | ICD-10-CM

## 2023-03-06 ENCOUNTER — Other Ambulatory Visit: Payer: Self-pay | Admitting: Internal Medicine

## 2023-03-06 DIAGNOSIS — E785 Hyperlipidemia, unspecified: Secondary | ICD-10-CM

## 2023-03-06 LAB — CUP PACEART REMOTE DEVICE CHECK
Date Time Interrogation Session: 20250124154518
Implantable Pulse Generator Implant Date: 20241219

## 2023-03-11 ENCOUNTER — Encounter: Payer: Self-pay | Admitting: Cardiovascular Disease

## 2023-04-03 ENCOUNTER — Other Ambulatory Visit: Payer: Self-pay | Admitting: Internal Medicine

## 2023-04-03 MED ORDER — DILTIAZEM HCL ER COATED BEADS 120 MG PO CP24: 120.0000 mg | ORAL_CAPSULE | Freq: Every day | ORAL | 0 refills | Status: DC

## 2023-04-03 NOTE — Telephone Encounter (Signed)
 Last Fill: 01/12/23  Last OV: 01/23/23 Next OV: 04/26/23  Routing to provider for review/authorization.

## 2023-04-03 NOTE — Telephone Encounter (Signed)
 Copied from CRM [id number]. Topic: Clinical - Medication Refill >> Apr 03, 2023  9:19 AM Kathryne Eriksson wrote: Most Recent Primary Care Visit:  Provider: [name]  Department: [facility]  Visit Type: OFFICE VISIT  Date: 01/23/2023  Medication: diltiazem (CARDIZEM CD) 120 MG 24 hr capsule  Has the patient contacted their pharmacy? Yes (Agent: If no, request that the patient contact the pharmacy for the refill. If patient does not wish to contact the pharmacy document the reason why and proceed with request.) (Agent: If yes, when and what did the pharmacy advise?)  Is this the correct pharmacy for this prescription? Yes If no, delete pharmacy and type the correct one.  This is the patient's preferred pharmacy:  [id number] [place], [place] - [address] AT [facility] [address] [place] [address] Phone: [phone] Fax: [phone]   Has the prescription been filled recently? No  Is the patient out of the medication? Yes  Has the patient been seen for an appointment in the last year OR does the patient have an upcoming appointment? Yes  Can we respond through MyChart? Yes  Agent: Please be advised that Rx refills may take up to 3 business days. We ask that you follow-up with your pharmacy.

## 2023-04-07 ENCOUNTER — Ambulatory Visit (INDEPENDENT_AMBULATORY_CARE_PROVIDER_SITE_OTHER): Payer: Medicare PPO

## 2023-04-07 DIAGNOSIS — I4891 Unspecified atrial fibrillation: Secondary | ICD-10-CM

## 2023-04-10 LAB — CUP PACEART REMOTE DEVICE CHECK
Date Time Interrogation Session: 20250228154513
Implantable Pulse Generator Implant Date: 20241219

## 2023-04-11 ENCOUNTER — Encounter: Payer: Self-pay | Admitting: Cardiovascular Disease

## 2023-04-12 NOTE — Progress Notes (Signed)
 Carelink Summary Report / Loop Recorder

## 2023-04-12 NOTE — Addendum Note (Signed)
 Addended by: Elease Etienne A on: 04/12/2023 10:21 AM   Modules accepted: Orders

## 2023-04-19 ENCOUNTER — Telehealth: Payer: Self-pay | Admitting: Family Medicine

## 2023-04-19 ENCOUNTER — Other Ambulatory Visit: Payer: Self-pay

## 2023-04-19 DIAGNOSIS — M5416 Radiculopathy, lumbar region: Secondary | ICD-10-CM

## 2023-04-19 NOTE — Telephone Encounter (Signed)
 Patient called asking if Dr Keith [name] would be able to order another epidural injection for him?

## 2023-04-20 ENCOUNTER — Telehealth: Payer: Self-pay | Admitting: *Deleted

## 2023-04-20 NOTE — Telephone Encounter (Signed)
   Pre-operative Risk Assessment    Patient Name: Keith [name]  DOB: 04-21-1940 MRN: [id number]   Date of last office visit: 01/26/23 DR. CROITROU Date of next office visit: 06/30/23 DR. HOCHREIN   Request for Surgical Clearance    Procedure:   LUMBAR EPIDURAL  Date of Surgery:  Clearance TBD                                Surgeon: NOT LISTED Surgeon's Group or Practice Name:  [facility] Phone number:  [phone] Fax number:  [phone]   Type of Clearance Requested:   - Pharmacy:  Hold Rivaroxaban (Xarelto) x 2 DOSES   Type of Anesthesia:  Not Indicated   Additional requests/questions:    Elpidio Anis   04/20/2023, 5:27 PM

## 2023-04-21 NOTE — Telephone Encounter (Signed)
 Patient with diagnosis of atrial fibrillation on Xarelto for anticoagulation.    Procedure:   LUMBAR EPIDURAL   Date of Surgery:  Clearance TBD    CHA2DS2-VASc Score = 2   This indicates a 2.2% annual risk of stroke. The patient's score is based upon: CHF History: 0 HTN History: 0 Diabetes History: 0 Stroke History: 0 Vascular Disease History: 0 Age Score: 2 Gender Score: 0   CrCl 72 Platelet count 172  Per office protocol, patient can hold Xarelto for 3 days prior to procedure.   Patient will not need bridging with Lovenox (enoxaparin) around procedure.  **This guidance is not considered finalized until pre-operative APP has relayed final recommendations.**

## 2023-04-21 NOTE — Telephone Encounter (Signed)
   Patient Name: Keith [name]  DOB: 10-Apr-1940 MRN: [id number]  Primary Cardiologist: Rollene Rotunda, MD  Clinical pharmacists have reviewed the patient's past medical history, labs, and current medications as part of preoperative protocol coverage. The following recommendations have been made:  Patient with diagnosis of atrial fibrillation on Xarelto for anticoagulation.     Procedure:   LUMBAR EPIDURAL   Date of Surgery:  Clearance TBD      CHA2DS2-VASc Score = 2  This indicates a 2.2% annual risk of stroke. The patient's score is based upon: CHF History: 0 HTN History: 0 Diabetes History: 0 Stroke History: 0 Vascular Disease History: 0 Age Score: 2 Gender Score: 0     CrCl 72 Platelet count 172   Per office protocol, patient can hold Xarelto for 3 days prior to procedure.   Patient will not need bridging with Lovenox (enoxaparin) around procedure. Please resume Xarelto as soon as possible postprocedure, at the discretion of the surgeon.     I will route this recommendation to the requesting party via Epic fax function and remove from pre-op pool.  Please call with questions.  Joylene Grapes, NP 04/21/2023, 10:42 AM

## 2023-04-26 ENCOUNTER — Encounter: Payer: Self-pay | Admitting: Internal Medicine

## 2023-04-26 ENCOUNTER — Ambulatory Visit: Payer: Medicare PPO | Admitting: Internal Medicine

## 2023-04-26 VITALS — BP 120/86 | HR 80 | Temp 97.1°F | Ht 70.0 in | Wt 204.6 lb

## 2023-04-26 DIAGNOSIS — I4891 Unspecified atrial fibrillation: Secondary | ICD-10-CM

## 2023-04-26 DIAGNOSIS — E039 Hypothyroidism, unspecified: Secondary | ICD-10-CM

## 2023-04-26 NOTE — Patient Instructions (Signed)

## 2023-04-26 NOTE — Progress Notes (Addendum)
 Subjective:  Patient ID: Keith [name], male    DOB: 1941-02-03  Age: 83 y.o. MRN: [id number]  CC: Hypothyroidism   HPI Keith [name] presents for f/up ----  Discussed the use of AI scribe software for clinical note transcription with the patient, who gave verbal consent to proceed.  History of Present Illness   Keith [name] is an 83 year old male with atrial fibrillation who presents for follow-up regarding his back pain and constipation.  He has ongoing back pain for which he receives an epidural injection every six months. He is currently awaiting coordination with his cardiologist to temporarily discontinue his blood thinner, Exert, in preparation for his upcoming epidural shot. The procedure is typically performed by Dr. Katrinka Blazing.  He has been experiencing constipation over the past month, with bowel movements occurring every four to five days. He took medication yesterday which alleviated the constipation, and he feels better now. No significant weight gain, with a current weight of 204 pounds, which he considers acceptable. He sometimes feels bloated and attributes this to his eating habits at home. He does not believe his thyroid medication dose is too low, despite the recent constipation.  He has a history of atrial fibrillation and mentions experiencing visual disturbances, described as his eyes getting 'a little ribly' for about six to eight seconds, approximately one to two weeks ago. He associates these episodes with his atrial fibrillation. No current palpitations or irregular heartbeats and he has an implantable loop recorder in place to monitor his heart rhythm.  No bleeding, bruising, or recent illnesses despite exposure to others with colds. He confirms receiving a flu shot annually.       Outpatient Medications Prior to Visit  Medication Sig Dispense Refill   azelastine (ASTELIN) 0.1 % nasal spray Place 2 sprays into both nostrils 2 (two) times daily. Use in each  nostril as directed 90 mL 1   Boswellia-Glucosamine-Vit D (OSTEO BI-FLEX ONE PER DAY PO) Take by mouth.     diltiazem (CARDIZEM CD) 120 MG 24 hr capsule Take 1 capsule (120 mg total) by mouth daily. 90 capsule 0   ferrous sulfate 324 MG TBEC Take 324 mg by mouth daily.     fluticasone (FLONASE) 50 MCG/ACT nasal spray SHAKE LIQUID AND USE 2 SPRAYS IN EACH NOSTRIL DAILY (Patient taking differently: Place 1 spray into both nostrils as needed for allergies.) 48 g 1   levothyroxine (SYNTHROID) 112 MCG tablet Take 1 tablet (112 mcg total) by mouth daily before breakfast. Annual appt due in Sept must see provider for future refills 90 tablet 1   magnesium oxide (MAG-OX) 400 MG tablet Take 400 mg by mouth daily.     Misc Natural Products ([employer] PROSTATE MAX + PO) Take 1 tablet by mouth 2 (two) times daily.     Pitavastatin Calcium 1 MG TABS TAKE 1 TABLET(1 MG) BY MOUTH DAILY 90 tablet 1   rivaroxaban (XARELTO) 20 MG TABS tablet Take 1 tablet (20 mg total) by mouth daily with supper. 30 tablet 11   No facility-administered medications prior to visit.    ROS Review of Systems  Constitutional: Negative.  Negative for chills, diaphoresis and fatigue.  HENT: Negative.    Eyes: Negative.   Respiratory: Negative.  Negative for cough, chest tightness and wheezing.   Cardiovascular:  Negative for chest pain, palpitations and leg swelling.  Gastrointestinal:  Positive for constipation. Negative for abdominal pain, diarrhea and nausea.  Genitourinary:  Negative for difficulty urinating.  Musculoskeletal:  Positive for back pain. Negative for myalgias.  Neurological: Negative.  Negative for dizziness and weakness.  Hematological:  Negative for adenopathy. Does not bruise/bleed easily.  Psychiatric/Behavioral: Negative.      Objective:  BP 120/86 (BP Location: Right Arm, Patient Position: Sitting, Cuff Size: Large)   Pulse 80   Temp (!) 97.1 F (36.2 C) (Oral)   Ht 5\' 10"  (1.778 m)   Wt 204 lb 9.6 oz  (92.8 kg)   SpO2 99%   BMI 29.36 kg/m   BP Readings from Last 3 Encounters:  04/26/23 120/86  01/23/23 108/64  12/15/22 112/70    Wt Readings from Last 3 Encounters:  04/26/23 204 lb 9.6 oz (92.8 kg)  02/03/23 202 lb (91.6 kg)  01/23/23 202 lb 12.8 oz (92 kg)    Physical Exam Vitals reviewed.  Constitutional:      Appearance: Normal appearance.  HENT:     Nose: Nose normal.     Mouth/Throat:     Mouth: Mucous membranes are moist.  Eyes:     General: No scleral icterus.    Conjunctiva/sclera: Conjunctivae normal.  Cardiovascular:     Rate and Rhythm: Normal rate and regular rhythm.     Heart sounds: No murmur heard. Pulmonary:     Effort: Pulmonary effort is normal.     Breath sounds: No stridor. Examination of the right-lower field reveals rales. Examination of the left-lower field reveals rales. Rales present. No wheezing or rhonchi.  Abdominal:     Palpations: There is no mass.     Tenderness: There is no abdominal tenderness. There is no guarding.     Hernia: No hernia is present.  Musculoskeletal:        General: Normal range of motion.     Cervical back: Neck supple.     Right lower leg: No edema.     Left lower leg: No edema.  Lymphadenopathy:     Cervical: No cervical adenopathy.  Skin:    General: Skin is warm and dry.  Neurological:     General: No focal deficit present.     Mental Status: He is alert.  Psychiatric:        Mood and Affect: Mood normal.        Behavior: Behavior normal.     Lab Results  Component Value Date   WBC 8.8 01/23/2023   HGB 15.9 01/23/2023   HCT 48.4 01/23/2023   PLT 172.0 01/23/2023   GLUCOSE 114 (H) 01/23/2023   CHOL 133 08/29/2022   TRIG 148.0 08/29/2022   HDL 35.30 (L) 08/29/2022   LDLCALC 68 08/29/2022   ALT 19 08/29/2022   AST 18 08/29/2022   NA 140 01/23/2023   K 4.0 01/23/2023   CL 105 01/23/2023   CREATININE 1.04 01/23/2023   BUN 15 01/23/2023   CO2 27 01/23/2023   TSH 4.34 01/23/2023   PSA 3.28  03/16/2021   INR 1.01 11/27/2017   HGBA1C 6.2 01/23/2023    VAS US CAROTID Result Date: 12/17/2022 Carotid Arterial Duplex Study Patient Name:  Keith [name]  Date of Exam:   12/16/2022 Medical Rec #: [id number]       Accession #:    [id number] Date of Birth: Jul 05, 1940       Patient Gender: M Patient Age:   22 years Exam Location:  [facility] Procedure:      VAS US CAROTID Referring Phys: Rollene Rotunda --------------------------------------------------------------------------------  Indications:       Numbness and  patient reports for the past one year he has                    been having intermittent right-sided facial numbness,                    dizziness and blurred vision. He reports symptoms seems to                    have gotten a little worse in the past 2-3 months. He denies                    any other cerebrovascular symptoms. Risk Factors:      Hyperlipidemia, no history of smoking. Comparison Study:  NA Performing Technologist: Tyna Jaksch RVT  Examination Guidelines: A complete evaluation includes B-mode imaging, spectral Doppler, color Doppler, and power Doppler as needed of all accessible portions of each vessel. Bilateral testing is considered an integral part of a complete examination. Limited examinations for reoccurring indications may be performed as noted.  Right Carotid Findings: +----------+--------+--------+--------+----------------------+--------+           PSV cm/sEDV cm/sStenosisPlaque Description    Comments +----------+--------+--------+--------+----------------------+--------+ CCA Prox  61      10                                             +----------+--------+--------+--------+----------------------+--------+ CCA Distal51      14                                             +----------+--------+--------+--------+----------------------+--------+ ICA Prox  39      11      Normal  heterogenous and focal          +----------+--------+--------+--------+----------------------+--------+ ICA Distal33      13                                             +----------+--------+--------+--------+----------------------+--------+ ECA       69      9                                              +----------+--------+--------+--------+----------------------+--------+ +----------+--------+-------+----------------+-------------------+           PSV cm/sEDV cmsDescribe        Arm Pressure (mmHG) +----------+--------+-------+----------------+-------------------+ [id number]             Multiphasic, [id number]                 +----------+--------+-------+----------------+-------------------+ +---------+--------+--+--------+--+---------+ VertebralPSV cm/s36EDV cm/s14Antegrade +---------+--------+--+--------+--+---------+  Left Carotid Findings: +----------+--------+--------+--------+----------------------+--------+           PSV cm/sEDV cm/sStenosisPlaque Description    Comments +----------+--------+--------+--------+----------------------+--------+ CCA Prox  60      16                                             +----------+--------+--------+--------+----------------------+--------+ CCA  Distal45      15                                             +----------+--------+--------+--------+----------------------+--------+ ICA Prox  53      12      Normal  heterogenous and focal         +----------+--------+--------+--------+----------------------+--------+ ICA Distal58      26                                             +----------+--------+--------+--------+----------------------+--------+ ECA       71      13                                             +----------+--------+--------+--------+----------------------+--------+ +----------+--------+--------+----------------+-------------------+           PSV cm/sEDV cm/sDescribe        Arm Pressure (mmHG)  +----------+--------+--------+----------------+-------------------+ [id number]              Multiphasic, [id number]                 +----------+--------+--------+----------------+-------------------+ +---------+--------+--+--------+--+---------+ VertebralPSV cm/s36EDV cm/s13Antegrade +---------+--------+--+--------+--+---------+   Summary: Right Carotid: There is no evidence of stenosis in the right ICA. The                extracranial vessels were near-normal with only minimal wall                thickening or plaque. Left Carotid: There is no evidence of stenosis in the left ICA. The extracranial               vessels were near-normal with only minimal wall thickening or               plaque. Vertebrals:  Bilateral vertebral arteries demonstrate antegrade flow. Subclavians: Normal flow hemodynamics were seen in bilateral subclavian              arteries. *See table(s) above for measurements and observations.  Electronically signed by Julien Nordmann MD on 12/17/2022 at 10:04:55 PM.    Final     Assessment & Plan:    Acquired hypothyroidism- He is euthyroid.  Atrial fibrillation with RVR (HCC)- He has good R/R control.     Follow-up: Return in about 3 months (around 07/27/2023).  Sanda Linger, MD

## 2023-04-27 ENCOUNTER — Encounter: Payer: Self-pay | Admitting: Family Medicine

## 2023-05-05 NOTE — Discharge Instructions (Addendum)
Post Procedure Spinal Discharge Instruction Sheet ? ?You may resume a regular diet and any medications that you routinely take (including pain medications) unless otherwise noted by MD. ? ?No driving day of procedure. ? ?Light activity throughout the rest of the day.  Do not do any strenuous work, exercise, bending or lifting.  The day following the procedure, you can resume normal physical activity but you should refrain from exercising or physical therapy for at least three days thereafter. ? ?You may apply ice to the injection site, 20 minutes on, 20 minutes off, as needed. Do not apply ice directly to skin.  ? ? ?Common Side Effects: ? ?Headaches- take your usual medications as directed by your physician.  Increase your fluid intake.  Caffeinated beverages may be helpful.  Lie flat in bed until your headache resolves. ? ?Restlessness or inability to sleep- you may have trouble sleeping for the next few days.  Ask your referring physician if you need any medication for sleep. ? ?Facial flushing or redness- should subside within a few days. ? ?Increased pain- a temporary increase in pain a day or two following your procedure is not unusual.  Take your pain medication as prescribed by your referring physician. ? ?Leg cramps ? ?Please contact our office at [phone] for the following symptoms: ?Fever greater than 100 degrees. ?Headaches unresolved with medication after 2-3 days. ?Increased swelling, pain, or redness at injection site. ? ? ?Thank you for visiting [facility] today.   ? ?YOU MAY RESUME YOUR XARELTO IN 24 HOURS.  ?

## 2023-05-08 ENCOUNTER — Ambulatory Visit
Admission: RE | Admit: 2023-05-08 | Discharge: 2023-05-08 | Disposition: A | Discharge status: Home / Self Care | Source: Ambulatory Visit | Attending: Family Medicine | Admitting: Family Medicine

## 2023-05-08 DIAGNOSIS — M47817 Spondylosis without myelopathy or radiculopathy, lumbosacral region: Secondary | ICD-10-CM | POA: Diagnosis not present

## 2023-05-08 DIAGNOSIS — M5416 Radiculopathy, lumbar region: Secondary | ICD-10-CM

## 2023-05-08 MED ORDER — METHYLPREDNISOLONE ACETATE 40 MG/ML INJ SUSP (RADIOLOG
80.0000 mg | Freq: Once | INTRAMUSCULAR | Status: AC
Start: 2023-05-08 — End: 2023-05-08
  Administered 2023-05-08: 80 mg via EPIDURAL

## 2023-05-08 MED ORDER — IOPAMIDOL (ISOVUE-M 200) INJECTION 41%
1.0000 mL | Freq: Once | INTRAMUSCULAR | Status: AC
  Administered 2023-05-08: 1 mL via EPIDURAL

## 2023-05-08 NOTE — Addendum Note (Signed)
 Addended by: Elease Etienne A on: 05/08/2023 12:17 PM   Modules accepted: Orders

## 2023-05-08 NOTE — Progress Notes (Signed)
 Carelink Summary Report / Loop Recorder

## 2023-05-12 ENCOUNTER — Ambulatory Visit (INDEPENDENT_AMBULATORY_CARE_PROVIDER_SITE_OTHER): Payer: Medicare PPO

## 2023-05-12 DIAGNOSIS — G459 Transient cerebral ischemic attack, unspecified: Secondary | ICD-10-CM | POA: Diagnosis not present

## 2023-05-16 LAB — CUP PACEART REMOTE DEVICE CHECK
Date Time Interrogation Session: 20250404154514
Implantable Pulse Generator Implant Date: 20241219

## 2023-06-16 ENCOUNTER — Ambulatory Visit (INDEPENDENT_AMBULATORY_CARE_PROVIDER_SITE_OTHER): Payer: Medicare PPO

## 2023-06-16 DIAGNOSIS — G459 Transient cerebral ischemic attack, unspecified: Secondary | ICD-10-CM

## 2023-06-19 LAB — CUP PACEART REMOTE DEVICE CHECK
Date Time Interrogation Session: 20250509154816
Implantable Pulse Generator Implant Date: 20241219

## 2023-06-19 NOTE — Addendum Note (Signed)
 Addended by: Lott Rouleau A on: 06/19/2023 11:48 AM   Modules accepted: Orders

## 2023-06-19 NOTE — Progress Notes (Signed)
 Carelink Summary Report / Loop Recorder

## 2023-06-20 ENCOUNTER — Ambulatory Visit: Payer: Self-pay | Admitting: Cardiovascular Disease

## 2023-06-26 ENCOUNTER — Other Ambulatory Visit: Payer: Self-pay | Admitting: Internal Medicine

## 2023-06-26 DIAGNOSIS — E785 Hyperlipidemia, unspecified: Secondary | ICD-10-CM

## 2023-06-27 ENCOUNTER — Telehealth: Payer: Self-pay | Admitting: Internal Medicine

## 2023-06-27 ENCOUNTER — Other Ambulatory Visit: Payer: Self-pay

## 2023-06-27 DIAGNOSIS — E785 Hyperlipidemia, unspecified: Secondary | ICD-10-CM

## 2023-06-27 MED ORDER — PITAVASTATIN CALCIUM 1 MG PO TABS: 1.0000 mg | ORAL_TABLET | Freq: Every day | ORAL | 1 refills | Status: DC

## 2023-06-27 NOTE — Telephone Encounter (Signed)
 Copied from CRM [id number]. Topic: Clinical - Medication Question >> Jun 27, 2023 10:48 AM Clydene Darner H wrote: Reason for CRM: Patient is requesting the of Pitavastatin  instead of Livalo  due to cost concerns.Upon review, the medication was reordered by CMA on 06/26/23, but the last e-prescribing status is showing 03/06/23.Patient has only few pills left and will be leaving for vacation before the end of this week.

## 2023-06-27 NOTE — Telephone Encounter (Signed)
Medication has been refilled and sent to his local pharmacy.  

## 2023-06-30 ENCOUNTER — Ambulatory Visit: Payer: Medicare PPO | Admitting: Cardiology

## 2023-07-05 DIAGNOSIS — I480 Paroxysmal atrial fibrillation: Secondary | ICD-10-CM | POA: Insufficient documentation

## 2023-07-05 NOTE — Progress Notes (Unsigned)
  Cardiology Office Note:   Date:  07/07/2023  ID:  AHNAF CAPONI, DOB 1940-07-22, MRN [id number] PCP: Arcadio Knuckles, MD  [facility] Providers Cardiologist:  Eilleen Grates, MD {  History of Present Illness:   Keith [name] is a 83 y.o. male who presents for   evaluation of atrial fibrillation.   This was an isolated episode so we did not start anticoagulation.  He wanted to try to stay off medications.  I ordered an echo which was unremarkable.  Zio demonstrated paroxysmal atrial tachycardia and SVT but no atrial fibrillation.  He had a ILR which has demonstrated atrial fib.  He is on Xarelto .     He returns for follow-up.  Since I saw him he has done well.  He occasionally gets some atrial fibrillation on his device but he does not really notice this.  He has not had any presyncope or syncope.  He does not have any chest pressure, neck or arm discomfort.  He does still get some occasional double vision.  He does not describe dizziness.  He does not describe blurred vision.  It is more of a doubling of his vision.  He lives by himself since his wife died of Alzheimer's although he does have some foster grandchildren in his house every couple of weeks to help I think he has some.  ROS: Positive for neuropathy.  Otherwise as stated in the HPI negative for all other systems.  Studies Reviewed:    EKG:   NA  Risk Assessment/Calculations:    CHA2DS2-VASc Score = 2   This indicates a 2.2% annual risk of stroke. The patient's score is based upon: CHF History: 0 HTN History: 0 Diabetes History: 0 Stroke History: 0 Vascular Disease History: 0 Age Score: 2 Gender Score: 0    Physical Exam:   VS:  BP (!) 144/85 (BP Location: Left Arm, Patient Position: Sitting, Cuff Size: Normal)   Pulse (!) 54   Ht 5\' 10"  (1.778 m)   Wt 203 lb 3.2 oz (92.2 kg)   SpO2 98%   BMI 29.16 kg/m    Wt Readings from Last 3 Encounters:  07/07/23 203 lb 3.2 oz (92.2 kg)  04/26/23 204 lb 9.6 oz (92.8  kg)  02/03/23 202 lb (91.6 kg)     GEN: Well nourished, well developed in no acute distress NECK: No JVD; No carotid bruits CARDIAC: RRR, no murmurs, rubs, gallops RESPIRATORY:  Clear to auscultation without rales, wheezing or rhonchi  ABDOMEN: Soft, non-tender, non-distended EXTREMITIES:  No edema; No deformity   ASSESSMENT AND PLAN:   Atrial fib: CHA2DS2-VASc score would be 2.  He continues anticoagulation.  Has had no new symptoms.  No change in therapy.  He is talk to his primary care doctor about his occasional double vision.    Follow up with me in one year.   Signed, Eilleen Grates, MD

## 2023-07-07 ENCOUNTER — Encounter: Payer: Self-pay | Admitting: Cardiology

## 2023-07-07 ENCOUNTER — Ambulatory Visit: Attending: Cardiology | Admitting: Cardiology

## 2023-07-07 VITALS — BP 144/85 | HR 54 | Ht 70.0 in | Wt 203.2 lb

## 2023-07-07 DIAGNOSIS — I480 Paroxysmal atrial fibrillation: Secondary | ICD-10-CM

## 2023-07-07 NOTE — Patient Instructions (Signed)
 Medication Instructions:  Your physician recommends that you continue on your current medications as directed. Please refer to the Current Medication list given to you today.  *If you need a refill on your cardiac medications before your next appointment, please call your pharmacy*  Lab Work: none If you have labs (blood work) drawn today and your tests are completely normal, you will receive your results only by: MyChart Message (if you have MyChart) OR A paper copy in the mail If you have any lab test that is abnormal or we need to change your treatment, we will call you to review the results.  Testing/Procedures: none  Follow-Up: At [facility], you and your health needs are our priority.  As part of our continuing mission to provide you with exceptional heart care, our providers are all part of one team.  This team includes your primary Cardiologist (physician) and Advanced Practice Providers or APPs (Physician Assistants and Nurse Practitioners) who all work together to provide you with the care you need, when you need it.  Your next appointment:   1 year(s)  Provider:   Eilleen Grates, MD    We recommend signing up for the patient portal called "MyChart".  Sign up information is provided on this After Visit Summary.  MyChart is used to connect with patients for Virtual Visits (Telemedicine).  Patients are able to view lab/test results, encounter notes, upcoming appointments, etc.  Non-urgent messages can be sent to your provider as well.   To learn more about what you can do with MyChart, go to [email or web address].

## 2023-07-10 ENCOUNTER — Other Ambulatory Visit: Payer: Self-pay | Admitting: Internal Medicine

## 2023-07-12 ENCOUNTER — Encounter: Payer: Self-pay | Admitting: Internal Medicine

## 2023-07-12 ENCOUNTER — Ambulatory Visit: Admitting: Internal Medicine

## 2023-07-12 VITALS — BP 124/76 | HR 63 | Temp 98.5°F | Resp 16 | Ht 70.0 in | Wt 201.2 lb

## 2023-07-12 DIAGNOSIS — R7303 Prediabetes: Secondary | ICD-10-CM

## 2023-07-12 DIAGNOSIS — E785 Hyperlipidemia, unspecified: Secondary | ICD-10-CM | POA: Diagnosis not present

## 2023-07-12 DIAGNOSIS — E538 Deficiency of other specified B group vitamins: Secondary | ICD-10-CM

## 2023-07-12 DIAGNOSIS — I480 Paroxysmal atrial fibrillation: Secondary | ICD-10-CM | POA: Diagnosis not present

## 2023-07-12 DIAGNOSIS — E039 Hypothyroidism, unspecified: Secondary | ICD-10-CM | POA: Diagnosis not present

## 2023-07-12 LAB — LIPID PANEL
Cholesterol: 144 mg/dL (ref 0–200)
HDL: 35 mg/dL — ABNORMAL LOW (ref >39.00)
LDL Cholesterol: 83 mg/dL (ref 0–99)
NonHDL: 109.27
Total CHOL/HDL Ratio: 4
Triglycerides: 130 mg/dL (ref 0.0–149.0)
VLDL: 26 mg/dL (ref 0.0–40.0)

## 2023-07-12 LAB — HEPATIC FUNCTION PANEL
ALT: 16 U/L (ref 0–53)
AST: 19 U/L (ref 0–37)
Albumin: 3.9 g/dL (ref 3.5–5.2)
Alkaline Phosphatase: 47 U/L (ref 39–117)
Bilirubin, Direct: 0.1 mg/dL (ref 0.0–0.3)
Total Bilirubin: 0.7 mg/dL (ref 0.2–1.2)
Total Protein: 6.9 g/dL (ref 6.0–8.3)

## 2023-07-12 LAB — CBC WITH DIFFERENTIAL/PLATELET
Basophils Absolute: 0.1 10*3/uL (ref 0.0–0.1)
Basophils Relative: 0.8 % (ref 0.0–3.0)
Eosinophils Absolute: 0.1 10*3/uL (ref 0.0–0.7)
Eosinophils Relative: 1.4 % (ref 0.0–5.0)
HCT: 47.5 % (ref 39.0–52.0)
Hemoglobin: 15.8 g/dL (ref 13.0–17.0)
Lymphocytes Relative: 28.1 % (ref 12.0–46.0)
Lymphs Abs: 2.3 10*3/uL (ref 0.7–4.0)
MCHC: 33.3 g/dL (ref 30.0–36.0)
MCV: 92.1 fl (ref 78.0–100.0)
Monocytes Absolute: 0.7 10*3/uL (ref 0.1–1.0)
Monocytes Relative: 9.1 % (ref 3.0–12.0)
Neutro Abs: 4.8 10*3/uL (ref 1.4–7.7)
Neutrophils Relative %: 60.6 % (ref 43.0–77.0)
Platelets: 165 10*3/uL (ref 150.0–400.0)
RBC: 5.16 Mil/uL (ref 4.22–5.81)
RDW: 14.2 % (ref 11.5–15.5)
WBC: 8 10*3/uL (ref 4.0–10.5)

## 2023-07-12 LAB — VITAMIN B12: Vitamin B-12: 1126 pg/mL — ABNORMAL HIGH (ref 211–911)

## 2023-07-12 LAB — BASIC METABOLIC PANEL WITH GFR
BUN: 16 mg/dL (ref 6–23)
CO2: 27 meq/L (ref 19–32)
Calcium: 8.8 mg/dL (ref 8.4–10.5)
Chloride: 105 meq/L (ref 96–112)
Creatinine, Ser: 1.02 mg/dL (ref 0.40–1.50)
GFR: 68.78 mL/min (ref >60.00)
Glucose, Bld: 108 mg/dL — ABNORMAL HIGH (ref 70–99)
Potassium: 4 meq/L (ref 3.5–5.1)
Sodium: 139 meq/L (ref 135–145)

## 2023-07-12 LAB — HEMOGLOBIN A1C: Hgb A1c MFr Bld: 6.2 % (ref 4.6–6.5)

## 2023-07-12 LAB — TSH: TSH: 3.43 u[IU]/mL (ref 0.35–5.50)

## 2023-07-12 LAB — FOLATE: Folate: >23.2 ng/mL (ref >5.9)

## 2023-07-12 MED ORDER — DILTIAZEM HCL ER COATED BEADS 120 MG PO CP24: 120.0000 mg | ORAL_CAPSULE | Freq: Every day | ORAL | 1 refills | Status: DC

## 2023-07-12 MED ORDER — LEVOTHYROXINE SODIUM 112 MCG PO TABS: 112.0000 ug | ORAL_TABLET | Freq: Every day | ORAL | 1 refills | Status: DC

## 2023-07-12 NOTE — Patient Instructions (Signed)

## 2023-07-12 NOTE — Progress Notes (Signed)
 Subjective:  Patient ID: Keith [name], male    DOB: [birth year]  Age: 83 y.o. MRN: [id number]  CC: Hair/Scalp Problem (Scalp pigmentation. Patient notes they sit out on their patio daily and believes this to be what has caused this. Wants to know if there is anything that can be done at this point)   HPI Keith [name] presents for f/up ---  Discussed the use of AI scribe software for clinical note transcription with the patient, who gave verbal consent to proceed.  History of Present Illness   Keith [name] is an 83 year old male who presents with pale spots on his scalp.  He noticed pale spots on his scalp approximately two weeks ago, which he describes as 'sun damaged skin'. He is concerned about his appearance, especially since a peer in his sun suit class, who is 23 years old, has a similar issue. He enjoys spending time in the sun, often sitting out on his patio.  He experiences frequent urination, especially when active, but can go three to four hours without urinating when sitting, such as during church. He takes medication for this issue, which he attributes to aging.  He has neuropathy, primarily in one leg, which affects his balance at times. No swelling in his legs.  No chest pain, shortness of breath, dizziness, lightheadedness, or swelling in the legs or feet.       Outpatient Medications Prior to Visit  Medication Sig Dispense Refill   azelastine  (ASTELIN ) 0.1 % nasal spray Place 2 sprays into both nostrils 2 (two) times daily. Use in each nostril as directed 90 mL 1   Boswellia-Glucosamine-Vit D (OSTEO BI-FLEX ONE PER DAY PO) Take by mouth.     ferrous sulfate  324 MG TBEC Take 324 mg by mouth daily.     fluticasone  (FLONASE ) 50 MCG/ACT nasal spray SHAKE LIQUID AND USE 2 SPRAYS IN EACH NOSTRIL DAILY (Patient taking differently: Place 1 spray into both nostrils as needed for allergies.) 48 g 1   levothyroxine  (SYNTHROID ) 112 MCG tablet Take 1 tablet (112 mcg total)  by mouth daily before breakfast. Annual appt due in Sept must see provider for future refills 90 tablet 1   magnesium  oxide (MAG-OX) 400 MG tablet Take 400 mg by mouth daily.     Misc Natural Products ([employer] PROSTATE MAX + PO) Take 1 tablet by mouth 2 (two) times daily.     Pitavastatin  Calcium  1 MG TABS Take 1 tablet (1 mg total) by mouth daily. 90 tablet 1   rivaroxaban  (XARELTO ) 20 MG TABS tablet Take 1 tablet (20 mg total) by mouth daily with supper. 30 tablet 11   diltiazem  (CARDIZEM  CD) 120 MG 24 hr capsule Take 1 capsule (120 mg total) by mouth daily. 90 capsule 0   No facility-administered medications prior to visit.    ROS Review of Systems  Constitutional:  Negative for appetite change, chills, diaphoresis, fatigue and fever.  HENT: Negative.    Eyes: Negative.   Respiratory: Negative.  Negative for cough, chest tightness, shortness of breath and wheezing.   Cardiovascular:  Negative for chest pain, palpitations and leg swelling.  Gastrointestinal: Negative.  Negative for abdominal pain, constipation, diarrhea, nausea and vomiting.  Genitourinary:  Positive for frequency. Negative for difficulty urinating, dysuria, hematuria and urgency.  Musculoskeletal: Negative.  Negative for arthralgias and myalgias.  Skin:  Positive for color change. Negative for rash.  Neurological:  Negative for dizziness, weakness and light-headedness.  Hematological:  Negative for  adenopathy. Does not bruise/bleed easily.  Psychiatric/Behavioral: Negative.      Objective:  BP 124/76 (BP Location: Right Arm, Cuff Size: Normal)   Pulse 63   Temp 98.5 F (36.9 C) (Oral)   Resp 16   Ht 5\' 10"  (1.778 m)   Wt 201 lb 3.2 oz (91.3 kg)   SpO2 97%   BMI 28.87 kg/m   BP Readings from Last 3 Encounters:  07/12/23 124/76  07/07/23 (!) 144/85  05/08/23 137/81    Wt Readings from Last 3 Encounters:  07/12/23 201 lb 3.2 oz (91.3 kg)  07/07/23 203 lb 3.2 oz (92.2 kg)  04/26/23 204 lb 9.6 oz (92.8 kg)     Physical Exam Vitals reviewed.  Constitutional:      Appearance: Normal appearance.  HENT:     Nose: Nose normal.     Mouth/Throat:     Mouth: Mucous membranes are moist.  Eyes:     General: No scleral icterus.    Conjunctiva/sclera: Conjunctivae normal.  Cardiovascular:     Rate and Rhythm: Normal rate and regular rhythm.     Heart sounds: No murmur heard.    No friction rub. No gallop.  Pulmonary:     Effort: Pulmonary effort is normal.     Breath sounds: No stridor. No wheezing, rhonchi or rales.  Abdominal:     General: Abdomen is flat.     Palpations: There is no mass.     Tenderness: There is no abdominal tenderness. There is no guarding.     Hernia: No hernia is present.  Musculoskeletal:        General: Normal range of motion.     Cervical back: Neck supple.     Right lower leg: No edema.     Left lower leg: No edema.  Lymphadenopathy:     Cervical: No cervical adenopathy.  Skin:    General: Skin is warm and dry.  Neurological:     General: No focal deficit present.     Mental Status: He is alert.  Psychiatric:        Mood and Affect: Mood normal.        Behavior: Behavior normal.     Lab Results  Component Value Date   WBC 8.0 07/12/2023   HGB 15.8 07/12/2023   HCT 47.5 07/12/2023   PLT 165.0 07/12/2023   GLUCOSE 108 (H) 07/12/2023   CHOL 144 07/12/2023   TRIG 130.0 07/12/2023   HDL 35.00 (L) 07/12/2023   LDLCALC 83 07/12/2023   ALT 16 07/12/2023   AST 19 07/12/2023   NA 139 07/12/2023   K 4.0 07/12/2023   CL 105 07/12/2023   CREATININE 1.02 07/12/2023   BUN 16 07/12/2023   CO2 27 07/12/2023   TSH 3.43 07/12/2023   PSA 3.28 03/16/2021   INR 1.01 11/27/2017   HGBA1C 6.2 07/12/2023    DG INJECT DIAG/THERA/INC NEEDLE/CATH/PLC EPI/LUMB/SAC W/IMG Result Date: 05/08/2023 CLINICAL DATA:  Lumbosacral spondylosis without myelopathy. 100% relief following the L5-S1 interlaminar epidural injection on 11/22/2022. Recurrent left low back pain.  FLUOROSCOPY: Radiation Exposure Index (as provided by the fluoroscopic device): 3.40 mGy Kerma PROCEDURE: The procedure, risks, benefits, and alternatives were explained to the patient. Questions regarding the procedure were encouraged and answered. The patient understands and consents to the procedure. LUMBAR EPIDURAL INJECTION: An interlaminar approach was performed on the left at L5-S1. The overlying skin was cleansed and anesthetized. A 3.5 inch 20 gauge epidural needle was advanced using loss-of-resistance technique.  DIAGNOSTIC EPIDURAL INJECTION Injection of Isovue -M 200 shows a good epidural pattern with spread above and below the level of needle placement primarily on the left. No vascular opacification is seen. THERAPEUTIC EPIDURAL INJECTION: 80 mg of Depo-Medrol  mixed with 3 mL of 1% lidocaine  were instilled. The procedure was well-tolerated, and the patient was discharged thirty minutes following the injection in good condition. COMPLICATIONS: None immediate IMPRESSION: Technically successful interlaminar epidural injection on the left at L5-S1. Electronically Signed   By: Aundra Lee M.D.   On: 05/08/2023 12:49    Assessment & Plan:  PAF (paroxysmal atrial fibrillation) (HCC)- He has good R/R control. -     dilTIAZem  HCl ER Coated Beads; Take 1 capsule (120 mg total) by mouth daily.  Dispense: 90 capsule; Refill: 1 -     Basic metabolic panel with GFR; Future -     TSH; Future  Acquired hypothyroidism- He is euthyroid. -     TSH; Future  Hyperlipidemia with target LDL less than 130 -     Lipid panel; Future -     Hepatic function panel; Future  B12 nutritional deficiency -     CBC with Differential/Platelet; Future -     Folate; Future -     Vitamin B12; Future  Prediabetes -     Basic metabolic panel with GFR; Future -     Hemoglobin A1c; Future     Follow-up: Return in about 6 months (around 01/11/2024).  Sandra Crouch, MD

## 2023-07-18 LAB — CUP PACEART REMOTE DEVICE CHECK
Date Time Interrogation Session: 20250609160320
Implantable Pulse Generator Implant Date: 20241219

## 2023-07-19 ENCOUNTER — Ambulatory Visit: Payer: Self-pay | Admitting: Internal Medicine

## 2023-07-21 ENCOUNTER — Ambulatory Visit: Payer: Medicare PPO

## 2023-07-21 DIAGNOSIS — G459 Transient cerebral ischemic attack, unspecified: Secondary | ICD-10-CM

## 2023-07-21 NOTE — Progress Notes (Signed)
 Carelink Summary Report / Loop Recorder

## 2023-07-21 NOTE — Addendum Note (Signed)
 Addended by: Lott Rouleau A on: 07/21/2023 11:03 AM   Modules accepted: Orders

## 2023-07-31 ENCOUNTER — Ambulatory Visit: Payer: Self-pay | Admitting: Cardiovascular Disease

## 2023-08-21 ENCOUNTER — Ambulatory Visit (INDEPENDENT_AMBULATORY_CARE_PROVIDER_SITE_OTHER)

## 2023-08-21 DIAGNOSIS — G459 Transient cerebral ischemic attack, unspecified: Secondary | ICD-10-CM | POA: Diagnosis not present

## 2023-08-21 LAB — CUP PACEART REMOTE DEVICE CHECK
Date Time Interrogation Session: 20250714004221
Implantable Pulse Generator Implant Date: 20241219

## 2023-08-22 ENCOUNTER — Ambulatory Visit: Payer: Self-pay | Admitting: Cardiovascular Disease

## 2023-09-07 NOTE — Progress Notes (Signed)
 Carelink Summary Report / Loop Recorder

## 2023-09-21 ENCOUNTER — Ambulatory Visit (INDEPENDENT_AMBULATORY_CARE_PROVIDER_SITE_OTHER)

## 2023-09-21 DIAGNOSIS — G459 Transient cerebral ischemic attack, unspecified: Secondary | ICD-10-CM

## 2023-09-21 LAB — CUP PACEART REMOTE DEVICE CHECK
Date Time Interrogation Session: 20250813234228
Implantable Pulse Generator Implant Date: 20241219

## 2023-09-25 ENCOUNTER — Other Ambulatory Visit: Payer: Self-pay | Admitting: Internal Medicine

## 2023-09-25 DIAGNOSIS — E785 Hyperlipidemia, unspecified: Secondary | ICD-10-CM

## 2023-10-01 ENCOUNTER — Ambulatory Visit: Payer: Self-pay | Admitting: Cardiovascular Disease

## 2023-10-23 ENCOUNTER — Ambulatory Visit (INDEPENDENT_AMBULATORY_CARE_PROVIDER_SITE_OTHER)

## 2023-10-23 DIAGNOSIS — G459 Transient cerebral ischemic attack, unspecified: Secondary | ICD-10-CM

## 2023-10-23 LAB — CUP PACEART REMOTE DEVICE CHECK
Date Time Interrogation Session: 20250913233156
Implantable Pulse Generator Implant Date: 20241219

## 2023-10-26 ENCOUNTER — Ambulatory Visit: Payer: Self-pay | Admitting: Cardiovascular Disease

## 2023-10-28 NOTE — Progress Notes (Signed)
 Remote Loop Recorder Transmission

## 2023-11-01 ENCOUNTER — Ambulatory Visit: Payer: Self-pay

## 2023-11-01 ENCOUNTER — Encounter: Payer: Self-pay | Admitting: Emergency Medicine

## 2023-11-01 ENCOUNTER — Ambulatory Visit: Admitting: Emergency Medicine

## 2023-11-01 VITALS — BP 118/72 | HR 53 | Temp 97.6°F | Ht 70.0 in | Wt 202.0 lb

## 2023-11-01 DIAGNOSIS — K5900 Constipation, unspecified: Secondary | ICD-10-CM | POA: Diagnosis not present

## 2023-11-01 LAB — COMPREHENSIVE METABOLIC PANEL WITH GFR
ALT: 20 U/L (ref 0–53)
AST: 22 U/L (ref 0–37)
Albumin: 4 g/dL (ref 3.5–5.2)
Alkaline Phosphatase: 50 U/L (ref 39–117)
BUN: 18 mg/dL (ref 6–23)
CO2: 27 meq/L (ref 19–32)
Calcium: 8.8 mg/dL (ref 8.4–10.5)
Chloride: 104 meq/L (ref 96–112)
Creatinine, Ser: 1.04 mg/dL (ref 0.40–1.50)
GFR: 67.05 mL/min (ref >60.00)
Glucose, Bld: 97 mg/dL (ref 70–99)
Potassium: 4.2 meq/L (ref 3.5–5.1)
Sodium: 140 meq/L (ref 135–145)
Total Bilirubin: 0.6 mg/dL (ref 0.2–1.2)
Total Protein: 7 g/dL (ref 6.0–8.3)

## 2023-11-01 LAB — CBC WITH DIFFERENTIAL/PLATELET
Basophils Absolute: 0.1 K/uL (ref 0.0–0.1)
Basophils Relative: 1.1 % (ref 0.0–3.0)
Eosinophils Absolute: 0.1 K/uL (ref 0.0–0.7)
Eosinophils Relative: 1.7 % (ref 0.0–5.0)
HCT: 47.6 % (ref 39.0–52.0)
Hemoglobin: 15.7 g/dL (ref 13.0–17.0)
Lymphocytes Relative: 26.8 % (ref 12.0–46.0)
Lymphs Abs: 2.2 K/uL (ref 0.7–4.0)
MCHC: 33.1 g/dL (ref 30.0–36.0)
MCV: 93.3 fl (ref 78.0–100.0)
Monocytes Absolute: 0.7 K/uL (ref 0.1–1.0)
Monocytes Relative: 9.2 % (ref 3.0–12.0)
Neutro Abs: 5 K/uL (ref 1.4–7.7)
Neutrophils Relative %: 61.2 % (ref 43.0–77.0)
Platelets: 180 K/uL (ref 150.0–400.0)
RBC: 5.1 Mil/uL (ref 4.22–5.81)
RDW: 13.6 % (ref 11.5–15.5)
WBC: 8.2 K/uL (ref 4.0–10.5)

## 2023-11-01 LAB — TSH: TSH: 3.98 u[IU]/mL (ref 0.35–5.50)

## 2023-11-01 MED ORDER — LINACLOTIDE 145 MCG PO CAPS: 145.0000 ug | ORAL_CAPSULE | Freq: Every day | ORAL | 1 refills | Status: AC

## 2023-11-01 NOTE — Patient Instructions (Addendum)
 Recommend [place]' milk of magnesia today.  Follow instructions on the bottle Start Linzess  145 mg daily for 7 days Use saline enema once today. Stay well-hydrated and increase amount of fiber in your diet.  Constipation, Adult Constipation is when a person has trouble pooping (having a bowel movement). When you have this condition, you may poop fewer than 3 times a week. Your poop (stool) may also be dry, hard, or bigger than normal. Follow these instructions at home: Eating and drinking  Eat foods that have a lot of fiber, such as: Fresh fruits and vegetables. Whole grains. Beans. Eat less of foods that are low in fiber and high in fat and sugar, such as: [place] fries. Hamburgers. Cookies. Candy. Soda. Drink enough fluid to keep your pee (urine) pale yellow. General instructions Exercise regularly or as told by your doctor. Try to do 150 minutes of exercise each week. Go to the restroom when you feel like you need to poop. Do not hold it in. Take over-the-counter and prescription medicines only as told by your doctor. These include any fiber supplements. When you poop: Do deep breathing while relaxing your lower belly (abdomen). Relax your pelvic floor. The pelvic floor is a group of muscles that support the rectum, bladder, and intestines (as well as the uterus in women). Watch your condition for any changes. Tell your doctor if you notice any. Keep all follow-up visits as told by your doctor. This is important. Contact a doctor if: You have pain that gets worse. You have a fever. You have not pooped for 4 days. You vomit. You are not hungry. You lose weight. You are bleeding from the opening of the butt (anus). You have thin, pencil-like poop. Get help right away if: You have a fever, and your symptoms suddenly get worse. You leak poop or have blood in your poop. Your belly feels hard or bigger than normal (bloated). You have very bad belly pain. You feel dizzy or you  faint. Summary Constipation is when a person poops fewer than 3 times a week, has trouble pooping, or has poop that is dry, hard, or bigger than normal. Eat foods that have a lot of fiber. Drink enough fluid to keep your pee (urine) pale yellow. Take over-the-counter and prescription medicines only as told by your doctor. These include any fiber supplements. This information is not intended to replace advice given to you by your health care provider. Make sure you discuss any questions you have with your health care provider. Document Revised: 12/08/2021 Document Reviewed: 12/08/2021 Elsevier Patient Education  2024 [employer].

## 2023-11-01 NOTE — Telephone Encounter (Signed)
 FYI Only or Action Required?: FYI only for provider.  Patient was last seen in primary care on 07/12/2023 by Joshua Debby CROME, MD.  Called Nurse Triage reporting Constipation.  Symptoms began several weeks ago.  Interventions attempted: OTC medications: Ducolax, Miralax, Enema.  Symptoms are: unchanged.  Triage Disposition: See PCP When Office is Open (Within 3 Days)  Patient/caregiver understands and will follow disposition?: Yes   Message from Lyons S sent at 11/01/2023 10:25 AM EDT  Summary: Constipation   Constipation x 1 month      Reason for Disposition  Unable to have a bowel movement (BM) without laxative or enema  Answer Assessment - Initial Assessment Questions Additional info: For several weeks he has been dealing with infrequent bm's and constipation, requesting appointment. Took Ducolax couple weeks ago which did help after one week of no bm.  He became constipated again,  took another dose ducolax this past Monday but not helpful. He is needing to strain to pass very small hard stool. Today he has taken Miralax and Enema with sticky stool return. Denies blood. Denies pain. Denies Fever.    1. STOOL PATTERN OR FREQUENCY: How often do you have a bowel movement (BM)?  (Normal range: 3 times a day to every 3 days)  When was your last BM?       Needed enema today 2. STRAINING: Do you have to strain to have a BM?      yes 3. ONSET: When did the constipation begin?     2-3 weeks  4. RECTAL PAIN: Does your rectum hurt when the stool comes out? If Yes, ask: Do you have hemorrhoids? How bad is the pain?  (Scale 1-10; or mild, moderate, severe)     Denies  5. BM COMPOSITION: Are the stools hard?      hard 6. BLOOD ON STOOLS: Has there been any blood on the toilet tissue or on the surface of the BM? If Yes, ask: When was the last time?     denies 7. CHRONIC CONSTIPATION: Is this a new problem for you?  If No, ask: How long have you had this problem?  (days, weeks, months)      new 8. CHANGES IN DIET OR HYDRATION: Have there been any recent changes in your diet? How much fluids are you drinking on a daily basis?  How much have you had to drink today?     No 9. MEDICINES: Have you been taking any new medicines? Are you taking any narcotic pain medicines? (e.g., Dilaudid , morphine , Percocet, Vicodin)     no 10. LAXATIVES: Have you been using any stool softeners, laxatives, or enemas?  If Yes, ask What are you using, how often, and when was the last time?       Enema, ducolax 11. ACTIVITY:  How much walking do you do every day?  Has your activity level decreased in the past week?        Still active 12. CAUSE: What do you think is causing the constipation?        Unsure-age 83. MEDICAL HISTORY: Do you have a history of hemorrhoids, rectal fissures, rectal surgery, or rectal abscess?          14. OTHER SYMPTOMS: Do you have any other symptoms? (e.g., abdomen pain, bloating, fever, vomiting)       Denies other symptoms  Protocols used: Constipation-A-AH

## 2023-11-01 NOTE — Progress Notes (Signed)
 Keith [name] 83 y.o.   Chief Complaint  Patient presents with   Constipation    Patient here for constipation. States its been going on for about a month. He states trying Duclax & miralax to help but hasn't, also did a suppository this morning and did nothing . Last BM was 3 days ago and it was very little. He is bloated, mentions that when he did the suppository his stool was very sticky and dark brown.     HISTORY OF PRESENT ILLNESS: This is a 83 y.o. male complaining of constipation that started 2 weeks ago. Last week he took Dulcolax which helped Symptoms returned this week.  Took Dulcolax again and MiraLAX this morning without success Used suppository this morning without success. Denies abdominal pain.  Denies nausea or vomiting.  Denies fever or chills.  Denies any other associated symptoms. No new medications. Has history of hypothyroidism on Synthroid .  Constipation Pertinent negatives include no abdominal pain, fever, melena, nausea or vomiting.     Prior to Admission medications   Medication Sig Start Date End Date Taking? Authorizing Provider  azelastine  (ASTELIN ) 0.1 % nasal spray Place 2 sprays into both nostrils 2 (two) times daily. Use in each nostril as directed 01/23/23  Yes Joshua Debby CROME, MD  Boswellia-Glucosamine-Vit D (OSTEO BI-FLEX ONE PER DAY PO) Take by mouth.   Yes [provider]  diltiazem  (CARDIZEM  CD) 120 MG 24 hr capsule Take 1 capsule (120 mg total) by mouth daily. 07/12/23  Yes Joshua Debby CROME, MD  ferrous sulfate  324 MG TBEC Take 324 mg by mouth daily.   Yes [provider]  fluticasone  (FLONASE ) 50 MCG/ACT nasal spray SHAKE LIQUID AND USE 2 SPRAYS IN EACH NOSTRIL DAILY Patient taking differently: Place 1 spray into both nostrils as needed for allergies. 07/06/22  Yes Joshua Debby CROME, MD  levothyroxine  (SYNTHROID ) 112 MCG tablet Take 1 tablet (112 mcg total) by mouth daily before breakfast. Annual appt due in Sept must see provider  for future refills 07/12/23  Yes Joshua Debby CROME, MD  magnesium  oxide (MAG-OX) 400 MG tablet Take 400 mg by mouth daily.   Yes [provider]  Misc Natural Products ([facility] PROSTATE MAX + PO) Take 1 tablet by mouth 2 (two) times daily.   Yes [provider]  Pitavastatin  Calcium  1 MG TABS TAKE 1 TABLET(1 MG) BY MOUTH DAILY 09/27/23  Yes Joshua Debby CROME, MD  rivaroxaban  (XARELTO ) 20 MG TABS tablet Take 1 tablet (20 mg total) by mouth daily with supper. 12/15/22  Yes Lavona Agent, MD    Allergies  Allergen Reactions   Atorvastatin Other (See Comments)    leg cramps   Rosuvastatin Other (See Comments)    myalgias    Patient Active Problem List   Diagnosis Date Noted   PAF (paroxysmal atrial fibrillation) (HCC) 07/05/2023   Encounter for general adult medical examination with abnormal findings 08/29/2022   Chronic hyperglycemia 03/16/2021   B12 nutritional deficiency 03/16/2021   Olecranon bursitis of left elbow 09/10/2020   Lumbar radiculopathy, right 09/18/2019   AC (acromioclavicular) arthritis 10/11/2018   Routine general medical examination at a health care facility 12/07/2016   Senile purpura 01/14/2015   Arthritis of sacroiliac joint 08/27/2014   Renal cyst 05/11/2014   CMC arthritis, thumb, degenerative 07/24/2013   Prostate nodule 12/15/2011   Osteoarthritis of hip 10/06/2009   Hypothyroidism 10/15/2008   Hyperlipidemia with target LDL less than 130 03/03/2008   Allergic rhinitis 03/03/2008  Past Medical History:  Diagnosis Date   Allergic rhinitis    Cancer (HCC)    SKIN CANCER ON LIP (30 YR AGO)   DDD (degenerative disc disease), cervical    Hyperlipidemia    Hypothyroidism    PONV (postoperative nausea and vomiting)    2016 BACK SURGERY, IN RECOVERY HE HAD N/V    Past Surgical History:  Procedure Laterality Date   AMPUTATED Right 1961   TRAUMATIC AMPUTATED FINGERS RIGHT    BACK SURGERY  2016   TONSILLECTOMY     TRANSFORAMINAL LUMBAR  INTERBODY FUSION (TLIF) WITH PEDICLE SCREW FIXATION 1 LEVEL Right 11/29/2017   Procedure: RIGHT SIDED LUMBAR 4-5 TRANSFORAMINAL LUMBAR INTERBODY FUSION WITH INSTRUMENTATION AND ALLOGRAFT;  Surgeon: Beuford Anes, MD;  Location: [facility] OR;  Service: Orthopedics;  Laterality: Right;    Social History   Socioeconomic History   Marital status: Married    Spouse name: Not on file   Number of children: Not on file   Years of education: Not on file   Highest education level: Not on file  Occupational History   Occupation: Retired Scientist, research (life sciences): [employer]   Occupation: Physicist, medical bus  Tobacco Use   Smoking status: Never   Smokeless tobacco: Never  Vaping Use   Vaping status: Never Used  Substance and Sexual Activity   Alcohol use: No   Drug use: No   Sexual activity: Not Currently  Other Topics Concern   Not on file  Social History Narrative   Regular Exercise -  YES         Social Drivers of Health   Financial Resource Strain: Low Risk  (12/28/2022)   Overall Financial Resource Strain (CARDIA)    Difficulty of Paying Living Expenses: Not hard at all  Food Insecurity: No Food Insecurity (12/28/2022)   Hunger Vital Sign    Worried About Running Out of Food in the Last Year: Never true    Ran Out of Food in the Last Year: Never true  Transportation Needs: No Transportation Needs (12/28/2022)   PRAPARE - Administrator, Civil Service (Medical): No    Lack of Transportation (Non-Medical): No  Physical Activity: Sufficiently Active (12/28/2022)   Exercise Vital Sign    Days of Exercise per Week: 5 days    Minutes of Exercise per Session: 30 min  Stress: No Stress Concern Present (12/28/2022)   [employer] of Occupational Health - Occupational Stress Questionnaire    Feeling of Stress : Not at all  Social Connections: Socially Integrated (12/28/2022)   Social Connection and Isolation Panel    Frequency of Communication with Friends and  Family: More than three times a week    Frequency of Social Gatherings with Friends and Family: More than three times a week    Attends Religious Services: More than 4 times per year    Active Member of [employer] or Organizations: No    Attends Engineer, structural: More than 4 times per year    Marital Status: Married  Catering manager Violence: Not At Risk (12/28/2022)   Humiliation, Afraid, Rape, and Kick questionnaire    Fear of Current or Ex-Partner: No    Emotionally Abused: No    Physically Abused: No    Sexually Abused: No    Family History  Problem Relation Age of Onset   Diabetes Mother    Coronary artery disease Father 20       Fatal MI  Diabetes Father    Coronary artery disease Other    Breast cancer Other      Review of Systems  Constitutional:  Negative for chills and fever.  HENT:  Negative for congestion and sore throat.   Respiratory: Negative.  Negative for cough and shortness of breath.   Cardiovascular: Negative.  Negative for chest pain and palpitations.  Gastrointestinal:  Positive for constipation. Negative for abdominal pain, blood in stool, melena, nausea and vomiting.  Genitourinary: Negative.  Negative for dysuria and hematuria.  Neurological:  Negative for dizziness and headaches.  All other systems reviewed and are negative.   Today's Vitals   11/01/23 1412  BP: 118/72  Pulse: (!) 53  Temp: 97.6 F (36.4 C)  TempSrc: Oral  SpO2: 97%  Weight: 202 lb (91.6 kg)  Height: 5' 10 (1.778 m)   Body mass index is 28.98 kg/m.   Physical Exam Vitals reviewed.  Constitutional:      Appearance: Normal appearance.  HENT:     Head: Normocephalic.     Mouth/Throat:     Mouth: Mucous membranes are moist.     Pharynx: Oropharynx is clear.  Eyes:     Extraocular Movements: Extraocular movements intact.  Cardiovascular:     Rate and Rhythm: Normal rate and regular rhythm.     Pulses: Normal pulses.     Heart sounds: Normal heart sounds.   Pulmonary:     Effort: Pulmonary effort is normal.     Breath sounds: Normal breath sounds.  Abdominal:     General: There is no distension.     Palpations: Abdomen is soft. There is no mass.     Tenderness: There is no abdominal tenderness. There is no guarding.  Skin:    General: Skin is warm and dry.  Neurological:     Mental Status: He is alert and oriented to person, place, and time.  Psychiatric:        Mood and Affect: Mood normal.        Behavior: Behavior normal.      ASSESSMENT & PLAN: Problem List Items Addressed This Visit       Other   Acute constipation - Primary   Clinically stable.  No red flag signs or symptoms. Benign physical examination. Differential diagnosis discussed. Advised to stop iron supplements for several days. Constipation management discussed. Advised to stay well-hydrated and increase amount of fiber in his daily diet Recommend [place]' milk of magnesia twice a day for couple of days Start Linzess  145 mg daily for at least a week Use saline enema today ED precautions given Advised to follow-up with PCP if no better or worse during the next several days.      Relevant Medications   linaclotide  (LINZESS ) 145 MCG CAPS capsule   Other Relevant Orders   TSH   Comprehensive metabolic panel with GFR   CBC with Differential/Platelet   Patient Instructions  Recommend [place]' milk of magnesia today.  Follow instructions on the bottle Start Linzess  145 mg daily for 7 days Use saline enema once today. Stay well-hydrated and increase amount of fiber in your diet.  Constipation, Adult Constipation is when a person has trouble pooping (having a bowel movement). When you have this condition, you may poop fewer than 3 times a week. Your poop (stool) may also be dry, hard, or bigger than normal. Follow these instructions at home: Eating and drinking  Eat foods that have a lot of fiber, such as: Fresh fruits and  vegetables. Whole  grains. Beans. Eat less of foods that are low in fiber and high in fat and sugar, such as: [place] fries. Hamburgers. Cookies. Candy. Soda. Drink enough fluid to keep your pee (urine) pale yellow. General instructions Exercise regularly or as told by your doctor. Try to do 150 minutes of exercise each week. Go to the restroom when you feel like you need to poop. Do not hold it in. Take over-the-counter and prescription medicines only as told by your doctor. These include any fiber supplements. When you poop: Do deep breathing while relaxing your lower belly (abdomen). Relax your pelvic floor. The pelvic floor is a group of muscles that support the rectum, bladder, and intestines (as well as the uterus in women). Watch your condition for any changes. Tell your doctor if you notice any. Keep all follow-up visits as told by your doctor. This is important. Contact a doctor if: You have pain that gets worse. You have a fever. You have not pooped for 4 days. You vomit. You are not hungry. You lose weight. You are bleeding from the opening of the butt (anus). You have thin, pencil-like poop. Get help right away if: You have a fever, and your symptoms suddenly get worse. You leak poop or have blood in your poop. Your belly feels hard or bigger than normal (bloated). You have very bad belly pain. You feel dizzy or you faint. Summary Constipation is when a person poops fewer than 3 times a week, has trouble pooping, or has poop that is dry, hard, or bigger than normal. Eat foods that have a lot of fiber. Drink enough fluid to keep your pee (urine) pale yellow. Take over-the-counter and prescription medicines only as told by your doctor. These include any fiber supplements. This information is not intended to replace advice given to you by your health care provider. Make sure you discuss any questions you have with your health care provider. Document Revised: 12/08/2021 Document Reviewed:  12/08/2021 Elsevier Patient Education  2024 Elsevier Inc.    Emil Schaumann, MD [facility]

## 2023-11-01 NOTE — Assessment & Plan Note (Signed)
 Clinically stable.  No red flag signs or symptoms. Benign physical examination. Differential diagnosis discussed. Advised to stop iron supplements for several days. Constipation management discussed. Advised to stay well-hydrated and increase amount of fiber in his daily diet Recommend [place]' milk of magnesia twice a day for couple of days Start Linzess  145 mg daily for at least a week Use saline enema today ED precautions given Advised to follow-up with PCP if no better or worse during the next several days.

## 2023-11-01 NOTE — Progress Notes (Signed)
 Remote Loop Recorder Transmission

## 2023-11-02 ENCOUNTER — Ambulatory Visit: Payer: Self-pay | Admitting: Emergency Medicine

## 2023-11-03 NOTE — Telephone Encounter (Signed)
 Noted and patient has been seen

## 2023-11-16 NOTE — Progress Notes (Signed)
 Remote Loop Recorder Transmission

## 2023-11-17 ENCOUNTER — Ambulatory Visit: Payer: Self-pay

## 2023-11-17 NOTE — Telephone Encounter (Signed)
 FYI

## 2023-11-17 NOTE — Telephone Encounter (Signed)
  FYI Only or Action Required?: FYI only for provider.- Patient will follow up with sports med  Patient was last seen in primary care on 11/01/2023 by Purcell Emil Schanz, MD.  Called Nurse Triage reporting Shoulder Pain.  Symptoms began several weeks ago.  Interventions attempted: OTC medications: topical cream, cannot recall name.  Symptoms are: unchanged.  Triage Disposition: See PCP Within 2 Weeks  Patient/caregiver understands and will follow disposition?: Yes  Copied from CRM [id number]. Topic: Clinical - Red Word Triage >> Nov 17, 2023  9:48 AM Dedra NOVAK wrote: Red Word that prompted transfer to Nurse Triage: Pt rotary cuff swelling. He said he's had rotary cuff issues for years but it has been swollen and painful the past week. Warm transfer to NT. Reason for Disposition  [1] MILD pain (e.g., does not interfere with normal activities) AND [2] present > 7 days  Answer Assessment - Initial Assessment Questions Patient called was attempting to call sports med, correct phone number provided.  1. ONSET: When did the pain start?     3-4 weeks ago 2. LOCATION: Where is the pain located?     Right shoulder rotator cuff pain 3. PAIN: How bad is the pain? (Scale 1-10; or mild, moderate, severe)     4/10- on and off 4. WORK OR EXERCISE: Has there been any recent work or exercise that involved this part of the body?     Denies, does not recall any injury or over use 5. CAUSE: What do you think is causing the shoulder pain?     20 year history of rotator cuff pain 6. OTHER SYMPTOMS: Do you have any other symptoms? (e.g., neck pain, swelling, rash, fever, numbness, weakness)     denies 7. PREGNANCY: Is there any chance you are pregnant? When was your last menstrual period?     N/a  Protocols used: Shoulder Pain-A-AH

## 2023-11-21 ENCOUNTER — Ambulatory Visit

## 2023-11-21 DIAGNOSIS — G459 Transient cerebral ischemic attack, unspecified: Secondary | ICD-10-CM

## 2023-11-21 LAB — CUP PACEART REMOTE DEVICE CHECK
Date Time Interrogation Session: 20251013233613
Implantable Pulse Generator Implant Date: 20241219

## 2023-11-22 NOTE — Progress Notes (Signed)
 Remote Loop Recorder Transmission

## 2023-11-22 NOTE — Progress Notes (Unsigned)
 [name] [address] [place] [address] Phone: [phone]   Assessment and Plan:     1. Chronic right shoulder pain (Primary) 2. Primary osteoarthritis of right shoulder 3. Nontraumatic tear of right rotator cuff, unspecified tear extent -Chronic with exacerbation, subsequent visit - Consistent with flare of severe glenohumeral osteoarthritis, rotator cuff tendinopathy with likely rotator cuff tear - X-ray obtained in clinic.  My interpretation: No acute fracture or dislocation.  Severely decreased glenohumeral joint space.  High riding humeral head, likely indicating partial to complete rotator cuff tear - Patient elected for subacromial CSI.  Tolerated well per note below.  Patient had increased bleeding risk with chronic anticoagulation on Xarelto . - I do not recommend p.o. NSAIDs or prednisone  with past medical history of PAF on chronic anticoagulation with Xarelto  - Use Tylenol  500 to 1000 mg tablets 2-3 times a day for day-to-day pain relief -Start gentle HEP for rotator cuff  Procedure: Subacromial Injection Side: Right  Risks explained and consent was given verbally. The site was cleaned with alcohol prep. A steroid injection was performed from posterior approach using 2mL of 1% lidocaine  without epinephrine  and 1mL of kenalog  40mg /ml. This was well tolerated.  Needle was removed, hemostasis achieved, and post injection instructions were explained.   Pt was advised to call or return to clinic if these symptoms worsen or fail to improve as anticipated.    15 additional minutes spent for educating Therapeutic Home Exercise Program.  This included exercises focusing on stretching, strengthening, with focus on eccentric aspects.   Long term goals include an improvement in range of motion, strength, endurance as well as avoiding reinjury. Patient's frequency would include in 1-2 times a day, 3-5 times a week for a duration of  6-12 weeks. Proper technique shown and discussed handout in great detail with ATC.  All questions were discussed and answered.    Pertinent previous records reviewed include none   Follow Up: 1 week for reevaluation.  If no significant improvement, would consider intra-articular right shoulder CSI   Subjective:   I, Anaysia Germer, am serving as a Neurosurgeon for Doctor Morene Mace  Chief Complaint: right shoulder pain   HPI:   11/23/2023 Patient is a 83 year old male with right shoulder pain. Patient states pain started 4 months ago. No MOI. Flare of shoulder pain . Dr. Claudene dx rotar partial tear 20 something years ago. Has been using creams that dont help. No meds. Pain radiates down the arm. Decreased ROM . Isn't able to sleep through the night. No decrease in grip strength.    Relevant Historical Information: PAF on chronic anticoagulation with Xarelto , hypothyroidism  Additional pertinent review of systems negative.   Current Outpatient Medications:    azelastine  (ASTELIN ) 0.1 % nasal spray, Place 2 sprays into both nostrils 2 (two) times daily. Use in each nostril as directed, Disp: 90 mL, Rfl: 1   Boswellia-Glucosamine-Vit D (OSTEO BI-FLEX ONE PER DAY PO), Take by mouth., Disp: , Rfl:    diltiazem  (CARDIZEM  CD) 120 MG 24 hr capsule, Take 1 capsule (120 mg total) by mouth daily., Disp: 90 capsule, Rfl: 1   ferrous sulfate  324 MG TBEC, Take 324 mg by mouth daily., Disp: , Rfl:    fluticasone  (FLONASE ) 50 MCG/ACT nasal spray, SHAKE LIQUID AND USE 2 SPRAYS IN EACH NOSTRIL DAILY (Patient taking differently: Place 1 spray into both nostrils as needed for allergies.), Disp: 48 g, Rfl: 1  levothyroxine  (SYNTHROID ) 112 MCG tablet, Take 1 tablet (112 mcg total) by mouth daily before breakfast. Annual appt due in Sept must see provider for future refills, Disp: 90 tablet, Rfl: 1   linaclotide  (LINZESS ) 145 MCG CAPS capsule, Take 1 capsule (145 mcg total) by mouth daily before  breakfast for 7 days., Disp: 7 capsule, Rfl: 1   magnesium  oxide (MAG-OX) 400 MG tablet, Take 400 mg by mouth daily., Disp: , Rfl:    Misc Natural Products (CVS PROSTATE MAX + PO), Take 1 tablet by mouth 2 (two) times daily., Disp: , Rfl:    Pitavastatin  Calcium  1 MG TABS, TAKE 1 TABLET(1 MG) BY MOUTH DAILY, Disp: 90 tablet, Rfl: 1   rivaroxaban  (XARELTO ) 20 MG TABS tablet, Take 1 tablet (20 mg total) by mouth daily with supper., Disp: 30 tablet, Rfl: 11   Objective:     Vitals:   11/23/23 1500  BP: 100/68  Pulse: (!) 55  SpO2: 100%  Weight: 202 lb (91.6 kg)  Height: 5' 10 (1.778 m)      Body mass index is 28.98 kg/m.    Physical Exam:    Gen: Appears well, nad, nontoxic and pleasant Neuro:sensation intact, strength is 5/5 with df/pf/inv/ev, muscle tone wnl Skin: no suspicious lesion or defmority Psych: A&O, appropriate mood and affect  Right shoulder:  No deformity, swelling or muscle wasting No scapular winging FF  80, abd  80, int 20, ext 70 NTTP over the [place], clavicle, ac, coracoid, biceps groove, humerus, deltoid, trapezius, cervical spine Equivocal special testing by limited ROM Negative Spurling's test bilat FROM of neck    Electronically signed by:  Odis Mace [name] 3:18 PM 11/23/23

## 2023-11-23 ENCOUNTER — Ambulatory Visit

## 2023-11-23 ENCOUNTER — Encounter

## 2023-11-23 ENCOUNTER — Ambulatory Visit: Admitting: Sports Medicine

## 2023-11-23 VITALS — BP 100/68 | HR 55 | Ht 70.0 in | Wt 202.0 lb

## 2023-11-23 DIAGNOSIS — G8929 Other chronic pain: Secondary | ICD-10-CM

## 2023-11-23 DIAGNOSIS — M75101 Unspecified rotator cuff tear or rupture of right shoulder, not specified as traumatic: Secondary | ICD-10-CM

## 2023-11-23 DIAGNOSIS — M19011 Primary osteoarthritis, right shoulder: Secondary | ICD-10-CM

## 2023-11-23 DIAGNOSIS — M25511 Pain in right shoulder: Secondary | ICD-10-CM

## 2023-11-23 NOTE — Patient Instructions (Signed)
 Shoulder HEP   Tylenol  [phone] mg 2-3 times a day for pain relief   1 week follow up

## 2023-11-27 ENCOUNTER — Telehealth: Payer: Self-pay | Admitting: Sports Medicine

## 2023-11-27 NOTE — Telephone Encounter (Signed)
 Patient called and would like to come for his injection sooner than that appt on Thursday afternoon because he is going out of town. He says it is a very particular injection due to his AFib. The only sooner appt we had for him is with Dr. Joane on Wednesday morning as of right now and I wanted to ensure that this okay to schedule with Dr. Joane. I also have the patient on the wait list to call if someone cancels so that he could still see Dr. Leonce if possible. FYI/please advise.

## 2023-11-27 NOTE — Telephone Encounter (Signed)
 Was able to schedule patient for tomorrow with Dr. Leonce instead of thursday. Please confirm with timing that this appointment for tomorrow is okay.

## 2023-11-27 NOTE — Progress Notes (Unsigned)
 [name] [address] [place] [address] Phone: [phone]   Assessment and Plan:     1. Chronic right shoulder pain (Primary) 2. Primary osteoarthritis of right shoulder 3. Nontraumatic tear of right rotator cuff, unspecified tear extent -Chronic with exacerbation, subsequent visit - Consistent with flare of severe glenohumeral osteoarthritis, rotator cuff tendinopathy with likely rotator cuff tear.  Patient had mild relief from subacromial CSI on 11/23/2023 - Patient elected for large-volume intra-articular glenohumeral CSI today.  Tolerated well per note below.  Patient increased risk of bleeding due to chronic anticoagulation on Xarelto  - I do not recommend p.o. NSAIDs or prednisone  with past medical history of PAF on chronic anticoagulation with Xarelto  - Use Tylenol  500 to 1000 mg tablets 2-3 times a day for day-to-day pain relief - Continue HEP for shoulder and rotator cuff   Procedure: Ultrasound Guided Glenohumeral Joint Injection Side: Right Diagnosis: Flare of osteoarthritis US  Indication:  - accuracy is paramount for diagnosis - to ensure therapeutic efficacy or procedural success - to reduce procedural risk  After explaining the procedure, viable alternatives, risks, and answering any questions, consent was given verbally. The site was cleaned with chlorhexidine  prep. An ultrasound transducer was placed on the posterior shoulder.  The posterior capsule, labrum, and infraspinatus were identified.  A steroid injection was performed under ultrasound guidance with sterile technique using  4ml of 1% lidocaine  without epinephrine  and 80 mg of methylprednisone 40 mg/ML. This was well tolerated and resulted in  relief.  Needle was removed and dressing placed and post injection instructions were given including  a discussion of likely return of pain today after the anesthetic wears off (with the possibility of worsened pain)  until the steroid starts to work in 1-3 days.   Pt was advised to call or return to clinic if these symptoms worsen or fail to improve as anticipated.   Images permanently stored.   Pertinent previous records reviewed include none   Follow Up: 4 to 6 weeks for reevaluation.  Could consider advanced imaging versus physical therapy versus repeat CSI   Subjective:   I, Keith [name], am serving as a Neurosurgeon for Keith [name]   Chief Complaint: right shoulder pain    HPI:    11/23/2023 Patient is a 83 year old male with right shoulder pain. Patient states pain started 4 months ago. No MOI. Flare of shoulder pain . Dr. Claudene dx rotar partial tear 20 something years ago. Has been using creams that dont help. No meds. Pain radiates down the arm. Decreased ROM . Isn't able to sleep through the night. No decrease in grip strength.    11/28/2023 Patient states shoulder was good Friday. Decreased ROM doesn't think enough medicine was put in last time    Relevant Historical Information: PAF on chronic anticoagulation with Xarelto , hypothyroidism  Additional pertinent review of systems negative.   Current Outpatient Medications:    azelastine  (ASTELIN ) 0.1 % nasal spray, Place 2 sprays into both nostrils 2 (two) times daily. Use in each nostril as directed, Disp: 90 mL, Rfl: 1   Boswellia-Glucosamine-Vit D (OSTEO BI-FLEX ONE PER DAY PO), Take by mouth., Disp: , Rfl:    diltiazem  (CARDIZEM  CD) 120 MG 24 hr capsule, Take 1 capsule (120 mg total) by mouth daily., Disp: 90 capsule, Rfl: 1   ferrous sulfate  324 MG TBEC, Take 324 mg by mouth daily., Disp: , Rfl:    fluticasone  (FLONASE ) 50 MCG/ACT nasal  spray, SHAKE LIQUID AND USE 2 SPRAYS IN EACH NOSTRIL DAILY (Patient taking differently: Place 1 spray into both nostrils as needed for allergies.), Disp: 48 g, Rfl: 1   levothyroxine  (SYNTHROID ) 112 MCG tablet, Take 1 tablet (112 mcg total) by mouth daily before breakfast. Annual appt due in  Sept must see provider for future refills, Disp: 90 tablet, Rfl: 1   linaclotide  (LINZESS ) 145 MCG CAPS capsule, Take 1 capsule (145 mcg total) by mouth daily before breakfast for 7 days., Disp: 7 capsule, Rfl: 1   magnesium  oxide (MAG-OX) 400 MG tablet, Take 400 mg by mouth daily., Disp: , Rfl:    Misc Natural Products ([employer] PROSTATE MAX + PO), Take 1 tablet by mouth 2 (two) times daily., Disp: , Rfl:    Pitavastatin  Calcium  1 MG TABS, TAKE 1 TABLET(1 MG) BY MOUTH DAILY, Disp: 90 tablet, Rfl: 1   rivaroxaban  (XARELTO ) 20 MG TABS tablet, Take 1 tablet (20 mg total) by mouth daily with supper., Disp: 30 tablet, Rfl: 11   Objective:     Vitals:   11/28/23 1010  BP: 120/80  Pulse: (!) 52  SpO2: 97%  Weight: 202 lb (91.6 kg)  Height: 5' 10 (1.778 m)      Body mass index is 28.98 kg/m.    Physical Exam:    Gen: Appears well, nad, nontoxic and pleasant Neuro:sensation intact, strength is 5/5 with df/pf/inv/ev, muscle tone wnl Skin: no suspicious lesion or defmority Psych: A&O, appropriate mood and affect   Right shoulder:  No deformity, swelling or muscle wasting No scapular winging FF  80, abd  80, int 20, ext 70 NTTP over the [place], clavicle, ac, coracoid, biceps groove, humerus, deltoid, trapezius, cervical spine Equivocal special testing by limited ROM Negative Spurling's test bilat FROM of neck     Electronically signed by:  Odis [name] D.CLEMENTEEN [name] 10:17 AM 11/28/23

## 2023-11-28 ENCOUNTER — Other Ambulatory Visit: Payer: Self-pay

## 2023-11-28 ENCOUNTER — Ambulatory Visit: Payer: Self-pay | Admitting: Sports Medicine

## 2023-11-28 ENCOUNTER — Ambulatory Visit: Admitting: Sports Medicine

## 2023-11-28 VITALS — BP 120/80 | HR 52 | Ht 70.0 in | Wt 202.0 lb

## 2023-11-28 DIAGNOSIS — M19011 Primary osteoarthritis, right shoulder: Secondary | ICD-10-CM | POA: Diagnosis not present

## 2023-11-28 DIAGNOSIS — G8929 Other chronic pain: Secondary | ICD-10-CM

## 2023-11-28 DIAGNOSIS — M25511 Pain in right shoulder: Secondary | ICD-10-CM | POA: Diagnosis not present

## 2023-11-28 DIAGNOSIS — M75101 Unspecified rotator cuff tear or rupture of right shoulder, not specified as traumatic: Secondary | ICD-10-CM

## 2023-11-28 NOTE — Patient Instructions (Signed)
As needed follow up  Tylenol [phone] mg 2-3 times a day for pain relief

## 2023-11-29 ENCOUNTER — Ambulatory Visit: Payer: Self-pay | Admitting: Cardiovascular Disease

## 2023-11-29 ENCOUNTER — Ambulatory Visit: Admitting: Family Medicine

## 2023-11-30 ENCOUNTER — Ambulatory Visit: Admitting: Sports Medicine

## 2023-12-06 ENCOUNTER — Other Ambulatory Visit: Payer: Self-pay | Admitting: Cardiology

## 2023-12-06 NOTE — Telephone Encounter (Signed)
 Prescription refill request for Xarelto  received.  Indication:afib Last office visit:5/25 Weight:91.6  kg Age:83 Scr:1.04  9/25 CrCl:70.95  ml/min  Prescription refilled

## 2023-12-14 DIAGNOSIS — E785 Hyperlipidemia, unspecified: Secondary | ICD-10-CM | POA: Diagnosis not present

## 2023-12-14 DIAGNOSIS — I4891 Unspecified atrial fibrillation: Secondary | ICD-10-CM | POA: Diagnosis not present

## 2023-12-14 DIAGNOSIS — I739 Peripheral vascular disease, unspecified: Secondary | ICD-10-CM | POA: Diagnosis not present

## 2023-12-14 DIAGNOSIS — M1990 Unspecified osteoarthritis, unspecified site: Secondary | ICD-10-CM | POA: Diagnosis not present

## 2023-12-14 DIAGNOSIS — D6869 Other thrombophilia: Secondary | ICD-10-CM | POA: Diagnosis not present

## 2023-12-14 DIAGNOSIS — K5900 Constipation, unspecified: Secondary | ICD-10-CM | POA: Diagnosis not present

## 2023-12-14 DIAGNOSIS — D696 Thrombocytopenia, unspecified: Secondary | ICD-10-CM | POA: Diagnosis not present

## 2023-12-14 DIAGNOSIS — R32 Unspecified urinary incontinence: Secondary | ICD-10-CM | POA: Diagnosis not present

## 2023-12-14 DIAGNOSIS — N529 Male erectile dysfunction, unspecified: Secondary | ICD-10-CM | POA: Diagnosis not present

## 2023-12-18 ENCOUNTER — Other Ambulatory Visit: Payer: Self-pay

## 2023-12-18 ENCOUNTER — Telehealth: Payer: Self-pay | Admitting: Family Medicine

## 2023-12-18 ENCOUNTER — Telehealth: Payer: Self-pay

## 2023-12-18 DIAGNOSIS — M5416 Radiculopathy, lumbar region: Secondary | ICD-10-CM

## 2023-12-18 NOTE — Telephone Encounter (Signed)
 Copied from CRM #[id number]. Topic: General - Other >> Dec 18, 2023 11:09 AM Jasmin G wrote: Reason for CRM: Pt requested a call back at [phone] to let him know what's his blood type.

## 2023-12-18 NOTE — Telephone Encounter (Signed)
 Patient called asking if another epidural could be ordered for him to [facility]? Please advise.

## 2023-12-18 NOTE — Telephone Encounter (Signed)
 Patients blood type has been given to him.

## 2023-12-20 ENCOUNTER — Telehealth (HOSPITAL_BASED_OUTPATIENT_CLINIC_OR_DEPARTMENT_OTHER): Payer: Self-pay | Admitting: *Deleted

## 2023-12-20 NOTE — Telephone Encounter (Signed)
   Pre-operative Risk Assessment    Patient Name: Keith [name]  DOB: Feb 06, 1941 MRN: [id number]   Date of last office visit: 07/07/2023 Date of next office visit: None  Request for Surgical Clearance    Procedure: Lumbar Epidural  Date of Surgery:  Clearance TBD                                 Surgeon:  [facility] Surgeon's Group or Practice Name:  [facility] Phone number:  [phone] Fax number:  [phone]   Type of Clearance Requested:   - Medical  - Pharmacy:  Hold Rivaroxaban  (Xarelto ) 2 doses prior to procedure.   Type of Anesthesia:  Not Indicated   Additional requests/questions:    Signed, Edsel Grayce Sanders   12/20/2023, 3:30 PM

## 2023-12-22 ENCOUNTER — Ambulatory Visit (INDEPENDENT_AMBULATORY_CARE_PROVIDER_SITE_OTHER)

## 2023-12-22 DIAGNOSIS — I480 Paroxysmal atrial fibrillation: Secondary | ICD-10-CM | POA: Diagnosis not present

## 2023-12-24 LAB — CUP PACEART REMOTE DEVICE CHECK
Date Time Interrogation Session: 20251113233018
Implantable Pulse Generator Implant Date: 20241219

## 2023-12-25 ENCOUNTER — Encounter

## 2023-12-26 NOTE — Progress Notes (Signed)
 Remote Loop Recorder Transmission

## 2023-12-26 NOTE — Telephone Encounter (Signed)
   Name: Keith [name]  DOB: 11/14/1940  MRN: [id number]  Primary Cardiologist: Lynwood Schilling, MD   Preoperative team, please contact this patient and set up a phone call appointment for further preoperative risk assessment. Please obtain consent and complete medication review. Thank you for your help.  I confirm that guidance regarding antiplatelet and oral anticoagulation therapy has been completed and, if necessary, noted below.  Per office protocol, patient can hold Xarelto  for 3 days prior to procedure.    I also confirmed the patient resides in the state of [place] . As per [facility] Medical Board telemedicine laws, the patient must reside in the state in which the provider is licensed.   Jon Nat Hails, PA 12/26/2023, 2:52 PM [facility]

## 2023-12-26 NOTE — Telephone Encounter (Signed)
 Patient with diagnosis of afib on Xarelto  for anticoagulation.    Procedure:  Lumbar Epidural  Date of procedure: TBD   CHA2DS2-VASc Score = 2   This indicates a 2.2% annual risk of stroke. The patient's score is based upon: CHF History: 0 HTN History: 0 Diabetes History: 0 Stroke History: 0 Vascular Disease History: 0 Age Score: 2 Gender Score: 0      CrCl 62 ml/min Platelet count 180  Referred for loop recorder implantation by Dr. Lavona since he has been having some sporadic episodes of unilateral numbness and changes in vision. CHA2DS2-VASc score is 2, based on age alone. MRI of the brain in 2022 did not show evidence of frank stroke although there were some changes of atrophy and chronic microvascular ischemic changes in the white matter.    Patient has not had an Afib/aflutter ablation in the last 3 months, DCCV within the last 4 weeks or a watchman implanted in the last 45 days   Per office protocol, patient can hold Xarelto  for 3 days prior to procedure.    **This guidance is not considered finalized until pre-operative APP has relayed final recommendations.**

## 2023-12-29 ENCOUNTER — Ambulatory Visit: Payer: Medicare PPO

## 2023-12-29 VITALS — Ht 70.0 in | Wt 202.0 lb

## 2023-12-29 DIAGNOSIS — Z0000 Encounter for general adult medical examination without abnormal findings: Secondary | ICD-10-CM

## 2023-12-29 NOTE — Progress Notes (Signed)
 Chief Complaint  Patient presents with   Medicare Wellness     Subjective:   Keith [name] is a 83 y.o. male who presents for a Medicare Annual Wellness Visit.  Allergies (verified) Atorvastatin and Rosuvastatin   History: Past Medical History:  Diagnosis Date   Allergic rhinitis    Cancer (HCC)    SKIN CANCER ON LIP (30 YR AGO)   DDD (degenerative disc disease), cervical    Hyperlipidemia    Hypothyroidism    PONV (postoperative nausea and vomiting)    2016 BACK SURGERY, IN RECOVERY HE HAD N/V   Past Surgical History:  Procedure Laterality Date   AMPUTATED Right 1961   TRAUMATIC AMPUTATED FINGERS RIGHT    BACK SURGERY  2016   TONSILLECTOMY     TRANSFORAMINAL LUMBAR INTERBODY FUSION (TLIF) WITH PEDICLE SCREW FIXATION 1 LEVEL Right 11/29/2017   Procedure: RIGHT SIDED LUMBAR 4-5 TRANSFORAMINAL LUMBAR INTERBODY FUSION WITH INSTRUMENTATION AND ALLOGRAFT;  Surgeon: Beuford Anes, MD;  Location: [facility] OR;  Service: Orthopedics;  Laterality: Right;   Family History  Problem Relation Age of Onset   Diabetes Mother    Coronary artery disease Father 31       Fatal MI   Diabetes Father    Coronary artery disease Other    Breast cancer Other    Social History   Occupational History   Occupation: Retired Scientist, 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 [employer]   Occupation: Physicist, Medical bus  Tobacco Use   Smoking status: Never   Smokeless tobacco: Never  Vaping Use   Vaping status: Never Used  Substance and Sexual Activity   Alcohol use: No   Drug use: No   Sexual activity: Not Currently   Tobacco Counseling Counseling given: Not Answered  SDOH Screenings   Food Insecurity: No Food Insecurity (12/28/2022)  Housing: Low Risk  (12/28/2022)  Transportation Needs: No Transportation Needs (12/28/2022)  Utilities: Not At Risk (12/28/2022)  Alcohol Screen: Low Risk  (07/01/2021)  Depression (PHQ2-9): Low Risk  (11/01/2023)  Financial Resource Strain: Low Risk  (12/28/2022)   Physical Activity: Sufficiently Active (12/28/2022)  Social Connections: Socially Integrated (12/28/2022)  Stress: No Stress Concern Present (12/28/2022)  Tobacco Use: Low Risk  (12/29/2023)  Health Literacy: Adequate Health Literacy (12/28/2022)   See flowsheets for full screening details  Depression Screen PHQ 2 & 9 Depression Scale- Over the past 2 weeks, how often have you been bothered by any of the following problems? Little interest or pleasure in doing things: 0 Feeling down, depressed, or hopeless (PHQ Adolescent also includes...irritable): 0 PHQ-2 Total Score: 0     Goals Addressed             This Visit's Progress    Patient Stated   On track    My goal is to stay safe in my home and not to fall.       Visit info / Clinical Intake: Medicare Wellness Visit Type:: Subsequent Annual Wellness Visit Persons participating in visit:: patient Medicare Wellness Visit Mode:: Telephone If telephone:: video declined Because this visit was a virtual/telehealth visit:: vitals recorded from last visit If Telephone or Video please confirm:: I connected with the patient using audio enabled telemedicine application and verified that I am speaking with the correct person using two identifiers; I discussed the limitations of evaluation and management by telemedicine; The patient expressed understanding and agreed to proceed Patient Location:: home Provider Location:: Office Information given by:: patient Interpreter Needed?: No Pre-visit prep was  completed: no AWV questionnaire completed by patient prior to visit?: no Living arrangements:: with family/others Patient's Overall Health Status Rating: good Typical amount of pain: none Does pain affect daily life?: no Are you currently prescribed opioids?: no  Dietary Habits and Nutritional Risks How many meals a day?: 3 Eats fruit and vegetables daily?: (!) no Most meals are obtained by: eating out; preparing own meals In the  last 2 weeks, have you had any of the following?: none Diabetic:: no  Functional Status Activities of Daily Living (to include ambulation/medication): Independent Ambulation: Independent with device- listed below Home Assistive Devices/Equipment: Eyeglasses Medication Administration: Independent Home Management: Independent Manage your own finances?: yes Primary transportation is: driving Concerns about vision?: (!) yes (need an eye exam/blurry vision) Concerns about hearing?: no  Fall Screening Falls in the past year?: 0 Number of falls in past year: 0 Was there an injury with Fall?: 0 Fall Risk Category Calculator: 0 Patient Fall Risk Level: Low Fall Risk  Fall Risk Patient at Risk for Falls Due to: No Fall Risks Fall risk Follow up: Falls evaluation completed; Falls prevention discussed  Home and Transportation Safety: All rugs have non-skid backing?: yes All stairs or steps have railings?: yes Grab bars in the bathtub or shower?: (!) no Have non-skid surface in bathtub or shower?: yes (pad) Good home lighting?: yes Regular seat belt use?: yes Hospital stays in the last year:: no  Cognitive Assessment Difficulty concentrating, remembering, or making decisions? : yes Will 6CIT or Mini Cog be Completed: yes What year is it?: 0 points What month is it?: 0 points Give patient an address phrase to remember (5 components): [address] [place]        Objective:    Today's Vitals   12/29/23 1133  Weight: 202 lb (91.6 kg)  Height: 5' 10 (1.778 m)   Body mass index is 28.98 kg/m.  Current Medications (verified) Outpatient Encounter Medications as of 12/29/2023  Medication Sig   azelastine  (ASTELIN ) 0.1 % nasal spray Place 2 sprays into both nostrils 2 (two) times daily. Use in each nostril as directed   Boswellia-Glucosamine-Vit D (OSTEO BI-FLEX ONE PER DAY PO) Take by mouth.   diltiazem  (CARDIZEM  CD) 120 MG 24 hr capsule Take 1 capsule (120 mg total) by mouth  daily.   ferrous sulfate  324 MG TBEC Take 324 mg by mouth daily.   fluticasone  (FLONASE ) 50 MCG/ACT nasal spray SHAKE LIQUID AND USE 2 SPRAYS IN EACH NOSTRIL DAILY (Patient taking differently: Place 1 spray into both nostrils as needed for allergies.)   levothyroxine  (SYNTHROID ) 112 MCG tablet Take 1 tablet (112 mcg total) by mouth daily before breakfast. Annual appt due in Sept must see provider for future refills   linaclotide  (LINZESS ) 145 MCG CAPS capsule Take 1 capsule (145 mcg total) by mouth daily before breakfast for 7 days.   magnesium  oxide (MAG-OX) 400 MG tablet Take 400 mg by mouth daily.   Misc Natural Products (CVS PROSTATE MAX + PO) Take 1 tablet by mouth 2 (two) times daily.   Pitavastatin  Calcium  1 MG TABS TAKE 1 TABLET(1 MG) BY MOUTH DAILY   XARELTO  20 MG TABS tablet TAKE 1 TABLET(20 MG) BY MOUTH DAILY WITH SUPPER   No facility-administered encounter medications on file as of 12/29/2023.   Hearing/Vision screen Hearing Screening - Comments:: Denies hearing difficulties   Vision Screening - Comments:: Wears eyeglasses/ Not UTD/Vision Woks Immunizations and Health Maintenance Health Maintenance  Topic Date Due   Zoster Vaccines-  Shingrix (1 of 2) Never done   Influenza Vaccine  09/08/2023   COVID-19 Vaccine (4 - 2025-26 season) 10/09/2023   Medicare Annual Wellness (AWV)  12/28/2023   DTaP/Tdap/Td (3 - Td or Tdap) 07/16/2031   Pneumococcal Vaccine: 50+ Years  Completed   Meningococcal B Vaccine  Aged Out        Assessment/Plan:  This is a routine wellness examination for Dionisios.  Patient Care Team: Joshua Debby CROME, MD as PCP - Diedre Lavona Agent, MD as PCP - Cardiology (Cardiology) Claudene Arthea HERO, DO as Consulting Physician (Sports Medicine)  I have personally reviewed and noted the following in the patient's chart:   Medical and social history Use of alcohol, tobacco or illicit drugs  Current medications and supplements including opioid  prescriptions. Functional ability and status Nutritional status Physical activity Advanced directives List of other physicians Hospitalizations, surgeries, and ER visits in previous 12 months Vitals Screenings to include cognitive, depression, and falls Referrals and appointments  No orders of the defined types were placed in this encounter.  In addition, I have reviewed and discussed with patient certain preventive protocols, quality metrics, and best practice recommendations. A written personalized care plan for preventive services as well as general preventive health recommendations were provided to patient.   Kazue Cerro L Deasiah Hagberg, CMA   12/29/2023   No follow-ups on file.  After Visit Summary: (MyChart) Due to this being a telephonic visit, the after visit summary with patients personalized plan was offered to patient via MyChart   Nurse Notes: Patient is up to date on all health maintenance with no concerns to address today.

## 2023-12-29 NOTE — Patient Instructions (Signed)
 Mr. Keith [name],  Thank you for taking the time for your Medicare Wellness Visit. I appreciate your continued commitment to your health goals. Please review the care plan we discussed, and feel free to reach out if I can assist you further.  Please note that Annual Wellness Visits do not include a physical exam. Some assessments may be limited, especially if the visit was conducted virtually. If needed, we may recommend an in-person follow-up with your provider.  Ongoing Care Seeing your primary care provider every 3 to 6 months helps us  monitor your health and provide consistent, personalized care. Next office visit on 01/11/2024.  Each day, aim for 6 glasses of water, plenty of protein in your diet and try to get up and walk/ stretch every hour for 5-10 minutes at a time.    Referrals If a referral was made during today's visit and you haven't received any updates within two weeks, please contact the referred provider directly to check on the status.  Recommended Screenings:  Health Maintenance  Topic Date Due   Zoster (Shingles) Vaccine (1 of 2) Never done   Flu Shot  09/08/2023   COVID-19 Vaccine (4 - 2025-26 season) 10/09/2023   Medicare Annual Wellness Visit  12/28/2024   DTaP/Tdap/Td vaccine (3 - Td or Tdap) 07/16/2031   Pneumococcal Vaccine for age over 38  Completed   Meningitis B Vaccine  Aged Out       12/28/2022   10:14 AM  Advanced Directives  Does Patient Have a Medical Advance Directive? Yes  Type of Estate Agent of [employer];Living will  Copy of Healthcare Power of Attorney in Chart? No - copy requested    Vision: Annual vision screenings are recommended for early detection of glaucoma, cataracts, and diabetic retinopathy. These exams can also reveal signs of chronic conditions such as diabetes and high blood pressure.  Dental: Annual dental screenings help detect early signs of oral cancer, gum disease, and other conditions linked to overall health,  including heart disease and diabetes.  Please see the attached documents for additional preventive care recommendations.

## 2024-01-01 ENCOUNTER — Ambulatory Visit: Payer: Self-pay | Admitting: Cardiovascular Disease

## 2024-01-01 ENCOUNTER — Telehealth (HOSPITAL_BASED_OUTPATIENT_CLINIC_OR_DEPARTMENT_OTHER): Payer: Self-pay | Admitting: *Deleted

## 2024-01-01 NOTE — Telephone Encounter (Signed)
 Patient called to schedule tele phone visit.

## 2024-01-01 NOTE — Telephone Encounter (Signed)
 Pt has been scheduled tele preop appt 01/10/24. Med rec and consent are done. Per pt procedure ill not be done until he has been cleared.

## 2024-01-01 NOTE — Telephone Encounter (Signed)
 Pt has been scheduled tele preop appt 01/10/24. Med rec and consent are done. Per pt procedure ill not be done until he has been cleared.      Patient Consent for Virtual Visit        MARC LEICHTER has provided verbal consent on 01/01/2024 for a virtual visit (video or telephone).   CONSENT FOR VIRTUAL VISIT FOR:  Tanda DELENA Huron  By participating in this virtual visit I agree to the following:  I hereby voluntarily request, consent and authorize [facility] and its employed or contracted physicians, physician assistants, nurse practitioners or other licensed health care professionals (the Practitioner), to provide me with telemedicine health care services (the "Services) as deemed necessary by the treating Practitioner. I acknowledge and consent to receive the Services by the Practitioner via telemedicine. I understand that the telemedicine visit will involve communicating with the Practitioner through live audiovisual communication technology and the disclosure of certain medical information by electronic transmission. I acknowledge that I have been given the opportunity to request an in-person assessment or other available alternative prior to the telemedicine visit and am voluntarily participating in the telemedicine visit.  I understand that I have the right to withhold or withdraw my consent to the use of telemedicine in the course of my care at any time, without affecting my right to future care or treatment, and that the Practitioner or I may terminate the telemedicine visit at any time. I understand that I have the right to inspect all information obtained and/or recorded in the course of the telemedicine visit and may receive copies of available information for a reasonable fee.  I understand that some of the potential risks of receiving the Services via telemedicine include:  Delay or interruption in medical evaluation due to technological equipment failure or  disruption; Information transmitted may not be sufficient (e.g. poor resolution of images) to allow for appropriate medical decision making by the Practitioner; and/or  In rare instances, security protocols could fail, causing a breach of personal health information.  Furthermore, I acknowledge that it is my responsibility to provide information about my medical history, conditions and care that is complete and accurate to the best of my ability. I acknowledge that Practitioner's advice, recommendations, and/or decision may be based on factors not within their control, such as incomplete or inaccurate data provided by me or distortions of diagnostic images or specimens that may result from electronic transmissions. I understand that the practice of medicine is not an exact science and that Practitioner makes no warranties or guarantees regarding treatment outcomes. I acknowledge that a copy of this consent can be made available to me via my patient portal [facility] MyChart), or I can request a printed copy by calling the office of [facility].    I understand that my insurance will be billed for this visit.   I have read or had this consent read to me. I understand the contents of this consent, which adequately explains the benefits and risks of the Services being provided via telemedicine.  I have been provided ample opportunity to ask questions regarding this consent and the Services and have had my questions answered to my satisfaction. I give my informed consent for the services to be provided through the use of telemedicine in my medical care

## 2024-01-09 ENCOUNTER — Other Ambulatory Visit: Payer: Self-pay | Admitting: Internal Medicine

## 2024-01-09 DIAGNOSIS — I480 Paroxysmal atrial fibrillation: Secondary | ICD-10-CM

## 2024-01-10 ENCOUNTER — Ambulatory Visit: Attending: Cardiology | Admitting: Nurse Practitioner

## 2024-01-10 DIAGNOSIS — Z01810 Encounter for preprocedural cardiovascular examination: Secondary | ICD-10-CM

## 2024-01-10 NOTE — Progress Notes (Signed)
 Virtual Visit via Telephone Note   Because of Keith [name] co-morbid illnesses, he is at least at moderate risk for complications without adequate follow up.  This format is felt to be most appropriate for this patient at this time.  Due to technical limitations with video connection (technology), today's appointment will be conducted as an audio only telehealth visit, and Keith [name] verbally agreed to proceed in this manner.   All issues noted in this document were discussed and addressed.  No physical exam could be performed with this format.  Evaluation Performed:  Preoperative cardiovascular risk assessment _____________   Date:  01/10/2024   Patient ID:  Keith [name], DOB [birth year], MRN [id number] Patient Location:  Home Provider location:   Office  Primary Care Provider:  Joshua Debby CROME, MD Primary Cardiologist:  Lynwood Schilling, MD  Chief Complaint / Patient Profile   83 y.o. y/o male with a h/o paroxysmal atrial fibrillation, hyperlipidemia, and hypothyroidism who is pending lumbar epidural with [facility] and presents today for telephonic preoperative cardiovascular risk assessment.  History of Present Illness    Keith [name] is a 83 y.o. male who presents via audio/video conferencing for a telehealth visit today.  Pt was last seen in cardiology clinic on 07/07/2023 by Dr. Schilling.  At that time Keith [name] was doing well.  The patient is now pending procedure as outlined above. Since his last visit, he has done well from a cardiac standpoint.   He denies chest pain, palpitations, dyspnea, pnd, orthopnea, n, v, dizziness, syncope, edema, weight gain, or early satiety. All other systems reviewed and are otherwise negative except as noted above.   Past Medical History    Past Medical History:  Diagnosis Date   Allergic rhinitis    Cancer (HCC)    SKIN CANCER ON LIP (30 YR AGO)   DDD (degenerative disc disease), cervical    Hyperlipidemia    Hypothyroidism     PONV (postoperative nausea and vomiting)    2016 BACK SURGERY, IN RECOVERY HE HAD N/V   Past Surgical History:  Procedure Laterality Date   AMPUTATED Right 1961   TRAUMATIC AMPUTATED FINGERS RIGHT    BACK SURGERY  2016   TONSILLECTOMY     TRANSFORAMINAL LUMBAR INTERBODY FUSION (TLIF) WITH PEDICLE SCREW FIXATION 1 LEVEL Right 11/29/2017   Procedure: RIGHT SIDED LUMBAR 4-5 TRANSFORAMINAL LUMBAR INTERBODY FUSION WITH INSTRUMENTATION AND ALLOGRAFT;  Surgeon: Beuford Anes, MD;  Location: [facility] OR;  Service: Orthopedics;  Laterality: Right;    Allergies  Allergies  Allergen Reactions   Atorvastatin Other (See Comments)    leg cramps   Rosuvastatin Other (See Comments)    myalgias    Home Medications    Prior to Admission medications   Medication Sig Start Date End Date Taking? Authorizing Provider  azelastine  (ASTELIN ) 0.1 % nasal spray Place 2 sprays into both nostrils 2 (two) times daily. Use in each nostril as directed 01/23/23   Joshua Debby CROME, MD  Boswellia-Glucosamine-Vit D (OSTEO BI-FLEX ONE PER DAY PO) Take by mouth.    [provider]  diltiazem  (CARDIZEM  CD) 120 MG 24 hr capsule TAKE 1 CAPSULE(120 MG) BY MOUTH DAILY 01/09/24   Joshua Debby CROME, MD  ferrous sulfate  324 MG TBEC Take 324 mg by mouth daily.    [provider]  fluticasone  (FLONASE ) 50 MCG/ACT nasal spray SHAKE LIQUID AND USE 2 SPRAYS IN EACH NOSTRIL DAILY Patient taking differently: Place 1 spray into both nostrils  as needed for allergies. 07/06/22   Joshua Debby CROME, MD  levothyroxine  (SYNTHROID ) 112 MCG tablet Take 1 tablet (112 mcg total) by mouth daily before breakfast. Annual appt due in Sept must see provider for future refills 07/12/23   Joshua Debby CROME, MD  linaclotide  (LINZESS ) 145 MCG CAPS capsule Take 1 capsule (145 mcg total) by mouth daily before breakfast for 7 days. 11/01/23 01/01/24  Purcell Emil Schanz, MD  magnesium  oxide (MAG-OX) 400 MG tablet Take 400 mg by mouth daily.     [provider]  Misc Natural Products ([employer] PROSTATE MAX + PO) Take 1 tablet by mouth 2 (two) times daily.    [provider]  Pitavastatin  Calcium  1 MG TABS TAKE 1 TABLET(1 MG) BY MOUTH DAILY 09/27/23   Joshua Debby CROME, MD  XARELTO  20 MG TABS tablet TAKE 1 TABLET(20 MG) BY MOUTH DAILY WITH SUPPER 12/06/23   Lavona Agent, MD    Physical Exam    Vital Signs:  Keith [name] does not have vital signs available for review today.  Given telephonic nature of communication, physical exam is limited. AAOx3. NAD. Normal affect.  Speech and respirations are unlabored.  Accessory Clinical Findings    None  Assessment & Plan    1.  Preoperative Cardiovascular Risk Assessment:  According to the Revised Cardiac Risk Index (RCRI), his Perioperative Risk of Major Cardiac Event is (%): 0.4. His Functional Capacity in METs is: 4.06 according to the Duke Activity Status Index (DASI).Therefore, based on ACC/AHA guidelines, patient would be at acceptable risk for the planned procedure without further cardiovascular testing.  The patient was advised that if he develops new symptoms prior to surgery to contact our office to arrange for a follow-up visit, and he verbalized understanding.  Per office protocol, patient can hold Xarelto  for 3 days prior to procedure.  Please resume Xarelto  as soon as possible postprocedure, at the discretion of the surgeon.    A copy of this note will be routed to requesting surgeon.  Time:   Today, I have spent 5 minutes with the patient with telehealth technology discussing medical history, symptoms, and management plan.     Keith JAYSON Braver, NP  01/10/2024, 2:07 PM

## 2024-01-11 ENCOUNTER — Ambulatory Visit: Admitting: Internal Medicine

## 2024-01-11 ENCOUNTER — Ambulatory Visit: Payer: Self-pay | Admitting: Internal Medicine

## 2024-01-11 ENCOUNTER — Encounter: Payer: Self-pay | Admitting: Internal Medicine

## 2024-01-11 VITALS — BP 126/72 | HR 57 | Temp 97.6°F | Ht 70.0 in | Wt 202.4 lb

## 2024-01-11 DIAGNOSIS — Z23 Encounter for immunization: Secondary | ICD-10-CM

## 2024-01-11 DIAGNOSIS — E039 Hypothyroidism, unspecified: Secondary | ICD-10-CM

## 2024-01-11 DIAGNOSIS — R7303 Prediabetes: Secondary | ICD-10-CM | POA: Insufficient documentation

## 2024-01-11 DIAGNOSIS — Z0001 Encounter for general adult medical examination with abnormal findings: Secondary | ICD-10-CM

## 2024-01-11 LAB — HEMOGLOBIN A1C: Hgb A1c MFr Bld: 5.9 % (ref 4.6–6.5)

## 2024-01-11 NOTE — Progress Notes (Signed)
 Subjective:  Patient ID: Keith [name], male    DOB: 1940-05-02  Age: 83 y.o. MRN: [id number]  CC: Paroxysmal Atrial Fibrillation (6 month follow up ), Hypothyroidism, Hyperlipidemia, Prediabetes, and Annual Exam   HPI Keith [name] presents for a CPX and f/up --  Discussed the use of AI scribe software for clinical note transcription with the patient, who gave verbal consent to proceed.  History of Present Illness Keith [name] is an 83 year old male who presents for follow-up care.  He has arthritis in the shoulder and spine, receiving corticosteroid injections in the shoulder every three months, which provide relief. He receives epidural injections in his back approximately every six months for spinal arthritis, with the next one planned in about two weeks.  He experiences neuropathy in the right leg, characterized by stiffness and numbness without pain. He is concerned about falling due to 'wobbling' but has not fallen yet. He takes B12 pills for this condition.  In early October, he experienced constipation and was given capsules by another doctor, which helped. He now takes them occasionally and reports regular bowel movements, with the last one occurring yesterday. No abdominal pain or cramping.  He mentions a low heart rate of 57 but denies feeling weak, dizzy, or lightheaded. He had a recent virtual appointment with a heart doctor. His last EKG was about a year ago.  He has stopped taking iron supplements due to constipation concerns and currently takes magnesium , among other vitamins. No symptoms of high blood sugar such as excessive thirst or urination.  He reports dry mouth and irritation, which he attributes to teeth problems.     Outpatient Medications Prior to Visit  Medication Sig Dispense Refill   azelastine  (ASTELIN ) 0.1 % nasal spray Place 2 sprays into both nostrils 2 (two) times daily. Use in each nostril as directed 90 mL 1   Boswellia-Glucosamine-Vit D  (OSTEO BI-FLEX ONE PER DAY PO) Take by mouth.     diltiazem  (CARDIZEM  CD) 120 MG 24 hr capsule TAKE 1 CAPSULE(120 MG) BY MOUTH DAILY 90 capsule 1   ferrous sulfate  324 MG TBEC Take 324 mg by mouth daily.     fluticasone  (FLONASE ) 50 MCG/ACT nasal spray SHAKE LIQUID AND USE 2 SPRAYS IN EACH NOSTRIL DAILY (Patient taking differently: Place 1 spray into both nostrils as needed for allergies.) 48 g 1   levothyroxine  (SYNTHROID ) 112 MCG tablet Take 1 tablet (112 mcg total) by mouth daily before breakfast. Annual appt due in Sept must see provider for future refills 90 tablet 1   linaclotide  (LINZESS ) 145 MCG CAPS capsule Take 1 capsule (145 mcg total) by mouth daily before breakfast for 7 days. 7 capsule 1   magnesium  oxide (MAG-OX) 400 MG tablet Take 400 mg by mouth daily.     Misc Natural Products ([employer]S PROSTATE MAX + PO) Take 1 tablet by mouth 2 (two) times daily.     Pitavastatin  Calcium  1 MG TABS TAKE 1 TABLET(1 MG) BY MOUTH DAILY 90 tablet 1   XARELTO  20 MG TABS tablet TAKE 1 TABLET(20 MG) BY MOUTH DAILY WITH SUPPER 30 tablet 11   No facility-administered medications prior to visit.    ROS Review of Systems  Constitutional:  Negative for appetite change, chills, diaphoresis, fatigue and fever.  HENT: Negative.  Negative for sore throat.   Eyes:  Negative for visual disturbance.  Respiratory:  Negative for cough, chest tightness, shortness of breath and wheezing.   Cardiovascular:  Negative for  chest pain, palpitations and leg swelling.  Gastrointestinal:  Positive for constipation. Negative for abdominal pain, blood in stool, diarrhea, nausea and vomiting.  Genitourinary: Negative.  Negative for difficulty urinating and dysuria.  Musculoskeletal:  Positive for arthralgias. Negative for joint swelling and myalgias.  Skin: Negative.  Negative for color change and pallor.  Neurological: Negative.  Negative for dizziness, weakness and light-headedness.  Hematological:  Negative for adenopathy.  Does not bruise/bleed easily.  Psychiatric/Behavioral: Negative.      Objective:  BP 126/72 (BP Location: Left Arm, Patient Position: Sitting, Cuff Size: Normal)   Pulse (!) 57   Temp 97.6 F (36.4 C) (Oral)   Ht 5' 10 (1.778 m)   Wt 202 lb 6.4 oz (91.8 kg)   SpO2 97%   BMI 29.04 kg/m   BP Readings from Last 3 Encounters:  01/11/24 126/72  11/28/23 120/80  11/23/23 100/68    Wt Readings from Last 3 Encounters:  01/11/24 202 lb 6.4 oz (91.8 kg)  12/29/23 202 lb (91.6 kg)  11/28/23 202 lb (91.6 kg)    Physical Exam Vitals reviewed.  Constitutional:      Appearance: Normal appearance.  HENT:     Nose: Nose normal.     Mouth/Throat:     Mouth: Mucous membranes are moist.  Eyes:     General: No scleral icterus.    Conjunctiva/sclera: Conjunctivae normal.  Cardiovascular:     Rate and Rhythm: Regular rhythm. Bradycardia present.     Heart sounds: S1 normal and S2 normal. No murmur heard.    No friction rub. No gallop.     Comments: He refused an EKG today Pulmonary:     Effort: Pulmonary effort is normal.     Breath sounds: No stridor. No wheezing, rhonchi or rales.  Abdominal:     General: Abdomen is flat.     Palpations: There is no mass.     Tenderness: There is no abdominal tenderness. There is no guarding.     Hernia: No hernia is present.  Musculoskeletal:        General: Normal range of motion.     Cervical back: Neck supple.     Right lower leg: No edema.     Left lower leg: No edema.  Lymphadenopathy:     Cervical: No cervical adenopathy.  Skin:    General: Skin is warm and dry.  Neurological:     General: No focal deficit present.     Mental Status: He is alert. Mental status is at baseline.  Psychiatric:        Mood and Affect: Mood normal.        Behavior: Behavior normal.     Lab Results  Component Value Date   WBC 8.2 11/01/2023   HGB 15.7 11/01/2023   HCT 47.6 11/01/2023   PLT 180.0 11/01/2023   GLUCOSE 97 11/01/2023   CHOL 144  07/12/2023   TRIG 130.0 07/12/2023   HDL 35.00 (L) 07/12/2023   LDLCALC 83 07/12/2023   ALT 20 11/01/2023   AST 22 11/01/2023   NA 140 11/01/2023   K 4.2 11/01/2023   CL 104 11/01/2023   CREATININE 1.04 11/01/2023   BUN 18 11/01/2023   CO2 27 11/01/2023   TSH 3.98 11/01/2023   PSA 3.28 03/16/2021   INR 1.01 11/27/2017   HGBA1C 5.9 01/11/2024    DG INJECT DIAG/THERA/INC NEEDLE/CATH/PLC EPI/LUMB/SAC W/IMG Result Date: 05/08/2023 CLINICAL DATA:  Lumbosacral spondylosis without myelopathy. 100% relief following the L5-S1 interlaminar  epidural injection on 11/22/2022. Recurrent left low back pain. FLUOROSCOPY: Radiation Exposure Index (as provided by the fluoroscopic device): 3.40 mGy Kerma PROCEDURE: The procedure, risks, benefits, and alternatives were explained to the patient. Questions regarding the procedure were encouraged and answered. The patient understands and consents to the procedure. LUMBAR EPIDURAL INJECTION: An interlaminar approach was performed on the left at L5-S1. The overlying skin was cleansed and anesthetized. A 3.5 inch 20 gauge epidural needle was advanced using loss-of-resistance technique. DIAGNOSTIC EPIDURAL INJECTION Injection of Isovue -M 200 shows a good epidural pattern with spread above and below the level of needle placement primarily on the left. No vascular opacification is seen. THERAPEUTIC EPIDURAL INJECTION: 80 mg of Depo-Medrol  mixed with 3 mL of 1% lidocaine  were instilled. The procedure was well-tolerated, and the patient was discharged thirty minutes following the injection in good condition. COMPLICATIONS: None immediate IMPRESSION: Technically successful interlaminar epidural injection on the left at L5-S1. Electronically Signed   By: Dasie Hamburg M.D.   On: 05/08/2023 12:49    Assessment & Plan:   Acquired hypothyroidism- He is euthyroid.  Prediabetes -     Hemoglobin A1c; Future  Encounter for general adult medical examination with abnormal  findings- Exam completed, labs reviewed, vaccines reviewed and updated, no cancer screenings indicated, pt ed material was given.   Need for prophylactic vaccination and inoculation against varicella -     Shingrix ; Inject 0.5 mLs into the muscle once for 1 dose.  Dispense: 0.5 mL; Refill: 1     Follow-up: Return in about 6 months (around 07/11/2024).  Debby Molt, MD

## 2024-01-11 NOTE — Patient Instructions (Signed)
 Health Maintenance, Male  Adopting a healthy lifestyle and getting preventive care are important in promoting health and wellness. Ask your health care provider about:  The right schedule for you to have regular tests and exams.  Things you can do on your own to prevent diseases and keep yourself healthy.  What should I know about diet, weight, and exercise?  Eat a healthy diet    Eat a diet that includes plenty of vegetables, fruits, low-fat dairy products, and lean protein.  Do not eat a lot of foods that are high in solid fats, added sugars, or sodium.  Maintain a healthy weight  Body mass index (BMI) is a measurement that can be used to identify possible weight problems. It estimates body fat based on height and weight. Your health care provider can help determine your BMI and help you achieve or maintain a healthy weight.  Get regular exercise  Get regular exercise. This is one of the most important things you can do for your health. Most adults should:  Exercise for at least 150 minutes each week. The exercise should increase your heart rate and make you sweat (moderate-intensity exercise).  Do strengthening exercises at least twice a week. This is in addition to the moderate-intensity exercise.  Spend less time sitting. Even light physical activity can be beneficial.  Watch cholesterol and blood lipids  Have your blood tested for lipids and cholesterol at 82 years of age, then have this test every 5 years.  You may need to have your cholesterol levels checked more often if:  Your lipid or cholesterol levels are high.  You are older than 82 years of age.  You are at high risk for heart disease.  What should I know about cancer screening?  Many types of cancers can be detected early and may often be prevented. Depending on your health history and family history, you may need to have cancer screening at various ages. This may include screening for:  Colorectal cancer.  Prostate cancer.  Skin cancer.  Lung  cancer.  What should I know about heart disease, diabetes, and high blood pressure?  Blood pressure and heart disease  High blood pressure causes heart disease and increases the risk of stroke. This is more likely to develop in people who have high blood pressure readings or are overweight.  Talk with your health care provider about your target blood pressure readings.  Have your blood pressure checked:  Every 3-5 years if you are 24-52 years of age.  Every year if you are 3 years old or older.  If you are between the ages of 60 and 72 and are a current or former smoker, ask your health care provider if you should have a one-time screening for abdominal aortic aneurysm (AAA).  Diabetes  Have regular diabetes screenings. This checks your fasting blood sugar level. Have the screening done:  Once every three years after age 66 if you are at a normal weight and have a low risk for diabetes.  More often and at a younger age if you are overweight or have a high risk for diabetes.  What should I know about preventing infection?  Hepatitis B  If you have a higher risk for hepatitis B, you should be screened for this virus. Talk with your health care provider to find out if you are at risk for hepatitis B infection.  Hepatitis C  Blood testing is recommended for:  Everyone born from [birth year] through 1964.  Anyone  with known risk factors for hepatitis C.  Sexually transmitted infections (STIs)  You should be screened each year for STIs, including gonorrhea and chlamydia, if:  You are sexually active and are younger than 82 years of age.  You are older than 82 years of age and your health care provider tells you that you are at risk for this type of infection.  Your sexual activity has changed since you were last screened, and you are at increased risk for chlamydia or gonorrhea. Ask your health care provider if you are at risk.  Ask your health care provider about whether you are at high risk for HIV. Your health care provider  may recommend a prescription medicine to help prevent HIV infection. If you choose to take medicine to prevent HIV, you should first get tested for HIV. You should then be tested every 3 months for as long as you are taking the medicine.  Follow these instructions at home:  Alcohol use  Do not drink alcohol if your health care provider tells you not to drink.  If you drink alcohol:  Limit how much you have to 0-2 drinks a day.  Know how much alcohol is in your drink. In the [place] one drink equals one 12 oz bottle of beer (355 mL), one 5 oz glass of wine (148 mL), or one 1 oz glass of hard liquor (44 mL).  Lifestyle  Do not use any products that contain nicotine or tobacco. These products include cigarettes, chewing tobacco, and vaping devices, such as e-cigarettes. If you need help quitting, ask your health care provider.  Do not use street drugs.  Do not share needles.  Ask your health care provider for help if you need support or information about quitting drugs.  General instructions  Schedule regular health, dental, and eye exams.  Stay current with your vaccines.  Tell your health care provider if:  You often feel depressed.  You have ever been abused or do not feel safe at home.  Summary  Adopting a healthy lifestyle and getting preventive care are important in promoting health and wellness.  Follow your health care provider's instructions about healthy diet, exercising, and getting tested or screened for diseases.  Follow your health care provider's instructions on monitoring your cholesterol and blood pressure.  This information is not intended to replace advice given to you by your health care provider. Make sure you discuss any questions you have with your health care provider.  Document Revised: 06/15/2020 Document Reviewed: 06/15/2020  Elsevier Patient Education  2024 [employer].

## 2024-01-13 MED ORDER — SHINGRIX 50 MCG/0.5ML IM SUSR: 0.5000 mL | Freq: Once | INTRAMUSCULAR | 1 refills | Status: DC

## 2024-01-13 MED ORDER — SHINGRIX 50 MCG/0.5ML IM SUSR: 0.5000 mL | Freq: Once | INTRAMUSCULAR | 1 refills | Status: AC

## 2024-01-16 ENCOUNTER — Encounter: Payer: Self-pay | Admitting: Family Medicine

## 2024-01-22 ENCOUNTER — Ambulatory Visit

## 2024-01-22 DIAGNOSIS — I480 Paroxysmal atrial fibrillation: Secondary | ICD-10-CM

## 2024-01-23 LAB — CUP PACEART REMOTE DEVICE CHECK
Date Time Interrogation Session: 20251214234522
Implantable Pulse Generator Implant Date: 20241219

## 2024-01-23 NOTE — Discharge Instructions (Signed)

## 2024-01-24 ENCOUNTER — Inpatient Hospital Stay: Admission: RE | Admit: 2024-01-24 | Discharge: 2024-01-24 | Attending: Family Medicine | Admitting: Family Medicine

## 2024-01-24 DIAGNOSIS — M5416 Radiculopathy, lumbar region: Secondary | ICD-10-CM

## 2024-01-24 MED ORDER — METHYLPREDNISOLONE ACETATE 40 MG/ML INJ SUSP (RADIOLOG
80.0000 mg | Freq: Once | INTRAMUSCULAR | Status: AC
  Administered 2024-01-24: 11:00:00 80 mg via EPIDURAL

## 2024-01-24 MED ORDER — IOPAMIDOL (ISOVUE-M 200) INJECTION 41%
1.0000 mL | Freq: Once | INTRAMUSCULAR | Status: AC
  Administered 2024-01-24: 11:00:00 1 mL via EPIDURAL

## 2024-01-25 ENCOUNTER — Encounter

## 2024-01-26 NOTE — Progress Notes (Signed)
 Remote Loop Recorder Transmission

## 2024-01-29 ENCOUNTER — Other Ambulatory Visit: Payer: Self-pay | Admitting: Internal Medicine

## 2024-01-29 DIAGNOSIS — E039 Hypothyroidism, unspecified: Secondary | ICD-10-CM

## 2024-02-03 ENCOUNTER — Ambulatory Visit: Payer: Self-pay | Admitting: Cardiovascular Disease

## 2024-02-20 ENCOUNTER — Ambulatory Visit: Admitting: Sports Medicine

## 2024-02-20 VITALS — HR 80 | Ht 70.0 in | Wt 202.0 lb

## 2024-02-20 DIAGNOSIS — M5441 Lumbago with sciatica, right side: Secondary | ICD-10-CM

## 2024-02-20 DIAGNOSIS — G8929 Other chronic pain: Secondary | ICD-10-CM

## 2024-02-20 DIAGNOSIS — M5416 Radiculopathy, lumbar region: Secondary | ICD-10-CM

## 2024-02-20 MED ORDER — KETOROLAC TROMETHAMINE 60 MG/2ML IM SOLN
60.0000 mg | Freq: Once | INTRAMUSCULAR | Status: AC
  Administered 2024-02-20: 60 mg via INTRAMUSCULAR

## 2024-02-20 MED ORDER — METHYLPREDNISOLONE ACETATE 80 MG/ML IJ SUSP
80.0000 mg | Freq: Once | INTRAMUSCULAR | Status: AC
  Administered 2024-02-20: 80 mg via INTRAMUSCULAR

## 2024-02-20 NOTE — Patient Instructions (Addendum)
 Tylenol  [phone] mg 2-3 times a day for pain relief   Voltaren gel over areas of pain   Epidural right L2-3  Full Cocktail injection today. No NSAIDs for 24 hours.   Follow up 2 weeks after to discuss results

## 2024-02-20 NOTE — Progress Notes (Signed)
 "               Keith [name] [facility] [address] [place] [address] Phone: [phone]   Assessment and Plan:     1. Lumbar radiculopathy (Primary) 2. Chronic bilateral low back pain with right-sided sciatica -Chronic with exacerbation, subsequent visit - Patient continuing to experience bilateral low back pain, primarily right-sided.  Most consistent with lumbar degenerative changes with history of fusion at L4-L5, causing intermittent flares of radiculopathy and persistent low back pain. - Patient has had significant benefit in the past from epidural CSI to left-sided L5-S1, however most recent epidural CSI on 01/24/2024 provided 0% relief.  Based on patient's physical exam today, I feel his symptoms are more consistent with degenerative changes at L2-L3 as seen on prior CT and x-ray imaging represent his primary pain generator leading to intermittent right sided radicular symptoms.  Recommend epidural CSI to right sided L2-L3 - Use Tylenol  500 to 1000 mg tablets 2-3 times a day for day-to-day pain relief - Do not recommend p.o. NSAIDs or prednisone  due to chronic anticoagulation on Xarelto , atrial fibrillation - Patient elected for IM injection of methylprednisone 80 mg/Toradol  60 mg.  Injection given in clinic today and tolerated well.     Pertinent previous records reviewed include lumbar x-ray 08/11/2022, CT lumbar spine 09/29/2020 epidural procedure note 01/24/2024   Follow Up: 2 weeks after epidural to review benefit.  Could consider repeat epidural if needed.  Could consider repeat MRI imaging versus physical therapy referral   Subjective:   I, Moenique Parris, am serving as a neurosurgeon for Doctor Morene Mace  Chief Complaint: low back pain   HPI:   02/20/2024 Patient is a 84 year old male with low back pain. Patient states started last Wednesday he was mopping his floors. Endorses antalgic gait. Doesn't think his epidural took. 0% relief  with that epidural. No numbness or tingling just pain.    Relevant Historical Information: PAF on chronic anticoagulation with Xarelto , hypothyroidism   Additional pertinent review of systems negative.  Current Medications[1]   Objective:     Vitals:   02/20/24 1414  Pulse: 80  SpO2: 99%  Weight: 202 lb (91.6 kg)  Height: 5' 10 (1.778 m)      Body mass index is 28.98 kg/m.    Physical Exam:    Gen: Appears well, nad, nontoxic and pleasant Psych: Alert and oriented, appropriate mood and affect Neuro: sensation intact, strength is 5/5 in upper and lower extremities, muscle tone wnl Skin: no susupicious lesions or rashes  Back - Normal skin, Spine with normal alignment and no deformity.   No tenderness to vertebral process palpation.   Right lumbar paraspinous muscles are   tender and without spasm with maximal tenderness at L2-L3 NTTP gluteal musculature Straight leg raise negative left, positive right Gait antalgic, hunched forward with slow and shortened steps   Electronically signed by:  Keith [name] [facility] 2:31 PM 02/20/2024     [1]  Current Outpatient Medications:    azelastine  (ASTELIN ) 0.1 % nasal spray, Place 2 sprays into both nostrils 2 (two) times daily. Use in each nostril as directed, Disp: 90 mL, Rfl: 1   Boswellia-Glucosamine-Vit D (OSTEO BI-FLEX ONE PER DAY PO), Take by mouth., Disp: , Rfl:    diltiazem  (CARDIZEM  CD) 120 MG 24 hr capsule, TAKE 1 CAPSULE(120 MG) BY MOUTH DAILY, Disp: 90 capsule, Rfl: 1   ferrous sulfate  324 MG  TBEC, Take 324 mg by mouth daily., Disp: , Rfl:    fluticasone  (FLONASE ) 50 MCG/ACT nasal spray, SHAKE LIQUID AND USE 2 SPRAYS IN EACH NOSTRIL DAILY (Patient taking differently: Place 1 spray into both nostrils as needed for allergies.), Disp: 48 g, Rfl: 1   levothyroxine  (SYNTHROID ) 112 MCG tablet, TAKE 1 TABLET BY MOUTH EVERY DAY BEFORE BREAKFAST, Disp: 90 tablet, Rfl: 1   linaclotide  (LINZESS ) 145  MCG CAPS capsule, Take 1 capsule (145 mcg total) by mouth daily before breakfast for 7 days., Disp: 7 capsule, Rfl: 1   magnesium  oxide (MAG-OX) 400 MG tablet, Take 400 mg by mouth daily., Disp: , Rfl:    Misc Natural Products ([employer] PROSTATE MAX + PO), Take 1 tablet by mouth 2 (two) times daily., Disp: , Rfl:    Pitavastatin  Calcium  1 MG TABS, TAKE 1 TABLET(1 MG) BY MOUTH DAILY, Disp: 90 tablet, Rfl: 1   XARELTO  20 MG TABS tablet, TAKE 1 TABLET(20 MG) BY MOUTH DAILY WITH SUPPER, Disp: 30 tablet, Rfl: 11  Current Facility-Administered Medications:    ketorolac  (TORADOL ) injection 60 mg, 60 mg, Intramuscular, Once, Leonce Katz, DO   methylPREDNISolone  acetate (DEPO-MEDROL ) injection 80 mg, 80 mg, Intramuscular, Once, Leonce Katz, DO  "

## 2024-02-22 ENCOUNTER — Ambulatory Visit

## 2024-02-22 ENCOUNTER — Ambulatory Visit: Admitting: Sports Medicine

## 2024-02-22 DIAGNOSIS — I480 Paroxysmal atrial fibrillation: Secondary | ICD-10-CM | POA: Diagnosis not present

## 2024-02-22 LAB — CUP PACEART REMOTE DEVICE CHECK
Date Time Interrogation Session: 20260114233849
Implantable Pulse Generator Implant Date: 20241219

## 2024-02-24 ENCOUNTER — Ambulatory Visit: Payer: Self-pay | Admitting: Cardiovascular Disease

## 2024-02-28 ENCOUNTER — Telehealth (HOSPITAL_BASED_OUTPATIENT_CLINIC_OR_DEPARTMENT_OTHER): Payer: Self-pay

## 2024-02-28 NOTE — Telephone Encounter (Signed)
"  ° °  Patient Name: Keith [name]  DOB: [birth year] MRN: [id number]  Primary Cardiologist: Lynwood Schilling, MD  Chart reviewed as part of pre-operative protocol coverage. Last in office OV 06/2023. Recently had virtual visit 01/10/24 for injection clearance, felt OK to proceed at that time. I spoke with the patient by phone since last clearance <2 months prior. He reports he had that injection and this will be a follow-up. The patient affirms he has been doing well without any new cardiac or neurologic symptoms. Therefore, based on ACC/AHA guidelines, the patient would be at acceptable risk for the planned procedure without further cardiovascular testing. The patient was advised that if he develops new symptoms prior to surgery to contact our office to arrange for a follow-up visit, and he verbalized understanding.  Given the type of procedure, we would recommend holding Xarelto  for 3 days prior to procedure (rather than 2 doses). Relayed to patient as well. He inquired about procedure date; I told him to discuss with the team arranging his injection.  Will route this bundled recommendation to requesting provider via Epic fax function. Please call with questions.  Shadee Montoya N Delberta Folts, PA-C 02/28/2024, 10:36 AM   "

## 2024-02-28 NOTE — Telephone Encounter (Signed)
"  ° °  Pre-operative Risk Assessment    Patient Name: Keith [name]  DOB: 26-Oct-1940 MRN: [id number]   Date of last office visit: Telephone PreOp visit 01/10/2024 with Damien Braver, NP and In-Person OV 07/07/2023 with Dr. Lavona Date of next office visit: None  Request for Surgical Clearance    Procedure:  Lumbar epidural  Date of Surgery:  Clearance TBS -URGENT                                 Surgeon:  Does not specify Surgeon's Group or Practice Name:  [facility] Phone number:  [phone] Fax number:  [phone]   Type of Clearance Requested:   - Medical  - Pharmacy:  Hold Rivaroxaban  (Xarelto ) 2 doses prior   Type of Anesthesia:  Does not specify   Additional requests/questions:  None  Signed, Patrcia Iverson CROME   02/28/2024, 10:09 AM   "

## 2024-03-01 ENCOUNTER — Encounter: Payer: Self-pay | Admitting: Sports Medicine

## 2024-03-01 NOTE — Progress Notes (Signed)
 Remote Loop Recorder Transmission

## 2024-03-06 ENCOUNTER — Other Ambulatory Visit

## 2024-03-08 ENCOUNTER — Telehealth: Payer: Self-pay

## 2024-03-08 NOTE — Telephone Encounter (Signed)
 I need more information for insurance to approve the back injection.   Most recent Pain Scale? Could not find this in notes, I am guessing 6+ out of 10 is the norm when you order these injections for patients.    Failure of 4 weeks of non-surgical, non-injection care, which includes appropriate physical therapy to extent tolerated. I do not see recent PT or HEP for low back, can patient even tolerate PT? I can also supply a low back HEP stating patient has been doing these since the pain started 3 years ago, bu can no longer tolerate.   Documentation of active/ongoing rehabilitation program, home exercise program or functional restoration program specifically for Lumbosacral Area. Again I do not see that this was done but I am sure this patient has done HEP for low back at some point. If patient cannot tolerate Hep now, I can state that once patient can tolerate the plan is to start the HEP back slowly.    PCP notification regarding continuation of procedures and prolonged repeat steroid use. I do not see this in any notes, so I will need acknowledgment of this and that the benefits outweigh the risks.

## 2024-03-08 NOTE — Telephone Encounter (Signed)
 Letter written, insurance denied so quickly that I will be sending it as an appeal letter instead of adding it to the pending order.

## 2024-03-12 NOTE — Discharge Instructions (Signed)

## 2024-03-13 ENCOUNTER — Inpatient Hospital Stay: Admission: RE | Admit: 2024-03-13 | Source: Ambulatory Visit

## 2024-03-20 ENCOUNTER — Other Ambulatory Visit

## 2024-03-24 ENCOUNTER — Encounter

## 2024-04-24 ENCOUNTER — Encounter

## 2024-05-25 ENCOUNTER — Encounter

## 2024-06-25 ENCOUNTER — Encounter

## 2024-07-26 ENCOUNTER — Encounter
